# Patient Record
Sex: Male | Born: 1935 | Race: Black or African American | Hispanic: No | Smoking: Never smoker
Health system: Southern US, Community
[De-identification: ages and names within clinical notes are randomized; demographics above are authoritative.]

## PROBLEM LIST (undated history)

## (undated) DIAGNOSIS — E785 Hyperlipidemia, unspecified: Secondary | ICD-10-CM

## (undated) DIAGNOSIS — E119 Type 2 diabetes mellitus without complications: Secondary | ICD-10-CM

## (undated) DIAGNOSIS — I639 Cerebral infarction, unspecified: Secondary | ICD-10-CM

## (undated) DIAGNOSIS — H5440 Blindness, one eye, unspecified eye: Secondary | ICD-10-CM

---

## 1997-08-16 ENCOUNTER — Emergency Department (HOSPITAL_COMMUNITY): Admission: EM | Admit: 1997-08-16 | Discharge: 1997-08-16 | Payer: Self-pay | Admitting: Emergency Medicine

## 1997-08-24 ENCOUNTER — Emergency Department (HOSPITAL_COMMUNITY): Admission: EM | Admit: 1997-08-24 | Discharge: 1997-08-24 | Payer: Self-pay | Admitting: Emergency Medicine

## 2005-07-19 ENCOUNTER — Emergency Department (HOSPITAL_COMMUNITY): Admission: EM | Admit: 2005-07-19 | Discharge: 2005-07-19 | Payer: Self-pay | Admitting: Emergency Medicine

## 2009-02-07 ENCOUNTER — Ambulatory Visit: Payer: Self-pay | Admitting: Vascular Surgery

## 2009-02-07 ENCOUNTER — Inpatient Hospital Stay (HOSPITAL_COMMUNITY): Admission: EM | Admit: 2009-02-07 | Discharge: 2009-02-09 | Payer: Self-pay | Admitting: Emergency Medicine

## 2009-02-07 ENCOUNTER — Encounter (INDEPENDENT_AMBULATORY_CARE_PROVIDER_SITE_OTHER): Payer: Self-pay | Admitting: Internal Medicine

## 2010-04-01 LAB — URINALYSIS, ROUTINE W REFLEX MICROSCOPIC
Bilirubin Urine: NEGATIVE
Hgb urine dipstick: NEGATIVE
Nitrite: NEGATIVE
Protein, ur: NEGATIVE mg/dL
Specific Gravity, Urine: 1.016 (ref 1.005–1.030)
Urobilinogen, UA: 1 mg/dL (ref 0.0–1.0)

## 2010-04-01 LAB — CARDIAC PANEL(CRET KIN+CKTOT+MB+TROPI)
Relative Index: INVALID (ref 0.0–2.5)
Relative Index: INVALID (ref 0.0–2.5)
Troponin I: 0.01 ng/mL (ref 0.00–0.06)
Troponin I: 0.01 ng/mL (ref 0.00–0.06)

## 2010-04-01 LAB — POCT CARDIAC MARKERS: Myoglobin, poc: 98.9 ng/mL (ref 12–200)

## 2010-04-01 LAB — GLUCOSE, CAPILLARY
Glucose-Capillary: 114 mg/dL — ABNORMAL HIGH (ref 70–99)
Glucose-Capillary: 139 mg/dL — ABNORMAL HIGH (ref 70–99)
Glucose-Capillary: 143 mg/dL — ABNORMAL HIGH (ref 70–99)
Glucose-Capillary: 149 mg/dL — ABNORMAL HIGH (ref 70–99)
Glucose-Capillary: 162 mg/dL — ABNORMAL HIGH (ref 70–99)
Glucose-Capillary: 164 mg/dL — ABNORMAL HIGH (ref 70–99)

## 2010-04-01 LAB — LIPID PANEL
Cholesterol: 150 mg/dL (ref 0–200)
HDL: 32 mg/dL — ABNORMAL LOW (ref >39)
LDL Cholesterol: 99 mg/dL (ref 0–99)
Total CHOL/HDL Ratio: 4.5 RATIO
Total CHOL/HDL Ratio: 4.6 RATIO
Triglycerides: 79 mg/dL (ref <150)
VLDL: 16 mg/dL (ref 0–40)

## 2010-04-01 LAB — DIFFERENTIAL
Basophils Absolute: 0 10*3/uL (ref 0.0–0.1)
Basophils Relative: 0 % (ref 0–1)
Eosinophils Relative: 1 % (ref 0–5)
Monocytes Absolute: 0.4 10*3/uL (ref 0.1–1.0)
Monocytes Relative: 10 % (ref 3–12)
Neutro Abs: 3.3 10*3/uL (ref 1.7–7.7)

## 2010-04-01 LAB — CBC
HCT: 43.6 % (ref 39.0–52.0)
Hemoglobin: 14.6 g/dL (ref 13.0–17.0)
RBC: 5.16 MIL/uL (ref 4.22–5.81)
RDW: 14.2 % (ref 11.5–15.5)
WBC: 4.6 10*3/uL (ref 4.0–10.5)

## 2010-04-01 LAB — HEMOGLOBIN A1C: Mean Plasma Glucose: 163 mg/dL

## 2010-04-01 LAB — BASIC METABOLIC PANEL
Chloride: 101 mEq/L (ref 96–112)
GFR calc Af Amer: >60 mL/min (ref >60)
Potassium: 4.2 mEq/L (ref 3.5–5.1)

## 2010-04-01 LAB — TSH: TSH: 0.851 u[IU]/mL (ref 0.350–4.500)

## 2010-10-25 ENCOUNTER — Other Ambulatory Visit: Payer: Self-pay | Admitting: Emergency Medicine

## 2010-10-25 DIAGNOSIS — R609 Edema, unspecified: Secondary | ICD-10-CM

## 2010-10-26 ENCOUNTER — Ambulatory Visit
Admission: RE | Admit: 2010-10-26 | Discharge: 2010-10-26 | Disposition: A | Discharge status: Home / Self Care | Payer: Medicare Other | Source: Ambulatory Visit | Attending: Emergency Medicine | Admitting: Emergency Medicine

## 2010-10-26 DIAGNOSIS — R609 Edema, unspecified: Secondary | ICD-10-CM

## 2010-10-29 ENCOUNTER — Other Ambulatory Visit: Payer: Self-pay

## 2010-10-30 ENCOUNTER — Ambulatory Visit
Admission: RE | Admit: 2010-10-30 | Discharge: 2010-10-30 | Disposition: A | Discharge status: Home / Self Care | Payer: Medicare Other | Source: Ambulatory Visit | Attending: Emergency Medicine | Admitting: Emergency Medicine

## 2010-10-30 DIAGNOSIS — R609 Edema, unspecified: Secondary | ICD-10-CM

## 2011-03-26 ENCOUNTER — Ambulatory Visit (INDEPENDENT_AMBULATORY_CARE_PROVIDER_SITE_OTHER): Payer: Medicare Other | Admitting: Emergency Medicine

## 2011-03-26 ENCOUNTER — Encounter: Payer: Self-pay | Admitting: Emergency Medicine

## 2011-03-26 DIAGNOSIS — G629 Polyneuropathy, unspecified: Secondary | ICD-10-CM

## 2011-03-26 DIAGNOSIS — E785 Hyperlipidemia, unspecified: Secondary | ICD-10-CM

## 2011-03-26 DIAGNOSIS — E782 Mixed hyperlipidemia: Secondary | ICD-10-CM

## 2011-03-26 DIAGNOSIS — E119 Type 2 diabetes mellitus without complications: Secondary | ICD-10-CM

## 2011-03-26 DIAGNOSIS — I639 Cerebral infarction, unspecified: Secondary | ICD-10-CM | POA: Insufficient documentation

## 2011-03-26 DIAGNOSIS — R269 Unspecified abnormalities of gait and mobility: Secondary | ICD-10-CM

## 2011-03-26 DIAGNOSIS — I10 Essential (primary) hypertension: Secondary | ICD-10-CM

## 2011-03-26 LAB — GLUCOSE, POCT (MANUAL RESULT ENTRY): POC Glucose: 132

## 2011-03-26 NOTE — Progress Notes (Signed)
  Subjective:    Patient ID: Curtis Brock, male    DOB: 07/03/1925, 78 y.o.   MRN: [id number]  HPI patient to followup on his diabetes. He also has high blood pressure and high cholesterol. He has had a previous mini stroke in the past. He does have difficulty with gait and he walks with a cane.    Review of Systems  Constitutional: Negative.   Eyes:       He is under the care of ophthalmologist for his eyes. He is extremely poor vision.  Respiratory: Negative for cough, chest tightness and shortness of breath.   Cardiovascular: Positive for leg swelling. Negative for chest pain and palpitations.  Gastrointestinal: Negative.   Genitourinary: Negative.   Neurological:       He has a lot of difficulty with his gait. He is unsteady and uses a cane for support.       Objective:   Physical Exam  Eyes:       He has some swelling around the left eye and tries to keep his left eye closed.  Neck: No tracheal deviation present. No thyromegaly present.  Cardiovascular: Normal rate, normal heart sounds and intact distal pulses.  Exam reveals no gallop and no friction rub.   No murmur heard. Pulmonary/Chest: No respiratory distress. He has no wheezes. He has no rales.  Abdominal: He exhibits no distension. There is no tenderness.  Lymphadenopathy:    He has no cervical adenopathy.   eburnation of his feet reveals pulses to be 1+. He has decreased sensation of both feet consistent with neuropathy. There is no swelling noted.        Assessment & Plan:  Patient did relatively well regarding his diabetes. He is having significant problems but is followed by Dr. Zadie Rhine and a second specialist for his eye care. His hemoglobin A1c was 7 which is reasonable for him.

## 2011-04-03 ENCOUNTER — Telehealth: Payer: Self-pay

## 2011-04-03 NOTE — Telephone Encounter (Signed)
Merry Proud from [facility] would like for Dr Everlene Farrier to contact an Occupational therapist for a consult his family is requesting this ot consult please contact Merry Proud at [phone]

## 2011-04-03 NOTE — Telephone Encounter (Signed)
Pt just saw the [facility] therapist and [facility] [place]is called to state that she would like to talk with dr Everlene Farrier about referring patient to a neurologist and getting him an rx for a new pair of diabetic shoes

## 2011-04-04 NOTE — Telephone Encounter (Signed)
Dr. Everlene Farrier, would you like to order this?

## 2011-04-04 NOTE — Telephone Encounter (Signed)
.   please go ahead and make the referral.

## 2011-04-05 NOTE — Telephone Encounter (Signed)
Gave Curtis Brock/PT from [facility] verbal order for OT eval and treat. Dr Everlene Farrier, do you think pt could benefit from a neuro referral, and if so, can you please order referral (I can not do this in Epic). Also pt needs Rx for diabetic shoes.

## 2011-04-06 ENCOUNTER — Other Ambulatory Visit: Payer: Self-pay | Admitting: Emergency Medicine

## 2011-04-06 DIAGNOSIS — R269 Unspecified abnormalities of gait and mobility: Secondary | ICD-10-CM

## 2011-04-06 NOTE — Telephone Encounter (Signed)
I will put him and orders only for OT. And I will put in an order for a neuro evaluation. I do not know how to order his diabetic shoes.

## 2011-04-08 NOTE — Telephone Encounter (Signed)
Spoke with daughter, Cherene Julian, and told her we had done referrals for OT and neuro and asked her where pt was planning to get DM shoes. She stated that she didn't know that she was told we could write Rx and then tell her where we could get them. Called [facility] and asked how we can Rx shoes for pt. [facility] gave name of [facility] who said they just need a script faxed and they will call pt to set up appt. Faxed script to [facility] with pts phone numbers and [facility] for pt/daughter to [facility]

## 2011-04-09 NOTE — Telephone Encounter (Signed)
LMOM to CB on home and cell #s

## 2011-04-10 NOTE — Telephone Encounter (Signed)
Curtis Brock and I gave her info and phone number for [facility] so that she can call them.

## 2011-04-25 ENCOUNTER — Telehealth: Payer: Self-pay

## 2011-04-25 NOTE — Telephone Encounter (Unsigned)
Curtis Brock IS Joson'S DAUGHTER.  SHE IS WAITING ON A REFERRAL FOR A NEUROLOGIST.  SAYS SHE'S BEEN WAITING TWO WEEKS.  BEST NUMBER IS [id number].

## 2011-05-23 ENCOUNTER — Emergency Department (HOSPITAL_COMMUNITY)
Admission: EM | Admit: 2011-05-23 | Discharge: 2011-05-23 | Disposition: A | Discharge status: Home / Self Care | Payer: Medicare Other | Attending: Emergency Medicine | Admitting: Emergency Medicine

## 2011-05-23 DIAGNOSIS — R000 Tachycardia, unspecified: Secondary | ICD-10-CM | POA: Insufficient documentation

## 2011-05-23 DIAGNOSIS — R5381 Other malaise: Secondary | ICD-10-CM | POA: Insufficient documentation

## 2011-05-23 DIAGNOSIS — E860 Dehydration: Secondary | ICD-10-CM | POA: Insufficient documentation

## 2011-05-23 DIAGNOSIS — H5789 Other specified disorders of eye and adnexa: Secondary | ICD-10-CM | POA: Insufficient documentation

## 2011-05-23 DIAGNOSIS — R531 Weakness: Secondary | ICD-10-CM

## 2011-05-23 LAB — CBC
MCH: 27.7 pg (ref 26.0–34.0)
MCHC: 33.6 g/dL (ref 30.0–36.0)
Platelets: 190 10*3/uL (ref 150–400)

## 2011-05-23 LAB — BASIC METABOLIC PANEL
BUN: 10 mg/dL (ref 6–23)
Calcium: 9.4 mg/dL (ref 8.4–10.5)
GFR calc Af Amer: 69 mL/min — ABNORMAL LOW (ref >90)
GFR calc non Af Amer: 59 mL/min — ABNORMAL LOW (ref >90)
Potassium: 3.9 mEq/L (ref 3.5–5.1)
Sodium: 138 mEq/L (ref 135–145)

## 2011-05-23 LAB — DIFFERENTIAL
Basophils Relative: 0 % (ref 0–1)
Eosinophils Absolute: 0.1 10*3/uL (ref 0.0–0.7)
Monocytes Relative: 6 % (ref 3–12)
Neutrophils Relative %: 75 % (ref 43–77)

## 2011-05-23 LAB — URINALYSIS, ROUTINE W REFLEX MICROSCOPIC
Glucose, UA: NEGATIVE mg/dL
Hgb urine dipstick: NEGATIVE
Leukocytes, UA: NEGATIVE
pH: 5.5 (ref 5.0–8.0)

## 2011-05-23 LAB — POCT I-STAT TROPONIN I: Troponin i, poc: 0 ng/mL (ref 0.00–0.08)

## 2011-05-23 MED ORDER — SODIUM CHLORIDE 0.9 % IV BOLUS (SEPSIS)
1000.0000 mL | Freq: Once | INTRAVENOUS | Status: AC
  Administered 2011-05-23 (×2): 1000 mL via INTRAVENOUS

## 2011-05-23 NOTE — ED Notes (Signed)
Spoke with patient's daughter, [name] at [phone] at patient's request. Daughter advised her father is in the [facility] for near syncopal episode.

## 2011-05-23 NOTE — ED Notes (Signed)
Per EMS- patient was picked up [place]. Patient states no LOC and slumped over in car.and diaphortic Patient stated he was feeling weak and denied chest pain and denies SOB. Stroke scale negative. A& O x 4. BP 110/76 HR 108. 99% RA. CBG 177, 12 Lead showing sinus tach. No allergies.

## 2011-05-23 NOTE — ED Notes (Signed)
EDP advised I-Stat troponin results are 0.0.

## 2011-05-23 NOTE — ED Provider Notes (Signed)
History     CSN: [id number]  Arrival date & time 05/23/11  0904   First MD Initiated Contact with Patient 05/23/11 [phone]      Chief Complaint  Patient presents with  . Loss of Consciousness    (Consider location/radiation/quality/duration/timing/severity/associated sxs/prior treatment) HPI History provided by patient and EMS.  Pt reports that he took the bus downtown today and walked all the way down to the jail to bail out his son.  After leaving the jail (w/out his son), a security guard offered to drive him back to the bus station, but he suddenly became generally weak, and was unable to climb into the car.  Per EMS, the security guard helped him into the car and when they arrived, he was slumped over in the back seat.  Pt denies LOC.  He had no weakness prior to attempting to get into car, and feels that he has regained some of his strength since.  Denies CP, SOB, palpitations, dizziness, paresthesias.  No recent illness including fever, cough, N/V/D, urinary sx.  No new medication changes and has been compliant w/ meds.  Had not had anything to eat this morning.   No past medical history on file.  No past surgical history on file.  No family history on file.  History  Substance Use Topics  . Smoking status: Never Smoker   . Smokeless tobacco: Not on file  . Alcohol Use: Not on file      Review of Systems  All other systems reviewed and are negative.    Allergies  Review of patient's allergies indicates no known allergies.  Home Medications   Current Outpatient Rx  Name Route Sig Dispense Refill  . ASPIRIN EC 81 MG PO TBEC Oral Take 81 mg by mouth daily.    [name] BACITRACIN-POLYMYXIN B 500-10000 UNIT/GM OP OINT Left Eye Place 1 application into the left eye 3 (three) times daily. apply to eye every 12 hours while awake    . GABAPENTIN 100 MG PO CAPS Oral Take 200 mg by mouth 2 (two) times daily.     [name] LOSARTAN POTASSIUM 50 MG PO TABS Oral Take 50 mg by mouth daily.    [name]  METFORMIN HCL 500 MG PO TABS Oral Take 500 mg by mouth 2 (two) times daily with a meal.    . MOXIFLOXACIN HCL 0.5 % OP SOLN Both Eyes Place 1 drop into both eyes 4 (four) times daily.    [name] SIMVASTATIN 20 MG PO TABS Oral Take 20 mg by mouth every evening.    [name] SODIUM CHLORIDE (HYPERTONIC) 5 % OP SOLN Left Eye Place 1 drop into the left eye 4 (four) times daily.      BP 103/51  Pulse 104  Temp(Src) 97.5 F (36.4 C) (Oral)  Resp 16  SpO2 95%  Physical Exam  Nursing note and vitals reviewed. Constitutional: He is oriented to person, place, and time. He appears well-developed and well-nourished. No distress.  HENT:  Head: Normocephalic and atraumatic.  Mouth/Throat: Oropharynx is clear and moist.  Eyes:       Drainage from right eye  Neck: Normal range of motion.  Cardiovascular: Normal rate and regular rhythm.   Pulmonary/Chest: Effort normal and breath sounds normal. No respiratory distress.  Abdominal: Soft. Bowel sounds are normal. He exhibits no distension. There is no tenderness.  Genitourinary:       No CVA tenderness  Musculoskeletal: Normal range of motion.       No peripheral  edema or calf ttp.  Pt is able to pull himself up to sitting position and move all four extremities independently.  5/5 plantar flexion and grip strength.   Neurological: He is alert and oriented to person, place, and time.  Skin: Skin is warm and dry. No rash noted.       Sluggish cap refill  Psychiatric: He has a normal mood and affect. His behavior is normal.    ED Course  Procedures (including critical care time)   Date: 05/23/2011  Rate: 96   Rhythm: normal sinus rhythm  QRS Axis: normal  Intervals: normal  ST/T Wave abnormalities: normal  Conduction Disutrbances:none  Narrative Interpretation:   Old EKG Reviewed: no significant changes   Labs Reviewed  BASIC METABOLIC PANEL - Abnormal; Notable for the following:    Glucose, Bld 189 (*)    GFR calc non Af Amer 59 (*)    GFR calc Af  Amer 69 (*)    All other components within normal limits  URINALYSIS, ROUTINE W REFLEX MICROSCOPIC - Abnormal; Notable for the following:    Color, Urine AMBER (*) BIOCHEMICALS MAY BE AFFECTED BY COLOR   Bilirubin Urine SMALL (*)    Ketones, ur 15 (*)    All other components within normal limits  CBC  DIFFERENTIAL  POCT I-STAT TROPONIN I   No results found.   1. Dehydration   2. Weakness       MDM  78yo M presents w/ acute generalized weakness. Denies syncope and weakness is currently improved. On exam, A&O, afebrile, mildly tachycardic, orthostatic, no focal neuro deficits, full active ROM all extremities.  Pt receiving IV fluids.  He has eaten and continues to feel better.    Orthostatics rechecked after 2L and HR increases but BP does not drop.  Pt ambulated around ED w/ his cane.  Gait nml and pt denies weakness/lightheadedness.  D/c'd home.  Recommended oral hydration. Return precautions discussed.         Remer Macho, [place] 05/23/11 1553

## 2011-05-23 NOTE — Discharge Instructions (Signed)
Drink plenty of fluids and eat meals regularly when you return home.  You should return to the ER if you have a passing out spell or develop chest pain, shortness of breath or racing/skipping heart beat. Dehydration, Elderly Dehydration is when you lose more fluids from the body than you take in. Vital organs such as the kidneys, brain, and heart cannot function without a proper amount of fluids and salt. Any loss of fluids from the body can cause dehydration.  Older adults are at a higher risk of dehydration than younger adults. As we age, our bodies are less able to conserve water and do not respond to temperature changes as well. Also, older adults do not become thirsty as easily or quickly. Because of this, older adults often do not realize they need to increase fluids to avoid dehydration.  CAUSES   Vomiting.   Diarrhea.   Excessive sweating.   Excessive urine output.   Fever.  SYMPTOMS  Mild dehydration  Thirst.   Dry lips.   Slightly dry mouth.  Moderate dehydration  Very dry mouth.   Sunken eyes.   Skin does not bounce back quickly when lightly pinched and released.   Dark urine and decreased urine production.   Decreased tear production.   Headache.  Severe dehydration  Very dry mouth.   Extreme thirst.   Rapid, weak pulse (more than 100 beats per minute at rest).   Cold hands and feet.   Not able to sweat in spite of heat.   Rapid breathing.   Blue lips.   Confusion and lethargy.   Difficulty being awakened.   Minimal urine production.   No tears.  DIAGNOSIS  Your caregiver will diagnose dehydration based on your symptoms and your exam. Blood and urine tests will help confirm the diagnosis. The diagnostic evaluation should also identify the cause of dehydration. TREATMENT  Treatment of mild or moderate dehydration can often be done at home by increasing the amount of fluids that you drink. It is best to drink small amounts of fluid more often.  Drinking too much at one time can make vomiting worse.  Severe dehydration needs to be treated at the hospital where you will probably be given intravenous (IV) fluids that contain water and electrolytes. HOME CARE INSTRUCTIONS   Ask your caregiver about specific rehydration instructions.   Drink enough fluids to keep your urine clear or pale yellow.   Drink small amounts frequently if you have nausea and vomiting.   Eat as you normally do.   Avoid:   Foods or drinks high in sugar.   Carbonated drinks.   Juice.   Extremely hot or cold fluids.   Drinks with caffeine.   Fatty, greasy foods.   Alcohol.   Tobacco.   Overeating.   Gelatin desserts.   Wash your hands well to avoid spreading bacteria and viruses.   Only take over-the-counter or prescription medicines for pain, discomfort, or fever as directed by your caregiver.   Ask your caregiver if you should continue all prescribed and over-the-counter medicines.   Keep all follow-up appointments with your caregiver.  SEEK MEDICAL CARE IF:  You have abdominal pain and it increases or stays in one area (localizes).   You have a rash, stiff neck, or severe headache.   You are irritable, sleepy, or difficult to awaken.   You are weak, dizzy, or extremely thirsty.  SEEK IMMEDIATE MEDICAL CARE IF:   You are unable to keep fluids down, or you  get worse despite treatment.   You have frequent episodes of vomiting or diarrhea.   You have blood or green matter (bile) in your vomit.   You have blood in your stool or your stool looks black and tarry.   You have not urinated in 6 to 8 hours, or you have only urinated a small amount of very dark urine.   You have a fever.   You faint.  MAKE SURE YOU:   Understand these instructions.   Will watch your condition.   Will get help right away if you are not doing well or get worse.  Document Released: 03/23/2003 Document Revised: 12/20/2010 Document Reviewed:  08/20/2010 [facility] Patient Information 2012 [place].

## 2011-05-23 NOTE — ED Notes (Addendum)
[place] and started to feell weak and a security guard help him into a car and denies LOC, chest pain, SOB, N/V/F/D. Patient states he just feels weak. Patient does have white mucus in his right eye. Patient placed on monitor and sats of 97% RA. EKG captured on arrival to [facility]. Patient resting with NAD at this time.

## 2011-05-23 NOTE — ED Notes (Signed)
Discharge instructions reviewed with pt; verbalizes understanding.  No questions asked; no further c/o's voiced.  Pt to lobby via wheelchair.  Pt has family to assist him getting home.

## 2011-05-23 NOTE — ED Notes (Signed)
Patient is unable to urinate at this time 

## 2011-05-24 NOTE — ED Provider Notes (Signed)
Medical screening examination/treatment/procedure(s) were conducted as a shared visit with non-physician practitioner(s) and myself.  I personally evaluated the patient during the encounter  Generalized weakness without LOC.  No CP or SOB. No med changes, didn't eat today.  5/5 strength throughout, cranial nerves 3-12 intact.  Orthostatic by HR.  Ezequiel Essex, MD 05/24/11 [phone]

## 2011-07-23 ENCOUNTER — Encounter: Payer: Self-pay | Admitting: Emergency Medicine

## 2011-07-23 ENCOUNTER — Ambulatory Visit (INDEPENDENT_AMBULATORY_CARE_PROVIDER_SITE_OTHER): Payer: Medicare Other | Admitting: Emergency Medicine

## 2011-07-23 VITALS — BP 140/85 | HR 96 | Temp 97.4°F | Resp 16 | Ht 64.5 in | Wt 215.0 lb

## 2011-07-23 DIAGNOSIS — R972 Elevated prostate specific antigen [PSA]: Secondary | ICD-10-CM

## 2011-07-23 DIAGNOSIS — E119 Type 2 diabetes mellitus without complications: Secondary | ICD-10-CM

## 2011-07-23 DIAGNOSIS — R269 Unspecified abnormalities of gait and mobility: Secondary | ICD-10-CM

## 2011-07-23 DIAGNOSIS — I10 Essential (primary) hypertension: Secondary | ICD-10-CM

## 2011-07-23 DIAGNOSIS — E782 Mixed hyperlipidemia: Secondary | ICD-10-CM

## 2011-07-23 DIAGNOSIS — Z130 Encounter for screening for diseases of the blood and blood-forming organs and certain disorders involving the immune mechanism: Secondary | ICD-10-CM

## 2011-07-23 LAB — GLUCOSE, POCT (MANUAL RESULT ENTRY): POC Glucose: 139 mg/dl — AB (ref 70–99)

## 2011-07-23 LAB — POCT GLYCOSYLATED HEMOGLOBIN (HGB A1C): Hemoglobin A1C: 7

## 2011-07-23 NOTE — Progress Notes (Signed)
  Subjective:    Patient ID: Curtis Brock, male    DOB: Oct 23, 1925, 78 y.o.   MRN: [id number]  HPI patient in for followup of his diabetes. Apparently 2 weeks ago he decided to stop all of his medications. He also has been to the neurologist who evaluated and a CT scan has been ordered to rule out normal pressure hydrocephalus. 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    Review of Systems     Objective:   Physical Exam patient is essentially blind on eye exam. His neck is supple chest is clear. Cardiac exam is unremarkable. Examination of his feet reveal no lesions. He has an ataxic gait and walks wide-based with a walker   Results for orders placed in visit on 07/23/11  GLUCOSE, POCT (MANUAL RESULT ENTRY)      Component Value Range   POC Glucose 139 (*) 70 - 99 mg/dl  POCT GLYCOSYLATED HEMOGLOBIN (HGB A1C)      Component Value Range   Hemoglobin A1C 7.0         Assessment & Plan:  Patient has stopped all his medications and I encouraged him to restart his medications to protect his kidneys and keep his blood pressure under good control. We'll recheck again in 3 months. He is to have a CT scan done of the head to evaluate his gait disorder.

## 2011-07-24 ENCOUNTER — Other Ambulatory Visit: Payer: Self-pay | Admitting: Neurology

## 2011-07-24 DIAGNOSIS — R269 Unspecified abnormalities of gait and mobility: Secondary | ICD-10-CM

## 2011-07-24 DIAGNOSIS — R609 Edema, unspecified: Secondary | ICD-10-CM

## 2011-07-24 DIAGNOSIS — E1149 Type 2 diabetes mellitus with other diabetic neurological complication: Secondary | ICD-10-CM

## 2011-07-24 DIAGNOSIS — H548 Legal blindness, as defined in USA: Secondary | ICD-10-CM

## 2011-07-25 ENCOUNTER — Ambulatory Visit
Admission: RE | Admit: 2011-07-25 | Discharge: 2011-07-25 | Disposition: A | Discharge status: Home / Self Care | Payer: Medicare Other | Source: Ambulatory Visit | Attending: Neurology | Admitting: Neurology

## 2011-07-25 DIAGNOSIS — H548 Legal blindness, as defined in USA: Secondary | ICD-10-CM

## 2011-07-25 DIAGNOSIS — R609 Edema, unspecified: Secondary | ICD-10-CM

## 2011-07-25 DIAGNOSIS — E1149 Type 2 diabetes mellitus with other diabetic neurological complication: Secondary | ICD-10-CM

## 2011-07-25 DIAGNOSIS — R269 Unspecified abnormalities of gait and mobility: Secondary | ICD-10-CM

## 2011-07-29 ENCOUNTER — Telehealth: Payer: Self-pay

## 2011-07-29 NOTE — Telephone Encounter (Signed)
After checking w/[facility] and in Dr Perfecto [place] box, called and LMOM that we do not have a form from them for pt and asked for refax if needed.

## 2011-07-29 NOTE — Telephone Encounter (Signed)
[place] SHE HAD FAXED OVER ORDERS TO BE SIGNED AND RETURNED BY DR DAUB AND RETURN PLEASE CALL [phone] [phone] REALLY NEED TO HAVE BACK ASAP

## 2011-08-08 ENCOUNTER — Encounter: Payer: Self-pay | Admitting: Family Medicine

## 2011-11-10 ENCOUNTER — Encounter (HOSPITAL_COMMUNITY): Payer: Self-pay | Admitting: Physical Medicine and Rehabilitation

## 2011-11-10 ENCOUNTER — Emergency Department (HOSPITAL_COMMUNITY)
Admission: EM | Admit: 2011-11-10 | Discharge: 2011-11-10 | Disposition: A | Discharge status: Home / Self Care | Payer: Medicare Other | Attending: Emergency Medicine | Admitting: Emergency Medicine

## 2011-11-10 DIAGNOSIS — Z7982 Long term (current) use of aspirin: Secondary | ICD-10-CM | POA: Insufficient documentation

## 2011-11-10 DIAGNOSIS — R5381 Other malaise: Secondary | ICD-10-CM | POA: Insufficient documentation

## 2011-11-10 DIAGNOSIS — R531 Weakness: Secondary | ICD-10-CM

## 2011-11-10 DIAGNOSIS — Z79899 Other long term (current) drug therapy: Secondary | ICD-10-CM | POA: Insufficient documentation

## 2011-11-10 DIAGNOSIS — R5383 Other fatigue: Secondary | ICD-10-CM | POA: Insufficient documentation

## 2011-11-10 DIAGNOSIS — E119 Type 2 diabetes mellitus without complications: Secondary | ICD-10-CM | POA: Insufficient documentation

## 2011-11-10 HISTORY — DX: Type 2 diabetes mellitus without complications: E11.9

## 2011-11-10 LAB — URINALYSIS, ROUTINE W REFLEX MICROSCOPIC
Glucose, UA: 100 mg/dL — AB
Ketones, ur: NEGATIVE mg/dL
Leukocytes, UA: NEGATIVE
Nitrite: NEGATIVE
Specific Gravity, Urine: 1.026 (ref 1.005–1.030)
pH: 5 (ref 5.0–8.0)

## 2011-11-10 LAB — BASIC METABOLIC PANEL
CO2: 27 mEq/L (ref 19–32)
Chloride: 100 mEq/L (ref 96–112)
GFR calc Af Amer: 76 mL/min — ABNORMAL LOW (ref >90)
Potassium: 3.9 mEq/L (ref 3.5–5.1)
Sodium: 137 mEq/L (ref 135–145)

## 2011-11-10 LAB — CBC WITH DIFFERENTIAL/PLATELET
Basophils Absolute: 0 10*3/uL (ref 0.0–0.1)
Eosinophils Relative: 1 % (ref 0–5)
HCT: 39.5 % (ref 39.0–52.0)
Lymphocytes Relative: 15 % (ref 12–46)
Lymphs Abs: 0.9 10*3/uL (ref 0.7–4.0)
MCV: 82.8 fL (ref 78.0–100.0)
Monocytes Absolute: 0.4 10*3/uL (ref 0.1–1.0)
Neutro Abs: 4.9 10*3/uL (ref 1.7–7.7)
RBC: 4.77 MIL/uL (ref 4.22–5.81)
RDW: 13 % (ref 11.5–15.5)
WBC: 6.4 10*3/uL (ref 4.0–10.5)

## 2011-11-10 NOTE — ED Provider Notes (Signed)
History     CSN: [id number]  Arrival date & time 11/10/11  1034   First MD Initiated Contact with Patient 11/10/11 1040      Chief Complaint  Patient presents with  . Loss of Consciousness    (Consider location/radiation/quality/duration/timing/severity/associated sxs/prior treatment) The history is provided by the patient.   78 year old, male, presents emergency department complaining of an episode of weakness.  This morning.  He states that he lives with his son.  His son was helping him with his shower.  After, he got out of the shower, the patient became weak, and slumped over.  He did not fall to the ground.  He denies pain.  Prior to the weakness episode.  He denies palpitations of his heart.  He is asymptomatic now.  He denies any recent illnesses.  He, says he was normal.  Yesterday.  He has diabetes.  He has not changed his medications or missed any meals.  Past Medical History  Diagnosis Date  . Diabetes mellitus without complication     No past surgical history on file.  History reviewed. No pertinent family history.  History  Substance Use Topics  . Smoking status: Never Smoker   . Smokeless tobacco: Not on file  . Alcohol Use: No      Review of Systems  Constitutional: Negative for fever and chills.  HENT: Negative for neck pain.   Eyes: Negative for visual disturbance.  Respiratory: Negative for cough and shortness of breath.   Cardiovascular: Positive for syncope. Negative for chest pain and palpitations.  Gastrointestinal: Negative for nausea, vomiting, abdominal pain and diarrhea.  Genitourinary: Negative for dysuria.  Musculoskeletal: Negative for back pain.  Neurological: Positive for weakness. Negative for headaches.  Psychiatric/Behavioral: Negative for confusion.    Allergies  Review of patient's allergies indicates no known allergies.  Home Medications   Current Outpatient Rx  Name Route Sig Dispense Refill  . ASPIRIN EC 81 MG PO TBEC  Oral Take 81 mg by mouth daily.    [name] BACITRACIN-POLYMYXIN B 500-10000 UNIT/GM OP OINT Left Eye Place 1 application into the left eye 3 (three) times daily. apply to eye every 12 hours while awake    . GABAPENTIN 100 MG PO CAPS Oral Take 200 mg by mouth 2 (two) times daily.     [name] LOSARTAN POTASSIUM 50 MG PO TABS Oral Take 50 mg by mouth daily.    [name] METFORMIN HCL 500 MG PO TABS Oral Take 500 mg by mouth 2 (two) times daily with a meal.    . MOXIFLOXACIN HCL 0.5 % OP SOLN Both Eyes Place 1 drop into both eyes 4 (four) times daily.    [name] SIMVASTATIN 20 MG PO TABS Oral Take 20 mg by mouth every evening.    [name] SODIUM CHLORIDE (HYPERTONIC) 5 % OP SOLN Left Eye Place 1 drop into the left eye 4 (four) times daily.      BP 108/48  Pulse 84  Temp 98 F (36.7 C) (Oral)  Resp 16  SpO2 96%  Physical Exam  Nursing note and vitals reviewed. Constitutional: He is oriented to person, place, and time. He appears well-developed and well-nourished. No distress.  HENT:  Head: Normocephalic and atraumatic.  Eyes: Conjunctivae normal and EOM are normal. Pupils are equal, round, and reactive to light.  Neck: Normal range of motion. Neck supple. No JVD present.       No carotid bruits  Cardiovascular: Normal rate, regular rhythm and intact distal pulses.  Pulmonary/Chest: Effort normal and breath sounds normal. No respiratory distress.  Abdominal: Soft. Bowel sounds are normal. He exhibits no distension.  Musculoskeletal: Normal range of motion. He exhibits no edema.  Neurological: He is alert and oriented to person, place, and time. He has normal strength. No cranial nerve deficit or sensory deficit. He displays a negative Romberg sign. Coordination normal. GCS eye subscore is 4. GCS verbal subscore is 5. GCS motor subscore is 6.  Skin: Skin is warm and dry.  Psychiatric: He has a normal mood and affect. Thought content normal.    ED Course  Procedures (including critical care time)  Labs Reviewed  BASIC  METABOLIC PANEL - Abnormal; Notable for the following:    Glucose, Bld 213 (*)     GFR calc non Af Amer 65 (*)     GFR calc Af Amer 76 (*)     All other components within normal limits  CBC WITH DIFFERENTIAL  URINALYSIS, ROUTINE W REFLEX MICROSCOPIC   No results found.   No diagnosis found.  ECG. Normal sinus rhythm at 94 beats per minute. Normal axis. Normal intervals. Poor R wave progression. Nonspecific T-wave changes  MDM  Weakness, which has resolved, with no neurological deficits.  Patient has normal mental status, and no signs of systemic illness        Barbara Cower, MD 11/10/11 1316

## 2011-11-10 NOTE — ED Notes (Signed)
Pt discharged home with family member. Had no further questions.

## 2011-11-10 NOTE — ED Notes (Signed)
Pt undressed, in gown, on monitor, continuous pulse oximetry and blood pressure cuff 

## 2011-11-10 NOTE — ED Notes (Signed)
Pt presents to department for evaluation of syncope. Pt states he passed out while sitting in chair after shower. States he does not remember exact events. Able to move all extremities. Pt is conscious alert and oriented x4. Denies dizziness at the time. States chronic back pain.

## 2011-11-10 NOTE — ED Notes (Signed)
Pt presents to department via [facility] for evaluation of syncope. Per daughter patient passed out while sitting in chair after shower, episode lasted approximately 2 minutes. Pt is conscious alert and oriented x4 upon arrival. CBG 196. History of diabetes, but has not been taking medications x2 months. Denies pain at the time.

## 2011-11-12 ENCOUNTER — Encounter: Payer: Self-pay | Admitting: Emergency Medicine

## 2011-11-12 ENCOUNTER — Ambulatory Visit (INDEPENDENT_AMBULATORY_CARE_PROVIDER_SITE_OTHER): Payer: Medicare Other | Admitting: Emergency Medicine

## 2011-11-12 VITALS — BP 118/74 | HR 91 | Temp 97.2°F | Resp 16 | Ht 65.0 in | Wt 203.4 lb

## 2011-11-12 DIAGNOSIS — E119 Type 2 diabetes mellitus without complications: Secondary | ICD-10-CM

## 2011-11-12 DIAGNOSIS — R7989 Other specified abnormal findings of blood chemistry: Secondary | ICD-10-CM

## 2011-11-12 DIAGNOSIS — H547 Unspecified visual loss: Secondary | ICD-10-CM

## 2011-11-12 DIAGNOSIS — Z23 Encounter for immunization: Secondary | ICD-10-CM

## 2011-11-12 DIAGNOSIS — H543 Unqualified visual loss, both eyes: Secondary | ICD-10-CM

## 2011-11-12 LAB — POCT GLYCOSYLATED HEMOGLOBIN (HGB A1C): Hemoglobin A1C: 6.7

## 2011-11-12 MED ORDER — GABAPENTIN 100 MG PO CAPS: 200.0000 mg | ORAL_CAPSULE | Freq: Two times a day (BID) | ORAL | Status: DC

## 2011-11-12 MED ORDER — SIMVASTATIN 20 MG PO TABS: 20.0000 mg | ORAL_TABLET | Freq: Every evening | ORAL | Status: DC

## 2011-11-12 MED ORDER — METFORMIN HCL 500 MG PO TABS: 500.0000 mg | ORAL_TABLET | Freq: Two times a day (BID) | ORAL | Status: DC

## 2011-11-12 MED ORDER — LOSARTAN POTASSIUM 50 MG PO TABS: 50.0000 mg | ORAL_TABLET | Freq: Every day | ORAL | Status: DC

## 2011-11-12 NOTE — Progress Notes (Signed)
  Subjective:    Patient ID: Curtis Brock, male    DOB: 1925-05-02, 78 y.o.   MRN: [id number]  HPI patient in for followup of his diabetes and neuropathy related to his diabetes. He is blind in his left eye and has minimal vision in his right. It his son does not go by and check on him during the day he does not get out of bed. At times he does not take his medication. He was seen in the emergency room on Sunday labs done revealed mild elevation in his LFTs along with an elevated sugar. The son is requesting home health to come to the home to help with the general care and the patient's ADLs in that he is not able to bathe himself or take care of himself.    Review of Systems     Objective:   Physical Exam physical exam of this patient to be blind in his left eye with minimal vision on the right. Clear to auscultation and percussion. His cardiac exam regular rate and rhythm. There is a Candida-type rash involving his lower abdomen just beneath the panniculus. Examination of the lower extremities reveals no tissue breakdown on his legs.        Assessment & Plan:  Patient was given a flu shot. He was encouraged to take his medications regularly. We'll attempt to get home health to go out and help with ADLs and also physical therapy to help with his walking and improve his core muscle strength. The patient's hemoglobin A1c is 6.7. The son states he has not taken his medicine anyway so we'll go ahead and stop the Glucophage due to his age

## 2011-12-05 ENCOUNTER — Telehealth: Payer: Self-pay

## 2011-12-05 NOTE — Telephone Encounter (Signed)
Thanks, noted sent to Dr Lonia Mad

## 2011-12-05 NOTE — Telephone Encounter (Signed)
TAMMY A NURSE FROM [facility] WANTED [place] TO KNOW THAT PT FELL, HE DIDN'T HURT HIMSELF BUT THE PROTOCOL FOR THEM IS TO LET THE DR KNOW IT WAS ON Tuesday. PLEASE CALL [phone] IF NEEDED

## 2011-12-10 ENCOUNTER — Telehealth: Payer: Self-pay | Admitting: Radiology

## 2011-12-10 NOTE — Telephone Encounter (Signed)
I spoke to the patients physical therapist. Patients son is reporting frequent falls for patient.  Son wants him placed in a facility. He has been advised by home health agency this can only be done following a hospital admission. PT indicates he is doing well.  He is getting home PT, the therapist needs orders to continue PT. I have advised patient needs to be seen, esp since he has been falling.

## 2011-12-11 ENCOUNTER — Ambulatory Visit (INDEPENDENT_AMBULATORY_CARE_PROVIDER_SITE_OTHER): Payer: Medicare Other | Admitting: Emergency Medicine

## 2011-12-11 VITALS — BP 118/74 | HR 72 | Temp 98.0°F | Resp 18 | Ht 65.0 in | Wt 212.0 lb

## 2011-12-11 DIAGNOSIS — G629 Polyneuropathy, unspecified: Secondary | ICD-10-CM | POA: Insufficient documentation

## 2011-12-11 DIAGNOSIS — H5440 Blindness, one eye, unspecified eye: Secondary | ICD-10-CM | POA: Insufficient documentation

## 2011-12-11 DIAGNOSIS — E119 Type 2 diabetes mellitus without complications: Secondary | ICD-10-CM

## 2011-12-11 DIAGNOSIS — G589 Mononeuropathy, unspecified: Secondary | ICD-10-CM

## 2011-12-11 DIAGNOSIS — I6350 Cerebral infarction due to unspecified occlusion or stenosis of unspecified cerebral artery: Secondary | ICD-10-CM

## 2011-12-11 DIAGNOSIS — I639 Cerebral infarction, unspecified: Secondary | ICD-10-CM

## 2011-12-11 DIAGNOSIS — R269 Unspecified abnormalities of gait and mobility: Secondary | ICD-10-CM

## 2011-12-11 NOTE — Progress Notes (Signed)
  Subjective:    Patient ID: Curtis Brock, male    DOB: [birth year], 78 y.o.   MRN: [id number]  HPI patient hasn't been receiving physical therapy OT nursing care and home health care home. He is having significant difficulty with his walking he is blind in his left eye with severe visual problems in his right eye. His main problem has been significant difficulty with his gait. He is been previously evaluated by [facility].    Review of Systems     Objective:   Physical Exam patient is blind in his left eye. He is marked decreased visual acuity in the right. Cardiac exam has a regular rate and rhythm. Examination of lower extremities reveals no reflexes in the knees or ankles. He has significant decreased vibratory sense and position sense which seems to be worse on the left compared to the right. He walks with a shuffling gait needs the assistance of a cane        Assessment & Plan:  Patient has diabetes mellitus with significant peripheral neuropathy. He is status post CVA. He is blind in his left eye and nearly blind in his right eye. He is a high risk of falls. We will continue his physical therapy at home for the next 6-8 weeks. I did not stop his physical therapy and send him to rehabilitation because if that happened all of his home health assistance would be stopped. The family is going to look into care facilities and discuss this in the near future.

## 2011-12-16 ENCOUNTER — Telehealth: Payer: Self-pay

## 2011-12-16 NOTE — Telephone Encounter (Signed)
TIFFANY WOULD LIKE TO SPEAK WITH SOMEONE REGARDING Curtis Brock. [place] [phone]

## 2011-12-17 NOTE — Telephone Encounter (Signed)
Called Tiffany back and she was asking for verbal order to continue home health aide for 2 more weeks, and also 2 more nursing visits for add'l DM teaching. Per Dr Perfecto Kingdom OV note that he wanted home health services to cont for pt, I gave verbal order for both.

## 2012-01-27 ENCOUNTER — Telehealth: Payer: Self-pay

## 2012-01-27 NOTE — Telephone Encounter (Signed)
AKIKO FROM [facility] WOULD LIKE TO SPEAK WITH DR DAUB'S NURSE REGARDING PT [place] [phone]

## 2012-01-27 NOTE — Telephone Encounter (Signed)
It sounds like this is just a superficial injury, but if pt or caregivers have any concern, we would be happy to evaluate

## 2012-01-27 NOTE — Telephone Encounter (Signed)
Called Curtis Brock and she advised patient fell and has an abrasion of his left temple, some swelling of this area. Vitals okay patient does not exhibit changes from baseline, seems okay. No other symptoms. Nurse just wants to make sure nothing else needs to be done regarding this incident. Please advise.

## 2012-01-28 NOTE — Telephone Encounter (Signed)
Thanks, called Akiko to advise.

## 2012-02-11 ENCOUNTER — Ambulatory Visit (INDEPENDENT_AMBULATORY_CARE_PROVIDER_SITE_OTHER): Payer: Medicare Other | Admitting: Emergency Medicine

## 2012-02-11 VITALS — BP 110/63 | HR 87 | Temp 97.4°F | Resp 16 | Ht 65.0 in | Wt 217.0 lb

## 2012-02-11 DIAGNOSIS — E119 Type 2 diabetes mellitus without complications: Secondary | ICD-10-CM

## 2012-02-11 DIAGNOSIS — G5790 Unspecified mononeuropathy of unspecified lower limb: Secondary | ICD-10-CM

## 2012-02-11 DIAGNOSIS — R269 Unspecified abnormalities of gait and mobility: Secondary | ICD-10-CM

## 2012-02-11 DIAGNOSIS — I6789 Other cerebrovascular disease: Secondary | ICD-10-CM

## 2012-02-11 LAB — POCT GLYCOSYLATED HEMOGLOBIN (HGB A1C): Hemoglobin A1C: 6.6

## 2012-02-11 LAB — GLUCOSE, POCT (MANUAL RESULT ENTRY): POC Glucose: 151 mg/dl — AB (ref 70–99)

## 2012-02-11 NOTE — Progress Notes (Signed)
  Subjective:    Patient ID: Curtis Brock, male    DOB: [birth year], 79 y.o.   MRN: [id number]  HPI patient here to followup his diabetes. He is currently going to an eye specialist at [facility] and was to do surgery on his left cornea he was referred to Dr. Zadie Rhine in December 2012 because of the serious infection involving the left eye. 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 Apparently his time at physical therapy has ceased. He is not currently on medication for his diabetes he is controlling it with diet which he sometimes adheres to.   Review of Systems     Objective:   Physical Exam he has poor vision in his right eye. The pupil does not react well to light. Examination of left eye reveals significant corneal scarring. He cannot see out of that eye. His chest was clear. His cardiac exam is unremarkable. Neurologically he has decreased sensation in both feet he's pretty much wheelchair bound most of the time but he is able to walk with assistance. He is very wide based gait and needed assistance getting from the wheelchair into his car    Results for orders placed in visit on 02/11/12  GLUCOSE, POCT (MANUAL RESULT ENTRY)      Component Value Range   POC Glucose 151 (*) 70 - 99 mg/dl  POCT GLYCOSYLATED HEMOGLOBIN (HGB A1C)      Component Value Range   Hemoglobin A1C 6.6        Assessment & Plan:  His sugar seems okay off of medication. His hemoglobin A1c was 6.6 and I am okay with this. I'm going to be referring him back for physical therapy as I feel we could improve his gait disorder. I am somewhat hesitant about proceeding with corneal surgery because of lack of close followup here.

## 2012-02-13 ENCOUNTER — Other Ambulatory Visit: Payer: Self-pay | Admitting: Family Medicine

## 2012-02-13 ENCOUNTER — Telehealth: Payer: Self-pay | Admitting: Family Medicine

## 2012-02-13 DIAGNOSIS — Z742 Need for assistance at home and no other household member able to render care: Secondary | ICD-10-CM

## 2012-02-13 NOTE — Telephone Encounter (Signed)
Copies of recent records, problem list, and med list faxed to Dr. Vanetta Shawl @ [phone] per Dr. Perfecto Kingdom note. Referral request made to [facility] and routed to Marja Kays for follow up.

## 2012-02-13 NOTE — Telephone Encounter (Signed)
Butch Penny,  I made a referral to [facility], per Dr. Everlene Farrier, I think.[name] KitchenMarland KitchenI just wanted to send a note to you to be sure I did it right.  Thanks. Jacquenette Shone

## 2012-04-28 ENCOUNTER — Ambulatory Visit: Payer: Medicare Other | Admitting: Emergency Medicine

## 2012-05-09 ENCOUNTER — Emergency Department (HOSPITAL_COMMUNITY): Payer: Medicare Other

## 2012-05-09 ENCOUNTER — Telehealth: Payer: Self-pay

## 2012-05-09 ENCOUNTER — Observation Stay (HOSPITAL_COMMUNITY)
Admission: EM | Admit: 2012-05-09 | Discharge: 2012-05-12 | Disposition: A | Discharge status: Skilled Nursing Facility | Payer: Medicare Other | Attending: Family Medicine | Admitting: Family Medicine

## 2012-05-09 ENCOUNTER — Encounter (HOSPITAL_COMMUNITY): Payer: Self-pay | Admitting: *Deleted

## 2012-05-09 DIAGNOSIS — R531 Weakness: Secondary | ICD-10-CM

## 2012-05-09 DIAGNOSIS — M19029 Primary osteoarthritis, unspecified elbow: Secondary | ICD-10-CM | POA: Insufficient documentation

## 2012-05-09 DIAGNOSIS — H5440 Blindness, one eye, unspecified eye: Secondary | ICD-10-CM

## 2012-05-09 DIAGNOSIS — R5381 Other malaise: Principal | ICD-10-CM | POA: Insufficient documentation

## 2012-05-09 DIAGNOSIS — G319 Degenerative disease of nervous system, unspecified: Secondary | ICD-10-CM | POA: Insufficient documentation

## 2012-05-09 DIAGNOSIS — Z9181 History of falling: Secondary | ICD-10-CM | POA: Insufficient documentation

## 2012-05-09 DIAGNOSIS — R296 Repeated falls: Secondary | ICD-10-CM

## 2012-05-09 DIAGNOSIS — Z8673 Personal history of transient ischemic attack (TIA), and cerebral infarction without residual deficits: Secondary | ICD-10-CM | POA: Insufficient documentation

## 2012-05-09 DIAGNOSIS — M47814 Spondylosis without myelopathy or radiculopathy, thoracic region: Secondary | ICD-10-CM | POA: Insufficient documentation

## 2012-05-09 DIAGNOSIS — I6789 Other cerebrovascular disease: Secondary | ICD-10-CM | POA: Insufficient documentation

## 2012-05-09 DIAGNOSIS — E119 Type 2 diabetes mellitus without complications: Secondary | ICD-10-CM | POA: Insufficient documentation

## 2012-05-09 DIAGNOSIS — R5383 Other fatigue: Secondary | ICD-10-CM | POA: Insufficient documentation

## 2012-05-09 DIAGNOSIS — E785 Hyperlipidemia, unspecified: Secondary | ICD-10-CM

## 2012-05-09 DIAGNOSIS — R269 Unspecified abnormalities of gait and mobility: Secondary | ICD-10-CM | POA: Insufficient documentation

## 2012-05-09 DIAGNOSIS — G629 Polyneuropathy, unspecified: Secondary | ICD-10-CM | POA: Diagnosis present

## 2012-05-09 DIAGNOSIS — I639 Cerebral infarction, unspecified: Secondary | ICD-10-CM

## 2012-05-09 LAB — BASIC METABOLIC PANEL
BUN: 15 mg/dL (ref 6–23)
CO2: 27 mEq/L (ref 19–32)
Calcium: 9.2 mg/dL (ref 8.4–10.5)
Chloride: 101 mEq/L (ref 96–112)
Creatinine, Ser: 0.93 mg/dL (ref 0.50–1.35)
GFR calc Af Amer: 87 mL/min — ABNORMAL LOW (ref >90)
GFR calc non Af Amer: 75 mL/min — ABNORMAL LOW (ref >90)
Glucose, Bld: 158 mg/dL — ABNORMAL HIGH (ref 70–99)
Potassium: 4.1 mEq/L (ref 3.5–5.1)
Sodium: 136 mEq/L (ref 135–145)

## 2012-05-09 LAB — CK: Total CK: 223 U/L (ref 7–232)

## 2012-05-09 LAB — CBC WITH DIFFERENTIAL/PLATELET
Basophils Absolute: 0 10*3/uL (ref 0.0–0.1)
Basophils Relative: 0 % (ref 0–1)
Eosinophils Absolute: 0.1 10*3/uL (ref 0.0–0.7)
Eosinophils Relative: 1 % (ref 0–5)
HCT: 35.4 % — ABNORMAL LOW (ref 39.0–52.0)
Hemoglobin: 12.3 g/dL — ABNORMAL LOW (ref 13.0–17.0)
Lymphocytes Relative: 17 % (ref 12–46)
Lymphs Abs: 1.1 10*3/uL (ref 0.7–4.0)
MCH: 27.6 pg (ref 26.0–34.0)
MCHC: 34.7 g/dL (ref 30.0–36.0)
MCV: 79.4 fL (ref 78.0–100.0)
Monocytes Absolute: 0.7 10*3/uL (ref 0.1–1.0)
Monocytes Relative: 10 % (ref 3–12)
Neutro Abs: 4.6 10*3/uL (ref 1.7–7.7)
Neutrophils Relative %: 72 % (ref 43–77)
Platelets: 180 10*3/uL (ref 150–400)
RBC: 4.46 MIL/uL (ref 4.22–5.81)
RDW: 13.6 % (ref 11.5–15.5)
WBC: 6.5 10*3/uL (ref 4.0–10.5)

## 2012-05-09 LAB — TROPONIN I: Troponin I: <0.3 ng/mL (ref <0.30)

## 2012-05-09 LAB — URINE MICROSCOPIC-ADD ON

## 2012-05-09 LAB — URINALYSIS, ROUTINE W REFLEX MICROSCOPIC
Bilirubin Urine: NEGATIVE
Glucose, UA: NEGATIVE mg/dL
Hgb urine dipstick: NEGATIVE
Ketones, ur: NEGATIVE mg/dL
Nitrite: NEGATIVE
Protein, ur: NEGATIVE mg/dL
Specific Gravity, Urine: 1.015 (ref 1.005–1.030)
Urobilinogen, UA: 1 mg/dL (ref 0.0–1.0)
pH: 5 (ref 5.0–8.0)

## 2012-05-09 MED ORDER — ASPIRIN EC 81 MG PO TBEC
81.0000 mg | DELAYED_RELEASE_TABLET | Freq: Every day | ORAL | Status: DC
  Administered 2012-05-09 – 2012-05-12 (×4): 81 mg via ORAL
  Filled 2012-05-09 (×4): qty 1

## 2012-05-09 MED ORDER — SODIUM CHLORIDE (HYPERTONIC) 5 % OP SOLN
1.0000 [drp] | Freq: Four times a day (QID) | OPHTHALMIC | Status: DC
  Administered 2012-05-09 – 2012-05-10 (×4): 1 [drp] via OPHTHALMIC
  Filled 2012-05-09: qty 15

## 2012-05-09 MED ORDER — GATIFLOXACIN 0.5 % OP SOLN
1.0000 [drp] | Freq: Four times a day (QID) | OPHTHALMIC | Status: DC
  Administered 2012-05-09 – 2012-05-12 (×11): 1 [drp] via OPHTHALMIC
  Filled 2012-05-09: qty 2.5

## 2012-05-09 MED ORDER — HEPARIN SODIUM (PORCINE) 5000 UNIT/ML IJ SOLN
5000.0000 [IU] | Freq: Three times a day (TID) | INTRAMUSCULAR | Status: DC
  Administered 2012-05-09 – 2012-05-12 (×8): 5000 [IU] via SUBCUTANEOUS
  Filled 2012-05-09 (×12): qty 1

## 2012-05-09 MED ORDER — LOSARTAN POTASSIUM 50 MG PO TABS
50.0000 mg | ORAL_TABLET | Freq: Every day | ORAL | Status: DC
  Administered 2012-05-10 – 2012-05-12 (×3): 50 mg via ORAL
  Filled 2012-05-09 (×3): qty 1

## 2012-05-09 NOTE — ED Provider Notes (Signed)
History     CSN: [id number]  Arrival date & time 05/09/12  0716   First MD Initiated Contact with Patient 05/09/12 [phone]      Chief Complaint  Patient presents with  . Fall    (Consider location/radiation/quality/duration/timing/severity/associated sxs/prior treatment) HPI Lesion presents to the emergency department with a three-day history of increased, weakness, and falls.  Son states that over the last 3 days.  Patient's had difficulty with ambulation and falls whenever he tries to walk.  The son found, found the patient lying on the floor this morning.  The patient told the son that he had laid on the floor all night.  Patient denies to me chest pain, shortness of breath, headache, nausea, vomiting, or abdominal pain.  Son states that he may have struck his head.  Denies neck pain, as well. Past Medical History  Diagnosis Date  . Diabetes mellitus without complication     No past surgical history on file.  No family history on file.  History  Substance Use Topics  . Smoking status: Never Smoker   . Smokeless tobacco: Not on file  . Alcohol Use: No      Review of Systems Level 5 caveat applies due to dementia. Allergies  Review of patient's allergies indicates no known allergies.  Home Medications   Current Outpatient Rx  Name  Route  Sig  Dispense  Refill  . aspirin EC 81 MG tablet   Oral   Take 81 mg by mouth daily.         [name] gabapentin (NEURONTIN) 100 MG capsule   Oral   Take 2 capsules (200 mg total) by mouth 2 (two) times daily.   120 capsule   11   . losartan (COZAAR) 50 MG tablet   Oral   Take 1 tablet (50 mg total) by mouth daily.   30 tablet   11   . moxifloxacin (VIGAMOX) 0.5 % ophthalmic solution   Both Eyes   Place 1 drop into both eyes 4 (four) times daily.         . sodium chloride (MURO 128) 5 % ophthalmic solution   Left Eye   Place 1 drop into the left eye 4 (four) times daily.           BP 122/71  Pulse 83  Temp(Src) 98  F (36.7 C) (Rectal)  Resp 13  SpO2 100%  Physical Exam  Constitutional: He appears well-developed and well-nourished. No distress.  HENT:  Head: Normocephalic and atraumatic.  Mouth/Throat: Oropharynx is clear and moist.  Eyes: EOM are normal. Pupils are equal, round, and reactive to light.  Neck: Normal range of motion. Neck supple.  Abdominal: Soft. Bowel sounds are normal. He exhibits no distension. There is no tenderness.  Neurological: He is alert. No sensory deficit. He exhibits normal muscle tone. Coordination normal.  Skin: Skin is warm and dry. No rash noted. No erythema.    ED Course  Procedures (including critical care time)  Labs Reviewed  CBC WITH DIFFERENTIAL - Abnormal; Notable for the following:    Hemoglobin 12.3 (*)    HCT 35.4 (*)    All other components within normal limits  BASIC METABOLIC PANEL - Abnormal; Notable for the following:    Glucose, Bld 158 (*)    GFR calc non Af Amer 75 (*)    GFR calc Af Amer 87 (*)    All other components within normal limits  URINALYSIS, ROUTINE W REFLEX MICROSCOPIC - Abnormal;  Notable for the following:    Leukocytes, UA TRACE (*)    All other components within normal limits  TROPONIN I  CK  URINE MICROSCOPIC-ADD ON  CG4 I-STAT (LACTIC ACID)   Dg Chest 2 View  05/09/2012  *RADIOLOGY REPORT*  Clinical Data: Falls.  Diabetes.  CHEST - 2 VIEW  Comparison: 02/06/2009  Findings: Degenerative glenohumeral arthropathy observed bilaterally.  Minimal tortuosity of the thoracic aorta is observed. Heart size within normal limits.  Thoracic spondylosis is present.  There is linear subsegmental atelectasis along the right hemidiaphragm, along with mildly low lung volumes.  No pleural effusion observed.  IMPRESSION:  1.  Subsegmental atelectasis at the right lung base.  Mildly low lung volumes. 2.  Degenerative glenohumeral arthropathy bilaterally. 3.  Thoracic spondylosis. 4.  Minimal tortuosity of the thoracic aorta.   Original  Report Authenticated By: Van Clines, M.D.    Ct Head Wo Contrast  05/09/2012  *RADIOLOGY REPORT*  Clinical Data: Status post fall over multiple days  CT HEAD WITHOUT CONTRAST  Technique:  Contiguous axial images were obtained from the base of the skull through the vertex without contrast.  Comparison: 07/25/2011  Findings: There is diffuse patchy low density throughout the subcortical and periventricular white matter consistent with chronic small vessel ischemic change.  There is prominence of the sulci and ventricles consistent with brain atrophy.  There is no evidence for acute brain infarct, hemorrhage or mass.  The paranasal sinuses are clear.  The mastoid air cells are clear.  The skull appears intact.  IMPRESSION:  1.  No acute intracranial abnormalities. 2.  Small vessel ischemic change and brain atrophy.   Original Report Authenticated By: Kerby Moors, M.D.      1. Weakness     Patient will be admitted for weakness and falls.   MDM  MDM Reviewed: vitals, nursing note and previous chart Reviewed previous: labs Interpretation: labs, ECG, x-ray and CT scan Consults: admitting MD           Brent General, PA-C 05/10/12 1536

## 2012-05-09 NOTE — ED Notes (Signed)
Pt from home, c/o multiple fall over several days. Pt. Denies trauma, LOC, or pain. Pts son states he was found on the floor this morning.

## 2012-05-09 NOTE — Telephone Encounter (Signed)
PATIENT'S DAUGHTER (NOLA LEWIS) WOULD LIKE DR. DAUB TO CALL HER AS SOON AS POSSIBLE. HE HAS SEEN HER FATHER SEVERAL TIMES. HE KEEPS FALLING. HE FELL 3 TIMES THIS MORNING. HE IS AT  ER NOW AND HER DAUGHTER WOULD LIKE DR. DAUB TO CALL HER TO GIVE HER SOME HISTORY TO TELL THE ER DOCTORS. DR. Everlene Farrier TOLD HER HE WOULD PROBABLY BE FALLING A LOT DUE DUE HIS DIABETES. SHE WORKS [employer] AND HER FATHER TOLD HER HE HAD BEEN LAYING IN THE FLOOR ALL NIGHT. BEST PHONE [phone] (HOME)  OR [phone] (CELL)  (DAUGHTER'S NAME IS NOLA LEWIS)  MBC

## 2012-05-09 NOTE — Telephone Encounter (Signed)
Message left on daughter's answering machine that patient has had previous strokes and with his diabetes and poor vision he is prone to fall. We have tried to treat this by having physical therapy go out and work with the patient. He may need to be placed in a care facility when he leaves the hospital this visit.

## 2012-05-09 NOTE — H&P (Signed)
Family Medicine Teaching Service History and Physical: [phone]  CARTHEL JANNUSCH is an 79 y.o. male.   Chief Complaint: weakness and falls HPI:  79 yo male with h/o DM2, HLD, CVA, neuropathy and blindness in left eye who is brought by his son in law after he sustained a fall on the day of admission. Patient and son in law state that overnight, he got up and fell in his room, was unable to stand up and crawled to the stairway where he remained laying due to inability to stand back up. He remained there for a portion of the night and had a bowel movement and urination voluntarily because he could not get up to go to the bathroom. His son in law found him in the morning when he woke up. Patient denies loosing consciousness or hitting his head. He states that he just became weak in the legs and fell. His son in law states that this has been occuring every day, several times a day for the last week where he is unable to take a few steps before feeling weak in the legs and not being able to support himself. He normally walks with a cane and recently has not been able to ambulate even with the help of a walker.  Patient describes that he has been having some lower back pain in the last two weeks, intermittent, doesn't radiate, worst with walking, better with sitting. He denies any new numbness in the legs. He does have neuropathy at baseline for which he takes gabapentin, but this has overall not changed.  Overall, his son states that he has been a little off in the last 2 weeks, not remembering what the date is and being more forgetful. He denies any new change in medication, any fevers or chills. Denies any headache. Reports some occasional neck pain but not currently. No nausea, vomiting or abdominal pain. Has had normal appetite and eating normally.  No chest pain, reports mild shortness of breath with exertion.   Past Medical History  Diagnosis Date  . Diabetes mellitus without complication   h/o  CVA Neuropathy Blind left eye  No past surgical history on file.  No family history on file. Social History:  reports that he has never smoked. He does not have any smokeless tobacco history on file. He reports that he does not drink alcohol or use illicit drugs. He lives with his daughter and son in [place]   Allergies: No Known Allergies  Medications Prior to Admission  Medication Sig Dispense Refill  . aspirin EC 81 MG tablet Take 81 mg by mouth daily.      [name] gabapentin (NEURONTIN) 100 MG capsule Take 2 capsules (200 mg total) by mouth 2 (two) times daily.  120 capsule  11  . losartan (COZAAR) 50 MG tablet Take 1 tablet (50 mg total) by mouth daily.  30 tablet  11  . moxifloxacin (VIGAMOX) 0.5 % ophthalmic solution Place 1 drop into both eyes 4 (four) times daily.      . sodium chloride (MURO 128) 5 % ophthalmic solution Place 1 drop into the left eye 4 (four) times daily.        Results for orders placed during the hospital encounter of 05/09/12 (from the past 48 hour(s))  CBC WITH DIFFERENTIAL     Status: Abnormal   Collection Time    05/09/12  8:01 AM      Result Value Range   WBC 6.5  4.0 - 10.5  K/uL   RBC 4.46  4.22 - 5.81 MIL/uL   Hemoglobin 12.3 (*) 13.0 - 17.0 g/dL   HCT 35.4 (*) 39.0 - 52.0 %   MCV 79.4  78.0 - 100.0 fL   MCH 27.6  26.0 - 34.0 pg   MCHC 34.7  30.0 - 36.0 g/dL   RDW 13.6  11.5 - 15.5 %   Platelets 180  150 - 400 K/uL   Neutrophils Relative 72  43 - 77 %   Neutro Abs 4.6  1.7 - 7.7 K/uL   Lymphocytes Relative 17  12 - 46 %   Lymphs Abs 1.1  0.7 - 4.0 K/uL   Monocytes Relative 10  3 - 12 %   Monocytes Absolute 0.7  0.1 - 1.0 K/uL   Eosinophils Relative 1  0 - 5 %   Eosinophils Absolute 0.1  0.0 - 0.7 K/uL   Basophils Relative 0  0 - 1 %   Basophils Absolute 0.0  0.0 - 0.1 K/uL  BASIC METABOLIC PANEL     Status: Abnormal   Collection Time    05/09/12  8:01 AM      Result Value Range   Sodium 136  135 - 145 mEq/L   Potassium 4.1  3.5 - 5.1 mEq/L    Chloride 101  96 - 112 mEq/L   CO2 27  19 - 32 mEq/L   Glucose, Bld 158 (*) 70 - 99 mg/dL   BUN 15  6 - 23 mg/dL   Creatinine, Ser 0.93  0.50 - 1.35 mg/dL   Calcium 9.2  8.4 - 10.5 mg/dL   GFR calc non Af Amer 75 (*) >90 mL/min   GFR calc Af Amer 87 (*) >90 mL/min   Comment:            The eGFR has been calculated     using the CKD EPI equation.     This calculation has not been     validated in all clinical     situations.     eGFR's persistently     <90 mL/min signify     possible Chronic Kidney Disease.  TROPONIN I     Status: None   Collection Time    05/09/12  8:01 AM      Result Value Range   Troponin I <0.30  <0.30 ng/mL   Comment:            Due to the release kinetics of cTnI,     a negative result within the first hours     of the onset of symptoms does not rule out     myocardial infarction with certainty.     If myocardial infarction is still suspected,     repeat the test at appropriate intervals.  CK     Status: None   Collection Time    05/09/12  8:01 AM      Result Value Range   Total CK 223  7 - 232 U/L  CG4 I-STAT (LACTIC ACID)     Status: None   Collection Time    05/09/12  8:16 AM      Result Value Range   Lactic Acid, Venous 1.41  0.5 - 2.2 mmol/L  URINALYSIS, ROUTINE W REFLEX MICROSCOPIC     Status: Abnormal   Collection Time    05/09/12  9:11 AM      Result Value Range   Color, Urine YELLOW  YELLOW   APPearance CLEAR  CLEAR  Specific Gravity, Urine 1.015  1.005 - 1.030   pH 5.0  5.0 - 8.0   Glucose, UA NEGATIVE  NEGATIVE mg/dL   Hgb urine dipstick NEGATIVE  NEGATIVE   Bilirubin Urine NEGATIVE  NEGATIVE   Ketones, ur NEGATIVE  NEGATIVE mg/dL   Protein, ur NEGATIVE  NEGATIVE mg/dL   Urobilinogen, UA 1.0  0.0 - 1.0 mg/dL   Nitrite NEGATIVE  NEGATIVE   Leukocytes, UA TRACE (*) NEGATIVE  URINE MICROSCOPIC-ADD ON     Status: None   Collection Time    05/09/12  9:11 AM      Result Value Range   WBC, UA 0-2  <3 WBC/hpf   Bacteria, UA RARE   RARE   Urine-Other AMORPHOUS URATES/PHOSPHATES     Dg Chest 2 View  05/09/2012  *RADIOLOGY REPORT*  Clinical Data: Falls.  Diabetes.  CHEST - 2 VIEW  Comparison: 02/06/2009  Findings: Degenerative glenohumeral arthropathy observed bilaterally.  Minimal tortuosity of the thoracic aorta is observed. Heart size within normal limits.  Thoracic spondylosis is present.  There is linear subsegmental atelectasis along the right hemidiaphragm, along with mildly low lung volumes.  No pleural effusion observed.  IMPRESSION:  1.  Subsegmental atelectasis at the right lung base.  Mildly low lung volumes. 2.  Degenerative glenohumeral arthropathy bilaterally. 3.  Thoracic spondylosis. 4.  Minimal tortuosity of the thoracic aorta.   Original Report Authenticated By: Van Clines, M.D.    Ct Head Wo Contrast  05/09/2012  *RADIOLOGY REPORT*  Clinical Data: Status post fall over multiple days  CT HEAD WITHOUT CONTRAST  Technique:  Contiguous axial images were obtained from the base of the skull through the vertex without contrast.  Comparison: 07/25/2011  Findings: There is diffuse patchy low density throughout the subcortical and periventricular white matter consistent with chronic small vessel ischemic change.  There is prominence of the sulci and ventricles consistent with brain atrophy.  There is no evidence for acute brain infarct, hemorrhage or mass.  The paranasal sinuses are clear.  The mastoid air cells are clear.  The skull appears intact.  IMPRESSION:  1.  No acute intracranial abnormalities. 2.  Small vessel ischemic change and brain atrophy.   Original Report Authenticated By: Kerby Moors, M.D.     ROS Negative except per HPI Blood pressure 123/59, pulse 85, temperature 98 F (36.7 C), temperature source Rectal, resp. rate 13, SpO2 100.00%. Physical Exam  Physical Examination: General appearance - alert, in no acute distress, elderly gentleman sitting in bed Mental status - alert and oriented to  person and place but not time.  Eyes - cloudy, whitish left eye without reactive pupil, right eye with poorly reactive pupil, crusty discharge from left more than right  Nose - normal and patent, no erythema, discharge or polyps Mouth - mucous membranes moist, pharynx normal without lesions Neck - no tenderness to palpation along cervical spine, normal range of movement Chest - clear to auscultation, no wheezes, rales or rhonchi, symmetric air entry Heart - normal rate, regular rhythm, normal S1, S2, no murmurs, rubs, clicks or gallops Abdomen - soft, nontender, nondistended, no masses or organomegaly Back exam - no tenderness, palpable spasm or pain on motion. No reproducible tenderness with hip flexion  Neurological -  4/5 strength with knee flexion, extension and dorsiflexion and plantarflexion bilaterally, 4/5 grip strength bilaterally, 4+/5 biceps and triceps bilaterally No sensation to light touch in toes CN2-12 grossly intact except for vision and reactive pupils.  Finger to nose grossly  normal except for difficulty seeing target.  Patellar and achilles DTR difficult to illicit bilaterally.  Extremities - dorsalis pedis pulse difficult to palpate bilaterally, trace pre-tibial edema bilaterally  Assessment/Plan 79 yo male with h/o DM2, HLD, CVA, neuropathy and blindness in left eye who presents with 1 week history of unsteady gate and falls.   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 Recently however, there appears to be a component of weakness which seems new. The associated back pain makes spinal stenosis a possibility of the weakness. New ischemic stroke could also be a component.  Head CT showing small vessel ischemic changed and brain atrophy but no acute abnormality.  No evidence of UTI on UA.  Patient had low temperature on admission but repeat was normal. WBC and lactic acid are normal as well, making infectious cause of weakness less likely.  - check reversible organic causes like B12, TSH, RPR - consider lumbar MRI for evaluation of spinal stenosis - follow up brain MRI  - order PT and OT to evaluate patient.  - note from his PCP, Dr. Everlene Farrier from 05/09/12 mentions the possible need for a care facility.  - hold gabapentin in setting of weakness - if has another low or elevated BP, will obtain blood culture - CK was obtained in setting of long lie and risk for rhabdo and was normal   # DM: not on medication with A1C of 6.6 on 01/2012. Used to be on metformin which was stopped.  - repeat A1C - monitor glucose on BMP  # h/o CVA: on ASA and losartan. Unclear as to why not on a statin - continue ASA and losartan  # Blind left eye: referred to [facility] regarding surgery on left cornea.  - continue moxifloxacin ophthalmic solution.   Fen/Gi: diabetic diet PPx: heparin Dispo: admit to inpatient for further evaluation of weakness. Patient and son in law updated at bedside.   Liam Graham 05/09/2012, 3:52 PM PGY-2 Family Medicine Resident

## 2012-05-10 ENCOUNTER — Inpatient Hospital Stay (HOSPITAL_COMMUNITY): Payer: Medicare Other

## 2012-05-10 ENCOUNTER — Other Ambulatory Visit: Payer: Self-pay

## 2012-05-10 DIAGNOSIS — H5440 Blindness, one eye, unspecified eye: Secondary | ICD-10-CM

## 2012-05-10 DIAGNOSIS — G589 Mononeuropathy, unspecified: Secondary | ICD-10-CM

## 2012-05-10 LAB — COMPREHENSIVE METABOLIC PANEL
BUN: 11 mg/dL (ref 6–23)
CO2: 28 mEq/L (ref 19–32)
Calcium: 9 mg/dL (ref 8.4–10.5)
Creatinine, Ser: 0.8 mg/dL (ref 0.50–1.35)
GFR calc Af Amer: >90 mL/min (ref >90)
GFR calc non Af Amer: 80 mL/min — ABNORMAL LOW (ref >90)
Glucose, Bld: 107 mg/dL — ABNORMAL HIGH (ref 70–99)

## 2012-05-10 LAB — CBC
HCT: 35.5 % — ABNORMAL LOW (ref 39.0–52.0)
MCHC: 34.6 g/dL (ref 30.0–36.0)
RDW: 14 % (ref 11.5–15.5)

## 2012-05-10 LAB — HEMOGLOBIN A1C
Hgb A1c MFr Bld: 6.6 % — ABNORMAL HIGH (ref <5.7)
Mean Plasma Glucose: 143 mg/dL — ABNORMAL HIGH (ref <117)

## 2012-05-10 LAB — TSH: TSH: 2.489 u[IU]/mL (ref 0.350–4.500)

## 2012-05-10 LAB — RPR: RPR Ser Ql: NONREACTIVE

## 2012-05-10 LAB — GLUCOSE, CAPILLARY

## 2012-05-10 LAB — TROPONIN I: Troponin I: <0.3 ng/mL (ref <0.30)

## 2012-05-10 LAB — VITAMIN B12: Vitamin B-12: 168 pg/mL — ABNORMAL LOW (ref 211–911)

## 2012-05-10 MED ORDER — ACETAMINOPHEN 325 MG PO TABS
650.0000 mg | ORAL_TABLET | Freq: Four times a day (QID) | ORAL | Status: DC
  Administered 2012-05-10 – 2012-05-12 (×7): 650 mg via ORAL
  Filled 2012-05-10 (×7): qty 2

## 2012-05-10 MED ORDER — POLYVINYL ALCOHOL 1.4 % OP SOLN
1.0000 [drp] | Freq: Four times a day (QID) | OPHTHALMIC | Status: DC
  Administered 2012-05-11 – 2012-05-12 (×6): 1 [drp] via OPHTHALMIC
  Filled 2012-05-10 (×2): qty 15

## 2012-05-10 NOTE — Progress Notes (Signed)
I have seen and examined this patient. I have discussed with Dr Otis Dials.  I agree with their findings and plans as documented in their progress note. Will plan on 48 hours telemetry to look for malignant dysrhythmia.

## 2012-05-10 NOTE — ED Provider Notes (Signed)
Medical screening examination/treatment/procedure(s) were performed by non-physician practitioner and as supervising physician I was immediately available for consultation/collaboration.  Jasper Riling. Alvino Chapel, MD 05/10/12 1536

## 2012-05-10 NOTE — H&P (Signed)
I have seen and examined this patient. I have discussed with Dr Otis Dials.  I agree with their findings and plans as documented in their admission note. I spoke with patient and his son-in-law, who along with the patient's dgt, is a primary caretaker of patient.   Acute Issues  1. Falls, recurrent - patient found down - Patient dependent in ADLs of bathing, dressing, transfers - Patient unattended at home from 3 AM to [place] when both pt's dgt and son are working.  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  Possible Cognitive Impairment - Mini-Cog test: 0 out of 3 word recall at 3 minutes. Positive screen - Head CT on admission with No acute intracranial abnormalities.  Pt with Small vessel ischemic change and brain atrophy. - Plan - Check TSH, B12, RPR

## 2012-05-10 NOTE — Progress Notes (Signed)
FMTS Daily Intern Progress Note: pager: [phone]  Subjective:  Doing fine. Walked in hall with help. Requires two people to help him walk.  No low back pain today.   I have reviewed the patient's medications.  Objective Temp:  [97.9 F (36.6 C)-98.3 F (36.8 C)] 98.3 F (36.8 C) (04/27 0604) Pulse Rate:  [81-91] 91 (04/27 1000) Resp:  [18] 18 (04/27 0604) BP: (123-155)/(59-90) 138/74 mmHg (04/27 1000) SpO2:  [98 %-100 %] 98 % (04/27 0604)   Intake/Output Summary (Last 24 hours) at 05/10/12 1136 Last data filed at 05/10/12 0837  Gross per 24 hour  Intake    540 ml  Output    202 ml  Net    338 ml    CBG (last 3)   Recent Labs  05/09/12 2136 05/10/12 0743  GLUCAP 128* 107*   Physical Examination: General appearance - alert, in no acute distress, elderly gentleman sitting in chair Mental status - alert and oriented to person and place but not time.  Eyes - cloudy, whitish left eye without reactive pupil, right eye with poorly reactive pupil, crusty discharge from left more than right   Heart - normal rate, regular rhythm, normal S1, S2, no murmurs, rubs, clicks or gallops  Abdomen - soft, nontender, nondistended, no masses or organomegaly   Neurological - 4+/5 strength in lower extremities bilaterally Upon standing, patient needs full support.  Rectal: normal rectal tone Extremities -  trace pre-tibial edema bilaterally   Labs and Imaging  Recent Labs Lab 05/09/12 0801 05/10/12 0500  WBC 6.5 4.6  HGB 12.3* 12.3*  HCT 35.4* 35.5*  PLT 180 187     Recent Labs Lab 05/09/12 0801 05/10/12 0500  NA 136 138  K 4.1 3.8  CL 101 102  CO2 27 28  BUN 15 11  CREATININE 0.93 0.80  GLUCOSE 158* 107*  CALCIUM 9.2 9.0      Assessment and Plan 79 yo male with h/o DM2, HLD, CVA, neuropathy and blindness in left eye who presents with 1 week history of unsteady gate and falls.  # Unsteady gait and falls: upon review of his chart, patient does appear to have a gait  disorder secondary to previous cerebral infarct complicated by neuropathy likely related to diabetes, for which he was seen by physical therapy. Recently however, there appears to be a component of weakness which seems new. Likely multifactorial. - f/u B12, TSH, RPR  - consider lumbar MRI for evaluation of spinal stenosis  - follow up brain MRI  - PT recommending 24hr supervision/SNF - hold gabapentin in setting of weakness  - if has another low or elevated BP, will obtain blood culture  - CK was obtained in setting of long lie and risk for rhabdo and was normal   # DM: not on medication with A1C of 6.6 on 01/2012. Used to be on metformin which was stopped. Stable on BMP - f/u A1C  # h/o CVA: on ASA and losartan. Unclear as to why not on a statin  - continue ASA and losartan  # Blind left eye: referred to [facility] regarding surgery on left cornea.  - continue moxifloxacin ophthalmic solution.  Fen/Gi: diabetic diet  PPx: heparin  Dispo: PT recommending SNF/24hr supervision. Will consult social work for discussion of this with family. Follow up neuro workup.   Liam Graham [phone] 05/10/2012, 11:36 AM

## 2012-05-10 NOTE — Progress Notes (Signed)
Physical Therapy Evaluation Patient Details Name: Curtis Brock MRN: [id number] DOB: 1925/08/19 Today's Date: 05/10/2012 Time: [id number] PT Time Calculation (min): 26 min  PT Assessment / Plan / Recommendation Clinical Impression  Pt is 79 yo male with fall and weakness who presents with generalized weakness as well as apraxia and difficulty initiating mvmt and slow reaction times. Pt requiring +2 asisst and RW to safely ambulate and perform transfers. Recommend SNF at d/c for rehab. PT will follow acutely to work on strength and balance as well as motor planning.     PT Assessment  Patient needs continued PT services    Follow Up Recommendations  Supervision/Assistance - 24 hour;SNF    Does the patient have the potential to tolerate intense rehabilitation      Barriers to Discharge Decreased caregiver support not 24 hr supervision    Equipment Recommendations  Rolling walker with 5" wheels    Recommendations for Other Services OT consult   Frequency Min 2X/week    Precautions / Restrictions Precautions Precautions: Fall Restrictions Weight Bearing Restrictions: No Other Position/Activity Restrictions: blind left eye   Pertinent Vitals/Pain VSS      Mobility  Bed Mobility Bed Mobility: Supine to Sit;Sitting - Scoot to Edge of Bed Supine to Sit: 3: Mod assist Sitting - Scoot to Edge of Bed: 3: Mod assist Details for Bed Mobility Assistance: pt needing assist to initiate mvmt and min A to get legs off bed, mod A to push from flat bed to achieve upright, then with post LOB Transfers Transfers: Sit to Stand;Stand to Sit Sit to Stand: 1: +2 Total assist;From bed;With upper extremity assist Sit to Stand: Patient Percentage: 50% Stand to Sit: To chair/3-in-1;To bed;With upper extremity assist;4: Min assist Details for Transfer Assistance: pt's feet had to be blocked and assist given to wt-shift fwd. Pt with difficulty straightening knees to achieve upright posture and still  with posterior lean. Seems to be as much an apraxia issue as strength deficit. Ambulation/Gait Ambulation/Gait Assistance: 1: +2 Total assist Ambulation/Gait: Patient Percentage: 60% Ambulation Distance (Feet): 15 Feet Assistive device: Rolling walker Ambulation/Gait Assistance Details: assist needed to keep wt fwd, vc's given for advancing the RW as well as stepping feet. Heavy right lean with initial ambulation. Shuffling feet. When cued to take longer step, followed command but lost balance to right with mod A to correct  Gait Pattern: Trunk flexed;Narrow base of support;Shuffle Gait velocity: very slow Stairs: No Wheelchair Mobility Wheelchair Mobility: No    Exercises     PT Diagnosis: Difficulty walking;Abnormality of gait;Generalized weakness  PT Problem List: Decreased strength;Decreased activity tolerance;Decreased balance;Decreased mobility;Decreased coordination;Decreased cognition;Decreased knowledge of use of DME;Decreased safety awareness;Decreased knowledge of precautions;Decreased range of motion PT Treatment Interventions: DME instruction;Gait training;Functional mobility training;Therapeutic activities;Therapeutic exercise;Balance training;Neuromuscular re-education;Cognitive remediation;Patient/family education   PT Goals Acute Rehab PT Goals PT Goal Formulation: With patient Time For Goal Achievement: 05/24/12 Potential to Achieve Goals: Fair Pt will go Supine/Side to Sit: with supervision PT Goal: Supine/Side to Sit - Progress: Goal set today Pt will go Sit to Supine/Side: with supervision PT Goal: Sit to Supine/Side - Progress: Goal set today Pt will go Sit to Stand: with min assist PT Goal: Sit to Stand - Progress: Goal set today Pt will go Stand to Sit: with min assist PT Goal: Stand to Sit - Progress: Goal set today Pt will Ambulate: with min assist;51 - 150 feet;with rolling walker PT Goal: Ambulate - Progress: Goal set today  Visit Information  Last  PT  Received On: 05/10/12 Assistance Needed: +2    Subjective Data  Subjective: pt with minimal verbalization, lets son-in-law speak for him mostly, spoke more once he was up and walking Patient Stated Goal: return home, go back to [place] Lives With: Family Available Help at Discharge: Family;Available PRN/intermittently Type of Home: House Home Layout: Multi-level Alternate Level Stairs-Number of Steps: 8 Home Adaptive Equipment: None Additional Comments: pt lives with his son-in-law but he is at work late night through the morning. It has been working out [employer] while he was gone but recently, pt has been falling a lot and unable to get up Prior Function Level of Independence: Independent Able to Take Stairs?: Yes Driving: No Vocation: Retired Comments: pt recently received HHPT and did well and was walking well but when it stopped, he began to decline Communication Communication: No difficulties Dominant Hand: Right    Cognition  Cognition Arousal/Alertness: Awake/alert Behavior During Therapy: Flat affect Overall Cognitive Status: History of cognitive impairments - at baseline (mild)    Extremity/Trunk Assessment Right Upper Extremity Assessment RUE ROM/Strength/Tone: Deficits RUE ROM/Strength/Tone Deficits: pt right handed but chose to take pill cup from nurse with left hand. Grip strength stronger right vs left. Shoulder ROM limited bilaterally and shoulder flexion strength on the right grossly 3+/5. Weakness apparent with use of RW, pt having difficulty keeping triceps extended, unable to support wt through UE's RUE Sensation: History of peripheral neuropathy RUE Coordination: WFL - gross motor Left Upper Extremity Assessment LUE ROM/Strength/Tone: Deficits LUE ROM/Strength/Tone Deficits: shoulder flexion limited, strength grossly 3/5, see note for RUE LUE Sensation: History of peripheral neuropathy LUE Coordination: WFL - gross motor Right  Lower Extremity Assessment RLE ROM/Strength/Tone: Deficits RLE ROM/Strength/Tone Deficits: 3/5 at knee, 2+/5 hip RLE Sensation: History of peripheral neuropathy RLE Coordination: Deficits RLE Coordination Deficits: slow initiation of mvmt, difficulty with motor planning to perform sit to stand task Left Lower Extremity Assessment LLE ROM/Strength/Tone: Deficits LLE ROM/Strength/Tone Deficits: hip 2+/5, knee 3/5, see RLE note LLE Sensation: History of peripheral neuropathy LLE Coordination: Deficits LLE Coordination Deficits: same as RLE Trunk Assessment Trunk Assessment: Kyphotic   Balance Balance Balance Assessed: Yes Static Sitting Balance Static Sitting - Balance Support: Feet supported;Bilateral upper extremity supported Static Sitting - Level of Assistance: 4: Min assist;5: Stand by assistance Static Sitting - Comment/# of Minutes: min A at first to prevent post LOB, then stand by assist Static Standing Balance Static Standing - Balance Support: Bilateral upper extremity supported;During functional activity Static Standing - Level of Assistance: 3: Mod assist Static Standing - Comment/# of Minutes: post and right lean  End of Session PT - End of Session Equipment Utilized During Treatment: Gait belt Activity Tolerance: Patient limited by fatigue Patient left: in chair;with chair alarm set;with call bell/phone within reach;with family/visitor present Nurse Communication: Mobility status  GP    Leighton Roach, [place]  [phone]  Leighton Roach 05/10/2012, 11:36 AM

## 2012-05-11 DIAGNOSIS — R5381 Other malaise: Secondary | ICD-10-CM

## 2012-05-11 DIAGNOSIS — R531 Weakness: Secondary | ICD-10-CM | POA: Diagnosis present

## 2012-05-11 DIAGNOSIS — R269 Unspecified abnormalities of gait and mobility: Secondary | ICD-10-CM

## 2012-05-11 DIAGNOSIS — Z9181 History of falling: Secondary | ICD-10-CM

## 2012-05-11 DIAGNOSIS — I6350 Cerebral infarction due to unspecified occlusion or stenosis of unspecified cerebral artery: Secondary | ICD-10-CM

## 2012-05-11 LAB — GLUCOSE, CAPILLARY
Glucose-Capillary: 131 mg/dL — ABNORMAL HIGH (ref 70–99)
Glucose-Capillary: 105 mg/dL — ABNORMAL HIGH (ref 70–99)

## 2012-05-11 LAB — VITAMIN D 25 HYDROXY (VIT D DEFICIENCY, FRACTURES): Vit D, 25-Hydroxy: <10 ng/mL — ABNORMAL LOW (ref 30–89)

## 2012-05-11 MED ORDER — CYANOCOBALAMIN 1000 MCG/ML IJ SOLN
1000.0000 ug | INTRAMUSCULAR | Status: DC
  Administered 2012-05-11: 1000 ug via INTRAMUSCULAR
  Filled 2012-05-11: qty 1

## 2012-05-11 MED ORDER — SODIUM CHLORIDE (HYPERTONIC) 5 % OP SOLN: 1.0000 [drp] | Freq: Four times a day (QID) | OPHTHALMIC | Status: DC

## 2012-05-11 NOTE — Progress Notes (Signed)
Patient ID: Curtis Brock, male   DOB: 1925/12/04, 79 y.o.   MRN: [id number] FMTS Daily Intern Progress Note: pager: [phone]  Subjective:  Patient has no complaints this morning.  He is alert and eating his breakfast. He denies chest pain, shortness of breath or dizziness.  I have reviewed the patient's medications.  Objective Temp:  [98 F (36.7 C)-99.5 F (37.5 C)] 98 F (36.7 C) (04/28 0555) Pulse Rate:  [74-93] 74 (04/28 0555) Resp:  [18-20] 20 (04/28 0555) BP: (114-138)/(68-74) 114/70 mmHg (04/28 0555) SpO2:  [95 %-98 %] 95 % (04/28 0555)   Intake/Output Summary (Last 24 hours) at 05/11/12 0656 Last data filed at 05/11/12 0555  Gross per 24 hour  Intake    900 ml  Output    950 ml  Net    -50 ml    CBG (last 3)   Recent Labs  05/09/12 2136 05/10/12 0743  GLUCAP 128* 107*   Physical Examination:  General appearance - alert, in no acute distress, elderly gentleman sitting up in bed eating breakfast. Alert to person and place.  Eyes - cloudy, whitish left eye with out reactive pupil, right eye with poorly reactive pupil, crusty discharge from left more than right   Heart - normal rate, regular rhythm, normal S1, S2, no murmurs, rubs, clicks or gallops  Abdomen - soft, nontender, nondistended, no masses or organomegaly   Extremities -  +1/4 pre-tibial edema bilaterally. No erythema. Not ttp.  Neurological - 4+/5 strength in lower extremities bilaterally Upon standing, patient needs full support.   Labs and Imaging  Recent Labs Lab 05/09/12 0801 05/10/12 0500  WBC 6.5 4.6  HGB 12.3* 12.3*  HCT 35.4* 35.5*  PLT 180 187     Recent Labs Lab 05/09/12 0801 05/10/12 0500  NA 136 138  K 4.1 3.8  CL 101 102  CO2 27 28  BUN 15 11  CREATININE 0.93 0.80  GLUCOSE 158* 107*  CALCIUM 9.2 9.0   Assessment and Plan 79 yo male with h/o DM2, HLD, CVA, neuropathy and blindness in left eye who presents with 1 week history of unsteady gate and falls.   # Unsteady gait  and falls:  patient does appear to have a gait disorder secondary to previous cerebral infarct complicated by neuropathy likely related to diabetes, for which he was seen by physical therapy. Recently however, there appears to be a component of weakness which seems new. Likely multifactorial. - TSH, RPR are WNL; B12 slightly low 168 --> will replace today - follow up brain MRI  - PT recommending 24hr supervision/SNF - hold gabapentin in setting of weakness  - if has another low or elevated BP, will obtain blood culture  - CK was obtained in setting of long lie and risk for rhabdo and was normal   # DM: not on medication with A1C of 6.6 on 01/2012. Used to be on metformin which was stopped.  - A1C: 6.6   # h/o CVA: on ASA and losartan. Unclear as to why not on a statin  - continue ASA and losartan   # Blind left eye: referred to [facility] regarding surgery on left cornea.  - continue moxifloxacin ophthalmic solution.  Fen/Gi: diabetic diet  PPx: heparin  Dispo: PT recommending SNF/24hr supervision. Awaiting social work recommendations on placement.   Howard Pouch [phone] 05/11/2012, 6:56 AM

## 2012-05-11 NOTE — Discharge Summary (Signed)
Physician Discharge Summary  Patient ID: Curtis Brock MRN: [id number] DOB/AGE: 05-09-1925 79 y.o.  Admit date: 05/09/2012 Discharge date: 05/12/2012  Admission Diagnoses: Weakness and abnormal gait; fall  Discharge Diagnoses:  Principal Problem:   Gait disorder Active Problems:   Diabetes mellitus   Neuropathy   Weakness generalized   Discharged Condition: good  Hospital Course:  79 yo male with h/o DM2, HLD, CVA, neuropathy and blindness in left eye who presents with 1 week history of unsteady gate and falls.   # Unsteady gait and falls: patient does appear to have a gait disorder secondary to previous cerebral infarct complicated by neuropathy likely related to diabetes, for which he was seen by physical therapy. Recently however, there appears to be a component of weakness which seems new. Likely multifactorial. TSH, RPR are WNL; B12 slightly low 168 and was replaced. Brain MRI resulted atrophy and chronic ischemia, no acute processes. Held gabapentin in the setting of weakness.  CK was obtained in setting of long lie and risk for rhabdo and was normal. PT recommending 24hr supervision/SNF d/t unsteady gait and weakness, patient was placed at [facility].  # DM: not on medication with A1C of 6.6 on 01/2012. Used to be on metformin which was stopped. A1C: 6.6  # h/o CVA/HTN: on ASA and losartan. Unclear as to why not on a statin. Continued ASA and losartan.  # Blind left eye: referred to [facility] regarding surgery on left cornea. Continued moxifloxacin ophthalmic solution.    Consults: None  Significant Diagnostic Studies:  05/09/2012   CXR IMPRESSION:  1.  Subsegmental atelectasis at the right lung base.  Mildly low lung volumes. 2.  Degenerative glenohumeral arthropathy bilaterally. 3.  Thoracic spondylosis. 4.  Minimal tortuosity of the thoracic aorta.   Original Report Authenticated By: Van Clines, M.D.    4/26/2014CT Head IMPRESSION:  1.  No acute intracranial abnormalities.  2.  Small vessel ischemic change and brain atrophy.   Original Report Authenticated By: Kerby Moors, M.D.    05/10/2012  MR Brain IMPRESSION: Atrophy and chronic ischemia.  No acute abnormality.   Original Report Authenticated By: Carl Best, M.D.     Treatments: IV hydration, B12 injection  Discharge Exam: Blood pressure 132/84, pulse 78, temperature 98.1 F (36.7 C), temperature source Oral, resp. rate 18, SpO2 96.00%. General appearance - alert, in no acute distress, elderly gentleman sitting up in bed eating breakfast. Alert to person and place.  Eyes - cloudy, whitish left eye with out reactive pupil, right eye with poorly reactive pupil, crusty discharge from left more than right  Heart - normal rate, regular rhythm, normal S1, S2, no murmurs, rubs, clicks or gallops  Abdomen - soft, nontender, nondistended, no masses or organomegaly  Extremities - +1/4 pre-tibial edema bilaterally. No erythema. Not ttp.  Neurological - 4+/5 strength in lower extremities bilaterally  Upon standing, patient needs full support.    Disposition: 01-Home or Self Care      Discharge Orders   Future Appointments Provider Department Dept Phone   05/12/2012 3:45 PM Darlyne Russian, MD [place] [phone]   Future Orders Complete By Expires     Diet - low sodium heart healthy  As directed     Increase activity slowly  As directed         Medication List    TAKE these medications       aspirin EC 81 MG tablet  Take 81 mg by mouth daily.  cyanocobalamin 1000 MCG/ML injection  Commonly known as:  (VITAMIN B-12)  Inject 1 mL (1,000 mcg total) into the muscle every 30 (thirty) days.     gabapentin 100 MG capsule  Commonly known as:  NEURONTIN  Take 2 capsules (200 mg total) by mouth 2 (two) times daily.     losartan 50 MG tablet  Commonly known as:  COZAAR  Take 1 tablet (50 mg total) by mouth daily.     moxifloxacin 0.5 % ophthalmic solution  Commonly known as:   VIGAMOX  Place 1 drop into both eyes 4 (four) times daily.     sodium chloride 5 % ophthalmic solution  Commonly known as:  MURO 128  Place 1 drop into the left eye 4 (four) times daily.       Outpatient recommendations: - Consider starting a stain given patient history, if patient can tolerate it.  Signed: Kuneff, Renee 05/12/2012, 1:04 PM

## 2012-05-11 NOTE — Progress Notes (Signed)
FMTS Attending Note Patient seen and examined by me, discussed with Dr Raoul Pitch and I agree with her assessment and plan.  Patient's son-in-law present at the time of my visit.  Patient has had increasingly frequent falls despite having a walker in the home.  Family finds him on the floor and has difficulty getting him up from floor.   Patient is agreeable to SNF placement upon discharge.   Dalbert Mayotte, MD

## 2012-05-11 NOTE — Progress Notes (Signed)
UR Completed Add Dinapoli Graves-Bigelow, RN,BSN [phone]  

## 2012-05-11 NOTE — Progress Notes (Signed)
CSW received referral for SNF placement. CSW will meet with the pt and/or family to search for placement.  Danise Edge, [place] Social Worker [phone]

## 2012-05-11 NOTE — Evaluation (Signed)
Speech Language Pathology Evaluation Patient Details Name: Curtis Brock MRN: [id number] DOB: Mar 23, 1925 Today's Date: 05/11/2012 Time: [id number] SLP Time Calculation (min): 29 min  Problem List:  Patient Active Problem List   Diagnosis Date Noted  . Blind left eye 12/11/2011  . Neuropathy 12/11/2011  . Elevated LFTs 11/12/2011  . Diabetes mellitus 03/26/2011  . Hyperlipidemia 03/26/2011  . CVA (cerebral infarction) 03/26/2011  . Gait disorder 03/26/2011   Past Medical History:  Past Medical History  Diagnosis Date  . Diabetes mellitus without complication    Past Surgical History: History reviewed. No pertinent past surgical history. HPI:  79 yo male with h/o DM2, HLD, CVA, neuropathy and blindness in left eye who is brought by his son in law after he sustained a fall on the day of admission. MRI negative for acute infarct. Family states he has become more forgetful recently. They specify that he cant remember the daytes, but long term memory ahs been good. They state that prior to admit he was responsible for medication, finances.    Assessment / Plan / Recommendation Clinical Impression  Pt demonstrates moderate cognitive deficits in areas of memory, reasoning, problem solving and awareness that impact functional independence. Per pt and family, he was responsible for taking his own medications and managing finances prior to admit. Today he demonstrates good storage but poor retrieval of new information worsesning with increased time despite repetition. Combined with visual deficits and poor ability to for complex problem solving and reasoning, pt cannot even complete basic functional task without cueing/assist. Discussed finding with dtr, advising her to manage higher level tasks for pt, beyond physical needs. Pt will benefit from f/u SLP at next level of care - home health vs SNF.     SLP Assessment  Patient needs continued Speech Lanaguage Pathology Services    Follow Up  Recommendations  Home health SLP    Frequency and Duration min 2x/week  2 weeks   Pertinent Vitals/Pain NA   SLP Goals  SLP Goals SLP Goal #1: Pt will verbalize anticipatory awareness by verbalizing need for help with ADLs at d/c with min question cue.  SLP Goal #2: Pt will request help as needed during basic funcitonal/verbal task with min contextual cue in 80% of opportunities.   SLP Evaluation Prior Functioning  Cognitive/Linguistic Baseline: Baseline deficits Baseline deficit details: short term memory, vision Type of Home: House Lives With: Family Available Help at Discharge: Family;Available PRN/intermittently Vocation: Retired   Associate Professor  Overall Cognitive Status: History of cognitive impairments - at baseline Arousal/Alertness: Awake/alert Orientation Level: Oriented to person;Oriented to place;Oriented to time;Oriented to situation Attention: Sustained Sustained Attention: Appears intact Memory: Impaired Memory Impairment: Retrieval deficit;Decreased recall of new information;Decreased short term memory Decreased Short Term Memory: Functional basic;Verbal basic Awareness: Impaired Awareness Impairment: Intellectual impairment Problem Solving: Impaired Problem Solving Impairment: Verbal basic;Functional basic Executive Function: Reasoning Reasoning: Impaired Safety/Judgment: Impaired    Comprehension  Auditory Comprehension Overall Auditory Comprehension: Appears within functional limits for tasks assessed    Expression Verbal Expression Overall Verbal Expression: Appears within functional limits for tasks assessed   Oral / Motor Oral Motor/Sensory Function Overall Oral Motor/Sensory Function: Appears within functional limits for tasks assessed Motor Speech Overall Motor Speech: Impaired at baseline Respiration: Within functional limits Phonation: Normal Resonance: Within functional limits Articulation: Impaired Level of Impairment: Phrase  (disfluent) Intelligibility: Intelligible   GO    Curtis Baltimore, MA CCC-SLP [phone]  Lynann Beaver 05/11/2012, 12:15 PM

## 2012-05-11 NOTE — Evaluation (Signed)
Occupational Therapy Evaluation Patient Details Name: Curtis Brock MRN: [id number] DOB: [birth year] Today's Date: 05/11/2012 Time: [id number] OT Time Calculation (min): 25 min  OT Assessment / Plan / Recommendation Clinical Impression    Pt is 79 yo male with fall and weakness who presents with generalized weakness as well as apraxia and difficulty initiating mvmt and slow reaction times. Pt requiring +2 asisst and RW to safely ambulate and perform transfers. Pt at total A level for LB ADLs and requires Max A for UB ADLs.  Recommend SNF at d/c for rehab. OT will follow acutely to address below problem list.   OT Assessment  Patient needs continued OT Services    Follow Up Recommendations  SNF;Supervision/Assistance - 24 hour    Barriers to Discharge Decreased caregiver support    Equipment Recommendations  Other (comment) (defer to snf)    Recommendations for Other Services    Frequency  Min 2X/week    Precautions / Restrictions Precautions Precautions: Fall Restrictions Weight Bearing Restrictions: No Other Position/Activity Restrictions: blind left eye   Pertinent Vitals/Pain No pain reported.     ADL  Eating/Feeding: Independent Where Assessed - Eating/Feeding: Bed level Grooming: Wash/dry face Where Assessed - Grooming: Supine, head of bed up Upper Body Bathing: Maximal assistance Where Assessed - Upper Body Bathing: Supported sitting Lower Body Bathing: +1 Total assistance Where Assessed - Lower Body Bathing: Supine, head of bed flat;Supine, head of bed up Upper Body Dressing: Moderate assistance Where Assessed - Upper Body Dressing: Supported sitting Lower Body Dressing: +1 Total assistance Where Assessed - Lower Body Dressing: Supine, head of bed flat;Supine, head of bed up Toilet Transfer: +2 Total assistance Toilet Transfer: Patient Percentage: 20% Toilet Transfer Method: Sit to stand Toilet Transfer Equipment: Comfort height toilet;Grab bars Toileting - Clothing  Manipulation and Hygiene: +1 Total assistance Where Assessed - Best boy and Hygiene: Lean right and/or left Tub/Shower Transfer Method: Not assessed Equipment Used: Gait belt;Rolling walker ADL Comments: Pt able to perform supported sit to stand transfer but exhibited significant posterior lean. Pt at total A for LB ADLs. Gave pt a shirt to don and pt had difficulty problem solving to find correct hole to put arms. Assistance to pull overhead and to pull down over stomach- overall Max A level for task    OT Diagnosis: Generalized weakness;Cognitive deficits  OT Problem List: Decreased strength;Decreased activity tolerance;Decreased range of motion;Impaired balance (sitting and/or standing);Decreased cognition;Decreased safety awareness;Decreased knowledge of use of DME or AE;Decreased coordination;Decreased knowledge of precautions OT Treatment Interventions: Self-care/ADL training;DME and/or AE instruction;Therapeutic activities;Cognitive remediation/compensation;Patient/family education;Balance training   OT Goals Acute Rehab OT Goals OT Goal Formulation: With patient Time For Goal Achievement: 05/18/12 Potential to Achieve Goals: Good ADL Goals Pt Will Perform Upper Body Dressing: with min assist;Sitting, chair ADL Goal: Upper Body Dressing - Progress: Goal set today Pt Will Perform Lower Body Dressing: with mod assist;Sit to stand from chair;Sit to stand from bed ADL Goal: Lower Body Dressing - Progress: Goal set today Pt Will Transfer to Toilet: with min assist;Ambulation;with DME ADL Goal: Toilet Transfer - Progress: Goal set today Pt Will Perform Toileting - Clothing Manipulation: with min assist;Sitting on 3-in-1 or toilet ADL Goal: Toileting - Clothing Manipulation - Progress: Goal set today Pt Will Perform Toileting - Hygiene: with min assist;Sit to stand from 3-in-1/toilet;Leaning right and/or left on 3-in-1/toilet ADL Goal: Toileting - Hygiene - Progress:  Goal set today  Visit Information  Last OT Received On: 05/11/12 Assistance Needed: +2  Subjective Data      Prior Functioning     Home Living Lives With: Family Available Help at Discharge: Family;Available PRN/intermittently Type of Home: House Home Layout: Multi-level Alternate Level Stairs-Number of Steps: 8 Bathroom Shower/Tub: Tub/shower unit Home Adaptive Equipment: Shower chair with back Additional Comments: pt lives with his son-in-law but he is at work late night through the morning. It has been working out OK while he was gone but recently, pt has been falling a lot and unable to get up Prior Function Level of Independence: Needs assistance Needs Assistance: Bathing;Dressing Bath: Maximal Dressing: Moderate (able to don shirt, but Mod A for LB per pt report) Able to Take Stairs?: Yes Driving: No Vocation: Retired Corporate investment banker: No difficulties Dominant Hand: Right         Vision/Perception Vision - History Baseline Vision: Wears glasses all the time Vision - Assessment Vision Assessment: Vision tested Tracking/Visual Pursuits: Other (comment) (Rt eye had difficulty tracking in Left Lower quadrant ) Additional Comments: Pt does not report any visual changes   Cognition  Cognition Arousal/Alertness: Awake/alert Overall Cognitive Status: History of cognitive impairments - at baseline (Pt disoriented to time (stated it was 1913))    Extremity/Trunk Assessment Right Upper Extremity Assessment RUE ROM/Strength/Tone: Deficits RUE ROM/Strength/Tone Deficits: Pt with weakness in BUE grossly 3+/5 strength. Pt with limited ROM with shoulder flexion approx 100 degrees.  RUE Sensation: History of peripheral neuropathy Left Upper Extremity Assessment LUE ROM/Strength/Tone: Deficits LUE ROM/Strength/Tone Deficits: Pt with weakness in BUE grossly 3+/5 strength. Pt with limited ROM with shoulder flexion approx 100 degrees.  LUE Sensation: History of  peripheral neuropathy     Mobility Bed Mobility Bed Mobility: Supine to Sit;Sitting - Scoot to Edge of Bed Supine to Sit: 5: Supervision Sitting - Scoot to Edge of Bed: 3: Mod assist Details for Bed Mobility Assistance: Pt able to sit from supine with increased time at supervision level. Pt with diffulty intiating to scoot. Mod A for scooting EOB.  Transfers Transfers: Sit to Stand;Stand to Sit Sit to Stand: 1: +2 Total assist;From bed Sit to Stand: Patient Percentage: 20% Stand to Sit: 1: +2 Total assist;To chair/3-in-1 Stand to Sit: Patient Percentage: 10% Details for Transfer Assistance: Pt with significant posterior lean. Once chair was placed behind pt, he leaned back to sit without reaching for chair requiring signifcant assistance to control decent     Exercise     Balance     End of Session OT - End of Session Equipment Utilized During Treatment: Gait belt Activity Tolerance: Patient tolerated treatment well Patient left: in chair;with call bell/phone within reach;with chair alarm set Nurse Communication: Mobility status  GO     Benito Mccreedy OTR/L [id number] 05/11/2012, 2:36 PM

## 2012-05-12 ENCOUNTER — Ambulatory Visit: Payer: Medicare Other | Admitting: Emergency Medicine

## 2012-05-12 MED ORDER — CYANOCOBALAMIN 1000 MCG/ML IJ SOLN: 1000.0000 ug | INTRAMUSCULAR | Status: DC

## 2012-05-12 NOTE — Progress Notes (Signed)
DC orders received.  Patient stable with no S/S of distress.  Medication and discharge information reviewed with patient.  Attempted to call report to [facility] and left name/number for receiving nurse to call back for report.  Patient DC to SNF via ambulance. [place], Ardeth Sportsman

## 2012-05-12 NOTE — Progress Notes (Signed)
Clinical Social Work Department BRIEF PSYCHOSOCIAL ASSESSMENT 05/12/2012  Patient:  Curtis Brock, Curtis Brock     Account Number:  [id number]     Admit date:  05/09/2012  Clinical Social Worker:  Awilda Metro  Date/Time:  05/12/2012 01:19 PM  Referred by:  RN  Date Referred:  05/11/2012 Referred for  SNF Placement   Other Referral:   Interview type:   Other interview type:    PSYCHOSOCIAL DATA Living Status:  FAMILY Admitted from facility:   Level of care:   Primary support name:  [name] Primary support relationship to patient:  CHILD, ADULT Degree of support available:   Very good support from family. Pt lives with the dtr and son-in-law full time.    CURRENT CONCERNS Current Concerns  Post-Acute Placement   Other Concerns:    SOCIAL WORK ASSESSMENT / PLAN Met with pt to discuss SNF placement. Left a list of the facilities in the pt's room. CSW called dtr Nola and she sent her husband over to meet with me to discuss the SNF's.   Assessment/plan status:  No Further Intervention Required Other assessment/ plan:   Information/referral to community resources:   CSW gave son in law a list of the SNF's in the area.    PATIENT'S/FAMILY'S RESPONSE TO PLAN OF CARE: Pt and family are  in agreement for SNF placement. [facility] offered a bed and the family and pt accepted.   Danise Edge, [place] Social Worker [phone]

## 2012-05-12 NOTE — Progress Notes (Signed)
Patient ID: Curtis Brock, male   DOB: Jun 16, 1925, 79 y.o.   MRN: [id number] FMTS Daily Intern Progress Note: pager: [phone]  Subjective:  Patient feeding himself this morning and tolerating meal nicely. He denies any pain, shortness of breath or discomfort. He reports he feels like he is getting a little bit of strength back. He received a B12 shot yesterday for low B12.  Objective Temp:  [98 F (36.7 C)-98.1 F (36.7 C)] 98.1 F (36.7 C) (04/29 0503) Pulse Rate:  [74-81] 81 (04/29 0503) Resp:  [18-20] 18 (04/29 0503) BP: (118-169)/(49-85) 169/83 mmHg (04/29 0503) SpO2:  [96 %-100 %] 96 % (04/29 0503)   Intake/Output Summary (Last 24 hours) at 05/12/12 0720 Last data filed at 05/12/12 0500  Gross per 24 hour  Intake    300 ml  Output    900 ml  Net   -600 ml    CBG (last 3)   Recent Labs  05/11/12 1127 05/11/12 1619 05/11/12 2031  GLUCAP 131* 128* 105*   Physical Examination:  General appearance - alert, in no acute distress, elderly gentleman sitting up in bed eating breakfast. Alert to person and place.  Eyes - cloudy, whitish left eye with out reactive pupil, right eye with poorly reactive pupil, crusty discharge from left more than right   Heart - normal rate, regular rhythm, normal S1, S2, no murmurs, rubs, clicks or gallops  Abdomen - soft, nontender, nondistended, no masses or organomegaly   Extremities -  trace pre-tibial edema bilaterally. No erythema. Not ttp.  Neurological - 4+/5 strength in lower extremities bilaterally Upon standing, patient needs full support.   Labs and Imaging  Recent Labs Lab 05/09/12 0801 05/10/12 0500  WBC 6.5 4.6  HGB 12.3* 12.3*  HCT 35.4* 35.5*  PLT 180 187     Recent Labs Lab 05/09/12 0801 05/10/12 0500  NA 136 138  K 4.1 3.8  CL 101 102  CO2 27 28  BUN 15 11  CREATININE 0.93 0.80  GLUCOSE 158* 107*  CALCIUM 9.2 9.0   Dg CXR.  IMPRESSION:  1.  Subsegmental atelectasis at the right lung base.  Mildly low lung  volumes. 2.  Degenerative glenohumeral arthropathy bilaterally. 3.  Thoracic spondylosis. 4.  Minimal tortuosity of the thoracic aorta.   Original Report Authenticated By: Van Clines, M.D.    Ct Head Wo Contrast 05/09/2012  IMPRESSION:  1.  No acute intracranial abnormalities. 2.  Small vessel ischemic change and brain atrophy.   Original Report Authenticated By: Kerby Moors, M.D.    Mr Brain Wo Contrast   IMPRESSION: Atrophy and chronic ischemia.  No acute abnormality.   Original Report Authenticated By: Carl Best, M.D.    Assessment and Plan 79 yo male with h/o DM2, HLD, CVA, neuropathy and blindness in left eye who presents with 1 week history of unsteady gate and falls.   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 Recently however, there appears to be a component of weakness which seems new. Likely multifactorial. - TSH, RPR are WNL; B12 slightly low 168 - Replaced yesterday  - MRI: No acute process - PT recommending 24hr supervision/SNF - hold gabapentin in setting of weakness   - CK was obtained in setting of long lie and risk for rhabdo and was normal   # DM: not on  medication with A1C of 6.6 on 01/2012. Used to be on metformin which was stopped.  - A1C: 6.6   # h/o CVA: on ASA and losartan. Unclear as to why not on a statin  - continue ASA and losartan   # Blind left eye: referred to [facility] regarding surgery on left cornea.  - continue moxifloxacin ophthalmic solution.  Fen/Gi: diabetic diet  PPx: heparin  Dispo: PT recommending SNF/24hr supervision. Medically clear for discharge today. Awaiting social work recommendations on placement.   Howard Pouch [phone] 05/12/2012, 7:20 AM

## 2012-05-12 NOTE — Progress Notes (Signed)
OT EVALUATION addendum - late entry   05/11/12 1344  OT Visit Information  Last OT Received On 05/11/12  Assistance Needed +2  OT Time Calculation  OT Start Time 1314  OT Stop Time 1339  OT Time Calculation (min) 25 min  Precautions  Precautions Fall  Restrictions  Weight Bearing Restrictions No  Other Position/Activity Restrictions blind left eye  Home Living  Lives With Family  Available Help at Discharge Family;Available PRN/intermittently  Type of Home House  Home Layout Multi-level  Alternate Level Stairs-Number of Steps 8  Bathroom Shower/Tub Tub/shower unit  Home Administrator, Civil Service chair with back  Additional Comments pt lives with his son-in-law but he is at work late night through the morning. It has been working out OK while he was gone but recently, pt has been falling a lot and unable to get up  Prior Function  Level of Independence Needs assistance  Needs Assistance Bathing;Dressing  Bath Maximal  Dressing Moderate (able to don shirt, but Mod A for LB per pt report)  Able to Take Stairs? Yes  Driving No  Vocation Retired  Engineer, petroleum No difficulties  ADL  Eating/Feeding Independent  Where Assessed - Eating/Feeding Bed level  Grooming Wash/dry face  Where Assessed - Grooming Supine, head of bed up  Upper Body Bathing Maximal assistance  Where Assessed - Upper Body Bathing Supported sitting  Lower Body Bathing +1 Total assistance  Where Assessed - Lower Body Bathing Supine, head of bed flat;Supine, head of bed up  Upper Body Dressing Maximal assistance  Where Assessed - Upper Body Dressing Supported sitting  Lower Body Dressing +1 Total assistance  Where Assessed - Lower Body Dressing Supine, head of bed flat;Supine, head of bed up  Toilet Transfer +2 Total assistance  Toilet Transfer: Patient Percentage 20%  Toilet Transfer Method Sit to Surveyor, quantity Comfort height toilet;Grab bars  Toileting - Clothing  Manipulation and Hygiene +1 Total assistance  Where Assessed - [place] right and/or left  Tub/Shower Transfer Method Not assessed  Equipment Used Gait belt;Rolling walker  ADL Comments Pt able to perform supported sit to stand transfer but exhibited significant posterior lean. Pt at total A for LB ADLs. Gave pt a shirt to don and pt had difficulty problem solving to find correct hole to put arms. Assistance to pull overhead- overall Max A level for task  Vision - History  Baseline Vision Wears glasses all the time  Vision - Assessment  Vision Assessment Vision tested  Tracking/Visual Pursuits Other (comment) (Rt eye had difficulty tracking in Left Lower quadrant )  Additional Comments Pt does not report any visual changes  Cognition  Arousal/Alertness Awake/alert  Overall Cognitive Status History of cognitive impairments - at baseline (Pt disoriented to time (stated it was 1913))  Right Upper Extremity Assessment  RUE ROM/Strength/Tone Deficits  RUE ROM/Strength/Tone Deficits Pt with weakness in BUE grossly 3+/5 strength. Pt with limited ROM with shoulder flexion approx 100 degrees.   RUE Sensation History of peripheral neuropathy  Left Upper Extremity Assessment  LUE ROM/Strength/Tone Deficits  LUE ROM/Strength/Tone Deficits Pt with weakness in BUE grossly 3+/5 strength. Pt with limited ROM with shoulder flexion approx 100 degrees.   LUE Sensation History of peripheral neuropathy  Bed Mobility  Bed Mobility Supine to Sit;Sitting - Scoot to Edge of Bed  Supine to Sit 5: Supervision  Sitting - Scoot to Edge of Bed 3: Mod assist  Details for Bed Mobility  Assistance Pt able to sit from supine with increased time at supervision level. Pt with diffulty intiating to scoot. Mod A for scooting EOB.   Transfers  Transfers Sit to Stand;Stand to Sit  Sit to Stand 1: +2 Total assist;From bed  Sit to Stand: Patient Percentage 20%  Stand to Sit 1: +2 Total  assist;To chair/3-in-1  Stand to Sit: Patient Percentage 10%  Details for Transfer Assistance Pt with significant posterior lean. Once chair was placed behind pt, he leaned back to sit without reaching for chair requiring signifcant assistance to control decent  OT - End of Session  Equipment Utilized During Treatment Gait belt  Activity Tolerance Patient tolerated treatment well  Patient left in chair;with call bell/phone within reach;with chair alarm set  Nurse Communication Mobility status  OT Assessment  OT Recommendation/Assessment Patient needs continued OT Services  OT Problem List Decreased strength;Decreased activity tolerance;Decreased range of motion;Impaired balance (sitting and/or standing);Decreased cognition;Decreased safety awareness;Decreased knowledge of use of DME or AE;Decreased coordination;Decreased knowledge of precautions  Barriers to Discharge Decreased caregiver support  OT Therapy Diagnosis  Generalized weakness;Cognitive deficits  OT Plan  OT Frequency Min 2X/week  OT Treatment/Interventions Self-care/ADL training;DME and/or AE instruction;Therapeutic activities;Cognitive remediation/compensation;Patient/family education;Balance training  OT Recommendation  Follow Up Recommendations SNF;Supervision/Assistance - 24 hour  OT Equipment Other (comment) (defer to snf)  Individuals Consulted  Consulted and Agree with Results and Recommendations Patient  Acute Rehab OT Goals  OT Goal Formulation With patient  Time For Goal Achievement 05/18/12  Potential to Achieve Goals Good  ADL Goals  Pt Will Perform Lower Body Dressing with mod assist;Sit to stand from chair;Sit to stand from bed  Pt Will Perform Upper Body Dressing with min assist;Sitting, chair  Pt Will Transfer to Toilet with min assist;Ambulation;with DME  Pt Will Perform Toileting - Clothing Manipulation with min assist;Sitting on 3-in-1 or toilet  Pt Will Perform Toileting - Hygiene with min assist;Sit to  stand from 3-in-1/toilet;Leaning right and/or left on 3-in-1/toilet  ADL Goal: Upper Body Dressing - Progress Goal set today  ADL Goal: Lower Body Dressing - Progress Goal set today  ADL Goal: Toilet Transfer - Progress Goal set today  ADL Goal: Toileting - Clothing Manipulation - Progress Goal set today  ADL Goal: Toileting - Hygiene - Progress Goal set today  OT G-codes **NOT FOR INPATIENT CLASS**  Functional Assessment Tool Used clinical judgement  Functional Limitation Self care  Self Care Current Status [id number]) CM  Self Care Goal Status [id number]) CK  OT General Charges  $OT Visit 1 Procedure  OT Evaluation  $Initial OT Evaluation Tier I 1 Procedure  OT Treatments  $Therapeutic Activity 8-22 mins  Written Expression  Dominant Hand Right  Maurie Boettcher, OTR/L  [phone] 05/12/2012

## 2012-05-12 NOTE — Care Management Note (Signed)
    Page 1 of 1   05/12/2012     2:44:20 PM   CARE MANAGEMENT NOTE 05/12/2012  Patient:  Curtis Brock, Curtis Brock   Account Number:  [id number]  Date Initiated:  05/12/2012  Documentation initiated by:  GRAVES-BIGELOW,Toria Monte  Subjective/Objective Assessment:   Pt admitted with weakness. Plan for d/c to [facility] today.     Action/Plan:   CSW assisting with disposition needs. NO needs from CM at this time.   Anticipated DC Date:  05/12/2012   Anticipated DC Plan:  [place]  CM consult      Choice offered to / List presented to:             Status of service:  Completed, signed off Medicare Important Message given?   (If response is "NO", the following Medicare IM given date fields will be blank) Date Medicare IM given:   Date Additional Medicare IM given:    Discharge Disposition:  [place]  Per UR Regulation:  Reviewed for med. necessity/level of care/duration of stay  If discussed at [place] of Stay Meetings, dates discussed:    Comments:

## 2012-05-12 NOTE — Progress Notes (Signed)
Physical Therapy Treatment Patient Details Name: Curtis Brock MRN: [id number] DOB: November 07, 1925 Today's Date: 05/12/2012 Time: [id number] PT Time Calculation (min): 16 min  PT Assessment / Plan / Recommendation Comments on Treatment Session  Pt progressing slowly with mobility.  Very unsteady with ambulation & requires +2 (A) for OOB mobility still at this time  Requires increased time for all mobility, initiation of movement, following cues.  Cont to strongly recommend SNF at d/c to maximize functional recovery.      Follow Up Recommendations  Supervision/Assistance - 24 hour;SNF     Does the patient have the potential to tolerate intense rehabilitation     Barriers to Discharge        Equipment Recommendations  Rolling walker with 5" wheels    Recommendations for Other Services    Frequency Min 2X/week   Plan Discharge plan remains appropriate    Precautions / Restrictions Precautions Precautions: Fall Restrictions Weight Bearing Restrictions: No Other Position/Activity Restrictions: blind left eye       Mobility  Bed Mobility Bed Mobility: Supine to Sit;Sitting - Scoot to Edge of Bed Supine to Sit: 3: Mod assist;HOB flat;With rails Sitting - Scoot to Edge of Bed: 3: Mod assist Details for Bed Mobility Assistance: Pt very slow to respond to cues & to intiate movement.  (A) to initiate LE movement, lift shoulders/trunk to sitting upright, & use of draw pad to scoot closer to EOB.   Transfers Transfers: Sit to Stand;Stand to Sit Sit to Stand: 1: +2 Total assist;With upper extremity assist;From bed Sit to Stand: Patient Percentage: 40% Stand to Sit: 1: +2 Total assist;With upper extremity assist;With armrests;To chair/3-in-1 Stand to Sit: Patient Percentage: 30% Details for Transfer Assistance: Cues for hand placement & technique.  Conts with significant posterior lean & resists attempt to manually shift weight forwards Ambulation/Gait Ambulation/Gait Assistance: 1: +2 Total  assist Ambulation/Gait: Patient Percentage: 60% Ambulation Distance (Feet): 20 Feet Assistive device: Rolling walker Ambulation/Gait Assistance Details: Pt leans heavily to Rt & posteriorly.   Able to correct balance & BOS with static standing + mod (A) but unable to maintain Gait Pattern: Narrow base of support;Lateral trunk lean to right;Shuffle;Decreased stride length (posterior lean) Gait velocity: very slow Stairs: No Wheelchair Mobility Wheelchair Mobility: No       PT Goals Acute Rehab PT Goals Time For Goal Achievement: 05/24/12 Potential to Achieve Goals: Fair Pt will go Supine/Side to Sit: with supervision PT Goal: Supine/Side to Sit - Progress: Progressing toward goal Pt will go Sit to Supine/Side: with supervision Pt will go Sit to Stand: with min assist PT Goal: Sit to Stand - Progress: Progressing toward goal Pt will go Stand to Sit: with min assist PT Goal: Stand to Sit - Progress: Progressing toward goal Pt will Ambulate: with min assist;51 - 150 feet;with rolling walker PT Goal: Ambulate - Progress: Progressing toward goal  Visit Information  Last PT Received On: 05/12/12 Assistance Needed: +2    Subjective Data      Cognition  Cognition Arousal/Alertness: Awake/alert Behavior During Therapy: Flat affect Overall Cognitive Status: History of cognitive impairments - at baseline    Balance  Static Standing Balance Static Standing - Balance Support: Bilateral upper extremity supported Static Standing - Level of Assistance: 3: Mod assist Static Standing - Comment/# of Minutes: heavy posterior & Rt lean.    End of Session PT - End of Session Equipment Utilized During Treatment: Gait belt Activity Tolerance: Patient limited by fatigue Patient left: in chair;with call  bell/phone within reach;with chair alarm set Nurse Communication: Mobility status     Sarajane Marek, [place] 05/12/2012

## 2012-05-12 NOTE — Progress Notes (Signed)
Clinical Social Work Department CLINICAL SOCIAL WORK PLACEMENT NOTE 05/12/2012  Patient:  Curtis Brock, Curtis Brock  Account Number:  [id number] Admit date:  05/09/2012  Clinical Social Worker:  Danise Edge, Latanya Presser  Date/time:  05/12/2012 01:27 PM  Clinical Social Work is seeking post-discharge placement for this patient at the following level of care:   SKILLED NURSING   (*CSW will update this form in Epic as items are completed)   05/12/2012  Patient/family provided with [facility] Department of Clinical Social Work's list of facilities offering this level of care within the geographic area requested by the patient (or if unable, by the patient's family).  05/12/2012  Patient/family informed of their freedom to choose among providers that offer the needed level of care, that participate in Medicare, Medicaid or managed care program needed by the patient, have an available bed and are willing to accept the patient.  05/12/2012  Patient/family informed of [facility]' ownership interest in [facility], as well as of the fact that they are under no obligation to receive care at this facility.  PASARR submitted to EDS on 05/12/2012 PASARR number received from EDS on 05/12/2012  FL2 transmitted to all facilities in geographic area requested by pt/family on  05/11/2012 FL2 transmitted to all facilities within larger geographic area on 05/11/2012  Patient informed that his/her managed care company has contracts with or will negotiate with  certain facilities, including the following:     Patient/family informed of bed offers received:  05/12/2012 Patient chooses bed at [facility] Physician recommends and patient chooses bed at    Patient to be transferred to [facility] on  05/12/2012 Patient to be transferred to facility by [facility] ambulance service  The following physician request were entered in Epic:   Additional Comments: Danise Edge,  [place] Worker [phone]

## 2012-05-13 NOTE — Progress Notes (Signed)
05/11/12 1100  SLP G-Codes **NOT FOR INPATIENT CLASS**  Functional Assessment Tool Used (clinical judgement)  Functional Limitations Memory  Memory Current Status [id number]) CK  Memory Goal Status [id number]) CK  Memory Discharge Status (G9170) CK  SLP Evaluations  $ SLP Speech Visit 1 Procedure  SLP Evaluations  $Cognitive Skills Development 8-22  $ SLP EVAL LANGUAGE/SOUND PRODUCTION 1 Procedure

## 2012-05-15 ENCOUNTER — Non-Acute Institutional Stay (SKILLED_NURSING_FACILITY): Payer: Medicare Other | Admitting: Internal Medicine

## 2012-05-15 DIAGNOSIS — I10 Essential (primary) hypertension: Secondary | ICD-10-CM

## 2012-05-15 DIAGNOSIS — G589 Mononeuropathy, unspecified: Secondary | ICD-10-CM

## 2012-05-15 DIAGNOSIS — I6350 Cerebral infarction due to unspecified occlusion or stenosis of unspecified cerebral artery: Secondary | ICD-10-CM

## 2012-05-15 DIAGNOSIS — I639 Cerebral infarction, unspecified: Secondary | ICD-10-CM

## 2012-05-15 DIAGNOSIS — E1149 Type 2 diabetes mellitus with other diabetic neurological complication: Secondary | ICD-10-CM

## 2012-05-15 DIAGNOSIS — G629 Polyneuropathy, unspecified: Secondary | ICD-10-CM

## 2012-05-16 ENCOUNTER — Non-Acute Institutional Stay (SKILLED_NURSING_FACILITY): Payer: Medicare Other | Admitting: Internal Medicine

## 2012-05-16 DIAGNOSIS — R269 Unspecified abnormalities of gait and mobility: Secondary | ICD-10-CM

## 2012-05-19 ENCOUNTER — Non-Acute Institutional Stay (SKILLED_NURSING_FACILITY): Payer: Medicare Other | Admitting: Internal Medicine

## 2012-05-19 DIAGNOSIS — R404 Transient alteration of awareness: Secondary | ICD-10-CM

## 2012-05-19 DIAGNOSIS — E538 Deficiency of other specified B group vitamins: Secondary | ICD-10-CM

## 2012-05-19 NOTE — Progress Notes (Signed)
Patient ID: Curtis Brock, male   DOB: 25-Jul-1925, 79 y.o.   MRN: [id number]          PROGRESS NOTE  DATE:  05/16/2012   [facility]   LEVEL OF CARE:   SNF   Acute Visit   CHIEF COMPLAINT:  Gait review.    HISTORY OF PRESENT ILLNESS:  This is an 79 year-old man with diabetes and a past history of a CVA and blindness in the left eye.  He presented with a one-week history of unsteady gait and falls.  In the hospital, he was found to have a slightly low B12 level at 168 which was replaced, although I do not see the exact mechanism here.  His brain MRI showed atrophy and chronic ischemia without acute process.  CK was normal.    PHYSICAL EXAMINATION:   NEUROLOGICAL:   SENSATION/STRENGTH:  His strength is 4+/5 in the lower extremities.   DEEP TENDON REFLEXES:  He is diffusely hyporeflexic.  No ankle or knee jerks are obtained.  His plantar response is equivocal.   BALANCE/GAIT:  He can bring himself to a standing position from the wheelchair with minimal standby assistance.   He walks with a shuffling gait reminiscent of a Parkinson's like gait, although I see no other features of an extrapyramidal disorder.    ASSESSMENT/PLAN:  Gait ataxia.  Likely secondary to diabetic neuropathy and multifactorial.  This is not reminiscent of subacute combined degeneration of the cord (B12 deficiency).  However, if B12 had something to do with this, it may take several weeks after replenishing the B12 to see if this has any effect.     His gait currently looks functional to me.  Depending on the home situation here, discharging is quite possible if the social support is there.     CPT CODE: 91478

## 2012-05-20 ENCOUNTER — Non-Acute Institutional Stay (SKILLED_NURSING_FACILITY): Payer: Medicare Other | Admitting: Internal Medicine

## 2012-05-20 DIAGNOSIS — D638 Anemia in other chronic diseases classified elsewhere: Secondary | ICD-10-CM

## 2012-05-22 NOTE — Progress Notes (Signed)
Patient ID: Curtis Brock, male   DOB: [birth year], 79 y.o.   MRN: [id number]          PROGRESS NOTE  DATE:  05/19/2012   [facility]   LEVEL OF CARE:   SNF   Acute Visit   CHIEF COMPLAINT:  Lethargy.    HISTORY OF PRESENT ILLNESS:  This is an 79 year-old man who came to [place] after a hospitalization from 05/09/2012 through 05/12/2012.  This seemed to be because of unsteady gait and falls.  He was found to have a slightly low B12 level which was replaced.  A brain MRI showed atrophy and chronic ischemia without acute process.  His gabapentin was held but then resumed.  His total CK was normal.     Staff and family have noted increasing lethargy, falling asleep during conversations, etc.  He has stable vital signs.  He is not febrile.     REVIEW OF SYSTEMS:   CHEST/RESPIRATORY:  He is not complaining of shortness of breath.   CARDIAC:   No chest pain.  GI:  No nausea, vomiting or diarrhea. GU:  No dysuria.    PHYSICAL EXAMINATION:   GENERAL APPEARANCE:  The patient is awake and responds to questions.  However, he does appear to be listless, not overtly delirious.   CHEST/RESPIRATORY:  Clear air entry bilaterally.  CARDIOVASCULAR:  CARDIAC:   He appears to be euvolemic.  Heart sounds are normal.   GASTROINTESTINAL:  ABDOMEN:   Soft.  No masses.   LIVER/SPLEEN/KIDNEYS:  No liver, no spleen.   GENITOURINARY:  BLADDER:   No suprapubic or costovertebral angle tenderness.  NEUROLOGICAL:   Nothing is lateralizing.  He has no EPS type findings.   PSYCHIATRIC:   MENTAL STATUS:  Again, he is awake, responds, yet he seems a little lethargic, not as bright and quick as people are used to seeing him.    ASSESSMENT/PLAN:  Question early delirium.  This seems very similar to what was described in the hospital.  His Neurontin was put on hold at that point and I am going to put it on hold again.  Lab work was done this morning, including a CBC and basic metabolic panel.  I see no other  obvious findings.  His vital signs are stable.  He is not febrile.     Vitamin B12 deficiency: With thee assumption that this has something to do with the neurologic presentation of altered mental status and falls. This may take months to reverse if it does at all.     CPT CODE: 21308

## 2012-05-27 ENCOUNTER — Non-Acute Institutional Stay (SKILLED_NURSING_FACILITY): Payer: Medicare Other | Admitting: Internal Medicine

## 2012-05-27 DIAGNOSIS — E1059 Type 1 diabetes mellitus with other circulatory complications: Secondary | ICD-10-CM

## 2012-05-27 DIAGNOSIS — R269 Unspecified abnormalities of gait and mobility: Secondary | ICD-10-CM

## 2012-05-27 DIAGNOSIS — G609 Hereditary and idiopathic neuropathy, unspecified: Secondary | ICD-10-CM

## 2012-05-27 DIAGNOSIS — I6990 Unspecified sequelae of unspecified cerebrovascular disease: Secondary | ICD-10-CM

## 2012-05-28 DIAGNOSIS — R269 Unspecified abnormalities of gait and mobility: Secondary | ICD-10-CM | POA: Insufficient documentation

## 2012-05-28 DIAGNOSIS — I6990 Unspecified sequelae of unspecified cerebrovascular disease: Secondary | ICD-10-CM | POA: Insufficient documentation

## 2012-05-28 DIAGNOSIS — G609 Hereditary and idiopathic neuropathy, unspecified: Secondary | ICD-10-CM | POA: Insufficient documentation

## 2012-05-28 DIAGNOSIS — E1051 Type 1 diabetes mellitus with diabetic peripheral angiopathy without gangrene: Secondary | ICD-10-CM | POA: Insufficient documentation

## 2012-05-28 DIAGNOSIS — E1059 Type 1 diabetes mellitus with other circulatory complications: Secondary | ICD-10-CM | POA: Insufficient documentation

## 2012-05-28 NOTE — Progress Notes (Signed)
PROGRESS NOTE  DATE: 05/27/2012  FACILITY: [facility]  LEVEL OF CARE: SNF (31)  Discharge Visit  CHIEF COMPLAINT:  Manage gait disorder and diabetes mellitus  HISTORY OF PRESENT ILLNESS: I was requested by the social worker to perform face-to-face evaluation for discharge:  Patient was admitted to this facility for short-term rehabilitation after the patient's recent hospitalization.  Patient has completed SNF rehabilitation and therapy has cleared the patient for discharge on 06-01-12..  Reassessment of ongoing problem(s):  1. gait disorder-patient was hospitalized for unsteady gait and recurrent falls. He continues to have gait problems.  2. DM:pt's DM remains stable.  Pt denies polyuria, polydipsia, polyphagia, changes in vision or hypoglycemic episodes.  No complications noted from the medication presently being used.  Last hemoglobin A1c is: 6.6 in 5/14.  PAST MEDICAL HISTORY : Reviewed.  No changes.  CURRENT MEDICATIONS: Reviewed per [facility]  REVIEW OF SYSTEMS:  GENERAL: no change in appetite, no fatigue, no weight changes, no fever, chills or weakness RESPIRATORY: no cough, SOB, DOE, wheezing, hemoptysis CARDIAC: no chest pain, edema or palpitations GI: no abdominal pain, diarrhea, constipation, heart burn, nausea or vomiting  PHYSICAL EXAMINATION  VS:  T 97       P 82       RR 18      BP 136/84      POX %       WT (Lb) 205  GENERAL: no acute distress, obese body habitus EYES: conjunctivae normal, sclerae normal, normal eye lids NECK: supple, trachea midline, no neck masses, no thyroid tenderness, no thyromegaly LYMPHATICS: no LAN in the neck, no supraclavicular LAN RESPIRATORY: breathing is even & unlabored, BS CTAB CARDIAC: RRR, no murmur,no extra heart sounds, +2 bilateral lower extremity edema GI: abdomen soft, normal BS, no masses, no tenderness, no hepatomegaly, no splenomegaly PSYCHIATRIC: the patient is alert & oriented to person, affect &  behavior appropriate  LABS/RADIOLOGY:  5/14 hemoglobin 11 MCV 81 otherwise CBC normal, BMP normal  ASSESSMENT/PLAN:  1. gait disorder-continue therapy at home. 2. diabetes mellitus with vascular complications-well controlled. 3. peripheral neuropathy-Neurontin was discontinued due to [place]. Patient denies any symptoms. 4. CVA-no acute issues. 5. vitamin B12 deficiency-continue supplementation. 6. hypertension-well controlled.  I have filled out patient's discharge paperwork and written prescriptions.  Patient will receive home health PT, OT, ST and CNA. DME provided: rolling walker (781.2, 438.9)  Total discharge time: Greater than 30 minutes Discharge time involved coordination of the discharge process with social worker, nursing staff and therapy department. Medical justification for home health services/DME verified.  CPT CODE: 16109

## 2012-06-02 DIAGNOSIS — M6281 Muscle weakness (generalized): Secondary | ICD-10-CM

## 2012-06-02 DIAGNOSIS — R269 Unspecified abnormalities of gait and mobility: Secondary | ICD-10-CM

## 2012-06-02 DIAGNOSIS — I10 Essential (primary) hypertension: Secondary | ICD-10-CM

## 2012-06-02 DIAGNOSIS — E119 Type 2 diabetes mellitus without complications: Secondary | ICD-10-CM

## 2012-06-03 DIAGNOSIS — E1149 Type 2 diabetes mellitus with other diabetic neurological complication: Secondary | ICD-10-CM | POA: Insufficient documentation

## 2012-06-03 DIAGNOSIS — I10 Essential (primary) hypertension: Secondary | ICD-10-CM | POA: Insufficient documentation

## 2012-06-03 NOTE — Progress Notes (Signed)
Patient ID: Curtis Brock, male   DOB: 10/21/1925, 79 y.o.   MRN: [id number]        HISTORY & PHYSICAL  DATE:  05/15/2012   FACILITY: [facility]  LEVEL OF CARE:  SNF  ALLERGIES:  No Known Allergies  CHIEF COMPLAINT:  Manage diabetes mellitus, peripheral neuropathy, and hypertension.   HISTORY OF PRESENT ILLNESS:  79 year-old, African-American male was hospitalized secondary to unsteady gait and falls.  After hospitalization, he is admitted to this facility for 24-hour supervision and rehabilitation.  He has the following problems:   DM:pt's DM remains stable.  Pt denies polyuria, polydipsia, polyphagia, changes in vision or hypoglycemic episodes.  He is off medications.  Last hemoglobin A1c is: 6.6.  PERIPHERAL NEUROPATHY: The peripheral neuropathy is stable. The patient denies pain in the feet, tingling, and numbness. No complications noted from the medication presently being used.   HTN: Pt 's HTN remains stable.  Denies CP, sob, DOE, pedal edema, headaches, dizziness or visual disturbances.  No complications from the medications currently being used.  Last BP : 136/84.  PAST MEDICAL HISTORY :  Past Medical History  Diagnosis Date  . Diabetes mellitus without complication    PAST SURGICAL HISTORY: None (per record)  SOCIAL HISTORY: (per record)  reports that he has never smoked. He does not have any smokeless tobacco history on file. He reports that he does not drink alcohol or use illicit drugs.  FAMILY HISTORY: None (per record)  CURRENT MEDICATIONS: Reviewed per [facility]  REVIEW OF SYSTEMS:  Difficult to obtain.  Patient is a poor historian.   PHYSICAL EXAMINATION  VS:  T 97       P 82      RR 18      BP 136/84      POX%        WT (Lb) 205  GENERAL: no acute distress, moderately obese body habitus SKIN: warm & dry, no suspicious lesions or rashes, no excessive dryness EYES: conjunctivae normal, sclerae normal, normal eye lids MOUTH/THROAT: lips without  lesions,no lesions in the mouth,tongue is without lesions,uvula elevates in midline NECK: supple, trachea midline, no neck masses, no thyroid tenderness, no thyromegaly LYMPHATICS: no LAN in the neck, no supraclavicular LAN RESPIRATORY: breathing is even & unlabored, BS CTAB CARDIAC: RRR, no murmur,no extra heart sounds EDEMA/VARICOSITIES:  +2 bilateral lower extremity edema  ARTERIAL:  pedal pulses nonpalpable  GI:  ABDOMEN: abdomen soft, normal BS, no masses, no tenderness  LIVER/SPLEEN: no hepatomegaly, no splenomegaly MUSCULOSKELETAL: HEAD: normal to inspection & palpation BACK: no kyphosis, scoliosis or spinal processes tenderness EXTREMITIES: LEFT UPPER EXTREMITY: strength decreased, range of motion normal RIGHT UPPER EXTREMITY: strength decreased, range of motion normal LEFT LOWER EXTREMITY: strength decreased, range of motion normal RIGHT LOWER EXTREMITY: strength decreased, range of motion normal  PSYCHIATRIC: the patient is alert & oriented to person, affect & behavior appropriate  LABS/RADIOLOGY: TSH, RPR normal.   Vitamin B12 level 168.  CK normal.  Head CT:  No acute findings.  Chest x-ray:  No acute disease.   Hemoglobin 12.3, MCV 79.4, otherwise CBC normal.   Glucose 158, otherwise BMP normal.   Troponin-I less than 0.03.   Urinalysis negative.   ASSESSMENT/PLAN:  Diabetes mellitus with neuropathy.  Well controlled.   Peripheral neuropathy.  Continue Neurontin.    Hypertension.  Well controlled.   History of CVA.  Continue aspirin.    Gait disorder.  Thought to be secondary to previous  cerebral infarct and neuropathy.  Continue rehabilitation.   Vitamin B12 deficiency.  New problem.  Started on B12 injections.    Microcytic anemia.  Reassess hemoglobin level.  Check iron studies.    Check CBC and BMP.   I have reviewed patient's medical records received at admission/from hospitalization.  CPT CODE: 16109

## 2012-06-04 ENCOUNTER — Telehealth: Payer: Self-pay

## 2012-06-04 NOTE — Telephone Encounter (Signed)
I would think this test needs to be ordered by his neurologist who took care of him in the hospital

## 2012-06-04 NOTE — Telephone Encounter (Signed)
Request was called in to get verbal orders for therapy 2 times a week for 4 weeks and f/u MBS study.

## 2012-06-04 NOTE — Telephone Encounter (Signed)
It is a modified barium swallow, to check his swallowing after his CVA . I was going to order the physical therapy extension, but I do not have a phone number to call them back.

## 2012-06-04 NOTE — Telephone Encounter (Signed)
I don't know what an MBS study is. It is okay to give the verbal order to proceed with physical therapy. He has a gait disturbance and previous CVA

## 2012-06-10 DIAGNOSIS — D638 Anemia in other chronic diseases classified elsewhere: Secondary | ICD-10-CM | POA: Insufficient documentation

## 2012-06-10 NOTE — Progress Notes (Signed)
Patient ID: Curtis Brock, male   DOB: [birth year], 79 y.o.   MRN: [id number]        PROGRESS NOTE  DATE: 05/20/2012  FACILITY:  [facility]  LEVEL OF CARE: SNF (31)  Acute Visit  CHIEF COMPLAINT:  Manage anemia.   HISTORY OF PRESENT ILLNESS: I was requested by the staff to assess the patient regarding above problem(s):  ANEMIA: On 05/19/2012, hemoglobin was 11, MCV 81.  On 05/10/2012, hemoglobin was 12.3.  The patient denies fatigue, melena or hematochezia. Patient is currently not on iron.    PAST MEDICAL HISTORY : Reviewed.  No changes.  CURRENT MEDICATIONS: Reviewed per [facility]  PHYSICAL EXAMINATION  GENERAL: no acute distress, moderately obese body habitus RESPIRATORY: breathing is even & unlabored, BS CTAB CARDIAC: RRR, no murmur,no extra heart sounds EDEMA/VARICOSITIES:  +2 bilateral lower extremity edema  ARTERIAL:  pedal pulses nonpalpable   ASSESSMENT/PLAN:  Anemia of chronic disease.  Hemoglobin declined.  We will monitor.    CPT CODE: 73220

## 2012-06-12 ENCOUNTER — Other Ambulatory Visit (HOSPITAL_COMMUNITY): Payer: Self-pay | Admitting: Emergency Medicine

## 2012-06-12 DIAGNOSIS — R1314 Dysphagia, pharyngoesophageal phase: Secondary | ICD-10-CM

## 2012-06-15 ENCOUNTER — Inpatient Hospital Stay (HOSPITAL_COMMUNITY): Admission: RE | Admit: 2012-06-15 | Payer: Medicare Other | Source: Ambulatory Visit

## 2012-06-15 ENCOUNTER — Ambulatory Visit (HOSPITAL_COMMUNITY): Payer: Medicare Other

## 2012-06-17 ENCOUNTER — Ambulatory Visit (INDEPENDENT_AMBULATORY_CARE_PROVIDER_SITE_OTHER): Payer: Medicare Other | Admitting: Emergency Medicine

## 2012-06-17 ENCOUNTER — Ambulatory Visit (HOSPITAL_COMMUNITY)
Admission: RE | Admit: 2012-06-17 | Discharge: 2012-06-17 | Disposition: A | Discharge status: Home / Self Care | Payer: Medicare Other | Source: Ambulatory Visit | Attending: Emergency Medicine | Admitting: Emergency Medicine

## 2012-06-17 ENCOUNTER — Other Ambulatory Visit (HOSPITAL_COMMUNITY): Payer: Self-pay | Admitting: Emergency Medicine

## 2012-06-17 VITALS — BP 126/60 | HR 105 | Resp 18 | Ht 65.0 in | Wt 215.0 lb

## 2012-06-17 DIAGNOSIS — I639 Cerebral infarction, unspecified: Secondary | ICD-10-CM

## 2012-06-17 DIAGNOSIS — R269 Unspecified abnormalities of gait and mobility: Secondary | ICD-10-CM

## 2012-06-17 DIAGNOSIS — E538 Deficiency of other specified B group vitamins: Secondary | ICD-10-CM

## 2012-06-17 DIAGNOSIS — R1310 Dysphagia, unspecified: Secondary | ICD-10-CM | POA: Insufficient documentation

## 2012-06-17 DIAGNOSIS — I6350 Cerebral infarction due to unspecified occlusion or stenosis of unspecified cerebral artery: Secondary | ICD-10-CM

## 2012-06-17 MED ORDER — LOSARTAN POTASSIUM 50 MG PO TABS: 50.0000 mg | ORAL_TABLET | Freq: Every day | ORAL | Status: DC

## 2012-06-17 NOTE — Progress Notes (Signed)
  Subjective:    Patient ID: Curtis Brock, male    DOB: 05-13-1925, 79 y.o.   MRN: [id number]  HPI Pt presents to clinic today with son-in-law, Fritz Pickerel.  Monday 06/15/12 Fritz Pickerel took Pt to have a Barium Swallow apt at [facility]- there was a miscommunication between the [facility] and the Pt's family. The order was not approved and Fritz Pickerel showed up with the Pt at [facility] on Monday, taking a day off work. He is here today with Pt to get the order signed for the Barium Swallow. Pt's son-in-law states the Pt began to have mental changes due to the Neurontin. Pt currently has stopped this med.    Review of Systems     Objective:   Physical Exam patient is alert and cooperative. He definitely has a gait disorder he has difficulty from going from sitting to standing by his walker. He has a shuffling gait. He was unstable without the assistance of his walker examination of the throat reveals a midline uvula. There are no focal neurological signs        Assessment & Plan:  Diabetes under good control. His losartan will be refilled. He is referred over to the hospital for a barium swallow .

## 2012-06-17 NOTE — Procedures (Signed)
Objective Swallowing Evaluation: Modified Barium Swallowing Study  Patient Details  Name: Curtis Brock MRN: [id number] Date of Birth: Jan 26, 1925  Today's Date: 06/17/2012 Time: [id number] SLP Time Calculation (min): 33 min  Past Medical History:  Past Medical History  Diagnosis Date  . Diabetes mellitus without complication    Past Surgical History: No past surgical history on file. HPI:  79 yo male referred for MBS due to concern pt may be having aspiration of thin liquids= occasional cough with thins reported by pt's son in law, Fritz Pickerel.  Pt with PMH of syncope, ? CVA, DM2, blindness, edema, recurrent falls & gait abnormality.  Pt admitted to hospital 04/2012 after falling at home, CXR showed ATX right base 05/09/12, CT head atrophy, chronic ischemia - moderate to advanced atrophy, basal ganglia chronic ischemia.   Son in Sports coach and pt deny pt having weight loss or pulmonary infections.  Pt reports eating anything and everything at home, stating "food is food."       Assessment / Plan / Recommendation Clinical Impression  Dysphagia Diagnosis: Mild oral phase dysphagia;Mild pharyngeal phase dysphagia;Mild cervical esophageal phase dysphagia  Clinical impression: Mild oropharygeal dysphagia characterized by mild weakness resulting in mild to moderate stasis to which pt sensed 90% of the time.  Cued & reflexive dry swallows and following solids with liquids aided to clear stasis.  Pt also appeared with partial mechanical obstruction from suspected cervical osteophytes at C2-C3, C3-C4, C4-C5, C5-C6 mildly impairing barium flow into esophagus (C5-C6 mostly impacting swallow).  Suspect as pt's functional reserve is compromised he had decreased ability to compensate for chronic mechanical issues.    Please note, pt did NOT cough throughout entire MBS and did not aspirate or penetrate any consistency tested.  He admits if he takes small bites and sips, he will not cough.  Advised pt to continue diet  (soft/thin) with strict aspiration compensations/precautions.        Treatment Recommendation    home health   Diet Recommendation Dysphagia 3 (Mechanical Soft);Thin liquid (extra gravies/sauce)   Liquid Administration via: Straw;Cup Medication Administration: Whole meds with puree Supervision: Patient able to self feed Compensations: Slow rate;Small sips/bites;Follow solids with liquid;Multiple dry swallows after each bite/sip (intermittent dry swallow) Postural Changes and/or Swallow Maneuvers: Seated upright 90 degrees;Upright 30-60 min after meal    Other  Recommendations   n/a  Follow Up Recommendations  Home health SLP           SLP Swallow Goals  n/a   General Date of Onset: 06/17/12 HPI: 79 yo male referred for MBS due to concern pt may be having aspiration of thin liquids= occasional cough with thins reported by pt's son in law, Fritz Pickerel.  Pt with PMH of syncope, ? CVA, DM2, blindness, edema, recurrent falls & gait abnormality.  Pt admitted to hospital 04/2012 after falling at home, CXR showed ATX right base 05/09/12, CT head atrophy, chronic ischemia - moderate to advanced atrophy, basal ganglia chronic ischemia.   Son in Sports coach and pt deny pt having weight loss or pulmonary infections.  Pt reports eating anything and everything at home, stating "food is food."   Type of Study: Modified Barium Swallowing Study Previous Swallow Assessment: ? MBS in mobile xray unit, pt states food was chopped up after that test Diet Prior to this Study: Regular;Thin liquids Temperature Spikes Noted: No Respiratory Status: Room air History of Recent Intubation: No Behavior/Cognition: Alert;Cooperative;Pleasant mood Oral Cavity - Dentition: Edentulous (pt does not wear dentures, eats regular diet  though) Oral Motor / Sensory Function: Within functional limits Self-Feeding Abilities: Able to feed self Patient Positioning: Upright in chair Baseline Vocal Quality: Clear Volitional Cough:  Strong Volitional Swallow: Able to elicit Anatomy: Other (Comment) (appearance of cervical osteophytes C2-C6) Pharyngeal Secretions: Not observed secondary MBS    Reason for Referral   concern for aspiration due to pt coughing with thin drinks  Oral Phase Oral Preparation/Oral Phase Oral Phase: WFL Oral - Nectar Oral - Nectar Cup: Within functional limits Oral - Thin Oral - Thin Cup: Piecemeal swallowing;Weak lingual manipulation Oral - Thin Straw: Piecemeal swallowing;Weak lingual manipulation Oral - Solids Oral - Puree: Weak lingual manipulation Oral - Regular: Weak lingual manipulation;Delayed oral transit Oral - Pill: Delayed oral transit;Piecemeal swallowing (taken with liquids, pt needed extra time to coordinate) Oral Phase - Comment Oral Phase - Comment: piecemeal deglutition occasionally viewed, premature spillage of oral residuals of liquid spill into pharynx with swallow triggering at pyriform sinus   Pharyngeal Phase Pharyngeal Phase Pharyngeal Phase: Impaired Pharyngeal - Thin Pharyngeal - Thin Cup: Within functional limits;Pharyngeal residue - pyriform sinuses Pharyngeal - Thin Straw: Within functional limits;Pharyngeal residue - pyriform sinuses Pharyngeal - Solids Pharyngeal - Puree: Premature spillage to valleculae;Pharyngeal residue - valleculae Pharyngeal - Regular: Pharyngeal residue - valleculae;Premature spillage to valleculae Pharyngeal - Pill: Within functional limits Pharyngeal Phase - Comment Pharyngeal Comment: dry and liquid swallows aid clearance of pharynx  Cervical Esophageal Phase    GO    Cervical Esophageal Phase Cervical Esophageal Phase: Impaired Cervical Esophageal Phase - Nectar Nectar Cup: Reduced cricopharyngeal relaxation Cervical Esophageal Phase - Thin Thin Cup: Reduced cricopharyngeal relaxation Thin Straw: Reduced cricopharyngeal relaxation Cervical Esophageal Phase - Solids Puree: Reduced cricopharyngeal relaxation Regular:  Reduced cricopharyngeal relaxation Pill: Within functional limits Cervical Esophageal Phase - Comment Cervical Esophageal Comment: appearance of cervical osteophytes impair barium flow mildly- barium appears to flow around osteophyte, decreased UES opening noted intermittently, barium tablet readily transited into esophagus to stomoach without delay    Functional Assessment Tool Used: mbs, clinical judgement Functional Limitations: Swallowing Swallow Current Status [id number]): At least 1 percent but less than 20 percent impaired, limited or restricted Swallow Goal Status [phone]): At least 1 percent but less than 20 percent impaired, limited or restricted Swallow Discharge Status [phone]): At least 1 percent but less than 20 percent impaired, limited or restricted   Luanna Salk, [place] [facility] SLP [phone]

## 2012-06-18 ENCOUNTER — Telehealth: Payer: Self-pay | Admitting: Emergency Medicine

## 2012-06-18 NOTE — Telephone Encounter (Signed)
Fritz Pickerel advised.

## 2012-06-18 NOTE — Telephone Encounter (Signed)
Called to advise. Left message for him to call back.

## 2012-06-18 NOTE — Telephone Encounter (Signed)
Please call son in law this afternoon. Patient to be on a soft diet. No other restrictions.

## 2012-07-11 ENCOUNTER — Other Ambulatory Visit: Payer: Self-pay | Admitting: Emergency Medicine

## 2012-07-13 NOTE — Telephone Encounter (Signed)
Patients daughter is calling us back again about leos rx

## 2012-07-20 ENCOUNTER — Other Ambulatory Visit: Payer: Self-pay | Admitting: Radiology

## 2012-07-28 ENCOUNTER — Encounter: Payer: Self-pay | Admitting: Emergency Medicine

## 2012-07-28 ENCOUNTER — Ambulatory Visit (INDEPENDENT_AMBULATORY_CARE_PROVIDER_SITE_OTHER): Payer: Medicare Other | Admitting: Emergency Medicine

## 2012-07-28 VITALS — BP 118/61 | HR 85 | Temp 98.3°F | Resp 18 | Ht 65.0 in | Wt 220.4 lb

## 2012-07-28 DIAGNOSIS — R609 Edema, unspecified: Secondary | ICD-10-CM

## 2012-07-28 DIAGNOSIS — E119 Type 2 diabetes mellitus without complications: Secondary | ICD-10-CM

## 2012-07-28 MED ORDER — FUROSEMIDE 20 MG PO TABS: 20.0000 mg | ORAL_TABLET | Freq: Every day | ORAL | Status: DC

## 2012-07-28 NOTE — Progress Notes (Signed)
  Subjective:    Patient ID: Curtis Brock, male    DOB: 1925-05-23, 79 y.o.   MRN: [id number]  HPI patient is currently staying with a son. He continues to undergo physical therapy for his legs. 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 According to the son his diet is terrible. He eats as much sweets as he can. He is currently off of all oral diabetic medications.    Review of Systems     Objective:   Physical Exam patient is alert and cheerful today. He has a significant gait disorder and walks bent over which a shuffling gait. His chest was clear his heart was regular rate no murmurs examination of feet reveals that he has on diabetic shoes I remove the stockings he has some early callus formation but no skin breakdown he has a neuropathy involving both feet. Pulses are maintained  Results for orders placed in visit on 07/28/12  GLUCOSE, POCT (MANUAL RESULT ENTRY)      Result Value Range   POC Glucose 146 (*) 70 - 99 mg/dl        Assessment & Plan:   Patient not currently on diabetes meds.we'll check a hemoglobin A1c next visit addendum glucose today was 146. I told the son he needs to be aggressive about restricting his diet

## 2012-10-13 ENCOUNTER — Ambulatory Visit (INDEPENDENT_AMBULATORY_CARE_PROVIDER_SITE_OTHER): Payer: Medicare Other | Admitting: Emergency Medicine

## 2012-10-13 ENCOUNTER — Encounter: Payer: Self-pay | Admitting: Emergency Medicine

## 2012-10-13 VITALS — BP 124/76 | HR 91 | Temp 97.9°F | Resp 16 | Wt 220.0 lb

## 2012-10-13 DIAGNOSIS — E119 Type 2 diabetes mellitus without complications: Secondary | ICD-10-CM

## 2012-10-13 DIAGNOSIS — H53132 Sudden visual loss, left eye: Secondary | ICD-10-CM

## 2012-10-13 DIAGNOSIS — Z23 Encounter for immunization: Secondary | ICD-10-CM

## 2012-10-13 DIAGNOSIS — H53139 Sudden visual loss, unspecified eye: Secondary | ICD-10-CM

## 2012-10-13 DIAGNOSIS — I10 Essential (primary) hypertension: Secondary | ICD-10-CM

## 2012-10-13 LAB — BASIC METABOLIC PANEL
BUN: 18 mg/dL (ref 6–23)
CO2: 28 mEq/L (ref 19–32)
Chloride: 103 mEq/L (ref 96–112)
Creat: 0.9 mg/dL (ref 0.50–1.35)
Glucose, Bld: 100 mg/dL — ABNORMAL HIGH (ref 70–99)

## 2012-10-13 NOTE — Progress Notes (Signed)
  Subjective:    Patient ID: Curtis Brock, male    DOB: Sep 29, 1925, 79 y.o.   MRN: [id number]  HPI patient here to follow up on his diabetes. He overall is doing well. He is scheduled to be seen at [facility] in the future for further evaluation of his loss of vision. In the left eye. He denies any chest pain or shortness of breath. He has had one fall since he was last here. He went out on the front porch steps without using his walker.   Review of Systems     Objective:   Physical Exam patient has some vision in the right. He has almost no vision in the left eye. Neck is supple chest clear heart regular no murmurs abdomen obese without tenderness extremities show 1+ edema. He has a neuropathy in both feet and his diabetic shoes on        Results for orders placed in visit on 10/13/12  GLUCOSE, POCT (MANUAL RESULT ENTRY)      Result Value Range   POC Glucose 114 (*) 70 - 99 mg/dl  POCT GLYCOSYLATED HEMOGLOBIN (HGB A1C)      Result Value Range   Hemoglobin A1C 6.9     Assessment & Plan:  His weight is up 5 pounds. His sugar is under acceptable control. He may need a higher dose of diuretic if he continues to have swelling in his legs

## 2013-01-12 ENCOUNTER — Ambulatory Visit: Payer: Self-pay | Admitting: Emergency Medicine

## 2013-01-19 ENCOUNTER — Ambulatory Visit (INDEPENDENT_AMBULATORY_CARE_PROVIDER_SITE_OTHER): Payer: Medicare Other | Admitting: Emergency Medicine

## 2013-01-19 ENCOUNTER — Encounter: Payer: Self-pay | Admitting: Emergency Medicine

## 2013-01-19 VITALS — BP 104/66 | HR 87 | Temp 97.9°F | Resp 16 | Wt 209.8 lb

## 2013-01-19 DIAGNOSIS — E1165 Type 2 diabetes mellitus with hyperglycemia: Principal | ICD-10-CM

## 2013-01-19 DIAGNOSIS — IMO0001 Reserved for inherently not codable concepts without codable children: Secondary | ICD-10-CM

## 2013-01-19 DIAGNOSIS — M109 Gout, unspecified: Secondary | ICD-10-CM

## 2013-01-19 DIAGNOSIS — R269 Unspecified abnormalities of gait and mobility: Secondary | ICD-10-CM

## 2013-01-19 LAB — POCT GLYCOSYLATED HEMOGLOBIN (HGB A1C): HEMOGLOBIN A1C: 6.2

## 2013-01-19 LAB — GLUCOSE, POCT (MANUAL RESULT ENTRY): POC GLUCOSE: 112 mg/dL — AB (ref 70–99)

## 2013-01-19 NOTE — Progress Notes (Signed)
   Subjective:    Patient ID: Curtis Brock, male    DOB: 27-Oct-1925, 80 y.o.   MRN: [id number]  HPI patient here to followup on his diabetes. He has a gait disorder following is CVA. He has significant difficulty with his vision and has been to [facility] for this. They have decided not to do surgery. He states he feels well. He has no specific complaints. He has been living with his son who has been very cautious about his diabetes and restricting his diet. He has had significant worsening in his gait disorder.    Review of Systems     Objective:   Physical Exam patient has a wide-based shuffling gait with significant difficulty walking and walks with the aid of a walker. His neck is supple his chest is clear his heart is regular rate without murmurs. Extremity exam reveals cracking between the toes but no ulcerations on his feet    Results for orders placed in visit on 01/19/13  GLUCOSE, POCT (MANUAL RESULT ENTRY)      Result Value Range   POC Glucose 112 (*) 70 - 99 mg/dl  POCT GLYCOSYLATED HEMOGLOBIN (HGB A1C)      Result Value Range   Hemoglobin A1C 6.2        Assessment & Plan:  He will use Lotrimin AF antifungal powder spray for his feet. His diabetes is under great control with an A1c of 6.2 and glucose of 112. Referral made for him to resume physical therapy for

## 2013-01-22 ENCOUNTER — Other Ambulatory Visit: Payer: Self-pay | Admitting: Radiology

## 2013-01-22 ENCOUNTER — Telehealth: Payer: Self-pay | Admitting: Radiology

## 2013-01-22 DIAGNOSIS — R269 Unspecified abnormalities of gait and mobility: Secondary | ICD-10-CM

## 2013-01-22 NOTE — Addendum Note (Signed)
Addended byCandice Camp on: 01/22/2013 04:45 PM   Modules accepted: Orders

## 2013-01-22 NOTE — Telephone Encounter (Signed)
Patient needs to continue home health, order in/ please advise if anything further required

## 2013-03-09 ENCOUNTER — Other Ambulatory Visit: Payer: Self-pay | Admitting: Emergency Medicine

## 2013-03-22 ENCOUNTER — Telehealth: Payer: Self-pay

## 2013-03-22 NOTE — Telephone Encounter (Signed)
Patients son in law Syliva Overman came by 104 looking to speak to Dr. Everlene Farrier. He wanted to ask him to possibly review patiens service records (in [place] & scanned just in case) and to speak to him about having a person come by the house and do physical therapy and bath him from time to time. Please advise. Please call, patient aware of Dr. Caren Griffins hours.   Larry's number:  [phone]

## 2013-03-23 NOTE — Telephone Encounter (Signed)
Patient was receiving home health physical therapy, has this ran out? If he needs this still, he will need a visit here to extend, must be seen within past 2 months to continue these services

## 2013-03-23 NOTE — Telephone Encounter (Signed)
LM for Fritz Pickerel- need Salesi to RTC to see Dr. Everlene Farrier

## 2013-03-23 NOTE — Telephone Encounter (Signed)
Dr. Everlene Farrier,  Have you received these records? I have left a message on Larry's number to see if there was a specific question he may have.

## 2013-03-24 NOTE — Telephone Encounter (Signed)
Left message on machine for Curtis Brock to call back.

## 2013-03-25 NOTE — Telephone Encounter (Signed)
Unable to reach pt's son in law by cell or home phone.

## 2013-04-01 ENCOUNTER — Telehealth: Payer: Self-pay | Admitting: *Deleted

## 2013-04-01 NOTE — Telephone Encounter (Signed)
Received paper work from patient being in the service. Need to find out what he needs. Left message for patient to return call [facility]

## 2013-04-05 ENCOUNTER — Other Ambulatory Visit: Payer: Self-pay | Admitting: Emergency Medicine

## 2013-04-05 NOTE — Telephone Encounter (Signed)
Dr Everlene Farrier, you saw pt in Jan, but don't see any recent B-12 labs. Do you want to give RFs or just one w/note to RTC for recheck or lab only?

## 2013-04-10 ENCOUNTER — Other Ambulatory Visit: Payer: Self-pay | Admitting: Emergency Medicine

## 2013-04-12 NOTE — Telephone Encounter (Signed)
I believe this has been scanned into his chart

## 2013-04-12 NOTE — Telephone Encounter (Signed)
Spoke to pt- pt will RTC to see Dr. Everlene Farrier.  Dr. Everlene Farrier do you have a copy of this paperwork?

## 2013-04-12 NOTE — Telephone Encounter (Signed)
Copy of paperwork is in the [facility] file on my desk- pt will be in to see you this week sometime.

## 2013-04-17 ENCOUNTER — Emergency Department (HOSPITAL_COMMUNITY): Payer: Medicare Other

## 2013-04-17 ENCOUNTER — Observation Stay (HOSPITAL_COMMUNITY)
Admission: EM | Admit: 2013-04-17 | Discharge: 2013-04-20 | Disposition: A | Discharge status: Skilled Nursing Facility | Payer: Medicare Other | Attending: Internal Medicine | Admitting: Internal Medicine

## 2013-04-17 ENCOUNTER — Encounter (HOSPITAL_COMMUNITY): Payer: Self-pay | Admitting: Emergency Medicine

## 2013-04-17 DIAGNOSIS — E1059 Type 1 diabetes mellitus with other circulatory complications: Secondary | ICD-10-CM

## 2013-04-17 DIAGNOSIS — R5383 Other fatigue: Principal | ICD-10-CM

## 2013-04-17 DIAGNOSIS — F29 Unspecified psychosis not due to a substance or known physiological condition: Secondary | ICD-10-CM | POA: Insufficient documentation

## 2013-04-17 DIAGNOSIS — R1110 Vomiting, unspecified: Secondary | ICD-10-CM

## 2013-04-17 DIAGNOSIS — R5381 Other malaise: Principal | ICD-10-CM | POA: Insufficient documentation

## 2013-04-17 DIAGNOSIS — R531 Weakness: Secondary | ICD-10-CM

## 2013-04-17 DIAGNOSIS — Z7982 Long term (current) use of aspirin: Secondary | ICD-10-CM | POA: Insufficient documentation

## 2013-04-17 DIAGNOSIS — I6990 Unspecified sequelae of unspecified cerebrovascular disease: Secondary | ICD-10-CM

## 2013-04-17 DIAGNOSIS — R7989 Other specified abnormal findings of blood chemistry: Secondary | ICD-10-CM

## 2013-04-17 DIAGNOSIS — E1149 Type 2 diabetes mellitus with other diabetic neurological complication: Secondary | ICD-10-CM

## 2013-04-17 DIAGNOSIS — R269 Unspecified abnormalities of gait and mobility: Secondary | ICD-10-CM

## 2013-04-17 DIAGNOSIS — G629 Polyneuropathy, unspecified: Secondary | ICD-10-CM

## 2013-04-17 DIAGNOSIS — J189 Pneumonia, unspecified organism: Secondary | ICD-10-CM

## 2013-04-17 DIAGNOSIS — Z8673 Personal history of transient ischemic attack (TIA), and cerebral infarction without residual deficits: Secondary | ICD-10-CM | POA: Insufficient documentation

## 2013-04-17 DIAGNOSIS — I639 Cerebral infarction, unspecified: Secondary | ICD-10-CM | POA: Diagnosis present

## 2013-04-17 DIAGNOSIS — R197 Diarrhea, unspecified: Secondary | ICD-10-CM

## 2013-04-17 DIAGNOSIS — I10 Essential (primary) hypertension: Secondary | ICD-10-CM | POA: Insufficient documentation

## 2013-04-17 DIAGNOSIS — R945 Abnormal results of liver function studies: Secondary | ICD-10-CM

## 2013-04-17 DIAGNOSIS — M47814 Spondylosis without myelopathy or radiculopathy, thoracic region: Secondary | ICD-10-CM | POA: Insufficient documentation

## 2013-04-17 DIAGNOSIS — H5440 Blindness, one eye, unspecified eye: Secondary | ICD-10-CM | POA: Insufficient documentation

## 2013-04-17 DIAGNOSIS — E860 Dehydration: Secondary | ICD-10-CM | POA: Insufficient documentation

## 2013-04-17 DIAGNOSIS — E119 Type 2 diabetes mellitus without complications: Secondary | ICD-10-CM | POA: Insufficient documentation

## 2013-04-17 DIAGNOSIS — N179 Acute kidney failure, unspecified: Secondary | ICD-10-CM | POA: Insufficient documentation

## 2013-04-17 DIAGNOSIS — E785 Hyperlipidemia, unspecified: Secondary | ICD-10-CM | POA: Insufficient documentation

## 2013-04-17 DIAGNOSIS — R1310 Dysphagia, unspecified: Secondary | ICD-10-CM | POA: Insufficient documentation

## 2013-04-17 DIAGNOSIS — D638 Anemia in other chronic diseases classified elsewhere: Secondary | ICD-10-CM

## 2013-04-17 DIAGNOSIS — G609 Hereditary and idiopathic neuropathy, unspecified: Secondary | ICD-10-CM

## 2013-04-17 DIAGNOSIS — R112 Nausea with vomiting, unspecified: Secondary | ICD-10-CM | POA: Insufficient documentation

## 2013-04-17 LAB — CBC WITH DIFFERENTIAL/PLATELET
Basophils Absolute: 0 10*3/uL (ref 0.0–0.1)
Basophils Relative: 0 % (ref 0–1)
EOS ABS: 0 10*3/uL (ref 0.0–0.7)
Eosinophils Relative: 0 % (ref 0–5)
HCT: 36.9 % — ABNORMAL LOW (ref 39.0–52.0)
Hemoglobin: 12.7 g/dL — ABNORMAL LOW (ref 13.0–17.0)
LYMPHS ABS: 0.4 10*3/uL — AB (ref 0.7–4.0)
LYMPHS PCT: 7 % — AB (ref 12–46)
MCH: 28 pg (ref 26.0–34.0)
MCHC: 34.4 g/dL (ref 30.0–36.0)
MCV: 81.3 fL (ref 78.0–100.0)
Monocytes Absolute: 0.3 10*3/uL (ref 0.1–1.0)
Monocytes Relative: 5 % (ref 3–12)
NEUTROS ABS: 5.5 10*3/uL (ref 1.7–7.7)
NEUTROS PCT: 88 % — AB (ref 43–77)
PLATELETS: 177 10*3/uL (ref 150–400)
RBC: 4.54 MIL/uL (ref 4.22–5.81)
RDW: 13.9 % (ref 11.5–15.5)
WBC: 6.2 10*3/uL (ref 4.0–10.5)

## 2013-04-17 LAB — URINALYSIS, ROUTINE W REFLEX MICROSCOPIC
GLUCOSE, UA: 100 mg/dL — AB
Hgb urine dipstick: NEGATIVE
KETONES UR: 15 mg/dL — AB
Leukocytes, UA: NEGATIVE
Nitrite: NEGATIVE
PH: 5 (ref 5.0–8.0)
Protein, ur: NEGATIVE mg/dL
Specific Gravity, Urine: 1.025 (ref 1.005–1.030)
Urobilinogen, UA: 0.2 mg/dL (ref 0.0–1.0)

## 2013-04-17 LAB — COMPREHENSIVE METABOLIC PANEL
ALK PHOS: 73 U/L (ref 39–117)
ALT: 9 U/L (ref 0–53)
AST: 18 U/L (ref 0–37)
Albumin: 3.2 g/dL — ABNORMAL LOW (ref 3.5–5.2)
BUN: 23 mg/dL (ref 6–23)
CHLORIDE: 103 meq/L (ref 96–112)
CO2: 22 mEq/L (ref 19–32)
Calcium: 9 mg/dL (ref 8.4–10.5)
Creatinine, Ser: 1.4 mg/dL — ABNORMAL HIGH (ref 0.50–1.35)
GFR calc non Af Amer: 44 mL/min — ABNORMAL LOW (ref >90)
GFR, EST AFRICAN AMERICAN: 51 mL/min — AB (ref >90)
GLUCOSE: 195 mg/dL — AB (ref 70–99)
POTASSIUM: 3.9 meq/L (ref 3.7–5.3)
SODIUM: 142 meq/L (ref 137–147)
TOTAL PROTEIN: 7 g/dL (ref 6.0–8.3)
Total Bilirubin: 0.4 mg/dL (ref 0.3–1.2)

## 2013-04-17 LAB — KETONES, QUALITATIVE: ACETONE BLD: NEGATIVE

## 2013-04-17 LAB — LIPASE, BLOOD: Lipase: 22 U/L (ref 11–59)

## 2013-04-17 LAB — LACTIC ACID, PLASMA: LACTIC ACID, VENOUS: 3.6 mmol/L — AB (ref 0.5–2.2)

## 2013-04-17 LAB — TROPONIN I: Troponin I: <0.3 ng/mL (ref <0.30)

## 2013-04-17 MED ORDER — MORPHINE SULFATE 2 MG/ML IJ SOLN
2.0000 mg | INTRAMUSCULAR | Status: DC | PRN
  Administered 2013-04-17 (×2): 2 mg via INTRAVENOUS
  Filled 2013-04-17 (×2): qty 1

## 2013-04-17 MED ORDER — SODIUM CHLORIDE 0.9 % IV SOLN
INTRAVENOUS | Status: DC
  Administered 2013-04-17: 22:00:00 via INTRAVENOUS

## 2013-04-17 MED ORDER — IOHEXOL 300 MG/ML  SOLN
100.0000 mL | Freq: Once | INTRAMUSCULAR | Status: AC | PRN
  Administered 2013-04-17: 100 mL via INTRAVENOUS

## 2013-04-17 MED ORDER — ONDANSETRON HCL 4 MG/2ML IJ SOLN
4.0000 mg | Freq: Once | INTRAMUSCULAR | Status: AC
  Administered 2013-04-17: 4 mg via INTRAVENOUS
  Filled 2013-04-17: qty 2

## 2013-04-17 MED ORDER — IOHEXOL 300 MG/ML  SOLN
25.0000 mL | Freq: Once | INTRAMUSCULAR | Status: AC | PRN
  Administered 2013-04-17: 25 mL via ORAL

## 2013-04-17 MED ORDER — SODIUM CHLORIDE 0.9 % IV BOLUS (SEPSIS)
1000.0000 mL | Freq: Once | INTRAVENOUS | Status: AC
  Administered 2013-04-17: 1000 mL via INTRAVENOUS

## 2013-04-17 NOTE — ED Notes (Signed)
Phlebotomy back at the bedside.

## 2013-04-17 NOTE — ED Notes (Signed)
Admitting MD at bedside.

## 2013-04-17 NOTE — H&P (Addendum)
PCP:  Dr Josepha Pigg Code Status: Full Code  Chief Complaint:  Confusion, weakness  HPI: This is a 80 y/o male who this AM had some mild confusion, he did not remember who was playing [place] in the basketball game and at one point thought he was upstairs when he was downstairs. This apparently passed quickly. Later that day he had an episode of nausea and vomiting, no hematemesis.  He later became weak, a general weakness not localized. The patient uses a cane and walker equally at home,but he states he is usually able to get up from chair with no assistance, he climbs the stairs in the home, so this is unusual. He reports chronic back pain, which he says has been worse over the past week. He states he usually takes a little brown pill that takes the pain away but he has not been taking it recently and doesn't remember the name,neither does his daughter. He denies any incontinence urinary or bladder.   Here i the ER the patient appeared very dry he was given 2L NS, attempt was made to ambulate patient, he remains weak, the hosptalist has been asked to observe overnight  Review of Systems:  The patient denies anorexia, fever, weight loss,, vision loss, decreased hearing, hoarseness, chest pain, syncope, dyspnea on exertion, peripheral edema, balance deficits, hemoptysis, abdominal pain, melena, hematochezia, severe indigestion/heartburn, hematuria, incontinence, genital sores, muscle weakness, suspicious skin lesions, transient blindness, difficulty walking, depression, unusual weight change, abnormal bleeding, enlarged lymph nodes, angioedema, and breast masses.  Past Medical History: Past Medical History  Diagnosis Date  . Diabetes mellitus without complication   Hypertension   History reviewed. No pertinent past surgical history.  Medications: Prior to Admission medications   Medication Sig Start Date End Date Taking? Authorizing Provider  aspirin EC 81 MG tablet Take 81 mg by mouth daily.   Yes  Historical Provider, MD  furosemide (LASIX) 20 MG tablet Take 20 mg by mouth daily.   Yes Historical Provider, MD  losartan (COZAAR) 50 MG tablet Take 1 tablet (50 mg total) by mouth daily. 06/17/12  Yes Darlyne Russian, MD  cyanocobalamin (,VITAMIN B-12,) 1000 MCG/ML injection INJECT 1 ML AS DIRECTED EVERY MONTH 04/05/13   Darlyne Russian, MD    Allergies:  No Known Allergies  Social History:  reports that he has never smoked. He does not have any smokeless tobacco history on file. He reports that he does not drink alcohol or use illicit drugs. lives with daughter. Uses a cane and walker equally.   Family History: Patient denies  Physical Exam: Filed Vitals:   04/17/13 1800 04/17/13 1913 04/17/13 1915 04/17/13 2100  BP: 133/67 137/108 151/88 138/69  Pulse: 100  101 106  Temp:      TempSrc:      Resp: 27 18 24 19   SpO2: 95% 97% 96% 95%    General:  Alert and oriented times three, well developed and nourished, no acute distress Eyes: PERRLA, pink conjunctiva, no scleral icterus ENT: Moist oral mucosa, neck supple, no thyromegaly Lungs: clear to ascultation, no wheeze, no crackles, no use of accessory muscles Cardiovascular: regular rate and rhythm, no regurgitation, no gallops, no murmurs. No carotid bruits, no JVD Abdomen: soft, positive BS, non-tender, non-distended, no organomegaly, not an acute abdomen GU: not examined Neuro: CN II - XII grossly intact, sensation intact Musculoskeletal: generalized weakness of a 80 y/o but LLE 3/5 (reports back pain associated), RLE 4/5, no clubbing, cyanosis or edema Skin: no rash,  no subcutaneous crepitation, no decubitus Psych: appropriate patient   Labs on Admission:   Recent Labs  04/17/13 1500  NA 142  K 3.9  CL 103  CO2 22  GLUCOSE 195*  BUN 23  CREATININE 1.40*  CALCIUM 9.0    Recent Labs  04/17/13 1500  AST 18  ALT 9  ALKPHOS 73  BILITOT 0.4  PROT 7.0  ALBUMIN 3.2*    Recent Labs  04/17/13 1500  LIPASE 22     Recent Labs  04/17/13 1500  WBC 6.2  NEUTROABS 5.5  HGB 12.7*  HCT 36.9*  MCV 81.3  PLT 177    Recent Labs  04/17/13 1500  TROPONINI <0.30   No components found with this basename: POCBNP,  No results found for this basename: DDIMER,  in the last 72 hours No results found for this basename: HGBA1C,  in the last 72 hours No results found for this basename: CHOL, HDL, LDLCALC, TRIG, CHOLHDL, LDLDIRECT,  in the last 72 hours No results found for this basename: TSH, T4TOTAL, FREET3, T3FREE, THYROIDAB,  in the last 72 hours No results found for this basename: VITAMINB12, FOLATE, FERRITIN, TIBC, IRON, RETICCTPCT,  in the last 72 hours  Micro Results:  Results for NAITHEN, SCHUPPE (MRN [id number]) as of 04/17/2013 22:53  Ref. Range 04/17/2013 18:25  Color, Urine Latest Range: YELLOW  AMBER (A)  APPearance Latest Range: CLEAR  CLOUDY (A)  Specific Gravity, Urine Latest Range: 1.005-1.030  1.025  pH Latest Range: 5.0-8.0  5.0  Glucose Latest Range: NEGATIVE mg/dL 100 (A)  Bilirubin Urine Latest Range: NEGATIVE  SMALL (A)  Ketones, ur Latest Range: NEGATIVE mg/dL 15 (A)  Protein Latest Range: NEGATIVE mg/dL NEGATIVE  Urobilinogen, UA Latest Range: 0.0-1.0 mg/dL 0.2  Nitrite Latest Range: NEGATIVE  NEGATIVE  Leukocytes, UA Latest Range: NEGATIVE  NEGATIVE  Hgb urine dipstick Latest Range: NEGATIVE  NEGATIVE    Radiological Exams on Admission: Dg Chest 2 View  04/17/2013   CLINICAL DATA:  Malaise  EXAM: CHEST  2 VIEW  COMPARISON:  May 09, 2012  FINDINGS: There is stable eventration of the right hemidiaphragm. There is no edema or consolidation. Heart is upper normal in size with normal pulmonary vascularity. No adenopathy.  IMPRESSION: No edema or consolidation.   Electronically Signed   By: Lowella Grip M.D.   On: 04/17/2013 19:03   Ct Abdomen Pelvis W Contrast  04/17/2013   CLINICAL DATA:  FATIGUE EMESIS DIARRHEA  EXAM: CT ABDOMEN AND PELVIS WITH CONTRAST  TECHNIQUE:  Multidetector CT imaging of the abdomen and pelvis was performed using the standard protocol following bolus administration of intravenous contrast.  CONTRAST:  172mL OMNIPAQUE IOHEXOL 300 MG/ML  SOLN  COMPARISON:  None.  FINDINGS: Ill-defined pulmonary opacities scattered throughout the visualized lung bases.  The hepatic flexure is interposed between the anterior portion of the liver and the abdominal wall.  A small benign appearing 5 mm low attenuating focus centrally right lobe of the liver likely reflecting benign cysts. Liver is otherwise unremarkable. Calcified gallstone within the neck of the gallbladder.  The spleen, pancreas, kidneys are unremarkable. The adrenals demonstrate a bulbous configuration and maintained an adreniform shape.  Scattered diverticula are appreciated within the large bowel which is otherwise negative. The appendix is identified and is negative.  No abdominal or pelvic masses, free fluid, loculated fluid collections nor adenopathy appreciated.  The celiac, SMA, IMA, portal vein, SMV are opacified.  No evidence of abdominal aortic aneurysm. Mural calcifications  and noncalcified mural thrombus within the aorta.  A fat containing umbilical hernia is appreciated.  There are no aggressive appearing osseous lesions. Multilevel spondylosis within the thoracolumbar spine.  IMPRESSION: 1. Ill-defined pulmonary opacities within the lung bases differential considerations are pneumonitis. Further evaluation with dedicated PA and lateral chest radiograph recommended. 2. Chronic changes within the abdomen and pelvis. No evidence of focal or acute abnormalities appreciated. No CT evidence accounting for the patient's clinical presentation. 3. Fat containing umbilical hernia 4. Multilevel spondylosis within the thoracolumbar spine 5. Chilaiditic anatomy   Electronically Signed   By: Margaree Mackintosh M.D.   On: 04/17/2013 20:28    Assessment/Plan Present on Admission:  . Weakness/back pain -PT  consulted -PRN pain medication -MRI back lumbar spine if discomfort does not resolve  Transient confusion -CT head negative Dehydration Acute kidney injury -continue IVF hydration gently -d/c PO lasix Nausea and vomiting -resolved, single episode, monitor . Diabetes mellitus -MD discontinued diabetic medications -ADA diet and SSI ordered . Essential hypertension, benign -home medication resumed   Curtis Brock 04/17/2013, 10:45 PM

## 2013-04-17 NOTE — ED Provider Notes (Signed)
TIME SEEN: 3:13 PM  CHIEF COMPLAINT: One episode of vomiting, one episode of diarrhea, generalized weakness  HPI: Patient is an 80 year old male with a history of diabetes who presents emergency department with one episode of vomiting, one episode of diarrhea today. He is also complaining of generalized weakness and polydipsia. He is also complaining of chronic lower back pain. No new back pain. No new injury. Denies chest pain, shortness of breath, abdominal pain, bloody stool or melena, dysuria or hematuria, numbness or focal weakness, bowel or bladder incontinence.  ROS: See HPI Constitutional: no fever  Eyes: no drainage  ENT: no runny nose   Cardiovascular:  no chest pain  Resp: no SOB  GI: vomiting GU: no dysuria Integumentary: no rash  Allergy: no hives  Musculoskeletal: no leg swelling  Neurological: no slurred speech ROS otherwise negative  PAST MEDICAL HISTORY/PAST SURGICAL HISTORY:  Past Medical History  Diagnosis Date  . Diabetes mellitus without complication     MEDICATIONS:  Prior to Admission medications   Medication Sig Start Date End Date Taking? Authorizing Provider  aspirin EC 81 MG tablet Take 81 mg by mouth daily.    Historical Provider, MD  cyanocobalamin (,VITAMIN B-12,) 1000 MCG/ML injection INJECT 1 ML AS DIRECTED EVERY MONTH 04/05/13   Darlyne Russian, MD  furosemide (LASIX) 20 MG tablet TAKE 1 TABLET (20 MG TOTAL) BY MOUTH DAILY.    Ryan M Dunn, PA-C  losartan (COZAAR) 50 MG tablet Take 1 tablet (50 mg total) by mouth daily. 06/17/12   Darlyne Russian, MD  moxifloxacin (VIGAMOX) 0.5 % ophthalmic solution Place 1 drop into both eyes 4 (four) times daily.    Historical Provider, MD  sodium chloride (MURO 128) 5 % ophthalmic solution Place 1 drop into the left eye 4 (four) times daily. 05/11/12   Renee A Kuneff, DO    ALLERGIES:  No Known Allergies  SOCIAL HISTORY:  History  Substance Use Topics  . Smoking status: Never Smoker   . Smokeless tobacco: Not on  file  . Alcohol Use: No    FAMILY HISTORY: No family history on file.  EXAM: BP 101/57  Pulse 102  Temp(Src) 98.4 F (36.9 C) (Axillary)  Resp 24  SpO2 98% CONSTITUTIONAL: Alert and oriented and responds appropriately to questions. Well-appearing; well-nourished, elderly, in no apparent distress HEAD: Normocephalic EYES: Conjunctivae clear, PERRL ENT: normal nose; no rhinorrhea; dry mucous membranes; pharynx without lesions noted NECK: Supple, no meningismus, no LAD  CARD: Regular and tachycardic; S1 and S2 appreciated; no murmurs, no clicks, no rubs, no gallops RESP: Normal chest excursion without splinting or tachypnea; breath sounds clear and equal bilaterally; no wheezes, no rhonchi, no rales,  ABD/GI: Normal bowel sounds; non-distended; soft, non-tender, no rebound, no guarding BACK:  The back appears normal and is non-tender to palpation, there is no CVA tenderness EXT: Normal ROM in all joints; non-tender to palpation; no edema; normal capillary refill; no cyanosis    SKIN: Normal color for age and race; warm NEURO: Moves all extremities equally cranial nerves II through XII intact, sensation to light touch intact diffusely PSYCH: The patient's mood and manner are appropriate. Grooming and personal hygiene are appropriate.  MEDICAL DECISION MAKING: Patient here with vomiting, diarrhea. He is also denies weakness and polydipsia. His glucose was EMS was 298. We'll check basic labs, troponin, urine, lactate. We'll give IV fluids and Zofran. EKG shows no ischemic changes.  ED PROGRESS: Patient is orthostatic and has a slightly elevated creatinine. We'll  give second liter of IV fluids. His glucose is 195. He has an anion gap of 17.   4:48 PM  Pt has elevated lactate. We'll obtain a CT of his abdomen and pelvis as he has mild tenderness diffusely currently. He is still mildly tachycardic and receiving a second liter of IV fluids. His PCP is Dr. Everlene Farrier.  Updated patient and his  daughter at bedside.   10:03 PM  Patient is still not feeling well and states he feels that he cannot walk on his own without becoming symptomatic. CT scan shows no acute abnormality. Chest x-ray clear. Spoke to Dr. Claria Dice on the hospitalist, who will see in ED. Will admit for continued IV hydration. Daughter is [name] [phone], [phone].   Date: 04/17/2013 14:30  Rate: 101  Rhythm: Sinus tachycardia  QRS Axis: normal  Intervals: normal  ST/T Wave abnormalities: normal  Conduction Disutrbances: none  Narrative Interpretation: Sinus tachycardia, artifact, nonspecific ST changes      Kief, DO 04/17/13 2323

## 2013-04-17 NOTE — ED Notes (Signed)
Pt given contrast

## 2013-04-17 NOTE — ED Notes (Signed)
Per [facility], pt has been more weak today from N/V/D and lives with daughter and son-in-law in basement independently except for meals. Pt was seen at the bottom of the steps sitting at them. He has had increased thirst and just felt weak all over. Per EMS, diminished in LLL and RLL. Has a 20g to [facility]. C/o pain to his lower back. States vomited x 1 and loose stool x 1. Given 100 ml NS by EMS. Patient is A/O x 4

## 2013-04-17 NOTE — ED Notes (Signed)
Family at bedside. 

## 2013-04-18 ENCOUNTER — Observation Stay (HOSPITAL_COMMUNITY): Payer: Medicare Other

## 2013-04-18 DIAGNOSIS — D638 Anemia in other chronic diseases classified elsewhere: Secondary | ICD-10-CM

## 2013-04-18 DIAGNOSIS — E119 Type 2 diabetes mellitus without complications: Secondary | ICD-10-CM

## 2013-04-18 DIAGNOSIS — I10 Essential (primary) hypertension: Secondary | ICD-10-CM

## 2013-04-18 LAB — GLUCOSE, CAPILLARY
GLUCOSE-CAPILLARY: 136 mg/dL — AB (ref 70–99)
Glucose-Capillary: 132 mg/dL — ABNORMAL HIGH (ref 70–99)
Glucose-Capillary: 164 mg/dL — ABNORMAL HIGH (ref 70–99)
Glucose-Capillary: 124 mg/dL — ABNORMAL HIGH (ref 70–99)

## 2013-04-18 LAB — BASIC METABOLIC PANEL
BUN: 22 mg/dL (ref 6–23)
CHLORIDE: 104 meq/L (ref 96–112)
CO2: 24 mEq/L (ref 19–32)
Calcium: 8.7 mg/dL (ref 8.4–10.5)
Creatinine, Ser: 1.02 mg/dL (ref 0.50–1.35)
GFR calc non Af Amer: 65 mL/min — ABNORMAL LOW (ref >90)
GFR, EST AFRICAN AMERICAN: 75 mL/min — AB (ref >90)
Glucose, Bld: 129 mg/dL — ABNORMAL HIGH (ref 70–99)
Potassium: 3.9 mEq/L (ref 3.7–5.3)
SODIUM: 142 meq/L (ref 137–147)

## 2013-04-18 LAB — CBC
HCT: 32 % — ABNORMAL LOW (ref 39.0–52.0)
Hemoglobin: 11 g/dL — ABNORMAL LOW (ref 13.0–17.0)
MCH: 27.8 pg (ref 26.0–34.0)
MCHC: 34.4 g/dL (ref 30.0–36.0)
MCV: 80.8 fL (ref 78.0–100.0)
Platelets: 180 10*3/uL (ref 150–400)
RBC: 3.96 MIL/uL — ABNORMAL LOW (ref 4.22–5.81)
RDW: 14 % (ref 11.5–15.5)
WBC: 9.7 10*3/uL (ref 4.0–10.5)

## 2013-04-18 MED ORDER — MORPHINE SULFATE 2 MG/ML IJ SOLN: 2.0000 mg | INTRAMUSCULAR | Status: DC | PRN

## 2013-04-18 MED ORDER — ALUM & MAG HYDROXIDE-SIMETH 200-200-20 MG/5ML PO SUSP: 30.0000 mL | Freq: Four times a day (QID) | ORAL | Status: DC | PRN

## 2013-04-18 MED ORDER — ACETAMINOPHEN 325 MG PO TABS: 650.0000 mg | ORAL_TABLET | Freq: Four times a day (QID) | ORAL | Status: DC | PRN

## 2013-04-18 MED ORDER — ONDANSETRON HCL 4 MG/2ML IJ SOLN: 4.0000 mg | Freq: Four times a day (QID) | INTRAMUSCULAR | Status: DC | PRN

## 2013-04-18 MED ORDER — LOSARTAN POTASSIUM 50 MG PO TABS
50.0000 mg | ORAL_TABLET | Freq: Every day | ORAL | Status: DC
Start: 2013-04-18 — End: 2013-04-20
  Administered 2013-04-18 – 2013-04-20 (×3): 50 mg via ORAL
  Filled 2013-04-18 (×3): qty 1

## 2013-04-18 MED ORDER — ONDANSETRON HCL 4 MG PO TABS: 4.0000 mg | ORAL_TABLET | Freq: Four times a day (QID) | ORAL | Status: DC | PRN

## 2013-04-18 MED ORDER — OXYCODONE HCL 5 MG PO TABS: 5.0000 mg | ORAL_TABLET | ORAL | Status: DC | PRN

## 2013-04-18 MED ORDER — ASPIRIN EC 81 MG PO TBEC
81.0000 mg | DELAYED_RELEASE_TABLET | Freq: Every day | ORAL | Status: DC
  Administered 2013-04-18 – 2013-04-20 (×3): 81 mg via ORAL
  Filled 2013-04-18 (×3): qty 1

## 2013-04-18 MED ORDER — DOCUSATE SODIUM 100 MG PO CAPS
100.0000 mg | ORAL_CAPSULE | Freq: Two times a day (BID) | ORAL | Status: DC
  Administered 2013-04-18 – 2013-04-20 (×4): 100 mg via ORAL
  Filled 2013-04-18 (×5): qty 1

## 2013-04-18 MED ORDER — SODIUM CHLORIDE 0.9 % IV SOLN
INTRAVENOUS | Status: AC
  Administered 2013-04-18: 1000 mL via INTRAVENOUS

## 2013-04-18 MED ORDER — ACETAMINOPHEN 650 MG RE SUPP: 650.0000 mg | Freq: Four times a day (QID) | RECTAL | Status: DC | PRN

## 2013-04-18 MED ORDER — HEPARIN SODIUM (PORCINE) 5000 UNIT/ML IJ SOLN
5000.0000 [IU] | Freq: Three times a day (TID) | INTRAMUSCULAR | Status: DC
  Administered 2013-04-18 – 2013-04-20 (×6): 5000 [IU] via SUBCUTANEOUS
  Filled 2013-04-18 (×10): qty 1

## 2013-04-18 NOTE — Evaluation (Signed)
Physical Therapy Evaluation Patient Details Name: Curtis Brock MRN: [id number] DOB: Aug 03, 1925 Today's Date: 04/18/2013   History of Present Illness  This is a 80 y/o male who this AM had some mild confusion, he did not remember who was playing [employer] in the basketball game and at one point thought he was upstairs when he was downstairs. This apparently passed quickly. Later that day he had an episode of nausea and vomiting, no hematemesis.  He later became weak, a general weakness not localized. The patient uses a cane and walker equally at home,but he states he is usually able to get up from chair with no assistance, he climbs the stairs in the home, so this is unusual. He reports chronic back pain, which he says has been worse over the past week.  Patient admitted with weakness, dehydration, AKI, transient confusion.  Clinical Impression  Patient presents with problems listed below.  Will benefit from acute PT to maximize independence prior to discharge.  Patient is alone during day - son-in-law works.  Patient requires mod to max assist for all mobility/gait today.  Do not feel patient is safe to be alone at this time.  Recommend SNF at discharge for continued therapy prior to returning home.    Follow Up Recommendations SNF;Supervision/Assistance - 24 hour    Equipment Recommendations  None recommended by PT    Recommendations for Other Services       Precautions / Restrictions Precautions Precautions: Fall Precaution Comments: h/o falls at home Restrictions Weight Bearing Restrictions: No      Mobility  Bed Mobility Overal bed mobility: Needs Assistance Bed Mobility: Supine to Sit;Sit to Supine     Supine to sit: Mod assist Sit to supine: Max assist   General bed mobility comments: Verbal cues to roll and move to sitting.  Patient unable to follow these directions.  Reaches for PT's hand to pull up to sitting, requiring mod assist to move LE's off of bed and raise trunk to  sitting.  When returning to supine, instructed patient to move shoulders to pillow with PT assisting with LE's.  Patient unable to follow directions, and laid straight back on bed (laying across bed).  Max assist to move shoulders up to pillow and to bring LE's onto bed.  Transfers Overall transfer level: Needs assistance Equipment used: Rolling walker (2 wheeled) Transfers: Sit to/from Stand Sit to Stand: Max assist         General transfer comment: Verbal cues for hand placement.  Max assist to rise to standing position.  Once upright, min assist for balance.    Ambulation/Gait Ambulation/Gait assistance: Mod assist Ambulation Distance (Feet): 20 Feet Assistive device: Rolling walker (2 wheeled) Gait Pattern/deviations: Step-through pattern;Decreased step length - right;Decreased step length - left;Decreased stride length;Shuffle;Leaning posteriorly;Trunk flexed Gait velocity: Decreased Gait velocity interpretation: Below normal speed for age/gender General Gait Details: Verbal cues for safe use of RW.  Cues to stand upright.  Patient with flexed posture during gait, including hips/knees.  Patient loses balance posteriorly, requiring mod assist x2 to prevent fall during gait.    Stairs            Wheelchair Mobility    Modified Rankin (Stroke Patients Only)       Balance Overall balance assessment: Needs assistance         Standing balance support: Bilateral upper extremity supported Standing balance-Leahy Scale: Poor  Pertinent Vitals/Pain     Home Living Family/patient expects to be discharged to:: Skilled nursing facility Living Arrangements: Children               Additional Comments: Son-in-law works.  Patient is home alone during day.    Prior Function Level of Independence: Independent with assistive device(s);Needs assistance   Gait / Transfers Assistance Needed: Patient reports he gets around with his  cane and can go up/down stairs without assist  ADL's / Homemaking Assistance Needed: Reports he does his bathing and dressing on his own.  Son-in-law cooks meals.        Hand Dominance        Extremity/Trunk Assessment   Upper Extremity Assessment: Generalized weakness (Decreased shoulder ROM bil.)           Lower Extremity Assessment: Generalized weakness         Communication   Communication: No difficulties  Cognition Arousal/Alertness: Awake/alert Behavior During Therapy: WFL for tasks assessed/performed Overall Cognitive Status: Impaired/Different from baseline Area of Impairment: Orientation;Memory;Safety/judgement;Awareness Orientation Level: Disoriented to;Place;Time   Memory: Decreased short-term memory   Safety/Judgement: Decreased awareness of deficits;Decreased awareness of safety     General Comments: Patient repeating himself during conversation and when asked specific questions.    General Comments      Exercises        Assessment/Plan    PT Assessment Patient needs continued PT services  PT Diagnosis Difficulty walking;Abnormality of gait;Generalized weakness;Acute pain;Altered mental status   PT Problem List Decreased strength;Decreased range of motion;Decreased activity tolerance;Decreased balance;Decreased mobility;Decreased cognition;Decreased knowledge of use of DME;Decreased safety awareness;Pain  PT Treatment Interventions DME instruction;Gait training;Functional mobility training;Therapeutic exercise;Balance training;Cognitive remediation;Patient/family education   PT Goals (Current goals can be found in the Care Plan section) Acute Rehab PT Goals Patient Stated Goal: None stated PT Goal Formulation: With patient Time For Goal Achievement: 05/02/13 Potential to Achieve Goals: Good    Frequency Min 3X/week   Barriers to discharge Decreased caregiver support Patient is home alone during day    Co-evaluation                End of Session Equipment Utilized During Treatment: Gait belt Activity Tolerance: Patient limited by fatigue Patient left: in bed;with call bell/phone within reach;with bed alarm set Nurse Communication: Mobility status    Functional Assessment Tool Used: Clinical judgement Functional Limitation: Mobility: Walking and moving around Mobility: Walking and Moving Around Current Status [id number]): At least 40 percent but less than 60 percent impaired, limited or restricted Mobility: Walking and Moving Around Goal Status [phone]): At least 1 percent but less than 20 percent impaired, limited or restricted    Time: 1041-1108 PT Time Calculation (min): 27 min   Charges:   PT Evaluation $Initial PT Evaluation Tier I: 1 Procedure PT Treatments $Gait Training: 8-22 mins $Therapeutic Activity: 8-22 mins   PT G Codes:   Functional Assessment Tool Used: Clinical judgement Functional Limitation: Mobility: Walking and moving around    Despina Pole 04/18/2013, 3:08 PM  Carita Pian. Sanjuana Kava, [place] Pager [phone]

## 2013-04-18 NOTE — Progress Notes (Signed)
[facility] PROGRESS NOTE  TERRACE PIESKE [name] DOB: [birth year] DOA: 04/17/2013 PCP: No primary provider on file.  Assessment/Plan: . Weakness/back pain  -CT scan (of abdomen and pelvis) with multilevel spondylosis within the thoracolumbar spine>> likely etiology of his back pain -PT consulted >> recommending SNF -Continue pain management, he states the pain is controlled at this time. Transient confusion  -CT head negative  Dehydration/Acute kidney injury  Resolved with hydration, will Hep-Lock IV fluids Nausea and vomiting  -resolved, single episode, monitor  . Diabetes mellitus  -Outpatient MD discontinued diabetic medications  -Continue ADA diet and SSI  . Essential hypertension, benign -home medication resumed  Code Status: Full Family Communication: None at bedside Disposition Plan: Pending clinical course   Consultants:  None  Procedures:  None  Antibiotics:  None  HPI/Subjective: Patient admits to nonproductive cough, denies shortness of breath. Denies back pain at this time.  Objective: Filed Vitals:   04/18/13 1705  BP: 131/73  Pulse: 91  Temp: 99.1 F (37.3 C)  Resp: 18    Intake/Output Summary (Last 24 hours) at 04/18/13 1751 Last data filed at 04/18/13 1300  Gross per 24 hour  Intake   1360 ml  Output    125 ml  Net   1235 ml   Filed Weights   04/18/13 0004  Weight: 101.3 kg (223 lb 5.2 oz)    Exam:  General: alert & oriented x 2 In NAD Cardiovascular: RRR, nl S1 s2 Respiratory:coarse breath sounds, no crackles or wheezes Abdomen: soft +BS NT/ND, no masses palpable Extremities: No cyanosis and no edema     Data Reviewed: Basic Metabolic Panel:  Recent Labs Lab 04/17/13 1500 04/18/13 1030  NA 142 142  K 3.9 3.9  CL 103 104  CO2 22 24  GLUCOSE 195* 129*  BUN 23 22  CREATININE 1.40* 1.02  CALCIUM 9.0 8.7   Liver Function Tests:  Recent Labs Lab 04/17/13 1500  AST 18  ALT 9  ALKPHOS 73  BILITOT 0.4   PROT 7.0  ALBUMIN 3.2*    Recent Labs Lab 04/17/13 1500  LIPASE 22   No results found for this basename: AMMONIA,  in the last 168 hours CBC:  Recent Labs Lab 04/17/13 1500 04/18/13 1030  WBC 6.2 9.7  NEUTROABS 5.5  --   HGB 12.7* 11.0*  HCT 36.9* 32.0*  MCV 81.3 80.8  PLT 177 180   Cardiac Enzymes:  Recent Labs Lab 04/17/13 1500  TROPONINI <0.30   BNP (last 3 results) No results found for this basename: PROBNP,  in the last 8760 hours CBG:  Recent Labs Lab 04/18/13 0922 04/18/13 1135 04/18/13 1653  GLUCAP 136* 132* 164*    No results found for this or any previous visit (from the past 240 hour(s)).   Studies: Dg Chest 2 View  04/17/2013   CLINICAL DATA:  Malaise  EXAM: CHEST  2 VIEW  COMPARISON:  May 09, 2012  FINDINGS: There is stable eventration of the right hemidiaphragm. There is no edema or consolidation. Heart is upper normal in size with normal pulmonary vascularity. No adenopathy.  IMPRESSION: No edema or consolidation.   Electronically Signed   By: Lowella Grip M.D.   On: 04/17/2013 19:03   Ct Head Wo Contrast  04/18/2013   CLINICAL DATA:  Altered mental status.  EXAM: CT HEAD WITHOUT CONTRAST  TECHNIQUE: Contiguous axial images were obtained from the base of the skull through the vertex without intravenous contrast.  COMPARISON:  CT  of the head performed 05/09/2012, and MRI of the brain performed 05/10/2012  FINDINGS: There is no evidence of acute infarction, mass lesion, or intra- or extra-axial hemorrhage on CT.  Prominence of the ventricles and sulci reflects mild cortical volume loss. Diffuse periventricular and subcortical white matter change likely reflects small vessel ischemic microangiopathy. Mild cerebellar atrophy is noted. A small chronic lacunar infarct is noted at the right globus pallidus.  The brainstem and fourth ventricle are within normal limits. The cerebral hemispheres demonstrate grossly normal gray-white differentiation. No  mass effect or midline shift is seen.  There is no evidence of fracture; visualized osseous structures are unremarkable in appearance. The visualized portions of the orbits are within normal limits. The paranasal sinuses and mastoid air cells are well-aerated. No significant soft tissue abnormalities are seen.  IMPRESSION: 1. No acute intracranial pathology seen on CT. 2. Mild cortical volume loss and diffuse small vessel ischemic microangiopathy. Small chronic lacunar infarct at the right globus pallidus.   Electronically Signed   By: Garald Balding M.D.   On: 04/18/2013 01:53   Ct Abdomen Pelvis W Contrast  04/17/2013   CLINICAL DATA:  FATIGUE EMESIS DIARRHEA  EXAM: CT ABDOMEN AND PELVIS WITH CONTRAST  TECHNIQUE: Multidetector CT imaging of the abdomen and pelvis was performed using the standard protocol following bolus administration of intravenous contrast.  CONTRAST:  113mL OMNIPAQUE IOHEXOL 300 MG/ML  SOLN  COMPARISON:  None.  FINDINGS: Ill-defined pulmonary opacities scattered throughout the visualized lung bases.  The hepatic flexure is interposed between the anterior portion of the liver and the abdominal wall.  A small benign appearing 5 mm low attenuating focus centrally right lobe of the liver likely reflecting benign cysts. Liver is otherwise unremarkable. Calcified gallstone within the neck of the gallbladder.  The spleen, pancreas, kidneys are unremarkable. The adrenals demonstrate a bulbous configuration and maintained an adreniform shape.  Scattered diverticula are appreciated within the large bowel which is otherwise negative. The appendix is identified and is negative.  No abdominal or pelvic masses, free fluid, loculated fluid collections nor adenopathy appreciated.  The celiac, SMA, IMA, portal vein, SMV are opacified.  No evidence of abdominal aortic aneurysm. Mural calcifications and noncalcified mural thrombus within the aorta.  A fat containing umbilical hernia is appreciated.  There are  no aggressive appearing osseous lesions. Multilevel spondylosis within the thoracolumbar spine.  IMPRESSION: 1. Ill-defined pulmonary opacities within the lung bases differential considerations are pneumonitis. Further evaluation with dedicated PA and lateral chest radiograph recommended. 2. Chronic changes within the abdomen and pelvis. No evidence of focal or acute abnormalities appreciated. No CT evidence accounting for the patient's clinical presentation. 3. Fat containing umbilical hernia 4. Multilevel spondylosis within the thoracolumbar spine 5. Chilaiditic anatomy   Electronically Signed   By: Margaree Mackintosh M.D.   On: 04/17/2013 20:28    Scheduled Meds: . aspirin EC  81 mg Oral Daily  . docusate sodium  100 mg Oral BID  . heparin  5,000 Units Subcutaneous 3 times per day  . losartan  50 mg Oral Daily   Continuous Infusions:   Principal Problem:   Weakness Active Problems:   Diabetes mellitus   Hyperlipidemia   CVA (cerebral infarction)   Blind left eye   Essential hypertension, benign   Type II or unspecified type diabetes mellitus with neurological manifestations, not stated as uncontrolled    Time spent: [facility] Pager [phone]. If 7PM-7AM, please contact night-coverage  at [email or web address], password [facility] 04/18/2013, 5:51 PM  LOS: 1 day

## 2013-04-19 DIAGNOSIS — J189 Pneumonia, unspecified organism: Secondary | ICD-10-CM

## 2013-04-19 LAB — BASIC METABOLIC PANEL
BUN: 17 mg/dL (ref 6–23)
CO2: 24 mEq/L (ref 19–32)
CREATININE: 0.89 mg/dL (ref 0.50–1.35)
Calcium: 8.4 mg/dL (ref 8.4–10.5)
Chloride: 104 mEq/L (ref 96–112)
GFR calc Af Amer: 88 mL/min — ABNORMAL LOW (ref >90)
GFR calc non Af Amer: 76 mL/min — ABNORMAL LOW (ref >90)
Glucose, Bld: 109 mg/dL — ABNORMAL HIGH (ref 70–99)
Potassium: 3.7 mEq/L (ref 3.7–5.3)
Sodium: 140 mEq/L (ref 137–147)

## 2013-04-19 LAB — GLUCOSE, CAPILLARY
GLUCOSE-CAPILLARY: 176 mg/dL — AB (ref 70–99)
Glucose-Capillary: 113 mg/dL — ABNORMAL HIGH (ref 70–99)
Glucose-Capillary: 142 mg/dL — ABNORMAL HIGH (ref 70–99)

## 2013-04-19 MED ORDER — AZITHROMYCIN 500 MG PO TABS
500.0000 mg | ORAL_TABLET | Freq: Every day | ORAL | Status: DC
  Administered 2013-04-19 – 2013-04-20 (×2): 500 mg via ORAL
  Filled 2013-04-19 (×2): qty 1

## 2013-04-19 MED ORDER — DM-GUAIFENESIN ER 30-600 MG PO TB12
1.0000 | ORAL_TABLET | Freq: Two times a day (BID) | ORAL | Status: DC
  Administered 2013-04-19 – 2013-04-20 (×3): 1 via ORAL
  Filled 2013-04-19 (×4): qty 1

## 2013-04-19 NOTE — Progress Notes (Signed)
[facility] PROGRESS NOTE  Curtis Brock [name] DOB: [birth year] DOA: 04/17/2013 PCP: No primary provider on file.  Assessment/Plan: . Weakness/back pain  -CT scan (of abdomen and pelvis) with multilevel spondylosis within the thoracolumbar spine>> likely etiology of his back pain -PT consulted >> recommending SNF -Continue pain management, he states the pain is controlled at this time. Transient confusion  -CT head negative  Dehydration/Acute kidney injury  Resolved with hydration, will Hep-Lock IV fluids -Holding of Lasix Nausea and vomiting  -resolved, single episode, monitor  . Diabetes mellitus  -Outpatient MD discontinued diabetic medications  -Continue ADA diet and SSI  . Essential hypertension, benign -home medication resumed .? Dysphagia -Speech therapy consulted for swallow eval and dysphagia 3 with thin liquids recommended -Will follow for diet tolerance . Probable bronchitis/? Pneumonitis -Place on Mucinex DM, Zithromax and follow Code Status: Full Family Communication: Daughter and son-in-law at bedside Disposition Plan: To SNF when medically ready   Consultants:  None  Procedures:  None  Antibiotics:  None  HPI/Subjective: Patient admits to nonproductive cough, denies shortness of breath. Son-in-law at bedside and states he's been hoarse and per daughter at home appear to be short of breath sometimes  Objective: Filed Vitals:   04/19/13 0735  BP: 164/79  Pulse: 98  Temp: 98.4 F (36.9 C)  Resp: 18   No intake or output data in the 24 hours ending 04/19/13 1411 Filed Weights   04/18/13 0004  Weight: 101.3 kg (223 lb 5.2 oz)    Exam:  General: alert & oriented x 2 In NAD Cardiovascular: RRR, nl S1 s2 Respiratory:coarse breath sounds, no crackles or wheezes Abdomen: soft +BS NT/ND, no masses palpable Extremities: No cyanosis and no edema     Data Reviewed: Basic Metabolic Panel:  Recent Labs Lab 04/17/13 1500  04/18/13 1030 04/19/13 0830  NA 142 142 140  K 3.9 3.9 3.7  CL 103 104 104  CO2 22 24 24   GLUCOSE 195* 129* 109*  BUN 23 22 17   CREATININE 1.40* 1.02 0.89  CALCIUM 9.0 8.7 8.4   Liver Function Tests:  Recent Labs Lab 04/17/13 1500  AST 18  ALT 9  ALKPHOS 73  BILITOT 0.4  PROT 7.0  ALBUMIN 3.2*    Recent Labs Lab 04/17/13 1500  LIPASE 22   No results found for this basename: AMMONIA,  in the last 168 hours CBC:  Recent Labs Lab 04/17/13 1500 04/18/13 1030  WBC 6.2 9.7  NEUTROABS 5.5  --   HGB 12.7* 11.0*  HCT 36.9* 32.0*  MCV 81.3 80.8  PLT 177 180   Cardiac Enzymes:  Recent Labs Lab 04/17/13 1500  TROPONINI <0.30   BNP (last 3 results) No results found for this basename: PROBNP,  in the last 8760 hours CBG:  Recent Labs Lab 04/18/13 1135 04/18/13 1653 04/18/13 2237 04/19/13 0730 04/19/13 1234  GLUCAP 132* 164* 124* 113* 176*    No results found for this or any previous visit (from the past 240 hour(s)).   Studies: Dg Chest 2 View  04/17/2013   CLINICAL DATA:  Malaise  EXAM: CHEST  2 VIEW  COMPARISON:  May 09, 2012  FINDINGS: There is stable eventration of the right hemidiaphragm. There is no edema or consolidation. Heart is upper normal in size with normal pulmonary vascularity. No adenopathy.  IMPRESSION: No edema or consolidation.   Electronically Signed   By: Lowella Grip M.D.   On: 04/17/2013 19:03   Ct Head Wo Contrast  04/18/2013   CLINICAL DATA:  Altered mental status.  EXAM: CT HEAD WITHOUT CONTRAST  TECHNIQUE: Contiguous axial images were obtained from the base of the skull through the vertex without intravenous contrast.  COMPARISON:  CT of the head performed 05/09/2012, and MRI of the brain performed 05/10/2012  FINDINGS: There is no evidence of acute infarction, mass lesion, or intra- or extra-axial hemorrhage on CT.  Prominence of the ventricles and sulci reflects mild cortical volume loss. Diffuse periventricular and  subcortical white matter change likely reflects small vessel ischemic microangiopathy. Mild cerebellar atrophy is noted. A small chronic lacunar infarct is noted at the right globus pallidus.  The brainstem and fourth ventricle are within normal limits. The cerebral hemispheres demonstrate grossly normal gray-white differentiation. No mass effect or midline shift is seen.  There is no evidence of fracture; visualized osseous structures are unremarkable in appearance. The visualized portions of the orbits are within normal limits. The paranasal sinuses and mastoid air cells are well-aerated. No significant soft tissue abnormalities are seen.  IMPRESSION: 1. No acute intracranial pathology seen on CT. 2. Mild cortical volume loss and diffuse small vessel ischemic microangiopathy. Small chronic lacunar infarct at the right globus pallidus.   Electronically Signed   By: Garald Balding M.D.   On: 04/18/2013 01:53   Ct Abdomen Pelvis W Contrast  04/17/2013   CLINICAL DATA:  FATIGUE EMESIS DIARRHEA  EXAM: CT ABDOMEN AND PELVIS WITH CONTRAST  TECHNIQUE: Multidetector CT imaging of the abdomen and pelvis was performed using the standard protocol following bolus administration of intravenous contrast.  CONTRAST:  156mL OMNIPAQUE IOHEXOL 300 MG/ML  SOLN  COMPARISON:  None.  FINDINGS: Ill-defined pulmonary opacities scattered throughout the visualized lung bases.  The hepatic flexure is interposed between the anterior portion of the liver and the abdominal wall.  A small benign appearing 5 mm low attenuating focus centrally right lobe of the liver likely reflecting benign cysts. Liver is otherwise unremarkable. Calcified gallstone within the neck of the gallbladder.  The spleen, pancreas, kidneys are unremarkable. The adrenals demonstrate a bulbous configuration and maintained an adreniform shape.  Scattered diverticula are appreciated within the large bowel which is otherwise negative. The appendix is identified and is  negative.  No abdominal or pelvic masses, free fluid, loculated fluid collections nor adenopathy appreciated.  The celiac, SMA, IMA, portal vein, SMV are opacified.  No evidence of abdominal aortic aneurysm. Mural calcifications and noncalcified mural thrombus within the aorta.  A fat containing umbilical hernia is appreciated.  There are no aggressive appearing osseous lesions. Multilevel spondylosis within the thoracolumbar spine.  IMPRESSION: 1. Ill-defined pulmonary opacities within the lung bases differential considerations are pneumonitis. Further evaluation with dedicated PA and lateral chest radiograph recommended. 2. Chronic changes within the abdomen and pelvis. No evidence of focal or acute abnormalities appreciated. No CT evidence accounting for the patient's clinical presentation. 3. Fat containing umbilical hernia 4. Multilevel spondylosis within the thoracolumbar spine 5. Chilaiditic anatomy   Electronically Signed   By: Margaree Mackintosh M.D.   On: 04/17/2013 20:28    Scheduled Meds: . aspirin EC  81 mg Oral Daily  . azithromycin  500 mg Oral Daily  . dextromethorphan-guaiFENesin  1 tablet Oral BID  . docusate sodium  100 mg Oral BID  . heparin  5,000 Units Subcutaneous 3 times per day  . losartan  50 mg Oral Daily   Continuous Infusions:   Principal Problem:   Weakness Active Problems:   Diabetes mellitus  Hyperlipidemia   CVA (cerebral infarction)   Blind left eye   Essential hypertension, benign   Type II or unspecified type diabetes mellitus with neurological manifestations, not stated as uncontrolled    Time spent: 3    [place] [facility] Pager [phone]. If 7PM-7AM, please contact night-coverage at [email or web address], password [facility] 04/19/2013, 2:11 PM  LOS: 2 days

## 2013-04-19 NOTE — Plan of Care (Signed)
Problem: Phase I Progression Outcomes Goal: OOB as tolerated unless otherwise ordered Outcome: Progressing PT consult ordered. Goal: Initial discharge plan identified Outcome: Progressing May need to be discharged to SNF.

## 2013-04-19 NOTE — Clinical Social Work Placement (Addendum)
Clinical Social Work Department CLINICAL SOCIAL WORK PLACEMENT NOTE 04/19/2013  Patient:  Curtis Brock, Curtis Brock  Account Number:  [id number] Admit date:  04/17/2013  Clinical Social Worker:  Crawford Givens, LCSW  Date/time:  04/19/2013 01:35 AM  Clinical Social Work is seeking post-discharge placement for this patient at the following level of care:   SKILLED NURSING   (*CSW will update this form in Epic as items are completed)   04/19/2013  Patient/family provided with [place] Department of Clinical Social Work's list of facilities offering this level of care within the geographic area requested by the patient (or if unable, by the patient's family).  04/19/2013  Patient/family informed of their freedom to choose among providers that offer the needed level of care, that participate in Medicare, Medicaid or managed care program needed by the patient, have an available bed and are willing to accept the patient.    Patient/family informed of [facility]' ownership interest in [facility], as well as of the fact that they are under no obligation to receive care at this facility.  PASARR submitted to EDS on ID:[id number] PASARR number received from E[employer] on ID:[id number]  FL2 transmitted to all facilities in geographic area requested by pt/family on  04/19/2013 FL2 transmitted to all facilities within larger geographic area on   Patient informed that his/her managed care company has contracts with or will negotiate with  certain facilities, including the following:     Patient/family informed of bed offers received:   Patient chooses bed at [facility] Physician recommends and patient chooses bed at    Patient to be transferred to [facility] on 04/20/13 Patient to be transferred to facility by ambulance  The following physician request were entered in Epic:   Additional Comments:

## 2013-04-19 NOTE — Progress Notes (Signed)
Interdry applied to abdominal folds. Prophylactic sacral dressing protocol initiated. Sacral area currently without skin breakdown.  Curtis Brock, [name]

## 2013-04-19 NOTE — Clinical Social Work Psychosocial (Signed)
Clinical Social Work Department BRIEF PSYCHOSOCIAL ASSESSMENT 04/19/2013  Patient:  Curtis Brock, Curtis Brock     Account Number:  [id number]     Admit date:  04/17/2013  Clinical Social Worker:  Frederico Hamman  Date/Time:  04/19/2013 01:12 AM  Referred by:  Physician  Date Referred:  04/19/2013 Referred for  SNF Placement   Other Referral:   Interview type:  Patient Other interview type:   CSW also talked with patient's daughter, [name] who was at the bedside.    PSYCHOSOCIAL DATA Living Status:  FAMILY Admitted from facility:   Level of care:   Primary support name:  [name] Primary support relationship to patient:  CHILD, ADULT Degree of support available:   Patient lives with daughter and son-in-law Syliva Overman    CURRENT CONCERNS Current Concerns  Post-Acute Placement   Other Concerns:    SOCIAL WORK ASSESSMENT / PLAN CSW introduced self to patient and daughter and stated purpose of visit. Patient is alert, oriented and able to participate in conversation. Patient remembers that he went to rehab last year and indicated that he wants to return to that facility [facility]). Daughter more reluctant about [facility] and informed CSW that  she, patient and husband (on his way to hospital) will talk and we will get back with CSW. During conversation daughter mentioned that her husband works at [employer] as a Training and development officer, but when asked, not sure if she wants patient to go there.    Patient and daughter advised that patient ready for d/c today, and during CSW's visit MD came in and also advised that patient medically stable today.   Assessment/plan status:  Psychosocial Support/Ongoing Assessment of Needs Other assessment/ plan:   Information/referral to community resources:   Daughter given SNF list for [facility]    PATIENT'S/FAMILY'S RESPONSE TO PLAN OF CARE: Patient alert and amenable to talking with CSW. Mr. Bubier agreeable to [facility] and would like [facility] as he enjoys  the activities there, especially [place]ingo.  Daughter initially was silent and not participating in conversation, but as conversation ensued and MD came in daughter began to actively participate in talking with MD and CSW.

## 2013-04-19 NOTE — Consult Note (Signed)
WOC wound consult note Reason for Consult: Consult requested for abd skin folds.  Discussed with bedside nurse via telephone.  Skin is described to be moist with mod amt yellow drainage, odor, and partial thickness breakdown; appearance consistent  with Intertrigo.  Order placed for Interdry silver-impregnated fabric to wick drainage away from skin and provide antimicrobial benefits. This should remain in place for 5 days to provide optimal benefits.  Instructions provided to bedside nurses. Please re-consult if further assistance is needed.  Thank-you,  Julien Girt MSN, [place], [place], [place], [place]

## 2013-04-19 NOTE — Evaluation (Signed)
Occupational Therapy Evaluation Patient Details Name: Curtis Brock MRN: [id number] DOB: Dec 09, 1925 Today's Date: 04/19/2013    History of Present Illness This is a 80 y/o male who this AM had some mild confusion, he did not remember who was playing [employer] in the basketball game and at one point thought he was upstairs when he was downstairs. This apparently passed quickly. Later that day he had an episode of nausea and vomiting, no hematemesis.  He later became weak, a general weakness not localized. The patient uses a cane and walker equally at home,but he states he is usually able to get up from chair with no assistance, he climbs the stairs in the home, so this is unusual. He reports chronic back pain, which he says has been worse over the past week.  Patient admitted with weakness, dehydration, AKI, transient confusion.   Clinical Impression   Pt. Required Mod A with supine to sit EOB to perform ADLs. Pt. Has decreased abilities with LE dressing and would benefit from training with use of AE to increase I. Pt. Has decreased SHLD ROM which limits performance with upper body ADLs. Pt. Is motivated to participate with OT to maximize performance with ADLs and mobility.     Follow Up Recommendations  SNF    Equipment Recommendations  None recommended by OT    Recommendations for Other Services       Precautions / Restrictions Precautions Precautions: Fall Precaution Comments: h/o falls at home Restrictions Weight Bearing Restrictions: No      Mobility Bed Mobility               General bed mobility comments: Pt. Mod A with supine to sit and sit to supine EOB.   Transfers                      Balance                                            ADL Overall ADL's : Needs assistance/impaired Eating/Feeding: Independent   Grooming: Wash/dry hands;Wash/dry face;Set up;Minimal assistance;Brushing hair Grooming Details (indicate cue type and reason):   (Pt. Min A to comb hair secondary to decreased SHLD ROM) Upper Body Bathing: Minimal assitance;Cueing for compensatory techniques   Lower Body Bathing: Maximal assistance;Cueing for compensatory techniques   Upper Body Dressing : Moderate assistance;Cueing for compensatory techniques   Lower Body Dressing: Total assistance;Cueing for compensatory techniques               Functional mobility during ADLs:  (Pt. was Mod A with supine to sit EOB.)       Vision                     Perception     Praxis      Pertinent Vitals/Pain No c/o pain     Hand Dominance Right   Extremity/Trunk Assessment Upper Extremity Assessment Upper Extremity Assessment: RUE deficits/detail;LUE deficits/detail RUE Deficits / Details: R SHLD flex 30 and ABD 45. distal ROM is WFL. LUE Deficits / Details: L SHLD flex 30 and ABD 45 degrees. Pt. distal ROm is WFL.           Communication Communication Communication: No difficulties   Cognition Arousal/Alertness: Awake/alert           Memory: Decreased short-term memory   Safety/Judgement:  Decreased awareness of deficits;Decreased awareness of safety         General Comments       Exercises       Shoulder Instructions      Home Living Family/patient expects to be discharged to:: Skilled nursing facility Living Arrangements: Children                               Additional Comments: Son-in-law works.  Patient is home alone during day.      Prior Functioning/Environment Level of Independence: Independent with assistive device(s);Needs assistance    ADL's / Homemaking Assistance Needed: Reports he does his bathing and dressing on his own.  Son-in-law cooks meals.        OT Diagnosis: Generalized weakness   OT Problem List: Decreased strength;Decreased range of motion;Decreased activity tolerance;Decreased knowledge of use of DME or AE   OT Treatment/Interventions: Self-care/ADL  training;Therapeutic exercise;DME and/or AE instruction;Therapeutic activities    OT Goals(Current goals can be found in the care plan section) Acute Rehab OT Goals Patient Stated Goal: None stated OT Goal Formulation: With patient Time For Goal Achievement: 05/03/13 Potential to Achieve Goals: Good ADL Goals Pt Will Perform Grooming: with set-up;sitting Pt Will Perform Upper Body Bathing: with set-up;sitting Pt Will Perform Lower Body Bathing: with min assist;with adaptive equipment;sit to/from stand Pt Will Perform Upper Body Dressing: with set-up;sitting Pt Will Perform Lower Body Dressing: with min assist;sit to/from stand Pt Will Transfer to Toilet: with min assist;bedside commode  OT Frequency: Min 2X/week   Barriers to D/C: Decreased caregiver support          Co-evaluation              End of Session    Activity Tolerance: Patient tolerated treatment well Patient left: in bed;with call bell/phone within reach   Time: [id number] OT Time Calculation (min): 46 min Charges:  OT General Charges $OT Visit: 1 Procedure OT Evaluation $Initial OT Evaluation Tier I: 1 Procedure OT Treatments $Self Care/Home Management : 23-37 mins G-Codes: OT G-codes **NOT FOR INPATIENT CLASS** Functional Limitation: Self care Self Care Current Status [id number]): At least 80 percent but less than 100 percent impaired, limited or restricted Self Care Goal Status [id number]): At least 20 percent but less than 40 percent impaired, limited or restricted  Curtis Brock 04/19/2013, 12:36 PM

## 2013-04-19 NOTE — Evaluation (Signed)
Clinical/Bedside Swallow Evaluation Patient Details  Name: Curtis Brock MRN: [id number] Date of Birth: 1925-04-24  Today's Date: 04/19/2013 Time: [id number] SLP Time Calculation (min): 28 min  Past Medical History:  Past Medical History  Diagnosis Date  . Diabetes mellitus without complication   . Hypertension    Past Surgical History: History reviewed. No pertinent past surgical history. HPI:  Pt is an 80 y/o male admitted with weakness, dehydration, AKI, transient confusion. He had a previous MBS in June 2014 revealing a mild dysphagia due primarily to a structural component, with recommendations for Dys 3/thin liquids.   Assessment / Plan / Recommendation Clinical Impression  Pt presents with piecemeal swallows with large sips of thin liquids as well as multiple swallows, which appear consistent with previous MBS June 2014, which revealed a mild dysphagia due in large part to structural component. Pt with immediate cough x1 following mixed consistency bolus containing whole pill and large straw sip. With cues for small sips and slow rate, pt did not exhibit any further overt s/s of aspiration with additional PO and pill intake. Recommend Dys 3 and thin liquids. SLP to follow briefly for tolerance.    Aspiration Risk  Mild    Diet Recommendation Dysphagia 3 (Mechanical Soft);Thin liquid   Liquid Administration via: Cup;Straw Medication Administration: Whole meds with liquid Supervision: Patient able to self feed;Intermittent supervision to cue for compensatory strategies Compensations: Slow rate;Small sips/bites;Multiple dry swallows after each bite/sip Postural Changes and/or Swallow Maneuvers: Seated upright 90 degrees;Upright 30-60 min after meal    Other  Recommendations Oral Care Recommendations: Oral care BID   Follow Up Recommendations  None    Frequency and Duration min 2x/week  1 week   Pertinent Vitals/Pain N/A    SLP Swallow Goals     Swallow Study Prior  Functional Status       General Date of Onset:  (chronic) HPI: Pt is an 80 y/o male admitted with weakness, dehydration, AKI, transient confusion. He had a previous MBS in June 2014 revealing a mild dysphagia due primarily to a structural component, with recommendations for Dys 3/thin liquids. Type of Study: Bedside swallow evaluation Previous Swallow Assessment: MBS 06/17/12 recommending Dys 3/thin liquids Diet Prior to this Study: Regular;Thin liquids Temperature Spikes Noted: Yes (low grade) Respiratory Status: Room air History of Recent Intubation: No Behavior/Cognition: Alert;Cooperative;Pleasant mood;Requires cueing Oral Cavity - Dentition: Edentulous Self-Feeding Abilities: Able to feed self Patient Positioning: Upright in bed Baseline Vocal Quality: Clear Volitional Cough: Strong Volitional Swallow: Able to elicit    Oral/Motor/Sensory Function Overall Oral Motor/Sensory Function: Appears within functional limits for tasks assessed   Ice Chips Ice chips: Not tested   Thin Liquid Thin Liquid: Impaired Presentation: Self Fed;Straw Oral Phase Functional Implications: Oral holding;Other (comment) (piecemeal swallowing with larger sips) Pharyngeal  Phase Impairments: Multiple swallows;Cough - Immediate Other Comments: immediate cough x1 following mixed consistency bolus (pill consumed with thin liquid via straw)    Nectar Thick Nectar Thick Liquid: Not tested   Honey Thick Honey Thick Liquid: Not tested   Puree Puree: Impaired Presentation: Self Fed;Spoon Pharyngeal Phase Impairments: Multiple swallows   Solid   GO Functional Assessment Tool Used: skilled clinical judgment Functional Limitations: Swallowing Swallow Current Status [id number]): At least 1 percent but less than 20 percent impaired, limited or restricted Swallow Goal Status [phone]): At least 1 percent but less than 20 percent impaired, limited or restricted  Solid: Impaired Presentation: Self Fed Pharyngeal Phase  Impairments: Multiple swallows  Germain Osgood, M.A. CCC-SLP [phone]  Germain Osgood 04/19/2013,4:13 PM

## 2013-04-20 ENCOUNTER — Ambulatory Visit: Payer: Medicare Other | Admitting: Emergency Medicine

## 2013-04-20 DIAGNOSIS — N179 Acute kidney failure, unspecified: Secondary | ICD-10-CM

## 2013-04-20 DIAGNOSIS — R5383 Other fatigue: Principal | ICD-10-CM

## 2013-04-20 DIAGNOSIS — R5381 Other malaise: Secondary | ICD-10-CM

## 2013-04-20 LAB — GLUCOSE, CAPILLARY
GLUCOSE-CAPILLARY: 115 mg/dL — AB (ref 70–99)
Glucose-Capillary: 153 mg/dL — ABNORMAL HIGH (ref 70–99)

## 2013-04-20 MED ORDER — DM-GUAIFENESIN ER 30-600 MG PO TB12: 1.0000 | ORAL_TABLET | Freq: Two times a day (BID) | ORAL | Status: DC

## 2013-04-20 MED ORDER — DSS 100 MG PO CAPS: 100.0000 mg | ORAL_CAPSULE | Freq: Two times a day (BID) | ORAL | Status: DC

## 2013-04-20 MED ORDER — AZITHROMYCIN 500 MG PO TABS: 500.0000 mg | ORAL_TABLET | Freq: Every day | ORAL | Status: DC

## 2013-04-20 MED ORDER — OXYCODONE HCL 5 MG PO TABS: 5.0000 mg | ORAL_TABLET | Freq: Four times a day (QID) | ORAL | Status: DC | PRN

## 2013-04-20 MED ORDER — ACETAMINOPHEN 325 MG PO TABS: 650.0000 mg | ORAL_TABLET | Freq: Four times a day (QID) | ORAL | Status: DC | PRN

## 2013-04-20 NOTE — Discharge Summary (Signed)
Physician Discharge Summary  TRINI WILEMAN [name] DOB: 11-25-1925 DOA: 04/17/2013  PCP: No primary provider on file.  Admit date: 04/17/2013 Discharge date: 04/20/2013  Time spent: >30 minutes  Recommendations for Outpatient Follow-up:  Follow-up Information   Please follow up. (SNF MD in 1-2days)      Followup labs -Bmet recommended in 3 days-followup renal function.  Discharge Diagnoses:  Principal Problem:   Weakness Active Problems:   Diabetes mellitus   Hyperlipidemia   CVA (cerebral infarction)   Blind left eye   Essential hypertension, benign   Type II or unspecified type diabetes mellitus with neurological manifestations, not stated as uncontrolled  bronchitis/? Pneumonitis  Discharge Condition:   Diet recommendation: Dysphagia 3 diet, modified carbohydrate.  Filed Weights   04/18/13 0004 04/19/13 2101  Weight: 101.3 kg (223 lb 5.2 oz) 98.204 kg (216 lb 8 oz)    History of present illness:  This is a 80 y/o male who this AM had some mild confusion, he did not remember who was playing [employer] in the basketball game and at one point thought he was upstairs when he was downstairs. This apparently passed quickly. Later that day he had an episode of nausea and vomiting, no hematemesis. He later became weak, a general weakness not localized. The patient uses a cane and walker equally at home,but he states he is usually able to get up from chair with no assistance, he climbs the stairs in the home, so this is unusual. He reports chronic back pain, which he says has been worse over the past week. He states he usually takes a little brown pill that takes the pain away but he has not been taking it recently and doesn't remember the name,neither does his daughter. He denies any incontinence urinary or bladder.  Here i the ER the patient appeared very dry he was given 2L NS, attempt was made to ambulate patient, he remains weak. He was admitted for further evaluation and  management. Hospital Course:   . Weakness/back pain  -Upon admission the patient had CT scan (of abdomen and pelvis) with multilevel spondylosis within the thoracolumbar spine>> likely etiology of his back pain  -He was placed on Tylenol on oral narcotic when necessary and his pain was well controlled. -PT consulted >> recommending SNF for rehabilitation Transient confusion  -Improved back to his baseline mentation, CT head negative  Dehydration/Acute kidney injury  Resolved with hydration, and IV fluids at discontinued -Lasix was held in the hospital. He is on low dose of Lasix>> discussed with family and they state his by mouth intake is always good at home. Patient to resume Lasix on discharge and followup with nursing home M.D. for close monitoring of renal function and adjustment of meds/Lasix as clinically appropriate Nausea and vomiting  -resolved, single episode, monitor  . Diabetes mellitus  -Outpatient MD discontinued diabetic medications  -His Accu-Cheks were monitored and he was cut with sliding scale insulin in the hospital. He is to follow up with nursing home M.D. for further monitoring of his sugars and management as appropriate.  . Essential hypertension, benign -home medication resumed  .mild  Dysphagia  -Speech therapy consulted for swallow eval and dysphagia 3 with thin liquids recommended  -He is tolerating his dysphagia 3 diet and is to continue this on discharge. . Probable bronchitis/? Pneumonitis  -Place on Mucinex DM, Zithromax and follow   Procedures:  none  Consultations:  none  Discharge Exam: Filed Vitals:   04/20/13 0449  BP:  142/76  Pulse: 92  Temp: 98.8 F (37.1 C)  Resp: 18   Exam:  General: alert & oriented x 2 In NAD  Cardiovascular: RRR, nl S1 s2  Respiratory:coarse breath sounds, no crackles or wheezes  Abdomen: soft +BS NT/ND, no masses palpable  Extremities: No cyanosis and no edema     Discharge Instructions You were cared  for by a hospitalist during your hospital stay. If you have any questions about your discharge medications or the care you received while you were in the hospital after you are discharged, you can call the unit and asked to speak with the hospitalist on call if the hospitalist that took care of you is not available. Once you are discharged, your primary care physician will handle any further medical issues. Please note that NO REFILLS for any discharge medications will be authorized once you are discharged, as it is imperative that you return to your primary care physician (or establish a relationship with a primary care physician if you do not have one) for your aftercare needs so that they can reassess your need for medications and monitor your lab values.  Discharge Orders   Future Orders Complete By Expires   Diet - low sodium heart healthy  As directed    Increase activity slowly  As directed        Medication List         aspirin EC 81 MG tablet  Take 81 mg by mouth daily.     azithromycin 500 MG tablet  Commonly known as:  ZITHROMAX  Take 1 tablet (500 mg total) by mouth daily.     cyanocobalamin 1000 MCG/ML injection  Commonly known as:  (VITAMIN B-12)  INJECT 1 ML AS DIRECTED EVERY MONTH     dextromethorphan-guaiFENesin 30-600 MG per 12 hr tablet  Commonly known as:  MUCINEX DM  Take 1 tablet by mouth 2 (two) times daily.     DSS 100 MG Caps  Take 100 mg by mouth 2 (two) times daily.     furosemide 20 MG tablet  Commonly known as:  LASIX  Take 20 mg by mouth daily.     losartan 50 MG tablet  Commonly known as:  COZAAR  Take 1 tablet (50 mg total) by mouth daily.     oxyCODONE 5 MG immediate release tablet  Commonly known as:  Oxy IR/ROXICODONE  Take 1 tablet (5 mg total) by mouth every 6 (six) hours as needed for moderate pain.       Tylenol 650 mg q. 6 hours when necessary pain No Known Allergies     Follow-up Information   Please follow up. (SNF MD in  1-2days)        The results of significant diagnostics from this hospitalization (including imaging, microbiology, ancillary and laboratory) are listed below for reference.    Significant Diagnostic Studies: Dg Chest 2 View  04/17/2013   CLINICAL DATA:  Malaise  EXAM: CHEST  2 VIEW  COMPARISON:  May 09, 2012  FINDINGS: There is stable eventration of the right hemidiaphragm. There is no edema or consolidation. Heart is upper normal in size with normal pulmonary vascularity. No adenopathy.  IMPRESSION: No edema or consolidation.   Electronically Signed   By: Lowella Grip M.D.   On: 04/17/2013 19:03   Ct Head Wo Contrast  04/18/2013   CLINICAL DATA:  Altered mental status.  EXAM: CT HEAD WITHOUT CONTRAST  TECHNIQUE: Contiguous axial images were obtained from the base  of the skull through the vertex without intravenous contrast.  COMPARISON:  CT of the head performed 05/09/2012, and MRI of the brain performed 05/10/2012  FINDINGS: There is no evidence of acute infarction, mass lesion, or intra- or extra-axial hemorrhage on CT.  Prominence of the ventricles and sulci reflects mild cortical volume loss. Diffuse periventricular and subcortical white matter change likely reflects small vessel ischemic microangiopathy. Mild cerebellar atrophy is noted. A small chronic lacunar infarct is noted at the right globus pallidus.  The brainstem and fourth ventricle are within normal limits. The cerebral hemispheres demonstrate grossly normal gray-white differentiation. No mass effect or midline shift is seen.  There is no evidence of fracture; visualized osseous structures are unremarkable in appearance. The visualized portions of the orbits are within normal limits. The paranasal sinuses and mastoid air cells are well-aerated. No significant soft tissue abnormalities are seen.  IMPRESSION: 1. No acute intracranial pathology seen on CT. 2. Mild cortical volume loss and diffuse small vessel ischemic microangiopathy.  Small chronic lacunar infarct at the right globus pallidus.   Electronically Signed   By: Garald Balding M.D.   On: 04/18/2013 01:53   Ct Abdomen Pelvis W Contrast  04/17/2013   CLINICAL DATA:  FATIGUE EMESIS DIARRHEA  EXAM: CT ABDOMEN AND PELVIS WITH CONTRAST  TECHNIQUE: Multidetector CT imaging of the abdomen and pelvis was performed using the standard protocol following bolus administration of intravenous contrast.  CONTRAST:  148mL OMNIPAQUE IOHEXOL 300 MG/ML  SOLN  COMPARISON:  None.  FINDINGS: Ill-defined pulmonary opacities scattered throughout the visualized lung bases.  The hepatic flexure is interposed between the anterior portion of the liver and the abdominal wall.  A small benign appearing 5 mm low attenuating focus centrally right lobe of the liver likely reflecting benign cysts. Liver is otherwise unremarkable. Calcified gallstone within the neck of the gallbladder.  The spleen, pancreas, kidneys are unremarkable. The adrenals demonstrate a bulbous configuration and maintained an adreniform shape.  Scattered diverticula are appreciated within the large bowel which is otherwise negative. The appendix is identified and is negative.  No abdominal or pelvic masses, free fluid, loculated fluid collections nor adenopathy appreciated.  The celiac, SMA, IMA, portal vein, SMV are opacified.  No evidence of abdominal aortic aneurysm. Mural calcifications and noncalcified mural thrombus within the aorta.  A fat containing umbilical hernia is appreciated.  There are no aggressive appearing osseous lesions. Multilevel spondylosis within the thoracolumbar spine.  IMPRESSION: 1. Ill-defined pulmonary opacities within the lung bases differential considerations are pneumonitis. Further evaluation with dedicated PA and lateral chest radiograph recommended. 2. Chronic changes within the abdomen and pelvis. No evidence of focal or acute abnormalities appreciated. No CT evidence accounting for the patient's clinical  presentation. 3. Fat containing umbilical hernia 4. Multilevel spondylosis within the thoracolumbar spine 5. Chilaiditic anatomy   Electronically Signed   By: Margaree Mackintosh M.D.   On: 04/17/2013 20:28    Microbiology: No results found for this or any previous visit (from the past 240 hour(s)).   Labs: Basic Metabolic Panel:  Recent Labs Lab 04/17/13 1500 04/18/13 1030 04/19/13 0830  NA 142 142 140  K 3.9 3.9 3.7  CL 103 104 104  CO2 22 24 24   GLUCOSE 195* 129* 109*  BUN 23 22 17   CREATININE 1.40* 1.02 0.89  CALCIUM 9.0 8.7 8.4   Liver Function Tests:  Recent Labs Lab 04/17/13 1500  AST 18  ALT 9  ALKPHOS 73  BILITOT 0.4  PROT 7.0  ALBUMIN 3.2*    Recent Labs Lab 04/17/13 1500  LIPASE 22   No results found for this basename: AMMONIA,  in the last 168 hours CBC:  Recent Labs Lab 04/17/13 1500 04/18/13 1030  WBC 6.2 9.7  NEUTROABS 5.5  --   HGB 12.7* 11.0*  HCT 36.9* 32.0*  MCV 81.3 80.8  PLT 177 180   Cardiac Enzymes:  Recent Labs Lab 04/17/13 1500  TROPONINI <0.30   BNP: BNP (last 3 results) No results found for this basename: PROBNP,  in the last 8760 hours CBG:  Recent Labs Lab 04/18/13 2237 04/19/13 0730 04/19/13 1234 04/19/13 2058 04/20/13 0726  GLUCAP 124* 113* 176* 142* 115*       Signed:  Markeshia Giebel C  [facility] 04/20/2013, 10:35 AM

## 2013-04-20 NOTE — Progress Notes (Signed)
Called and gave report to staff of [facility] CN). IV removed without issue. Condom catheter removed. Pt will transfer to facility via ambulance service.

## 2013-04-20 NOTE — Progress Notes (Signed)
Physical Therapy Treatment Patient Details Name: RAEKWAN KAUER MRN: [id number] DOB: 12/25/1925 Today's Date: 04/20/2013    History of Present Illness Patient is an 80 y/o male admitted with confusion, AKI, dehydration with N&V and weakness.    PT Comments    Patient much improved with ambulation tolerance and balance over last session.  Still need good amount of assist to rise and extra time and assist for safety with ambulation.  Agree with SNF level rehab prior to d/c home.  Reported some leg soreness with ambulation, but states back pain resolved.  Follow Up Recommendations  SNF;Supervision/Assistance - 24 hour     Equipment Recommendations  None recommended by PT    Recommendations for Other Services       Precautions / Restrictions Precautions Precautions: Fall Precaution Comments: h/o falls at home    Mobility  Bed Mobility Overal bed mobility: Needs Assistance Bed Mobility: Rolling;Sidelying to Sit Rolling: Min guard Sidelying to sit: Mod assist       General bed mobility comments: cues to scoot to make room to roll and sit up through sidelying with assist to lift trunk and scoot to edge of bed  Transfers   Equipment used: Rolling walker (2 wheeled) Transfers: Sit to/from Stand Sit to Stand: Mod assist         General transfer comment: cues for hand placement, lifting assist to stand  Ambulation/Gait Ambulation/Gait assistance: Min assist Ambulation Distance (Feet): 70 Feet (x 2 with seated rest) Assistive device: Rolling walker (2 wheeled) Gait Pattern/deviations: Step-through pattern;Shuffle;Trunk flexed;Decreased stride length     General Gait Details: able to demonstrate anterior weight shift, cues for proper use of walker and posture and step length   Stairs            Wheelchair Mobility    Modified Rankin (Stroke Patients Only)       Balance Overall balance assessment: Needs assistance         Standing balance support:  Bilateral upper extremity supported Standing balance-Leahy Scale: Poor Standing balance comment: assist for balance with UE support                    Cognition Arousal/Alertness: Awake/alert Behavior During Therapy: WFL for tasks assessed/performed Overall Cognitive Status: Impaired/Different from baseline                 General Comments: transient confusion; limited safety awareness despite cues    Exercises General Exercises - Lower Extremity Ankle Circles/Pumps: AROM;10 reps;Supine Heel Slides: AAROM;Both;10 reps;Supine    General Comments        Pertinent Vitals/Pain Min c/o soreness in LE's    Home Living                      Prior Function            PT Goals (current goals can now be found in the care plan section) Progress towards PT goals: Progressing toward goals    Frequency  Min 3X/week    PT Plan Current plan remains appropriate    Co-evaluation             End of Session Equipment Utilized During Treatment: Gait belt Activity Tolerance: Patient tolerated treatment well Patient left: in chair;with call bell/phone within reach     Time: [id number] PT Time Calculation (min): 34 min  Charges:  $Gait Training: 23-37 mins  G Codes:      WYNN,CYNDI 04/20/2013, 12:39 PM Magda Kiel, [place] 04/20/2013

## 2013-04-21 ENCOUNTER — Other Ambulatory Visit: Payer: Self-pay | Admitting: *Deleted

## 2013-04-21 MED ORDER — OXYCODONE HCL 5 MG PO TABS: 5.0000 mg | ORAL_TABLET | Freq: Four times a day (QID) | ORAL | Status: DC | PRN

## 2013-04-21 NOTE — Telephone Encounter (Signed)
[facility] 

## 2013-05-04 ENCOUNTER — Encounter: Payer: Self-pay | Admitting: Adult Health

## 2013-05-04 ENCOUNTER — Non-Acute Institutional Stay (SKILLED_NURSING_FACILITY): Payer: Medicare Other | Admitting: Adult Health

## 2013-05-04 DIAGNOSIS — M7730 Calcaneal spur, unspecified foot: Secondary | ICD-10-CM | POA: Insufficient documentation

## 2013-05-04 NOTE — Progress Notes (Signed)
Patient ID: Curtis Brock, male   DOB: 01-Apr-1925, 80 y.o.   MRN: [id number]     [place]  No Known Allergies   Chief Complaint  Patient presents with  . Acute Visit    heel pain    HPI:  He is complaining of bilateral heel pain which he has had for the past two weeks. He states the pain is severe and he cannot tolerate anyone touching his heels. His current pain medication is not effective in treating his pain.    Past Medical History  Diagnosis Date  . Diabetes mellitus without complication   . Hypertension     No past surgical history on file.  VITAL SIGNS BP 125/70  Pulse 76  Ht 5\' 6"  (1.676 m)  Wt 223 lb (101.152 kg)  BMI 36.01 kg/m2   Patient's Medications  New Prescriptions   No medications on file  Previous Medications   ACETAMINOPHEN (TYLENOL) 325 MG TABLET    Take 2 tablets (650 mg total) by mouth every 6 (six) hours as needed for mild pain or moderate pain (or Fever >/= 101).   ASPIRIN EC 81 MG TABLET    Take 81 mg by mouth daily.   CYANOCOBALAMIN (,VITAMIN B-12,) 1000 MCG/ML INJECTION    INJECT 1 ML AS DIRECTED EVERY MONTH   DOCUSATE SODIUM 100 MG CAPS    Take 100 mg by mouth 2 (two) times daily.   FUROSEMIDE (LASIX) 20 MG TABLET    Take 20 mg by mouth daily.   LOSARTAN (COZAAR) 50 MG TABLET    Take 1 tablet (50 mg total) by mouth daily.   OXYCODONE (OXY IR/ROXICODONE) 5 MG IMMEDIATE RELEASE TABLET    Take 1 tablet (5 mg total) by mouth every 6 (six) hours as needed for moderate pain.  Modified Medications   No medications on file  Discontinued Medications   AZITHROMYCIN (ZITHROMAX) 500 MG TABLET    Take 1 tablet (500 mg total) by mouth daily.   DEXTROMETHORPHAN-GUAIFENESIN (MUCINEX DM) 30-600 MG PER 12 HR TABLET    Take 1 tablet by mouth 2 (two) times daily.    SIGNIFICANT DIAGNOSTIC EXAMS   LABS REVIEWED:   04-17-13; glucose 195; bun 23; creat 1.4; k+3.9; na++142    Review of Systems  Constitutional: Negative for malaise/fatigue.    Respiratory: Negative for cough and shortness of breath.   Cardiovascular: Negative for chest pain and palpitations.  Gastrointestinal: Negative for abdominal pain.  Musculoskeletal: Positive for joint pain. Negative for myalgias.       Has bilateral heel pain  Skin: Negative.   Psychiatric/Behavioral: Negative for depression. The patient is not nervous/anxious.      Physical Exam  Constitutional: No distress.  Obese   Neck: Neck supple. No JVD present.  Cardiovascular: Normal rate, regular rhythm and intact distal pulses.   Respiratory: Effort normal and breath sounds normal. No respiratory distress.  GI: Soft. Bowel sounds are normal. He exhibits no distension. There is no tenderness.  Musculoskeletal: Normal range of motion. He exhibits no edema.  Has significant tenderness to bilateral heels to palpation.   Neurological: He is alert.  Skin: Skin is warm and dry. He is not diaphoretic.       ASSESSMENT/ PLAN:  1. Heel spurs; will being aleve twice daily for 2 weeks; will have therapy assess and treat as indicated for pain relief and will monitor

## 2013-05-07 ENCOUNTER — Non-Acute Institutional Stay (SKILLED_NURSING_FACILITY): Payer: Medicare Other | Admitting: Internal Medicine

## 2013-05-07 DIAGNOSIS — N179 Acute kidney failure, unspecified: Secondary | ICD-10-CM

## 2013-05-07 DIAGNOSIS — E1149 Type 2 diabetes mellitus with other diabetic neurological complication: Secondary | ICD-10-CM

## 2013-05-11 NOTE — Progress Notes (Signed)
Patient ID: Curtis Brock, male   DOB: Jun 12, 1925, 80 y.o.   MRN: [id number]                  PROGRESS NOTE  DATE:  05/07/2013    FACILITY: [facility]    LEVEL OF CARE:   SNF   Acute Visit/Discharge Visit     HISTORY OF PRESENT ILLNESS:  This is an 80 year-old man who was brought in with some altered mental status.  Subsequently, he developed nausea and vomiting, became weak and deconditioned.  He normally uses a cane and a walker at home, but he was not able to get up without assistance.  In the ER, he appeared dehydrated and he was given 2 L of normal saline.     A CT scan of the abdomen and pelvis in the hospital showed multi-level spinal stenosis within the thoracolumbar spine.  He was recommended for physical therapy here.    His transient confusion resolved.  He received IV fluid and then was put back on Lasix.    He is a listed diabetic although I do not see that he had any CBGs done, nor does he appear to be on any medications for diabetes.   Apparently, his diabetes medications were discontinued before his arrival in the hospital.    LABORATORY DATA:  Lab work from the hospitalization is reviewed.    He arrived with a BUN of 23, a creatinine of 1.4.  He was discharged with a BUN of 17 and a creatinine of 0.89.  His albumin was low at 3.2.    Troponin-I was less than 0.30.    His CBC looked normal, although his admission hemoglobin was 12.7 and discharge of 11.  This may have been partially hydrational.   He did have an elevated left shift with 88% neutrophils.  I do not see any relevant cultures.    REVIEW OF SYSTEMS:   CHEST/RESPIRATORY:  He is not coughing or short of breath.   CARDIAC:   He denies chest pain.     PHYSICAL EXAMINATION:   CHEST/RESPIRATORY:  Reasonably clear air entry bilaterally.   CARDIOVASCULAR:  CARDIAC:   Heart sounds are normal.  JVP is not elevated.  There are no overt signs of heart failure.   EDEMA/VARICOSITIES:  There is minimal edema.       ASSESSMENT/PLAN:  Acute renal failure on presentation to hospital.  He is on Lasix 20 mg and Losartan 50 mg.  His BMP will need to be followed.    He was given azithromycin here for five days, although I think this was a hospital order and I have no idea what they were treating.    Type 2 diabetes.  We were supposed to be checking his CBGs although, since we have not seen him, these have not been ordered.  Looking at hospital CBGs shows one as high as 195.    The patient appears to be stable for discharge.  He was supposed to only be here for 20 days.  He will need lab work prior to discharge, a basic metabolic panel, and I will screen his CBGs.    CPT CODE: 91478

## 2013-06-02 ENCOUNTER — Other Ambulatory Visit: Payer: Self-pay | Admitting: Physician Assistant

## 2013-06-21 ENCOUNTER — Telehealth: Payer: Self-pay

## 2013-06-21 ENCOUNTER — Telehealth: Payer: Self-pay | Admitting: Radiology

## 2013-06-21 DIAGNOSIS — R269 Unspecified abnormalities of gait and mobility: Secondary | ICD-10-CM

## 2013-06-21 NOTE — Telephone Encounter (Signed)
Patient requesting Rx for Rolling walker with seat, pended order please sign, when you sign in the computer, the order will print, I can let son know he can pick up the order, please let me know when the order is ready, so I can call son (in law) Syliva Overman [phone]

## 2013-06-21 NOTE — Telephone Encounter (Signed)
pts son in law brought a picture of a roller seat (walker) to 104. Mr Curtis Brock states his father in law needs an rx for insurance to cover this through [facility].   Pt sees Dr Everlene Farrier.  Due to Dr Caren Griffins appointment schedule, i placed this information at Amy L's desk at 104.   Please call Mr Curtis Brock when ready. Picture of exact roller seat is on the paper at Amy's computer.   Bf

## 2013-07-01 ENCOUNTER — Ambulatory Visit (INDEPENDENT_AMBULATORY_CARE_PROVIDER_SITE_OTHER): Payer: Medicare Other | Admitting: Emergency Medicine

## 2013-07-01 ENCOUNTER — Telehealth: Payer: Self-pay | Admitting: Radiology

## 2013-07-01 VITALS — BP 99/60 | HR 107 | Temp 98.4°F | Resp 16 | Wt 213.6 lb

## 2013-07-01 DIAGNOSIS — H547 Unspecified visual loss: Secondary | ICD-10-CM

## 2013-07-01 DIAGNOSIS — IMO0001 Reserved for inherently not codable concepts without codable children: Secondary | ICD-10-CM

## 2013-07-01 DIAGNOSIS — E1165 Type 2 diabetes mellitus with hyperglycemia: Principal | ICD-10-CM

## 2013-07-01 DIAGNOSIS — H543 Unqualified visual loss, both eyes: Secondary | ICD-10-CM

## 2013-07-01 DIAGNOSIS — R269 Unspecified abnormalities of gait and mobility: Secondary | ICD-10-CM

## 2013-07-01 DIAGNOSIS — I10 Essential (primary) hypertension: Secondary | ICD-10-CM

## 2013-07-01 DIAGNOSIS — Z23 Encounter for immunization: Secondary | ICD-10-CM

## 2013-07-01 LAB — COMPLETE METABOLIC PANEL WITH GFR
ALT: 9 U/L (ref 0–53)
AST: 12 U/L (ref 0–37)
Albumin: 3.7 g/dL (ref 3.5–5.2)
Alkaline Phosphatase: 59 U/L (ref 39–117)
BUN: 17 mg/dL (ref 6–23)
CALCIUM: 8.9 mg/dL (ref 8.4–10.5)
CO2: 25 meq/L (ref 19–32)
CREATININE: 1.16 mg/dL (ref 0.50–1.35)
Chloride: 103 mEq/L (ref 96–112)
GFR, EST AFRICAN AMERICAN: 66 mL/min
GFR, EST NON AFRICAN AMERICAN: 57 mL/min — AB
GLUCOSE: 136 mg/dL — AB (ref 70–99)
Potassium: 4.3 mEq/L (ref 3.5–5.3)
Sodium: 137 mEq/L (ref 135–145)
Total Bilirubin: 0.7 mg/dL (ref 0.2–1.2)
Total Protein: 6.5 g/dL (ref 6.0–8.3)

## 2013-07-01 LAB — POCT GLYCOSYLATED HEMOGLOBIN (HGB A1C): Hemoglobin A1C: 6.3

## 2013-07-01 LAB — VITAMIN B12: VITAMIN B 12: 706 pg/mL (ref 211–911)

## 2013-07-01 LAB — GLUCOSE, POCT (MANUAL RESULT ENTRY): POC Glucose: 143 mg/dL — AB (ref 70–99)

## 2013-07-01 MED ORDER — ZOSTER VACCINE LIVE 19400 UNT/0.65ML ~~LOC~~ SOLR: 0.6500 mL | Freq: Once | SUBCUTANEOUS | Status: DC

## 2013-07-01 NOTE — Progress Notes (Signed)
Subjective:   This chart was scribed for Darlyne Russian, MD by Forrestine Him, [facility] Scribe. This patient was seen in room 28 and the patient's care was started 10:10 AM.    Patient ID: Curtis Brock, male    DOB: August 24, 1925, 80 y.o.   MRN: [id number]  Chief Complaint  Patient presents with  . Follow-up    6 month check up    HPI  HPI Comments: Curtis Brock is a 80 y.o. male with a PMHx of DM, blindness to L eye, neuropathy, CVA, and hyperlipidemia who presents to [facility] for 6 month follow up today. Daughter in law states he has been sleeping often daily and reports recent decrease in ambulation. States sugars are checked regularly. However, daughter in law is unaware of recent sugars as her husband typically keeps record of readings. Pt is no longer followed by physical therapy as Medicare will no longer cover costs. Daughter states when he was seen frequently by PT, he was much more active and moving. At this time, no fever, chill, rash, or cough.   Pt is requesting 90 day supply of Lasix and Losartan.   Patient Active Problem List   Diagnosis Date Noted  . Heel spur 05/04/2013  . Weakness 04/17/2013  . Anemia of other chronic disease 06/10/2012  . Essential hypertension, benign 06/03/2012  . Type II or unspecified type diabetes mellitus with neurological manifestations, not stated as uncontrolled 06/03/2012  . Abnormality of gait 05/28/2012  . Type I (juvenile type) diabetes mellitus with peripheral circulatory disorders, not stated as uncontrolled(250.71) 05/28/2012  . Unspecified hereditary and idiopathic peripheral neuropathy 05/28/2012  . Unspecified late effects of cerebrovascular disease 05/28/2012  . Weakness generalized 05/11/2012  . Blind left eye 12/11/2011  . Neuropathy 12/11/2011  . Elevated LFTs 11/12/2011  . Diabetes mellitus 03/26/2011  . Hyperlipidemia 03/26/2011  . CVA (cerebral infarction) 03/26/2011  . Gait  disorder 03/26/2011   Past Medical History  Diagnosis Date  . Diabetes mellitus without complication   . Hypertension    No past surgical history on file. No Known Allergies Prior to Admission medications   Medication Sig Start Date End Date Taking? Authorizing Provider  aspirin EC 81 MG tablet Take 81 mg by mouth daily.   Yes Historical Provider, MD  cyanocobalamin (,VITAMIN B-12,) 1000 MCG/ML injection INJECT 1 ML AS DIRECTED EVERY MONTH 04/05/13  Yes Darlyne Russian, MD  furosemide (LASIX) 20 MG tablet Take 20 mg by mouth daily.   Yes Historical Provider, MD  furosemide (LASIX) 20 MG tablet TAKE 1 TABLET (20 MG TOTAL) BY MOUTH DAILY. 06/02/13  Yes Darlyne Russian, MD  losartan (COZAAR) 50 MG tablet Take 1 tablet (50 mg total) by mouth daily. 06/17/12  Yes Darlyne Russian, MD  acetaminophen (TYLENOL) 325 MG tablet Take 2 tablets (650 mg total) by mouth every 6 (six) hours as needed for mild pain or moderate pain (or Fever >/= 101). 04/20/13   Sheila Oats, MD  docusate sodium 100 MG CAPS Take 100 mg by mouth 2 (two) times daily. 04/20/13   Sheila Oats, MD  oxyCODONE (OXY IR/ROXICODONE) 5 MG immediate release tablet Take 1 tablet (5 mg total) by mouth every 6 (six) hours as needed for moderate pain. 04/21/13   Pricilla Larsson, NP   History   Social History  . Marital Status: Widowed    Spouse Name: N/A    Number of  Children: N/A  . Years of Education: N/A   Occupational History  . Not on file.   Social History Main Topics  . Smoking status: Never Smoker   . Smokeless tobacco: Not on file  . Alcohol Use: No  . Drug Use: No  . Sexual Activity: No   Other Topics Concern  . Not on file   Social History Narrative  . No narrative on file     Review of Systems  Constitutional: Positive for activity change and fatigue. Negative for fever and chills.  HENT: Negative for congestion.   Eyes: Negative for redness.  Respiratory: Negative for cough.   Skin: Negative for rash.    Psychiatric/Behavioral: Negative for confusion.     Objective:  Physical Exam  CONSTITUTIONAL: Well developed/well nourished HEAD: Normocephalic/atraumatic EYES: Patient is blind in his left eye appeared ENMT: Mucous membranes moist NECK: supple no meningeal signs SPINE:entire spine nontender CV: S1/S2 noted, no murmurs/rubs/gallops noted LUNGS: Lungs are clear to auscultation bilaterally, no apparent distress ABDOMEN: soft, nontender, no rebound or guarding GU:no cva tenderness NEURO: Pt is awake/alert, moves all extremitiesx4 EXTREMITIES: pulses normal, full ROM he has 1+ edema with significant peripheral neuropathy SKIN: warm, color normal PSYCH: no abnormalities of mood noted  Results for orders placed in visit on 07/01/13  GLUCOSE, POCT (MANUAL RESULT ENTRY)      Result Value Ref Range   POC Glucose 143 (*) 70 - 99 mg/dl  POCT GLYCOSYLATED HEMOGLOBIN (HGB A1C)      Result Value Ref Range   Hemoglobin A1C 6.3      Triage Vitals: BP 99/60  Pulse 107  Temp(Src) 98.4 F (36.9 C) (Oral)  Resp 16  Wt 213 lb 9.6 oz (96.888 kg)  SpO2 97%   Assessment & Plan:  Diabetes under good control with A1c of 6.3. No change in medication. We'll try and get him a walker with a seat so he can get out with more exercise. Apparently he is sleeping all day the I personally performed the services described in this documentation, which was scribed in my presence. The recorded information has been reviewed and is accurate.

## 2013-07-01 NOTE — Telephone Encounter (Signed)
Patient needs rolling walker with seat/ order in epic, patient also has original signed order, he has gait disturbance.

## 2013-07-03 ENCOUNTER — Other Ambulatory Visit: Payer: Self-pay | Admitting: Emergency Medicine

## 2013-07-06 ENCOUNTER — Ambulatory Visit: Payer: Medicare Other | Admitting: Emergency Medicine

## 2013-07-13 ENCOUNTER — Ambulatory Visit: Payer: Medicare Other | Admitting: Emergency Medicine

## 2013-07-26 ENCOUNTER — Telehealth: Payer: Self-pay | Admitting: *Deleted

## 2013-07-26 DIAGNOSIS — R269 Unspecified abnormalities of gait and mobility: Secondary | ICD-10-CM

## 2013-07-26 DIAGNOSIS — E1159 Type 2 diabetes mellitus with other circulatory complications: Secondary | ICD-10-CM

## 2013-07-26 DIAGNOSIS — G589 Mononeuropathy, unspecified: Secondary | ICD-10-CM

## 2013-07-26 DIAGNOSIS — R5383 Other fatigue: Principal | ICD-10-CM

## 2013-07-26 DIAGNOSIS — R5381 Other malaise: Secondary | ICD-10-CM

## 2013-07-26 NOTE — Telephone Encounter (Signed)
Pt is at the foot care center now to get his diabetic shoes. Pt daughter states she brought in paperwork that needed to be completed so he would be able to pick these up over a month ago. Checked Dr Everlene Farrier box and did not find. Pt needs order faxed to [phone] with the diagnosis code.   Printed DME prescription. Georgiann Mccoy to sign for Dr. Everlene Farrier.

## 2013-07-31 ENCOUNTER — Other Ambulatory Visit: Payer: Self-pay | Admitting: Emergency Medicine

## 2013-08-05 ENCOUNTER — Other Ambulatory Visit: Payer: Self-pay | Admitting: Physician Assistant

## 2013-08-09 NOTE — Telephone Encounter (Signed)
Please add to the note. Patient does have callus over both feet with decreased sensation to both feet. He is at extreme risk to develop neuropathic ulcers. He needs to be in diabetic shoes

## 2013-08-09 NOTE — Telephone Encounter (Signed)
Please see my documentation.

## 2013-08-09 NOTE — Telephone Encounter (Signed)
Awaiting signature.

## 2013-08-09 NOTE — Telephone Encounter (Signed)
Forms signed. Form faxed to number provided.

## 2013-08-09 NOTE — Telephone Encounter (Signed)
Patients daughter has brought in another copy of the paperwork, but I do not see a foot exam, do you recall if he has any problems with his feet, or if you checked, will need addendum to the last office visit if you did. Please advise, forms placed in your box.

## 2013-08-09 NOTE — Telephone Encounter (Signed)
Spoke to [place] pts daughter, she is aware and will come by tomorrow to p/u forms.  She is aware they will be waitin up front for her.

## 2013-08-17 ENCOUNTER — Telehealth: Payer: Self-pay | Admitting: Radiology

## 2013-08-17 NOTE — Telephone Encounter (Signed)
error 

## 2013-08-30 ENCOUNTER — Telehealth: Payer: Self-pay

## 2013-08-30 ENCOUNTER — Other Ambulatory Visit: Payer: Self-pay | Admitting: Radiology

## 2013-08-30 DIAGNOSIS — R269 Unspecified abnormalities of gait and mobility: Secondary | ICD-10-CM

## 2013-08-30 NOTE — Telephone Encounter (Signed)
Daughter phoned stating the [facility] needs the medical records for patient.  An order was signed for patient to receive diabetic shoes.  Date of Service is July 01, 2013.   Fax  [phone] Phone  [phone]  Daughter  [phone]

## 2013-08-31 NOTE — Telephone Encounter (Signed)
Records faxed thru Epic.

## 2013-09-01 NOTE — Telephone Encounter (Signed)
Printed order- faxed to [place] and pt daughter advised.

## 2013-09-01 NOTE — Telephone Encounter (Signed)
A male called (did not state name) asking for a call back. Did not leave any details. Cb# [phone]

## 2013-09-01 NOTE — Telephone Encounter (Signed)
Daughter is requesting an order for diabetic shoes for her dad at [facility].  Does he need an appointment with Dr. Everlene Farrier?  Or can Dr. Everlene Farrier write an order to [facility] for diabetic shoes?   [place] isn't sure why they want all this information, since he already had diabetic shoes in the past.      Fax [phone]  Phone [phone]  Daughter [phone]

## 2013-09-23 ENCOUNTER — Ambulatory Visit: Payer: Medicare Other | Admitting: Emergency Medicine

## 2013-10-05 ENCOUNTER — Ambulatory Visit (INDEPENDENT_AMBULATORY_CARE_PROVIDER_SITE_OTHER): Payer: Medicare Other | Admitting: Emergency Medicine

## 2013-10-05 ENCOUNTER — Encounter: Payer: Self-pay | Admitting: Emergency Medicine

## 2013-10-05 VITALS — BP 100/60 | HR 101 | Temp 97.6°F | Resp 16 | Ht 63.0 in | Wt 220.0 lb

## 2013-10-05 DIAGNOSIS — I639 Cerebral infarction, unspecified: Secondary | ICD-10-CM

## 2013-10-05 DIAGNOSIS — I739 Peripheral vascular disease, unspecified: Secondary | ICD-10-CM

## 2013-10-05 DIAGNOSIS — E119 Type 2 diabetes mellitus without complications: Secondary | ICD-10-CM

## 2013-10-05 DIAGNOSIS — I6350 Cerebral infarction due to unspecified occlusion or stenosis of unspecified cerebral artery: Secondary | ICD-10-CM

## 2013-10-05 DIAGNOSIS — Z23 Encounter for immunization: Secondary | ICD-10-CM

## 2013-10-05 LAB — GLUCOSE, POCT (MANUAL RESULT ENTRY): POC Glucose: 140 mg/dl — AB (ref 70–99)

## 2013-10-05 LAB — POCT GLYCOSYLATED HEMOGLOBIN (HGB A1C): Hemoglobin A1C: 6.6

## 2013-10-05 NOTE — Progress Notes (Addendum)
   Subjective:    Patient ID: NORI KUNSELMAN, male    DOB: December 14, 1925, 80 y.o.   MRN: [id number] This chart was scribed for Darlyne Russian, MD by Zola Button, ED Scribe. This patient was seen in room Room/bed 21 and the patient's care was started at 1:22 PM.   HPI HPI Comments: SERGIO BEAVER is a 80 y.o. male with a hx of DM who presents to the [facility] for a follow up. Patient has fallen twice and normally uses a walker at home. He was last in physical therapy 4 months ago and has finished physical therapy. Patient has had some weakness, but is otherwise healthy. Per relative, he sees his optometrist once a year. Patient does want to be resuscitated. He denies CP, abdominal pain and any other pain. He denies exercising. Patient's tetanus is UTD, but he does require a flu shot. Per relative, patient has gained some weight. Patient denies having ever smoked.  Review of Systems  Constitutional: Positive for unexpected weight change (weight gain).  Cardiovascular: Negative for chest pain.  Gastrointestinal: Negative for abdominal pain.  Neurological: Positive for weakness.       Objective:   Physical Exam  CONSTITUTIONAL: Well developed/well nourished. HEAD: Normocephalic/atraumatic EYES: EOM/PERRL, blind in left eye ENMT: Mucous membranes moist NECK: supple no meningeal signs SPINE: entire spine nontender CV: S1/S2 noted, no murmurs/rubs/gallops noted.  LUNGS: Lungs are clear to auscultation bilaterally, no apparent distress ABDOMEN: soft, nontender, no rebound or guarding GU: no cva tenderness NEURO: Pt is awake/alert, moves all extremitiesx4, decreased sensation, walks with a slow  wide-based gait, he shuffles his feet, he seems to have increased rigidity of the upper and lower extremities EXTREMITIES: full ROM, decreased pulses in lower extremities with 1+ edema. SKIN: warm, color normal PSYCH: no abnormalities of mood noted  HB AIC 6.8     Assessment & Plan:    We'll schedule arterial studies of both legs referral made for physical therapy to work on gait and strengthening core muscles. Recheck 4 months. He was given a prescription for shingles vaccine to get in about 2 weeks flu shot given today he is a hardy been given Prevnar this summer.

## 2013-10-07 ENCOUNTER — Ambulatory Visit (HOSPITAL_COMMUNITY)
Admission: RE | Admit: 2013-10-07 | Discharge: 2013-10-07 | Disposition: A | Discharge status: Home / Self Care | Payer: Medicare Other | Source: Ambulatory Visit | Attending: Emergency Medicine | Admitting: Emergency Medicine

## 2013-10-07 DIAGNOSIS — I739 Peripheral vascular disease, unspecified: Secondary | ICD-10-CM | POA: Diagnosis not present

## 2013-10-07 NOTE — Progress Notes (Signed)
VASCULAR LAB PRELIMINARY  ARTERIAL  ABI completed:    RIGHT    LEFT    PRESSURE WAVEFORM  PRESSURE WAVEFORM  BRACHIAL 124 Triphasic BRACHIAL 121 Triphasic  DP 113 Monophasic DP 65 Monophasic  PT 150 Triphasic PT 96 Triphasic    RIGHT LEFT  ABI 1.21 0.77   ABIs indicate normal arterial flow on the right and a mild to moderate reduction in  arterial flow on the left.  Alexander Aument, [place], RVS 10/07/2013, 6:34 PM

## 2013-11-02 ENCOUNTER — Ambulatory Visit: Payer: Medicare Other | Attending: Emergency Medicine

## 2013-11-02 DIAGNOSIS — R262 Difficulty in walking, not elsewhere classified: Secondary | ICD-10-CM | POA: Insufficient documentation

## 2013-11-02 DIAGNOSIS — R5381 Other malaise: Secondary | ICD-10-CM | POA: Diagnosis not present

## 2013-11-02 DIAGNOSIS — M6281 Muscle weakness (generalized): Secondary | ICD-10-CM | POA: Diagnosis not present

## 2013-11-02 DIAGNOSIS — I639 Cerebral infarction, unspecified: Secondary | ICD-10-CM | POA: Insufficient documentation

## 2013-11-08 ENCOUNTER — Ambulatory Visit: Payer: Medicare Other

## 2013-11-08 DIAGNOSIS — I639 Cerebral infarction, unspecified: Secondary | ICD-10-CM | POA: Diagnosis not present

## 2013-11-12 ENCOUNTER — Ambulatory Visit: Payer: Medicare Other

## 2013-11-12 DIAGNOSIS — I639 Cerebral infarction, unspecified: Secondary | ICD-10-CM | POA: Diagnosis not present

## 2013-11-14 ENCOUNTER — Other Ambulatory Visit: Payer: Self-pay | Admitting: Emergency Medicine

## 2013-11-15 ENCOUNTER — Ambulatory Visit: Payer: Medicare Other | Attending: Emergency Medicine

## 2013-11-15 DIAGNOSIS — R262 Difficulty in walking, not elsewhere classified: Secondary | ICD-10-CM | POA: Diagnosis not present

## 2013-11-15 DIAGNOSIS — R531 Weakness: Secondary | ICD-10-CM

## 2013-11-15 DIAGNOSIS — R5381 Other malaise: Secondary | ICD-10-CM | POA: Diagnosis not present

## 2013-11-15 DIAGNOSIS — I639 Cerebral infarction, unspecified: Secondary | ICD-10-CM | POA: Insufficient documentation

## 2013-11-15 DIAGNOSIS — M6281 Muscle weakness (generalized): Secondary | ICD-10-CM | POA: Diagnosis not present

## 2013-11-15 NOTE — Therapy (Signed)
Physical Therapy Treatment  Patient Details  Name: Curtis Brock MRN: [id number] Date of Birth: 1925/02/04  Encounter Date: 11/15/2013      PT End of Session - 11/15/13 1155    Visit Number 4   Number of Visits 16   Authorization Type [employer] Medicare   Authorization Time Period 11/02/13-12/31/13   PT Start Time 0813   PT Stop Time 0900   PT Time Calculation (min) 47 min   Equipment Utilized During Treatment Gait belt   Activity Tolerance Patient limited by fatigue      Past Medical History  Diagnosis Date  . Diabetes mellitus without complication   . Hypertension     History reviewed. No pertinent past surgical history.  There were no vitals taken for this visit.  Visit Diagnosis:  Difficult walk-unspec  Weakness generalized  Debility          Adult PT Treatment/Exercise - 11/15/13 0700    Transfers Sit to Stand;Stand to Sit  2x5, with cues to improve eccentric control   Sit to Stand 5: Supervision;Without upper extremity assist   Sit to Stand Details (indicate cue type and reason) verbal cues to get nose over toes for anterior weight shift    Stand to Sit 5: Supervision;Without upper extremity assist   Stand to Sit Details verbal cues for reaching back to find surface for safety   Ambulation/Gait Yes   Ambulation/Gait Assistance 4: Min guard   Ambulation Distance (Feet) 96 Feet  2x50'   Assistive device 4-wheeled walker   Gait Pattern Shuffle;Right foot flat;Left foot flat;Decreased stride length  verbal cues to increase step length, heel strike at [place] --   Number of Stairs --   Height of Stairs --   Balance Assessed Yes   Dynamic Standing - Balance Support No upper extremity supported   Dynamic Standing - Level of Assistance 5: Stand by assistance;4: Min assist   Dynamic Standing - Balance Activities Eyes open;Lateral lean/weight shifting  2" and 4" step taps with toes and dot taps with B heels.2x10   Dynamic Standing -  Comments Balance counter activites with 1 UE and CGA-min A during 1 LOB episode: forward marches, backward amb., lateral sidestepping 6x7'. CGA-min A during all toe/heel steps: 4 LOB episodes. VCs and tactile cues to improve lateral weight shifting. Pt required seated rest breaks after each set due to fatigue.            PT Short Term Goals - 11/15/13 0844    Title Independent in performing HEP to improve safety and balance at home  11/30/13   Time 2   Period Weeks   Status On-going   Title Increase BERG balance score to decrease falls risk.   Time 2   Period Weeks   Status On-going   Title perform TUG in < 60 seconds, with LRAD, to decrease falls risk.   Time 2   Period Weeks   Status On-going   Title increase gait speed to >/=2.62ft/sec. to decrease falls risk.   Time 2   Period Weeks   Status On-going   Title ambulate 150' over even/uneven terrain, with LRAD and supervision, to decrease falls risk.   Time 2   Period Weeks   Status On-going          PT Long Term Goals - 11/15/13 1221    Title verbalize understanding of: fall prevention strategies within home environment.  12/28/13   Time 6  Period Weeks   Status On-going   Title improve BERG score to 38/56 to decrease risk of falls.   Time 6   Period Weeks   Status On-going   Title perform TUG in < 50 sec., with LRAD, to decrease falls risk.   Time 6   Period Weeks   Status On-going   Title ambulate 300' over even/uneven terrain, with LRAD and supervision, to decrease falls risk.   Time 6   Period Weeks   Status On-going   Additional Long Term Goals Yes   Title will increase FOTO functional status by 10 points.   Time 6   Period Weeks   Status On-going          Plan - 11/15/13 1157    Clinical Impression Statement Pt demonstrated progress towards goals, as he was able to perform dynamic gait and balance activities. Pt continues to be limited by decreased L LE weakness and endurance, as he continues to  experience decreased L heel strike and requires seated rest breaks during session. Pt would continue to benefit from skilled therapy to improve safety during fucntional mobility.   Pt will benefit from skilled therapeutic intervention in order to improve on the following deficits Decreased activity tolerance;Difficulty walking;Decreased strength   Rehab Potential Good   PT Frequency Min 2X/week   PT Duration 8 weeks   PT Treatment/Interventions DME instruction;Gait training;Stair training;Functional mobility training;Therapeutic activities;Therapeutic exercise;Manual techniques;Patient/family education;Neuromuscular re-education;Balance training;Modalities   PT Plan Progress dynamic gait with and without rollator, dynamic balance activiites.        Problem List Patient Active Problem List   Diagnosis Date Noted  . Heel spur 05/04/2013  . Weakness 04/17/2013  . Anemia of other chronic disease 06/10/2012  . Essential hypertension, benign 06/03/2012  . Type II or unspecified type diabetes mellitus with neurological manifestations, not stated as uncontrolled 06/03/2012  . Abnormality of gait 05/28/2012  . Type I (juvenile type) diabetes mellitus with peripheral circulatory disorders, not stated as uncontrolled(250.71) 05/28/2012  . Unspecified hereditary and idiopathic peripheral neuropathy 05/28/2012  . Unspecified late effects of cerebrovascular disease 05/28/2012  . Weakness generalized 05/11/2012  . Blind left eye 12/11/2011  . Neuropathy 12/11/2011  . Elevated LFTs 11/12/2011  . Diabetes mellitus 03/26/2011  . Hyperlipidemia 03/26/2011  . CVA (cerebral infarction) 03/26/2011  . Gait disorder 03/26/2011                                            Lynsay Fesperman L 11/15/2013, 12:28 PM

## 2013-11-15 NOTE — Therapy (Signed)
Physical Therapy Evaluation  Patient Details  Name: Curtis Brock MRN: [id number] Date of Birth: 03-21-1925  Encounter Date: 11/15/2013      PT End of Session - 11/15/13 1155    Visit Number 4   Number of Visits 16   Authorization Type [employer] Medicare   Authorization Time Period 11/02/13-12/31/13   PT Start Time 0813   PT Stop Time 0900   PT Time Calculation (min) 47 min   Equipment Utilized During Treatment Gait belt   Activity Tolerance Patient limited by fatigue      Past Medical History  Diagnosis Date  . Diabetes mellitus without complication   . Hypertension     History reviewed. No pertinent past surgical history.  There were no vitals taken for this visit.  Visit Diagnosis:  Difficult walk-unspec  Weakness generalized  Debility          Adult PT Treatment/Exercise - 11/15/13 0700    Transfers Sit to Stand;Stand to Sit  2x5, with cues to improve eccentric control   Sit to Stand 5: Supervision;Without upper extremity assist   Sit to Stand Details (indicate cue type and reason) verbal cues to get nose over toes for anterior weight shift    Stand to Sit 5: Supervision;Without upper extremity assist   Stand to Sit Details verbal cues for reaching back to find surface for safety   Ambulation/Gait Yes   Ambulation/Gait Assistance 4: Min guard   Ambulation Distance (Feet) 96 Feet  2x50'   Assistive device 4-wheeled walker   Gait Pattern Shuffle;Right foot flat;Left foot flat;Decreased stride length  verbal cues to increase step length, heel strike at [place] --   Number of Stairs --   Height of Stairs --   Balance Assessed Yes   Dynamic Standing - Balance Support No upper extremity supported   Dynamic Standing - Level of Assistance 5: Stand by assistance;4: Min assist   Dynamic Standing - Balance Activities Eyes open;Lateral lean/weight shifting  2" and 4" step taps with toes and dot taps with B heels.2x10   Dynamic Standing -  Comments Balance counter activites with 1 UE and CGA-min A during 1 LOB episode: forward marches, backward amb., lateral sidestepping 6x7'. CGA-min A during all toe/heel steps: 4 LOB episodes. VCs and tactile cues to improve lateral weight shifting. Pt required seated rest breaks after each set due to fatigue.            PT Short Term Goals - 11/15/13 0844    Title Independent in performing HEP to improve safety and balance at home  11/30/13   Time 2   Period Weeks   Status On-going   Title Increase BERG balance score to decrease falls risk.   Time 2   Period Weeks   Status On-going   Title perform TUG in < 60 seconds, with LRAD, to decrease falls risk.   Time 2   Period Weeks   Status On-going   Title increase gait speed to >/=2.83ft/sec. to decrease falls risk.   Time 2   Period Weeks   Status On-going   Title ambulate 150' over even/uneven terrain, with LRAD and supervision, to decrease falls risk.   Time 2   Period Weeks   Status On-going          PT Long Term Goals - 11/15/13 1221    Title verbalize understanding of: fall prevention strategies within home environment.  12/28/13   Time 6  Period Weeks   Status On-going   Title improve BERG score to 38/56 to decrease risk of falls.   Time 6   Period Weeks   Status On-going   Title perform TUG in < 50 sec., with LRAD, to decrease falls risk.   Time 6   Period Weeks   Status On-going   Title ambulate 300' over even/uneven terrain, with LRAD and supervision, to decrease falls risk.   Time 6   Period Weeks   Status On-going   Additional Long Term Goals Yes   Title will increase FOTO functional status by 10 points.   Time 6   Period Weeks   Status On-going          Plan - 11/15/13 1157    Clinical Impression Statement Pt demonstrated progress towards goals, as he was able to perform dynamic gait and balance activities. Pt continues to be limited by L LE weakness and endurance, as he continues to  experience decreased L heel strike and requires seated rest breaks during session. Pt would continue to benefit from skilled therapy to improve safety during fucntional mobility.   Pt will benefit from skilled therapeutic intervention in order to improve on the following deficits Decreased activity tolerance;Difficulty walking;Decreased strength   Rehab Potential Good   PT Frequency Min 2X/week   PT Duration 8 weeks   PT Treatment/Interventions DME instruction;Gait training;Stair training;Functional mobility training;Therapeutic activities;Therapeutic exercise;Manual techniques;Patient/family education;Neuromuscular re-education;Balance training;Modalities   PT Plan Progress dynamic gait with and without rollator, dynamic balance activiites.        Problem List Patient Active Problem List   Diagnosis Date Noted  . Heel spur 05/04/2013  . Weakness 04/17/2013  . Anemia of other chronic disease 06/10/2012  . Essential hypertension, benign 06/03/2012  . Type II or unspecified type diabetes mellitus with neurological manifestations, not stated as uncontrolled 06/03/2012  . Abnormality of gait 05/28/2012  . Type I (juvenile type) diabetes mellitus with peripheral circulatory disorders, not stated as uncontrolled(250.71) 05/28/2012  . Unspecified hereditary and idiopathic peripheral neuropathy 05/28/2012  . Unspecified late effects of cerebrovascular disease 05/28/2012  . Weakness generalized 05/11/2012  . Blind left eye 12/11/2011  . Neuropathy 12/11/2011  . Elevated LFTs 11/12/2011  . Diabetes mellitus 03/26/2011  . Hyperlipidemia 03/26/2011  . CVA (cerebral infarction) 03/26/2011  . Gait disorder 03/26/2011                                            Miller,Jennifer L 11/15/2013, 12:33 PM

## 2013-11-22 ENCOUNTER — Ambulatory Visit: Payer: Medicare Other

## 2013-11-22 DIAGNOSIS — R5381 Other malaise: Secondary | ICD-10-CM

## 2013-11-22 DIAGNOSIS — R262 Difficulty in walking, not elsewhere classified: Secondary | ICD-10-CM

## 2013-11-22 DIAGNOSIS — I639 Cerebral infarction, unspecified: Secondary | ICD-10-CM | POA: Diagnosis not present

## 2013-11-22 DIAGNOSIS — R531 Weakness: Secondary | ICD-10-CM

## 2013-11-22 NOTE — Therapy (Signed)
Physical Therapy Treatment  Patient Details  Name: Curtis Brock MRN: [id number] Date of Birth: 1925-12-01  Encounter Date: 11/22/2013      PT End of Session - 11/22/13 0855    Visit Number 5   Number of Visits 16   PT Start Time 0800   PT Stop Time [id number]   PT Time Calculation (min) 44 min   Equipment Utilized During Treatment Gait belt   Activity Tolerance Patient tolerated treatment well      Past Medical History  Diagnosis Date  . Diabetes mellitus without complication   . Hypertension     History reviewed. No pertinent past surgical history.  There were no vitals taken for this visit.  Visit Diagnosis:  Difficult walk-unspec  Weakness generalized  Debility          [facility] Adult PT Treatment/Exercise - 11/22/13 0807    Transfers   Transfers Sit to Stand;Stand to Sit  x5, VC's for sequencing during first 2 reps.   Sit to Stand 5: Supervision;With upper extremity assist;From chair/3-in-1   Sit to Stand Details (indicate cue type and reason) VCs for sequencing (hand placement and locking/unlocking brakes).   Stand to Sit 5: Supervision;Without upper extremity assist   Stand to Sit Details VC's for sequencing.   Ambulation/Gait   Ambulation/Gait Yes  frequent seated rest breaks 2/2 fatigue.   Ambulation/Gait Assistance 4: Min guard  with and without head turns.   Ambulation Distance (Feet) --  66' with RW; 230',20x2, 125' with rollator    Assistive device 4-wheeled walker;Rolling walker   Gait Pattern Shuffle;Right foot flat;Left foot flat;Decreased stride length;Step-through pattern;Trunk flexed  verbal cues to increase step length, heel strike at IC   Stairs No   Balance   Balance Assessed Yes   Dynamic Standing Balance   Dynamic Standing - Balance Support Bilateral upper extremity supported   Dynamic Standing - Level of Assistance 4: Min assist  CGA-min A   Dynamic Standing - Balance Activities Eyes open;Forward lean/weight shifting  Forward steps with  and without 0.5" foam beam 2x20 B LE.   Dynamic Standing - Comments Forward weight shifting with and without beam, min A tactile cues to improve weight shift and B knee ext.  one seated rest break 2/2 fatigue.                Plan - 11/22/13 0855    Clinical Impression Statement Pt demonstrated progress, as he was able to tolerate ambulating longer distances without rest breaks. Pt continues to experience impaired balance and decr. gait speed during dynamic gait activites and required frequent VC's to improve heel strike (L>R) and upright posture. Pt would continue to benefit from skilled therapy to improve safety during functional mobility.   Pt will benefit from skilled therapeutic intervention in order to improve on the following deficits Abnormal gait;Decreased balance;Decreased knowledge of use of DME;Decreased mobility;Decreased strength;Decreased endurance;Impaired flexibility   Rehab Potential Good   PT Frequency 2x / week   PT Duration 8 weeks   PT Treatment/Interventions ADLs/Self Care Home Management;Gait training;Neuromuscular re-education;Stair training;Functional mobility training;Patient/family education;Therapeutic activities;Therapeutic exercise;DME Instruction;Balance training   Consulted and Agree with Plan of Care Patient;Family member/caregiver  discussed treatment with pt's daughter at end of session.   PT Plan Dynamic gait training and balance activities (weight shifting and improving B knee extension).        Problem List Patient Active Problem List   Diagnosis Date Noted  . Heel spur 05/04/2013  . Weakness 04/17/2013  .  Anemia of other chronic disease 06/10/2012  . Essential hypertension, benign 06/03/2012  . Type II or unspecified type diabetes mellitus with neurological manifestations, not stated as uncontrolled 06/03/2012  . Abnormality of gait 05/28/2012  . Type I (juvenile type) diabetes mellitus with peripheral circulatory disorders, not stated as  uncontrolled(250.71) 05/28/2012  . Unspecified hereditary and idiopathic peripheral neuropathy 05/28/2012  . Unspecified late effects of cerebrovascular disease 05/28/2012  . Weakness generalized 05/11/2012  . Blind left eye 12/11/2011  . Neuropathy 12/11/2011  . Elevated LFTs 11/12/2011  . Diabetes mellitus 03/26/2011  . Hyperlipidemia 03/26/2011  . CVA (cerebral infarction) 03/26/2011  . Gait disorder 03/26/2011                                            Miller,Jennifer L 11/22/2013, 9:05 AM

## 2013-11-25 ENCOUNTER — Ambulatory Visit: Payer: Medicare Other | Admitting: Physical Therapy

## 2013-11-25 ENCOUNTER — Encounter: Payer: Self-pay | Admitting: Physical Therapy

## 2013-11-25 DIAGNOSIS — R262 Difficulty in walking, not elsewhere classified: Secondary | ICD-10-CM

## 2013-11-25 DIAGNOSIS — R5381 Other malaise: Secondary | ICD-10-CM

## 2013-11-25 DIAGNOSIS — I639 Cerebral infarction, unspecified: Secondary | ICD-10-CM | POA: Diagnosis not present

## 2013-11-25 DIAGNOSIS — R531 Weakness: Secondary | ICD-10-CM

## 2013-11-25 NOTE — Therapy (Signed)
Physical Therapy Treatment  Patient Details  Name: Curtis Brock MRN: [id number] Date of Birth: December 30, 1925  Encounter Date: 11/25/2013      PT End of Session - 11/25/13 1154    Visit Number 6   Number of Visits 16   Date for PT Re-Evaluation --  12/28/13   Authorization Type [employer] Medicare   Authorization Time Period 11/02/13-12/31/13   Authorization - Visit Number --  No visit limit   PT Start Time 0934   PT Stop Time 1016   PT Time Calculation (min) 42 min   Activity Tolerance Patient tolerated treatment well      Past Medical History  Diagnosis Date  . Diabetes mellitus without complication   . Hypertension     History reviewed. No pertinent past surgical history.  There were no vitals taken for this visit.  Visit Diagnosis:  Difficult walk-unspec  Weakness generalized  Debility      Subjective Assessment - 11/25/13 0937    Symptoms Denies fall or pain.   Currently in Pain? No/denies           [facility] Adult PT Treatment/Exercise - 11/25/13 0957    Transfers   Transfers Sit to Stand;Stand to Sit   Sit to Stand 5: Supervision;With upper extremity assist   Sit to Stand Details (indicate cue type and reason) min cues for forward weight shift   Stand to Sit 5: Supervision;With upper extremity assist   Ambulation/Gait   Ambulation/Gait Yes   Ambulation/Gait Assistance 4: Min guard   Ambulation Distance (Feet) 100 Feet  times 2   Assistive device Rollator   Gait Pattern Step-to pattern;Decreased step length - right;Decreased step length - left;Decreased dorsiflexion - right;Decreased dorsiflexion - left;Shuffle   Gait velocity decreased   Dynamic Standing Balance   Dynamic Standing - Balance Support Bilateral upper extremity supported;Right upper extremity supported;No upper extremity supported  started with 2 then 1 then none   Dynamic Standing - Level of Assistance 4: Min assist   Dynamic Standing - Balance Activities Other (comment)  forward/backward  rock weight shift, step over/back ruler   Dynamic Standing - Comments in parallel bars-needs seated rest breaks at times   High Level Balance   High Level Balance Activities Other (comment)  taps to 4" step   High Level Balance Comments initially 2 UE then 1 UE then no UE assist   Knee/Hip Exercises: Aerobic   Stationary Bike x 5 minutes all 4 extremities level 1.0 on [employer]   Knee/Hip Exercises: Standing   Other Standing Knee Exercises Standing in parallel bars for bil hip SLR, hip extension, hip abduction, heel raises, toes raises, marching-all x 10   Transfers   Sit to Stand Details Other (comment)  x 10 reps from mat           PT Short Term Goals - 11/24/13 1330    PT SHORT TERM GOAL #1   Title Independent in performing HEP to improve safety and balance at home   Baseline --  11/30/13   Status On-going   PT SHORT TERM GOAL #2   Title Increase BERG balance score to decrease falls risk.   Time --  11/30/13   Status On-going   PT SHORT TERM GOAL #3   Title perform TUG in < 60 seconds, with LRAD, to decrease falls risk.   Time --  11/30/13   Status On-going   PT SHORT TERM GOAL #4   Title increase gait speed to >/=2.68ft/sec. to decrease  falls risk.   Time --  11/30/13   Status On-going   PT SHORT TERM GOAL #5   Title ambulate 150' over even/uneven terrain, with LRAD and supervision, to decrease falls risk.   Time --  11/30/13   Status On-going          PT Long Term Goals - 11/24/13 1330    PT LONG TERM GOAL #1   Title verbalize understanding of: fall prevention strategies within home environment.   Time --  12/28/13   Status On-going   PT LONG TERM GOAL #2   Title improve BERG score to 38/56 to decrease risk of falls.   Time --  12/28/13   Status On-going   PT LONG TERM GOAL #3   Title perform TUG in < 50 sec., with LRAD, to decrease falls risk.   Time --  12/28/13   Status On-going   PT LONG TERM GOAL #4   Title Pt will increase gait speed to  >/=2.58ft/sec. to be a safe Hydrograph[employer]or.   Time --  12/28/13   Status On-going   PT LONG TERM GOAL #5   Title ambulate 300' over even/uneven terrain, with LRAD and supervision, to decrease falls risk.   Time --  12/28/13   Status On-going   PT LONG TERM GOAL #6   Title will increase FOTO functional status by 10 points.   Time --  12/28/13   Status On-going         Plan - 11/25/13 1156    Clinical Impression Statement Pt continues to need cues for heel strike and foot clearance.   Pt will benefit from skilled therapeutic intervention in order to improve on the following deficits Abnormal gait;Decreased balance;Decreased knowledge of use of DME;Decreased mobility;Decreased strength;Decreased endurance;Impaired flexibility   Rehab Potential Good   PT Frequency 2x / week   PT Duration 8 weeks   PT Treatment/Interventions ADLs/Self Care Home Management;Gait training;Neuromuscular re-education;Stair training;Functional mobility training;Patient/family education;Therapeutic activities;Therapeutic exercise;DME Instruction;Balance training   PT Next Visit Plan Continue balance activities and strengthening   Consulted and Agree with Plan of Care Patient     Problem List Patient Active Problem List   Diagnosis Date Noted  . Heel spur 05/04/2013  . Weakness 04/17/2013  . Anemia of other chronic disease 06/10/2012  . Essential hypertension, benign 06/03/2012  . Type II or unspecified type diabetes mellitus with neurological manifestations, not stated as uncontrolled 06/03/2012  . Abnormality of gait 05/28/2012  . Type I (juvenile type) diabetes mellitus with peripheral circulatory disorders, not stated as uncontrolled(250.71) 05/28/2012  . Unspecified hereditary and idiopathic peripheral neuropathy 05/28/2012  . Unspecified late effects of cerebrovascular disease 05/28/2012  . Weakness generalized 05/11/2012  . Blind left eye 12/11/2011  . Neuropathy 12/11/2011  . Elevated LFTs  11/12/2011  . Diabetes mellitus 03/26/2011  . Hyperlipidemia 03/26/2011  . CVA (cerebral infarction) 03/26/2011  . Gait disorder 03/26/2011     [name] 11/25/2013, 12:04 PM

## 2013-11-30 ENCOUNTER — Ambulatory Visit: Payer: Medicare Other | Admitting: Physical Therapy

## 2013-11-30 ENCOUNTER — Encounter: Payer: Self-pay | Admitting: Physical Therapy

## 2013-11-30 DIAGNOSIS — R531 Weakness: Secondary | ICD-10-CM

## 2013-11-30 DIAGNOSIS — R262 Difficulty in walking, not elsewhere classified: Secondary | ICD-10-CM

## 2013-11-30 DIAGNOSIS — I639 Cerebral infarction, unspecified: Secondary | ICD-10-CM | POA: Diagnosis not present

## 2013-11-30 NOTE — Therapy (Signed)
Physical Therapy Treatment  Patient Details  Name: Curtis Brock MRN: [id number] Date of Birth: April 30, 1925  Encounter Date: 11/30/2013      PT End of Session - 11/30/13 1219    Visit Number 7   Number of Visits 16   Date for PT Re-Evaluation --  12/28/13   Authorization Type UHC Medicare   Authorization Time Period 11/02/13-12/31/13   PT Start Time 0927   PT Stop Time 1017   PT Time Calculation (min) 50 min   Activity Tolerance Patient tolerated treatment well      Past Medical History  Diagnosis Date  . Diabetes mellitus without complication   . Hypertension     History reviewed. No pertinent past surgical history.  There were no vitals taken for this visit.  Visit Diagnosis:  Difficult walk-unspec  Weakness generalized      Subjective Assessment - 11/30/13 0935    Symptoms Pt reports fall out of bed while reaching to put socks on.  Had to have help getting up from floor.   Currently in Pain? No/denies            [facility] Adult PT Treatment/Exercise - 11/30/13 0954    Transfers   Transfers Sit to Stand;Stand to Sit   Sit to Stand 5: Supervision;With upper extremity assist   Sit to Stand Details (indicate cue type and reason) verbal cues to use both hands   Stand to Sit 5: Supervision;With upper extremity assist   Ambulation/Gait   Ambulation/Gait Yes   Ambulation/Gait Assistance 4: Min guard   Ambulation Distance (Feet) 100 Feet  times 2   Assistive device Rollator   Gait Pattern Step-to pattern;Decreased step length - right;Decreased step length - left;Decreased dorsiflexion - right;Decreased dorsiflexion - left;Shuffle   Gait velocity decreased   Dynamic Standing Balance   Dynamic Standing - Balance Support Bilateral upper extremity supported;Right upper extremity supported;No upper extremity supported   Dynamic Standing - Level of Assistance 4: Min assist;5: Stand by assistance   Dynamic Standing - Balance Activities Foam balance beam;Alternating  foot  traps;Eyes open;Other (comment)  Initially used bil UE,progressed to 1 UE then none.   Dynamic Standing - Comments performed in parallel bars.  Taps were to 4" step.   Knee/Hip Exercises: Aerobic   Stationary Bike x 6 minutes all 4 extremities level 1.0 on Scifit   Knee/Hip Exercises: Standing   Other Standing Knee Exercises Standing in parallel bars for bil hip SLR, hip extension, hip abduction, heel raises, toes raises, marching, hamstring curl-all x 15 with 2# weight   Transfers   Sit to Stand Details Other (comment)  x 6 reps from mat             Plan - 11/30/13 1220    Clinical Impression Statement Patients daughter states he was fatigued after last session but felt he got a good treatment.  Pt needs cues for safety and gait with step lenght and foot clearance.   Pt will benefit from skilled therapeutic intervention in order to improve on the following deficits Abnormal gait;Decreased balance;Decreased knowledge of use of DME;Decreased mobility;Decreased strength;Decreased endurance;Impaired flexibility   Rehab Potential Good   PT Frequency 2x / week   PT Duration 8 weeks   PT Treatment/Interventions ADLs/Self Care Home Management;Gait training;Neuromuscular re-education;Stair training;Functional mobility training;Patient/family education;Therapeutic activities;Therapeutic exercise;DME Instruction;Balance training   PT Next Visit Plan Continue balance activities and strengthening   Consulted and Agree with Plan of Care Patient  Problem List Patient Active Problem List   Diagnosis Date Noted  . Heel spur 05/04/2013  . Weakness 04/17/2013  . Anemia of other chronic disease 06/10/2012  . Essential hypertension, benign 06/03/2012  . Type II or unspecified type diabetes mellitus with neurological manifestations, not stated as uncontrolled 06/03/2012  . Abnormality of gait 05/28/2012  . Type I (juvenile type) diabetes mellitus with peripheral circulatory disorders, not  stated as uncontrolled(250.71) 05/28/2012  . Unspecified hereditary and idiopathic peripheral neuropathy 05/28/2012  . Unspecified late effects of cerebrovascular disease 05/28/2012  . Weakness generalized 05/11/2012  . Blind left eye 12/11/2011  . Neuropathy 12/11/2011  . Elevated LFTs 11/12/2011  . Diabetes mellitus 03/26/2011  . Hyperlipidemia 03/26/2011  . CVA (cerebral infarction) 03/26/2011  . Gait disorder 03/26/2011     [name] 11/30/2013, 12:23 PM  [name], PTA

## 2013-12-02 ENCOUNTER — Encounter: Payer: Self-pay | Admitting: Physical Therapy

## 2013-12-02 ENCOUNTER — Ambulatory Visit: Payer: Medicare Other | Admitting: Physical Therapy

## 2013-12-02 DIAGNOSIS — I639 Cerebral infarction, unspecified: Secondary | ICD-10-CM | POA: Diagnosis not present

## 2013-12-02 DIAGNOSIS — R531 Weakness: Secondary | ICD-10-CM

## 2013-12-02 DIAGNOSIS — R5381 Other malaise: Secondary | ICD-10-CM

## 2013-12-02 DIAGNOSIS — R262 Difficulty in walking, not elsewhere classified: Secondary | ICD-10-CM

## 2013-12-02 NOTE — Therapy (Signed)
Physical Therapy Treatment  Patient Details  Name: Curtis Brock MRN: [id number] Date of Birth: 1925-04-14  Encounter Date: 12/02/2013      PT End of Session - 12/02/13 1030    Visit Number 8   Number of Visits 16   Date for PT Re-Evaluation 12/28/13   Authorization Type UHC Medicare   Authorization Time Period 11/02/13-12/31/13   PT Start Time 0926   PT Stop Time 1018   PT Time Calculation (min) 52 min   Activity Tolerance Patient tolerated treatment well      Past Medical History  Diagnosis Date  . Diabetes mellitus without complication   . Hypertension     History reviewed. No pertinent past surgical history.  There were no vitals taken for this visit.  Visit Diagnosis:  Difficult walk-unspec  Weakness generalized  Debility      Subjective Assessment - 12/02/13 0932    Symptoms Denies falls or pain.   Currently in Pain? No/denies          [facility] Adult PT Treatment/Exercise - 12/02/13 1009    Transfers   Transfers Sit to Stand;Stand to Sit   Sit to Stand 5: Supervision;With upper extremity assist   Sit to Stand Details (indicate cue type and reason) verbal cues for anterior weight shift-x 10 reps   Stand to Sit 5: Supervision;With upper extremity assist   Ambulation/Gait   Ambulation/Gait Yes   Ambulation/Gait Assistance 4: Min guard   Ambulation Distance (Feet) 100 Feet  times 2   Assistive device Rollator   Gait Pattern Step-to pattern;Decreased step length - right;Decreased step length - left;Decreased dorsiflexion - right;Decreased dorsiflexion - left;Shuffle   Gait velocity decreased   Dynamic Standing Balance   Dynamic Standing - Balance Support Bilateral upper extremity supported  progressed to 1 UE then min UE support   Dynamic Standing - Level of Assistance 4: Min assist   Dynamic Standing - Balance Activities Foam balance beam   Dynamic Standing - Comments in parallel bars static and head turns   Knee/Hip Exercises: Aerobic   Stationary  Bike x 8 minutes all 4 extremities level 1.0 on Scifit   Knee/Hip Exercises: Standing   Heel Raises 20 reps   Other Standing Knee Exercises Exercises performed with 2# weight-hip flexion, abd, SLR,hamstring curl          PT Education - 12/02/13 1029    Education provided Yes   Education Details Discussed importance of activity and being out of bed more   Person(s) Educated Patient;Child(ren)   Methods Explanation   Comprehension Verbalized understanding;Need further instruction            Plan - 12/02/13 1031    Clinical Impression Statement Patients daughter reports patient spends most of his day in bed and only gets up for meals.  Discussion with patient and daughter the need for increased time out of bed.  Discussed starting with staying up from breakfast till lunch then getting back in bed for nap if needed but back up for dinner.   Pt will benefit from skilled therapeutic intervention in order to improve on the following deficits Abnormal gait;Decreased balance;Decreased knowledge of use of DME;Decreased mobility;Decreased strength;Decreased endurance;Impaired flexibility   Rehab Potential Good   PT Frequency 2x / week   PT Duration 8 weeks   PT Treatment/Interventions ADLs/Self Care Home Management;Gait training;Neuromuscular re-education;Stair training;Functional mobility training;Patient/family education;Therapeutic activities;Therapeutic exercise;DME Instruction;Balance training   PT Next Visit Plan Check STG's.  Continue balance activities and strengthening.  Discuss OOB at home and walking program.   Consulted and Agree with Plan of Care Patient;Family member/caregiver      Problem List Patient Active Problem List   Diagnosis Date Noted  . Heel spur 05/04/2013  . Weakness 04/17/2013  . Anemia of other chronic disease 06/10/2012  . Essential hypertension, benign 06/03/2012  . Type II or unspecified type diabetes mellitus with neurological manifestations, not stated  as uncontrolled 06/03/2012  . Abnormality of gait 05/28/2012  . Type I (juvenile type) diabetes mellitus with peripheral circulatory disorders, not stated as uncontrolled(250.71) 05/28/2012  . Unspecified hereditary and idiopathic peripheral neuropathy 05/28/2012  . Unspecified late effects of cerebrovascular disease 05/28/2012  . Weakness generalized 05/11/2012  . Blind left eye 12/11/2011  . Neuropathy 12/11/2011  . Elevated LFTs 11/12/2011  . Diabetes mellitus 03/26/2011  . Hyperlipidemia 03/26/2011  . CVA (cerebral infarction) 03/26/2011  . Gait disorder 03/26/2011     Curtis Brock 12/02/2013, 11:22 AM    Curtis Brock, PTA 11:22 AM

## 2013-12-06 ENCOUNTER — Encounter: Payer: Self-pay | Admitting: Physical Therapy

## 2013-12-06 ENCOUNTER — Ambulatory Visit: Payer: Medicare Other | Admitting: Physical Therapy

## 2013-12-06 DIAGNOSIS — R531 Weakness: Secondary | ICD-10-CM

## 2013-12-06 DIAGNOSIS — I639 Cerebral infarction, unspecified: Secondary | ICD-10-CM | POA: Diagnosis not present

## 2013-12-06 DIAGNOSIS — R262 Difficulty in walking, not elsewhere classified: Secondary | ICD-10-CM

## 2013-12-06 DIAGNOSIS — R5381 Other malaise: Secondary | ICD-10-CM

## 2013-12-06 NOTE — Therapy (Signed)
Physical Therapy Treatment  Patient Details  Name: Curtis Brock MRN: [id number] Date of Birth: [birth year]  Encounter Date: 12/06/2013      PT End of Session - 12/06/13 0855    Visit Number 9   Number of Visits 16   Date for PT Re-Evaluation 12/28/13   Authorization Type UHC Medicare   Authorization Time Period 11/02/13-12/31/13   PT Start Time 0802   PT Stop Time 0840   PT Time Calculation (min) 38 min   Activity Tolerance Patient tolerated treatment well      Past Medical History  Diagnosis Date  . Diabetes mellitus without complication   . Hypertension     History reviewed. No pertinent past surgical history.  There were no vitals taken for this visit.  Visit Diagnosis:  Difficult walk-unspec  Weakness generalized  Debility      Subjective Assessment - 12/06/13 0809    Symptoms Son in law reports pt has been sitting up more.  "I've been staying up and sitting in the chair."   Currently in Pain? Yes   Pain Score 8    Pain Location Knee   Pain Orientation Right   Pain Descriptors / Indicators Aching   Pain Type Chronic pain           [facility] Adult PT Treatment/Exercise - 12/06/13 0813    Transfers   Transfers Sit to Stand;Stand to Sit   Sit to Stand With upper extremity assist;Without upper extremity assist;6: Modified independent (Device/Increase time)   Sit to Stand Details (indicate cue type and reason) improved today from previous visits   Stand to Sit 6: Modified independent (Device/Increase time)   Ambulation/Gait   Ambulation/Gait Yes   Ambulation/Gait Assistance 5: Supervision   Ambulation/Gait Assistance Details cues for step length and upright posture   Ambulation Distance (Feet) 150 Feet  then 100'x2   Assistive device Rollator   Gait Pattern Step-to pattern;Decreased step length - right;Decreased step length - left;Decreased dorsiflexion - right;Decreased dorsiflexion - left;Shuffle   Gait velocity 18.93 seconds for 62m1.73 ft/second   Berg Balance Test   Sit to Stand Able to stand  independently using hands   Standing Unsupported Able to stand safely 2 minutes   Sitting with Back Unsupported but Feet Supported on Floor or Stool Able to sit safely and securely 2 minutes   Stand to Sit Sits safely with minimal use of hands   Transfers Able to transfer safely, minor use of hands   Standing Unsupported with Eyes Closed Able to stand 10 seconds safely   Standing Ubsupported with Feet Together Able to place feet together independently and stand 1 minute safely   From Standing, Reach Forward with Outstretched Arm Can reach forward >5 cm safely (2")   From Standing Position, Pick up Object from Floor Unable to pick up shoe, but reaches 2-5 cm (1-2") from shoe and balances independently   From Standing Position, Turn to Look Behind Over each Shoulder Looks behind one side only/other side shows less weight shift   Turn 360 Degrees Needs close supervision or verbal cueing   Standing Unsupported, Alternately Place Feet on Step/Stool Able to complete >2 steps/needs minimal assist   Standing Unsupported, One Foot in Front Able to take small step independently and hold 30 seconds   Standing on One Leg Tries to lift leg/unable to hold 3 seconds but remains standing independently   Total Score 39   Timed Up and Go Test   TUG Normal TUG  Normal TUG (seconds) 41.05          PT Education - 12/06/13 0854    Education provided Yes   Education Details Importance of continuing time out of bed   Person(s) Educated Patient;Caregiver(s)   Methods Explanation   Comprehension Verbalized understanding          PT Short Term Goals - 12/06/13 0856    PT SHORT TERM GOAL #2   Title Increase BERG balance score to decrease falls risk.   Status Achieved   PT SHORT TERM GOAL #3   Title perform TUG in < 60 seconds, with LRAD, to decrease falls risk.   Status Achieved   PT SHORT TERM GOAL #4   Title increase gait speed to >/=2.39f/sec. to  decrease falls risk.   Status Not Met   PT SHORT TERM GOAL #5   Title ambulate 150' over even/uneven terrain, with LRAD and supervision, to decrease falls risk.   Status Achieved          Plan - 12/06/13 0856    Clinical Impression Statement Pt's mobility improving. Pt met STG 2,3,5.  #4 unmet.  Family reports improved activity at home.    Pt will benefit from skilled therapeutic intervention in order to improve on the following deficits Abnormal gait;Decreased balance;Decreased knowledge of use of DME;Decreased mobility;Decreased strength;Decreased endurance;Impaired flexibility   PT Frequency 2x / week   PT Duration 8 weeks   PT Treatment/Interventions ADLs/Self Care Home Management;Gait training;Neuromuscular re-education;Stair training;Functional mobility training;Patient/family education;Therapeutic activities;Therapeutic exercise;DME Instruction;Balance training   PT Next Visit Plan Check STG #1 for independence with HEP.  Continue balance and strengthening.   Consulted and Agree with Plan of Care Patient;Family member/caregiver      Problem List Patient Active Problem List   Diagnosis Date Noted  . Heel spur 05/04/2013  . Weakness 04/17/2013  . Anemia of other chronic disease 06/10/2012  . Essential hypertension, benign 06/03/2012  . Type II or unspecified type diabetes mellitus with neurological manifestations, not stated as uncontrolled 06/03/2012  . Abnormality of gait 05/28/2012  . Type I (juvenile type) diabetes mellitus with peripheral circulatory disorders, not stated as uncontrolled(250.71) 05/28/2012  . Unspecified hereditary and idiopathic peripheral neuropathy 05/28/2012  . Unspecified late effects of cerebrovascular disease 05/28/2012  . Weakness generalized 05/11/2012  . Blind left eye 12/11/2011  . Neuropathy 12/11/2011  . Elevated LFTs 11/12/2011  . Diabetes mellitus 03/26/2011  . Hyperlipidemia 03/26/2011  . CVA (cerebral infarction) 03/26/2011  . Gait  disorder 03/26/2011     DNita Sells PTA 12/06/2013 9:02 AM Phone: [phone]: [phone]

## 2013-12-08 ENCOUNTER — Ambulatory Visit: Payer: Medicare Other

## 2013-12-13 ENCOUNTER — Telehealth: Payer: Self-pay

## 2013-12-13 ENCOUNTER — Ambulatory Visit: Payer: Medicare Other

## 2013-12-13 DIAGNOSIS — I639 Cerebral infarction, unspecified: Secondary | ICD-10-CM | POA: Diagnosis not present

## 2013-12-13 DIAGNOSIS — R262 Difficulty in walking, not elsewhere classified: Secondary | ICD-10-CM

## 2013-12-13 DIAGNOSIS — R5381 Other malaise: Secondary | ICD-10-CM

## 2013-12-13 DIAGNOSIS — R531 Weakness: Secondary | ICD-10-CM

## 2013-12-13 NOTE — Therapy (Signed)
Physical Therapy Treatment  Patient Details  Name: Curtis Brock MRN: [id number] Date of Birth: Dec 07, 1925  Encounter Date: 12/13/2013      PT End of Session - 12/13/13 0948    Visit Number 10   Number of Visits 16   Authorization Type [employer]HC Medicare   Authorization Time Period 11/02/13-12/31/13   PT Start Time 0848   PT Stop Time 0929   PT Time Calculation (min) 41 min   Equipment Utilized During Treatment Gait belt   Activity Tolerance Patient tolerated treatment well   Behavior During Therapy [facility] for tasks assessed/performed      Past Medical History  Diagnosis Date  . Diabetes mellitus without complication   . Hypertension     History reviewed. No pertinent past surgical history.  There were no vitals taken for this visit.  Visit Diagnosis:  Difficult walk-unspec  Weakness generalized  Debility      Subjective Assessment - 12/13/13 0853    Symptoms "I feel alright". Pt denied changes or falls since last visit.    Currently in Pain? Yes   Pain Score 8    Pain Location Knee   Pain Orientation Right   Pain Descriptors / Indicators Aching   Pain Type Chronic pain   Pain Onset 1 to 4 weeks ago   Aggravating Factors  nothing   Pain Relieving Factors Medication: Gold-bond cream            [facility] Adult PT Treatment/Exercise - 12/13/13 0848    Transfers   Transfers Sit to Stand;Stand to Sit   Sit to Stand With upper extremity assist;5: Supervision;From chair/3-in-1   Sit to Stand Details (indicate cue type and reason) x10 reps. VC's to improve upright posture.   Stand to Sit 5: Supervision;With upper extremity assist;To chair/3-in-1   Stand to Sit Details x10 reps. VC's to improve eccentric control.   Ambulation/Gait   Ambulation/Gait Yes   Ambulation/Gait Assistance 5: Supervision   Ambulation/Gait Assistance Details VC's to improve stride length and heel strike.   Ambulation Distance (Feet) 150 Feet   Assistive device Rollator   Gait Pattern Step-to  pattern;Decreased step length - right;Decreased step length - left;Decreased dorsiflexion - right;Decreased dorsiflexion - left;Shuffle   Gait velocity decreased.   Balance   Balance Assessed Yes   Static Standing Balance   Static Standing - Balance Support No upper extremity supported   Static Standing - Level of Assistance 5: Stand by assistance   Static Standing - Comment/# of Minutes Standing in corner with rollator locked in front of pt for safety. Feet apart, feet together with eyes open/closed and with/without head turns, with/without arm movement, partial tandem. All with 30 second holds. Pt demonstrated progress, so PT progressed HEP as reported in pt instructions.    Exercises   Exercises Knee/Hip;Ankle   Knee/Hip Exercises: Seated   Long Arc Quad Strengthening;10 reps;2 sets  Red theraband   Long Arc Quad Limitations VC's to perform in full ROM and to improve eccentric control.   Other Seated Knee Exercises Seated 2x10 reps with red theraband: hip abd, hip marches. VC's to improve eccentric control and to use red theraband during hip marches.   Ankle Exercises: Seated   Other Seated Ankle Exercises Seated 2x10 with red theraband L LE: dorsiflexion. No cues required.          PT Education - 12/13/13 0940    Education provided Yes   Education Details Progressed balance HEP and gave pt another copy of LE  strengthening HEP as he was not using red theraband for all exercises.   Person(s) Educated Patient;Caregiver(s)   Methods Explanation;Demonstration;Verbal cues   Comprehension Verbalized understanding;Returned demonstration          PT Short Term Goals - 2013-12-19 0952    PT SHORT TERM GOAL #1   Title Independent in performing HEP to improve safety and balance at home. Target date: 12/28/13.   Status On-going  partially met 12-19-2013.          PT Long Term Goals - 12/19/2013 7494    PT LONG TERM GOAL #1   Title verbalize understanding of: fall prevention strategies  within home environment. Target date: 12/28/13.   Time --  12/28/13   Status On-going   PT LONG TERM GOAL #2   Title improve BERG score to 38/56 to decrease risk of falls. Target date: 12/28/13.   Time --  12/28/13   Status Achieved   PT LONG TERM GOAL #3   Title perform TUG in < 50 sec., with LRAD, to decrease falls risk. Target date: 12/28/13.   Time --  12/28/13   Status Achieved   PT LONG TERM GOAL #4   Title Pt will increase gait speed to >/=2.88f/sec. 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   Time --  12/28/13   Status On-going   PT LONG TERM GOAL #5   Title ambulate 300' over even/uneven terrain, with LRAD and supervision, to decrease falls risk. Target date: 12/28/13.   Time --  12/28/13   Status On-going   PT LONG TERM GOAL #6   Title will increase FOTO functional status by 10 points.   Time --  12/28/13   Status On-going   PT LONG TERM GOAL #7   Title Pt will perform TUG in < 37 sec., with LRAD, to decrease falls risk. Target date: 12/28/13.   Status New   PT LONG TERM GOAL #8   Title Pt will improve BERG score to 41/56 to decrease risk of falls. Target date: 12/28/13.   Status New          Plan - [id number]    Clinical Impression Statement Pt demonstrating progress, as he was able to tolerate higher level static standing balance activities and he met LTGs #2 and #3 last visit. Pt did not meet HEP STG#1, as he required cues to improve eccentric control and was not using red theraband during all LE strengthening therex. Pt would  continue to benefit from PT to improve safety during functional mobility. PT has faxed OT eval request for the 3rd time and is awaiting for MD to sign order.   Pt will benefit from skilled therapeutic intervention in order to improve on the following deficits Abnormal gait;Decreased balance;Decreased knowledge of use of DME;Decreased mobility;Decreased strength;Decreased endurance;Impaired flexibility   Rehab Potential  Good   PT Frequency 2x / week   PT Duration 8 weeks   PT Treatment/Interventions ADLs/Self Care Home Management;Gait training;Neuromuscular re-education;Stair training;Functional mobility training;Patient/family education;Therapeutic activities;Therapeutic exercise;DME Instruction;Balance training   PT Next Visit Plan Dynamic gait training, strengthening and balance training.   Consulted and Agree with Plan of Care Patient;Family member/caregiver   Family Member Consulted son-in-law and daughter.   PT Plan Dynamic gait training and balance activities (weight shifting and improving B knee extension).          G-Codes - [id number]/06/[id number]    Functional Assessment Tool Used BERG: 39/56, Gait velocity: 1.721fsec., TUG with rollator: 41.05 sec.  Functional Limitation Mobility: Walking and moving around   Mobility: Walking and Moving Around Current Status [phone]) At least 40 percent but less than 60 percent impaired, limited or restricted   Mobility: Walking and Moving Around Goal Status [phone]) At least 20 percent but less than 40 percent impaired, limited or restricted      Problem List Patient Active Problem List   Diagnosis Date Noted  . Heel spur 05/04/2013  . Weakness 04/17/2013  . Anemia of other chronic disease 06/10/2012  . Essential hypertension, benign 06/03/2012  . Type II or unspecified type diabetes mellitus with neurological manifestations, not stated as uncontrolled 06/03/2012  . Abnormality of gait 05/28/2012  . Type I (juvenile type) diabetes mellitus with peripheral circulatory disorders, not stated as uncontrolled(250.71) 05/28/2012  . Unspecified hereditary and idiopathic peripheral neuropathy 05/28/2012  . Unspecified late effects of cerebrovascular disease 05/28/2012  . Weakness generalized 05/11/2012  . Blind left eye 12/11/2011  . Neuropathy 12/11/2011  . Elevated LFTs 11/12/2011  . Diabetes mellitus 03/26/2011  . Hyperlipidemia 03/26/2011  . CVA (cerebral  infarction) 03/26/2011  . Gait disorder 03/26/2011                                              Eriona Kinchen L 12/13/2013, 10:03 AM     Geoffry Paradise, PT,DPT 12/13/2013 10:03 AM Phone: [phone] Fax: [phone]

## 2013-12-13 NOTE — Telephone Encounter (Signed)
Geoffry Paradise, PT at [facility] outpt neurorehab called to check status of orders they had sent for OT. Dr Everlene Farrier reported that he has not seen them so I had orders faxed again. Put them in Dr Perfecto Kingdom box for signature.

## 2013-12-13 NOTE — Patient Instructions (Signed)
Perform all balance exercises in corner with chair in front of you for safety.  Feet Partial Heel-toe - Eyes Open   With eyes open, right foot partially in front of the other, keep arms at your side and hold for 30 seconds. Repeat __2__ times per session. Do _1-2___ sessions per day.  Copyright  VHI. All rights reserved.  Feet Together, Arm Motion - Eyes Open   With eyes open, feet together, move arms up and down: to front for 30 seconds. Repeat _2___ times per session. Do __1-2__ sessions per day.  Copyright  VHI. All rights reserved.  Feet Together- Eyes Closed   With eyes closed and feet together, arms at your side for 30 seconds. Repeat __2__ times per session. Do __1-2__ sessions per day.  Copyright  VHI. All rights reserved.  Feet Together, Head Motion - Eyes Open   With eyes open, feet together, move head slowly: up and down and side to side for 30 seconds. Repeat __2__ times per session. Do __1-2__ sessions per day.  Copyright  VHI. All rights reserved.

## 2013-12-17 ENCOUNTER — Ambulatory Visit: Payer: Medicare Other | Attending: Emergency Medicine

## 2013-12-17 DIAGNOSIS — R5381 Other malaise: Secondary | ICD-10-CM | POA: Insufficient documentation

## 2013-12-17 DIAGNOSIS — I639 Cerebral infarction, unspecified: Secondary | ICD-10-CM | POA: Insufficient documentation

## 2013-12-17 DIAGNOSIS — M6281 Muscle weakness (generalized): Secondary | ICD-10-CM | POA: Diagnosis not present

## 2013-12-17 DIAGNOSIS — R262 Difficulty in walking, not elsewhere classified: Secondary | ICD-10-CM | POA: Diagnosis not present

## 2013-12-17 DIAGNOSIS — R531 Weakness: Secondary | ICD-10-CM

## 2013-12-17 NOTE — Therapy (Signed)
[facility] [address] [place], [place], [address] Phone: [phone]   Fax:  [phone]  Physical Therapy Treatment  Patient Details  Name: Curtis Brock MRN: [id number] Date of Birth: December 19, 1925  Encounter Date: 12/17/2013      PT End of Session - 12/17/13 1117    Visit Number 11   Number of Visits 16   Date for PT Re-Evaluation 12/28/13   Authorization Type [employer] Medicare   Authorization Time Period 11/02/13-12/31/13   PT Start Time 1015   PT Stop Time 1057   PT Time Calculation (min) 42 min   Equipment Utilized During Treatment Gait belt   Activity Tolerance Patient tolerated treatment well   Behavior During Therapy [facility] for tasks assessed/performed      Past Medical History  Diagnosis Date  . Diabetes mellitus without complication   . Hypertension     History reviewed. No pertinent past surgical history.  There were no vitals taken for this visit.  Visit Diagnosis:  Difficult walk-unspec  Weakness generalized  Debility      Subjective Assessment - 12/17/13 1032    Symptoms "My R knee hurts today." Pt denied changes or falls since last visit.   Currently in Pain? Yes   Pain Score --  3/10 during ambulation and 8/10 at rest.   Pain Location Knee   Pain Orientation Right   Pain Descriptors / Indicators Aching   Pain Type Chronic pain   Pain Onset More than a month ago   Pain Relieving Factors Gold bond cream, walking            [facility] Adult PT Treatment/Exercise - 12/17/13 1044    Ambulation/Gait   Ambulation/Gait Yes   Ambulation/Gait Assistance 5: Supervision   Ambulation/Gait Assistance Details Pt ambulated over even terrain with and without head turns. pt noted to experience decr. gait speed while performing head turns. Pt ambulated in parallel bars with B UE support and min guard while ambulating over ladder to improve stride length and step height. Pt noted to demonstrate improve stride  length/step height while ambulating over even terrain with rollator, after practicing stepping over ladder. VC's to improve B heel strike, stride length, and upright posture.  Pt reported R knee pain decr. to 3/10 during amb.   Ambulation Distance (Feet) --  75', 115', 50', 100'   Assistive device Rollator   Gait Pattern Step-to pattern;Decreased step length - right;Decreased step length - left;Decreased dorsiflexion - right;Decreased dorsiflexion - left;Shuffle   Gait velocity decreased.   Balance   Balance Assessed Yes   Static Standing Balance   Static Standing - Balance Support No upper extremity supported   Static Standing - Level of Assistance 5: Stand by assistance   Static Standing - Comment/# of Minutes see PT note for detail.   Dynamic Standing Balance   Dynamic Standing - Balance Support No upper extremity supported   Dynamic Standing - Level of Assistance 4: Min assist;Other (comment)  min guard   Dynamic Standing - Balance Activities Alternating  foot traps  2"   Dynamic Standing - Comments Alternating foot taps on 2" step, 2x10, with min guard to min A. VC's to improve upright posture and weight shifting.     Neuro re-ed: Standing in corner with rollator locked in front of pt for safety. Feet apart, feet together with eyes open/closed and with/without head turns, with/without arm movement, partial tandem. All with 30 second holds x1-2 reps. (reviewed new HEP as pt wanted  to ensure he was completing HEP correctly. Pt demonstrated proper technique, performed with supervision.       PT Short Term Goals - 12/17/13 1126    PT SHORT TERM GOAL #1   Title Independent in performing HEP to improve safety and balance at home. Target date: 12/28/13.   Status On-going   PT SHORT TERM GOAL #2   Title Increase BERG balance score to decrease falls risk.   Status Achieved   PT SHORT TERM GOAL #3   Title perform TUG in < 60 seconds, with LRAD, to decrease falls risk.   Status Achieved    PT SHORT TERM GOAL #4   Title increase gait speed to >/=2.49f/sec. to decrease falls risk.   Status Not Met   PT SHORT TERM GOAL #5   Title ambulate 150' over even/uneven terrain, with LRAD and supervision, to decrease falls risk.   Status Achieved          PT Long Term Goals - 12/17/13 1126    PT LONG TERM GOAL #1   Title verbalize understanding of: fall prevention strategies within home environment. Target date: 12/28/13.   Time --  12/28/13   Status On-going   PT LONG TERM GOAL #2   Title improve BERG score to 38/56 to decrease risk of falls. Target date: 12/28/13.   Time --  12/28/13   Status Achieved   PT LONG TERM GOAL #3   Title perform TUG in < 50 sec., with LRAD, to decrease falls risk. Target date: 12/28/13.   Time --  12/28/13   Status Achieved   PT LONG TERM GOAL #4   Title Pt will increase gait speed to >/=2.510fsec. to be a safe coHydrographic surveyorTarget date: 12/28/13.   Time --  12/28/13   Status On-going   PT LONG TERM GOAL #5   Title ambulate 300' over even/uneven terrain, with LRAD and supervision, to decrease falls risk. Target date: 12/28/13.   Time --  12/28/13   Status On-going   PT LONG TERM GOAL #6   Title will increase FOTO functional status by 10 points.   Time --  12/28/13   Status On-going   PT LONG TERM GOAL #7   Title Pt will perform TUG in < 37 sec., with LRAD, to decrease falls risk. Target date: 12/28/13.   Status On-going   PT LONG TERM GOAL #8   Title Pt will improve BERG score to 41/56 to decrease risk of falls. Target date: 12/28/13.   Status On-going          Plan - 12/17/13 1117    Clinical Impression Statement Pt demonstrated progress, as he experienced improved stride length and step height after ambulating in parallel bars with ladder on floor. Continue with POC to improve safety during functional mobility. Pt scheduled OT eval at checkout today.   Pt will benefit from skilled therapeutic intervention in order to  improve on the following deficits Abnormal gait;Decreased balance;Decreased knowledge of use of DME;Decreased mobility;Decreased strength;Decreased endurance;Impaired flexibility   Rehab Potential Good   PT Frequency 2x / week   PT Duration 8 weeks   PT Treatment/Interventions ADLs/Self Care Home Management;Gait training;Neuromuscular re-education;Stair training;Functional mobility training;Patient/family education;Therapeutic activities;Therapeutic exercise;DME Instruction;Balance training   PT Next Visit Plan Continue to use ladder to improve stride length (in parallel bars), improve weight shifting and dynamic gait training.   Consulted and Agree with Plan of Care Patient  Problem List Patient Active Problem List   Diagnosis Date Noted  . Heel spur 05/04/2013  . Weakness 04/17/2013  . Anemia of other chronic disease 06/10/2012  . Essential hypertension, benign 06/03/2012  . Type II or unspecified type diabetes mellitus with neurological manifestations, not stated as uncontrolled 06/03/2012  . Abnormality of gait 05/28/2012  . Type I (juvenile type) diabetes mellitus with peripheral circulatory disorders, not stated as uncontrolled(250.71) 05/28/2012  . Unspecified hereditary and idiopathic peripheral neuropathy 05/28/2012  . Unspecified late effects of cerebrovascular disease 05/28/2012  . Weakness generalized 05/11/2012  . Blind left eye 12/11/2011  . Neuropathy 12/11/2011  . Elevated LFTs 11/12/2011  . Diabetes mellitus 03/26/2011  . Hyperlipidemia 03/26/2011  . CVA (cerebral infarction) 03/26/2011  . Gait disorder 03/26/2011    Dinorah Masullo L 12/17/2013, 11:29 AM     Geoffry Paradise, PT,DPT 12/17/2013 11:29 AM Phone: [phone] Fax: [phone]

## 2013-12-20 ENCOUNTER — Ambulatory Visit: Payer: Medicare Other

## 2013-12-20 DIAGNOSIS — I639 Cerebral infarction, unspecified: Secondary | ICD-10-CM | POA: Diagnosis not present

## 2013-12-20 DIAGNOSIS — R531 Weakness: Secondary | ICD-10-CM

## 2013-12-20 DIAGNOSIS — R262 Difficulty in walking, not elsewhere classified: Secondary | ICD-10-CM

## 2013-12-20 DIAGNOSIS — R5381 Other malaise: Secondary | ICD-10-CM

## 2013-12-20 NOTE — Therapy (Signed)
[facility] [address] [place], [place], [address] Phone: [phone]   Fax:  [phone]  Physical Therapy Treatment  Patient Details  Name: Curtis Brock MRN: [id number] Date of Birth: 12-06-1925  Encounter Date: 12/20/2013      PT End of Session - 12/20/13 0903    Visit Number 12   Number of Visits 16   Date for PT Re-Evaluation 12/28/13   Authorization Type [employer] Medicare   Authorization Time Period 11/02/13-12/31/13   PT Start Time 0801   PT Stop Time 0843   PT Time Calculation (min) 42 min   Equipment Utilized During Treatment Gait belt   Activity Tolerance Patient tolerated treatment well   Behavior During Therapy [facility] for tasks assessed/performed      Past Medical History  Diagnosis Date  . Diabetes mellitus without complication   . Hypertension     History reviewed. No pertinent past surgical history.  There were no vitals taken for this visit.  Visit Diagnosis:  Difficult walk-unspec  Weakness generalized  Debility      Subjective Assessment - 12/20/13 0805    Symptoms "My knee feels better today." Pt denied falls since last visit. Pt reported he is taking an over-the-counter medication to decr. R knee pain. Pt reported HEP is still challenging and has not become too easy.   Currently in Pain? No/denies            [facility] Adult PT Treatment/Exercise - 12/20/13 0806    Transfers   Transfers Sit to Stand;Stand to Sit   Sit to Stand With upper extremity assist;5: Supervision;From chair/3-in-1   Sit to Stand Details (indicate cue type and reason) x10. VC's for hand placement and weight shifting forward.   Stand to Sit 5: Supervision;With upper extremity assist;To chair/3-in-1   Stand to Sit Details x10 reps. VC's for hand placement   Ambulation/Gait   Ambulation/Gait Yes   Ambulation/Gait Assistance 5: Supervision   Ambulation/Gait Assistance Details Pt ambulated over even terrain, with and without head  turns, with supervision. Pt also ambulated in parallel bars while stepping over ladder (4x7') to improve stride length and step height. VC's to improve upright posture and stride length. Pt required seated rest breaks due to fatigue.   Ambulation Distance (Feet) --  2x230', 75', 110'   Assistive device Rollator   Gait Pattern Step-to pattern;Decreased step length - right;Decreased step length - left;Decreased dorsiflexion - right;Decreased dorsiflexion - left;Shuffle   Gait velocity decreased.   Balance   Balance Assessed Yes   Dynamic Standing Balance   Dynamic Standing - Balance Support No upper extremity supported   Dynamic Standing - Level of Assistance Other (comment)  min guard   Dynamic Standing - Balance Activities Step ups;Other (comment)  step over 1/2' and 1/4' foam beam   Dynamic Standing - Comments B LE x10/activity: In parallel bars with UE support for step over and 2" step ups with 1 UE on locked rollator for support and min guard to ensure safety. VC's to improve weight shifting in order to clear stepping LE.          PT Education - 12/20/13 0902    Education provided Yes   Education Details Educated pt on the importance of hand placement during sit<>stand, as pt placed UEs on rollator to perform sit to stand in lobby area.   Person(s) Educated Patient   Methods Explanation;Demonstration;Verbal cues   Comprehension Returned demonstration;Verbalized understanding  PT Short Term Goals - 12/20/13 0906    PT SHORT TERM GOAL #1   Title Independent in performing HEP to improve safety and balance at home. Target date: 12/28/13.   Status On-going   PT SHORT TERM GOAL #2   Title Increase BERG balance score to decrease falls risk.   Status Achieved   PT SHORT TERM GOAL #3   Title perform TUG in < 60 seconds, with LRAD, to decrease falls risk.   Status Achieved   PT SHORT TERM GOAL #4   Title increase gait speed to >/=2.35f/sec. to decrease falls risk.   Status  Not Met   PT SHORT TERM GOAL #5   Title ambulate 150' over even/uneven terrain, with LRAD and supervision, to decrease falls risk.   Status Achieved          PT Long Term Goals - 12/20/13 0906    PT LONG TERM GOAL #1   Title verbalize understanding of: fall prevention strategies within home environment. Target date: 12/28/13.   Time --  12/28/13   Status On-going   PT LONG TERM GOAL #2   Title improve BERG score to 38/56 to decrease risk of falls. Target date: 12/28/13.   Time --  12/28/13   Status Achieved   PT LONG TERM GOAL #3   Title perform TUG in < 50 sec., with LRAD, to decrease falls risk. Target date: 12/28/13.   Time --  12/28/13   Status Achieved   PT LONG TERM GOAL #4   Title Pt will increase gait speed to >/=2.548fsec. to be a safe coHydrographic surveyorTarget date: 12/28/13.   Time --  12/28/13   Status On-going   PT LONG TERM GOAL #5   Title ambulate 300' over even/uneven terrain, with LRAD and supervision, to decrease falls risk. Target date: 12/28/13.   Time --  12/28/13   Status On-going   PT LONG TERM GOAL #6   Title will increase FOTO functional status by 10 points.   Time --  12/28/13   Status On-going   PT LONG TERM GOAL #7   Title Pt will perform TUG in < 37 sec., with LRAD, to decrease falls risk. Target date: 12/28/13.   Status On-going   PT LONG TERM GOAL #8   Title Pt will improve BERG score to 41/56 to decrease risk of falls. Target date: 12/28/13.   Status On-going          Plan - 12/20/13 0903    Clinical Impression Statement Pt demonstrated progress as he was able to ambulate longer distances before requiring rest breaks, and was able to perform 2" step ups vs. toe taps only. Pt noted to decr. ambulation speed in order to perform head turns. Pt would continue to benefit from skilled therapy to improve safety during functiona mobilty.   Pt will benefit from skilled therapeutic intervention in order to improve on the following deficits  Abnormal gait;Decreased balance;Decreased knowledge of use of DME;Decreased mobility;Decreased strength;Decreased endurance;Impaired flexibility   PT Frequency 2x / week   PT Duration 8 weeks   PT Treatment/Interventions ADLs/Self Care Home Management;Gait training;Neuromuscular re-education;Stair training;Functional mobility training;Patient/family education;Therapeutic activities;Therapeutic exercise;DME Instruction;Balance training   PT Next Visit Plan Balance activities (weight shifting), dynamic gait activities (turns).   Consulted and Agree with Plan of Care Patient  Problem List Patient Active Problem List   Diagnosis Date Noted  . Heel spur 05/04/2013  . Weakness 04/17/2013  . Anemia of other chronic disease 06/10/2012  . Essential hypertension, benign 06/03/2012  . Type II or unspecified type diabetes mellitus with neurological manifestations, not stated as uncontrolled 06/03/2012  . Abnormality of gait 05/28/2012  . Type I (juvenile type) diabetes mellitus with peripheral circulatory disorders, not stated as uncontrolled(250.71) 05/28/2012  . Unspecified hereditary and idiopathic peripheral neuropathy 05/28/2012  . Unspecified late effects of cerebrovascular disease 05/28/2012  . Weakness generalized 05/11/2012  . Blind left eye 12/11/2011  . Neuropathy 12/11/2011  . Elevated LFTs 11/12/2011  . Diabetes mellitus 03/26/2011  . Hyperlipidemia 03/26/2011  . CVA (cerebral infarction) 03/26/2011  . Gait disorder 03/26/2011    Hamda Klutts L 12/20/2013, 9:08 AM     Geoffry Paradise, PT,DPT 12/20/2013 9:08 AM Phone: [phone] Fax: [phone]

## 2013-12-24 ENCOUNTER — Ambulatory Visit: Payer: Medicare Other | Admitting: Occupational Therapy

## 2013-12-24 ENCOUNTER — Encounter: Payer: Self-pay | Admitting: Occupational Therapy

## 2013-12-24 ENCOUNTER — Ambulatory Visit: Payer: Medicare Other

## 2013-12-24 DIAGNOSIS — Z7409 Other reduced mobility: Secondary | ICD-10-CM

## 2013-12-24 DIAGNOSIS — R5381 Other malaise: Secondary | ICD-10-CM

## 2013-12-24 DIAGNOSIS — R279 Unspecified lack of coordination: Secondary | ICD-10-CM

## 2013-12-24 DIAGNOSIS — R278 Other lack of coordination: Secondary | ICD-10-CM

## 2013-12-24 DIAGNOSIS — R262 Difficulty in walking, not elsewhere classified: Secondary | ICD-10-CM

## 2013-12-24 DIAGNOSIS — R531 Weakness: Secondary | ICD-10-CM

## 2013-12-24 DIAGNOSIS — M6281 Muscle weakness (generalized): Secondary | ICD-10-CM

## 2013-12-24 DIAGNOSIS — I639 Cerebral infarction, unspecified: Secondary | ICD-10-CM | POA: Diagnosis not present

## 2013-12-24 NOTE — Therapy (Signed)
[facility] [address] [place], [place], [address] Phone: [phone]   Fax:  [phone]  Physical Therapy Treatment  Patient Details  Name: Curtis Brock MRN: [id number] Date of Birth: Dec 19, 1925  Encounter Date: 12/24/2013      PT End of Session - 12/24/13 1404    Visit Number 13   Number of Visits 16   Date for PT Re-Evaluation 12/28/13   Authorization Type [employer] Medicare   Authorization Time Period 11/02/13-12/31/13   PT Start Time 1016   PT Stop Time 1100   PT Time Calculation (min) 44 min   Equipment Utilized During Treatment Gait belt   Activity Tolerance Patient tolerated treatment well   Behavior During Therapy [facility] for tasks assessed/performed      Past Medical History  Diagnosis Date  . Diabetes mellitus without complication   . Hypertension     History reviewed. No pertinent past surgical history.  There were no vitals taken for this visit.  Visit Diagnosis:  Difficult walk-unspec  Weakness generalized  Debility      Subjective Assessment - 12/24/13 1020    Symptoms Pt denied falls or changes since last visit. Pt reported he's performing HEP as prescribed.   Currently in Pain? Yes   Pain Score 8    Pain Location Knee   Pain Orientation Right   Pain Descriptors / Indicators Aching   Pain Type Chronic pain   Pain Onset More than a month ago   Pain Frequency Constant   Aggravating Factors  Nothing   Pain Relieving Factors Tylenol and gold bond cream            [facility] Adult PT Treatment/Exercise - 12/24/13 1101    Ambulation/Gait   Ambulation/Gait Yes   Ambulation/Gait Assistance 5: Supervision   Ambulation/Gait Assistance Details Pt ambulated over step ladder in parallel bars with B UE support, to improve stride length (4x7') and ambulated 275' in 5 minutes and 30 sec. Pt required seated rest breaks after ambulation due to fatigue and R knee pain.  Max VC's to improve stride length/step height,  which improved after ambulating over ladder.    Ambulation Distance (Feet) --  61' in 5 minutes and 30 sec., 4x7', 75'x2.   Assistive device Rollator   Gait Pattern Step-to pattern;Decreased step length - right;Decreased step length - left;Decreased dorsiflexion - right;Decreased dorsiflexion - left;Shuffle   Gait velocity decreased.          PT Education - 12/24/13 1403    Education provided Yes   Education Details Discussed the importance of performing HEP as prescribed, as pt's daughter pt does not always perform HEP. PT educated pt on the importance of ambulation at home to improve strength and endurance. Also, discussed goals and PT progress with pt's daughter.   Person(s) Educated Patient;Child(ren)   Methods Explanation;Verbal cues   Comprehension Verbalized understanding          PT Short Term Goals - 12/24/13 1408    PT SHORT TERM GOAL #1   Title Independent in performing HEP to improve safety and balance at home. Target date: 12/28/13.   Status On-going   PT SHORT TERM GOAL #2   Title Increase BERG balance score to decrease falls risk.   Status Achieved   PT SHORT TERM GOAL #3   Title perform TUG in < 60 seconds, with LRAD, to decrease falls risk.   Status Achieved   PT SHORT TERM GOAL #4   Title increase gait speed  to >/=2.72f/sec. to decrease falls risk.   Status Not Met   PT SHORT TERM GOAL #5   Title ambulate 150' over even/uneven terrain, with LRAD and supervision, to decrease falls risk.   Status Achieved          PT Long Term Goals - 12/24/13 1408    PT LONG TERM GOAL #1   Title verbalize understanding of: fall prevention strategies within home environment. Target date: 12/28/13.   Time --  12/28/13   Status On-going   PT LONG TERM GOAL #2   Title improve BERG score to 38/56 to decrease risk of falls. Target date: 12/28/13.   Time --  12/28/13   Status Achieved   PT LONG TERM GOAL #3   Title perform TUG in < 50 sec., with LRAD, to decrease  falls risk. Target date: 12/28/13.   Time --  12/28/13   Status Achieved   PT LONG TERM GOAL #4   Title Pt will increase gait speed to >/=2.573fsec. to be a safe coHydrographic surveyorTarget date: 12/28/13.   Time --  12/28/13   Status On-going   PT LONG TERM GOAL #5   Title ambulate 300' over even/uneven terrain, with LRAD and supervision, to decrease falls risk. Target date: 12/28/13.   Time --  12/28/13   Status On-going   PT LONG TERM GOAL #6   Title will increase FOTO functional status by 10 points.   Time --  12/28/13   Status On-going   PT LONG TERM GOAL #7   Title Pt will perform TUG in < 37 sec., with LRAD, to decrease falls risk. Target date: 12/28/13.   Status On-going   PT LONG TERM GOAL #8   Title Pt will improve BERG score to 41/56 to decrease risk of falls. Target date: 12/28/13.   Status On-going          Plan - 12/24/13 1405    Clinical Impression Statement Pt demonstrated progress as he was able to ambulate longer distances today. Pt continues to require max VC's to improve stride length especially when rounding turns on blue track, which improved after ambulating over ladder. Pt would continue to benefit from PT to improve safety during functional mobility.   Pt will benefit from skilled therapeutic intervention in order to improve on the following deficits Abnormal gait;Decreased balance;Decreased knowledge of use of DME;Decreased mobility;Decreased strength;Decreased endurance;Impaired flexibility   Rehab Potential Good   PT Frequency 2x / week   PT Duration 8 weeks   PT Treatment/Interventions ADLs/Self Care Home Management;Gait training;Neuromuscular re-education;Stair training;Functional mobility training;Patient/family education;Therapeutic activities;Therapeutic exercise;DME Instruction;Balance training   PT Next Visit Plan Balance activities (weight shifting), dynamic gait activities (turns).   Consulted and Agree with Plan of Care Patient;Family  member/caregiver   Family Member Consulted pt's daughter                               Problem List Patient Active Problem List   Diagnosis Date Noted  . Heel spur 05/04/2013  . Weakness 04/17/2013  . Anemia of other chronic disease 06/10/2012  . Essential hypertension, benign 06/03/2012  . Type II or unspecified type diabetes mellitus with neurological manifestations, not stated as uncontrolled 06/03/2012  . Abnormality of gait 05/28/2012  . Type I (juvenile type) diabetes mellitus with peripheral circulatory disorders, not stated as uncontrolled(250.71) 05/28/2012  . Unspecified hereditary and idiopathic peripheral neuropathy 05/28/2012  . Unspecified late effects of  cerebrovascular disease 05/28/2012  . Weakness generalized 05/11/2012  . Blind left eye 12/11/2011  . Neuropathy 12/11/2011  . Elevated LFTs 11/12/2011  . Diabetes mellitus 03/26/2011  . Hyperlipidemia 03/26/2011  . CVA (cerebral infarction) 03/26/2011  . Gait disorder 03/26/2011    Arshia Rondon L 12/24/2013, 2:10 PM     Geoffry Paradise, PT,DPT 12/24/2013 2:10 PM Phone: [phone] Fax: [phone]

## 2013-12-24 NOTE — Patient Instructions (Signed)
Walk at home with your walker, for 5 minutes and 30 seconds every day. Increase your walking time by 30 seconds when 5 minutes and 30 seconds becomes easy.  You can also try to walk twice a day. The goal is to walk 20-30 minutes total each day.

## 2013-12-24 NOTE — Therapy (Signed)
[facility] [address] [place], [place], [address] Phone: [phone]   Fax:  [phone]  Occupational Therapy Evaluation  Patient Details  Name: Curtis Brock MRN: [id number] Date of Birth: 03-Sep-1925  Encounter Date: 12/24/2013      OT End of Session - 12/24/13 1217    Visit Number 1   Number of Visits 16  1/10 G   Date for OT Re-Evaluation 01/21/14   Authorization Type 1/10 G   OT Start Time [phone]   OT Stop Time 1016   OT Time Calculation (min) 38 min   Activity Tolerance Patient tolerated treatment well   Behavior During Therapy [facility] for tasks assessed/performed      Past Medical History  Diagnosis Date  . Diabetes mellitus without complication   . Hypertension     History reviewed. No pertinent past surgical history.  There were no vitals taken for this visit.  Visit Diagnosis:  Generalized muscle weakness - Plan: Ot plan of care cert/re-cert  Decreased coordination - Plan: Ot plan of care cert/re-cert  Impaired functional mobility, balance, and endurance - Plan: Ot plan of care cert/re-cert      Subjective Assessment - 12/24/13 0940    Symptoms Pt is an 80 y/o male dx h/o of CVA W/ difficulty w/ gait, balance and generalized weakness. His family expresses that he is having increased difficulty with ADL's, expecially bathing, dressing and toileting due to deficits.    Currently in Pain? Yes   Pain Score 3    Pain Location Knee   Pain Orientation Right   Pain Descriptors / Indicators Aching   Pain Type Chronic pain   Pain Onset More than a month ago   Aggravating Factors  Naothing   Pain Relieving Factors ?Gold bond cream and walking   Multiple Pain Sites No          [facility] OT Assessment - 12/24/13 0001    Assessment   Diagnosis Decreased balance, impaired gait, CVA affecting ability to perform ADL's (bahting, dressing and toileting)   Prior Therapy PT x4 weeks out-pt Neuro Rehab   Precautions    Precautions Fall   Required Braces or Orthoses --  Ambulates w/ rollator   Balance Screen   Has the patient fallen in the past 6 months No   Has the patient had a decrease in activity level because of a fear of falling?  No   Is the patient reluctant to leave their home because of a fear of falling?  No   Home  Environment   Family/patient expects to be discharged to: Private residence   Living Arrangements Other relatives  Lives w/ daughter and son in law   Available Help at Discharge Available 24 hours/day   Type of Home Other (Comment)  Town home   Home Access Level entry  Stays on bottom level bed/bath, full flight to [place] Shower/Tub Tub/Shower unit  Tub on 3rd level of home, son-in-law bathes 2-3 x/week   Shower/tub characteristics Curtain  Tub bench, grab bar   Bathroom Toilet Handicapped height  Has 3:1 but doesnt appear to use   Bathroom Accessibility Yes   How accessible Accessible via walker   Lives With --  Son in law and daughter   ADL   Upper Body Bathing Minimal assistance   Lower Body Bathing Moderate assistance   Upper Body Dressing Minimal assistance  Can don t-shirt but not jacket   Lower  Body Dressing Minimal assistance;Moderate assistance  Can don underwear and sometimes socks, not pants.   Toilet Tranfer Supervision/safety   Toileting - Clothing Manipulation Minimal assistance  Can doff clothing but not pull back up after.   Toileting -  Hygiene Minimal assistance  Difficulty reaching for hygeine, needs assistance   Tub/Shower Transfer Minimal assistance  using tub bench on thrid floor of townhome 2x/week   ADL comments --  Family reports difficulty bathing, dressing and toileting   IADL   Prior Level of Function Meal Prep Family prepares meals   Pt can get snacks, family cuts meat & prepares etc.   Mobility   Mobility Status --  Ambulates w/ rollator, slow, decreased gait   Mobility Status Comments --   Ambulates taking short small steps w/ rollator, slow.    Written Expression   Dominant Hand Right   Vision - History   Baseline Vision Legally blind  L eye blindness, R 20/20, wears glasses   Cognition   Overall Cognitive Status Within Functional Limits for tasks assessed   Memory Appears intact   Sensation   Light Touch Appears Intact   Coordination   Gross Motor Movements are Fluid and Coordinated No  Pt very limited in shoulder ROM; flex, IR/ER   Fine Motor Movements are Fluid and Coordinated No   9 Hole Peg Test R = 85.25 sec L=116.34 sec   Box and Blocks R= 21; L = 24   Coordination Impaired R > L    AROM   Overall AROM  Deficits  Decreased A/PROM bilat shoulders   Strength   Overall Strength Deficits  Generalized weakness noted bilat UE's     Reviewed POC/goals of out-pt OT related to decreased coordination, generalized muscle weakness affecting pt's level of independence with ADL's/IADL's. Both pt and daughter wish to focus therapy on improving pt's ability to bathinging/dress and toileting.        OT Education - 12/24/13 1216    Education provided Yes   Education Details Role of OT, plan to focus on ADL's (bathing, dressing and toileting) & maximizing independence in these areas secondary to decreased coordination & AROM UE's.   Person(s) Educated Patient;Child(ren)   Methods Explanation;Verbal cues   Comprehension Verbalized understanding          OT Short Term Goals - 12/24/13 1238    OT SHORT TERM GOAL #1   Title Pt will be Mod I home program for AROM UE's   Time 4   Period Weeks   Status New   OT SHORT TERM GOAL #2   Title Pt will be Mod I HEP for coordination   Time 4   Period Weeks   Status New   OT SHORT TERM GOAL #3   Title Pt will be Supervision level LB dressing techniques using A/E PRN seated and sit to standing.   Time 4   Period Weeks   Status New          OT Long Term Goals - 12/24/13 1238    OT LONG TERM GOAL #1   Title Pt  will be Mod I updated HEP   Time 8   Period Weeks   Status New   OT LONG TERM GOAL #2   Title Pt will demonstrate increased bilateral UE coordination as seen by improved box and blocks scores by 5 or more blocks each hand.   Time 8   Period Weeks   Status New   OT LONG TERM GOAL #  3   Title Pt will be Min A donning jacket/shirt seated using hemi-dressing type techniques.   Time 8   Period Weeks   Status New   OT LONG TERM GOAL #4   Title Pt will be Mod I toileting and clothing management using a/e PRN   Time 8   Period Weeks   Status New   OT LONG TERM GOAL #5   Title Pt will be supervision level tub  transfer & LB ADL's using A/E PRN    Time 8   Period Weeks   Status New          Plan - 2014/01/11 1219    Clinical Impression Statement Pt is an 80 y/o male w/ h/o CVA w/ noted increased difficulty w/ gait and generalized weakness affecting walking and his ability to perform ADL's. Pt and family would like for pt to increase independence with bathing, dressing and toileting. He should benefit from Out pt OT and skilled therapy to assist with pt/family education,home program, possible a/e to assist in maximizing independence w/ ADL/IADLs.   Rehab Potential Fair   Clinical Impairments Affecting Rehab Potential Age, follow through with home program   OT Frequency 2x / week   OT Duration 8 weeks   OT Treatment/Interventions Self-care/ADL training;DME and/or AE instruction;Therapist, nutritional;Therapeutic exercises;Therapeutic activities;Patient/family education;Balance training   Plan Home program for general AROM bilateral UE's/coordination.   Consulted and Agree with Plan of Care Patient;Family member/caregiver   Family Member Consulted Daughter          G-Codes - 01-11-14 1231    Functional Assessment Tool Used Clinical judgement and box and blocks   Functional Limitation Carrying, moving and handling objects   Carrying, Moving and Handling Objects Current Status  [phone]) At least 60 percent but less than 80 percent impaired, limited or restricted   Carrying, Moving and Handling Objects Goal Status [id number]) At least 20 percent but less than 40 percent impaired, limited or restricted                             Problem List Patient Active Problem List   Diagnosis Date Noted  . Heel spur 05/04/2013  . Weakness 04/17/2013  . Anemia of other chronic disease 06/10/2012  . Essential hypertension, benign 06/03/2012  . Type II or unspecified type diabetes mellitus with neurological manifestations, not stated as uncontrolled 06/03/2012  . Abnormality of gait 05/28/2012  . Type I (juvenile type) diabetes mellitus with peripheral circulatory disorders, not stated as uncontrolled(250.71) 05/28/2012  . Unspecified hereditary and idiopathic peripheral neuropathy 05/28/2012  . Unspecified late effects of cerebrovascular disease 05/28/2012  . Weakness generalized 05/11/2012  . Blind left eye 12/11/2011  . Neuropathy 12/11/2011  . Elevated LFTs 11/12/2011  . Diabetes mellitus 03/26/2011  . Hyperlipidemia 03/26/2011  . CVA (cerebral infarction) 03/26/2011  . Gait disorder 03/26/2011    Almyra Deforest, OT Jan 11, 2014, 12:39 PM

## 2013-12-24 NOTE — Patient Instructions (Signed)
Reviewed POC/goals of out-pt OT related to decreased coordination, generalized muscle weakness affecting pt's level of independence with ADL's/IADL's. Both pt and daughter wish to focus therapy on improving pt's ability to bathinging/dress and toileting.

## 2013-12-27 ENCOUNTER — Ambulatory Visit: Payer: Medicare Other

## 2013-12-27 DIAGNOSIS — R5381 Other malaise: Secondary | ICD-10-CM

## 2013-12-27 DIAGNOSIS — R531 Weakness: Secondary | ICD-10-CM

## 2013-12-27 DIAGNOSIS — I639 Cerebral infarction, unspecified: Secondary | ICD-10-CM | POA: Diagnosis not present

## 2013-12-27 DIAGNOSIS — R262 Difficulty in walking, not elsewhere classified: Secondary | ICD-10-CM

## 2013-12-27 NOTE — Therapy (Signed)
[facility] [address] [place], [place], [address] Phone: [phone]   Fax:  [phone]  Physical Therapy Treatment  Patient Details  Name: Curtis Brock MRN: [id number] Date of Birth: 1925-05-26  Encounter Date: 12/27/2013      PT End of Session - 12/27/13 0854    Visit Number 14   Number of Visits 16   Authorization Type [employer]   Authorization Time Period 11/02/13-12/31/13   PT Start Time 0802   PT Stop Time 0843   PT Time Calculation (min) 41 min   Equipment Utilized During Treatment Gait belt   Activity Tolerance Patient tolerated treatment well   Behavior During Therapy [facility] for tasks assessed/performed      Past Medical History  Diagnosis Date  . Diabetes mellitus without complication   . Hypertension     History reviewed. No pertinent past surgical history.  There were no vitals taken for this visit.  Visit Diagnosis:  Difficult walk-unspec  Weakness generalized  Debility      Subjective Assessment - 12/27/13 0806    Symptoms Pt reported he walked this weekend, but he's not sure how long he walked "I just walked around the house". Pt denied falls since last visit.   Currently in Pain? No/denies            [facility] Adult PT Treatment/Exercise - 12/27/13 0807    Transfers   Transfers Sit to Stand;Stand to Sit   Sit to Stand With upper extremity assist;From chair/3-in-1;6: Modified independent (Device/Increase time)   Sit to Stand Details (indicate cue type and reason) x10. Pt demonstrated correct technique.   Stand to Sit 6: Modified independent (Device/Increase time);With upper extremity assist;To chair/3-in-1   Stand to Sit Details x10. Pt demonstrated safe technique.   Ambulation/Gait   Ambulation/Gait Yes   Ambulation/Gait Assistance 5: Supervision   Ambulation/Gait Assistance Details Pt ambulated over even terrain, while performing head turns. VC's to improve stride length, especially when  turning around corners and while performing vertical head turns. PT incr. rollator height to improve upright posture.   Ambulation Distance (Feet) --  245', 125', 75x2   Assistive device Rollator   Gait Pattern Step-to pattern;Decreased step length - right;Decreased step length - left;Decreased dorsiflexion - right;Decreased dorsiflexion - left;Shuffle   Gait velocity decreased.   Balance   Balance Assessed Yes   Dynamic Standing Balance   Dynamic Standing - Balance Support No upper extremity supported   Dynamic Standing - Level of Assistance 4: Min assist;Other (comment)  min guard   Dynamic Standing - Balance Activities Step ups;Other (comment);Alternating  foot traps  foot taps on 4" step, and step-ups on 2" step   Dynamic Standing - Comments B LE 2x10/taps, 2x5/LE step-ups with min A required during 4 LOB episodes. Pt required short seated rest breaks after each set, due to [place]. VC's to improve weight shifting (ant/lat. Directions).            PT Short Term Goals - 12/27/13 0857    PT SHORT TERM GOAL #1   Title Independent in performing HEP to improve safety and balance at home. Target date: 12/28/13.   Status On-going   PT SHORT TERM GOAL #2   Title Increase BERG balance score to decrease falls risk.   Status Achieved   PT SHORT TERM GOAL #3   Title perform TUG in < 60 seconds, with LRAD, to decrease falls risk.   Status Achieved   PT SHORT TERM GOAL #4  Title increase gait speed to >/=2.48f/sec. to decrease falls risk.   Status Not Met   PT SHORT TERM GOAL #5   Title ambulate 150' over even/uneven terrain, with LRAD and supervision, to decrease falls risk.   Status Achieved          PT Long Term Goals - 12/27/13 0857    PT LONG TERM GOAL #1   Title verbalize understanding of: fall prevention strategies within home environment. Target date: 12/28/13.   Time --  12/28/13   Status On-going   PT LONG TERM GOAL #2   Title improve BERG score to 38/56 to decrease  risk of falls. Target date: 12/28/13.   Time --  12/28/13   Status Achieved   PT LONG TERM GOAL #3   Title perform TUG in < 50 sec., with LRAD, to decrease falls risk. Target date: 12/28/13.   Time --  12/28/13   Status Achieved   PT LONG TERM GOAL #4   Title Pt will increase gait speed to >/=2.525fsec. to be a safe coHydrographic surveyorTarget date: 12/28/13.   Time --  12/28/13   Status On-going   PT LONG TERM GOAL #5   Title ambulate 300' over even/uneven terrain, with LRAD and supervision, to decrease falls risk. Target date: 12/28/13.   Time --  12/28/13   Status On-going   PT LONG TERM GOAL #6   Title will increase FOTO functional status by 10 points.   Time --  12/28/13   Status On-going   PT LONG TERM GOAL #7   Title Pt will perform TUG in < 37 sec., with LRAD, to decrease falls risk. Target date: 12/28/13.   Status On-going   PT LONG TERM GOAL #8   Title Pt will improve BERG score to 41/56 to decrease risk of falls. Target date: 12/28/13.   Status On-going          Plan - 12/27/13 0854    Clinical Impression Statement Pt demonstrated progress as he performed sit<>stands at MOD I level and required less VC's to improve stride length during first 100' of ambulation. Pt continues to decr. stride length after ambulating longer distances due to fatigue. Pt would continue to benefit from skilled PT to improve safety during functional mobility.   Pt will benefit from skilled therapeutic intervention in order to improve on the following deficits Abnormal gait;Decreased balance;Decreased knowledge of use of DME;Decreased mobility;Decreased strength;Decreased endurance;Impaired flexibility   Rehab Potential Good   PT Frequency 2x / week   PT Duration 8 weeks   PT Treatment/Interventions ADLs/Self Care Home Management;Gait training;Neuromuscular re-education;Stair training;Functional mobility training;Patient/family education;Therapeutic activities;Therapeutic exercise;DME  Instruction;Balance training   PT Next Visit Plan Check LTGs, and g-code for last visit.   Consulted and Agree with Plan of Care Patient                               Problem List Patient Active Problem List   Diagnosis Date Noted  . Heel spur 05/04/2013  . Weakness 04/17/2013  . Anemia of other chronic disease 06/10/2012  . Essential hypertension, benign 06/03/2012  . Type II or unspecified type diabetes mellitus with neurological manifestations, not stated as uncontrolled 06/03/2012  . Abnormality of gait 05/28/2012  . Type I (juvenile type) diabetes mellitus with peripheral circulatory disorders, not stated as uncontrolled(250.71) 05/28/2012  . Unspecified hereditary and idiopathic peripheral neuropathy 05/28/2012  . Unspecified late effects of cerebrovascular disease  05/28/2012  . Weakness generalized 05/11/2012  . Blind left eye 12/11/2011  . Neuropathy 12/11/2011  . Elevated LFTs 11/12/2011  . Diabetes mellitus 03/26/2011  . Hyperlipidemia 03/26/2011  . CVA (cerebral infarction) 03/26/2011  . Gait disorder 03/26/2011    Miller,Jennifer L 12/27/2013, 8:58 AM     Geoffry Paradise, PT,DPT 12/27/2013 8:58 AM Phone: [phone] Fax: [phone]

## 2013-12-28 ENCOUNTER — Ambulatory Visit (INDEPENDENT_AMBULATORY_CARE_PROVIDER_SITE_OTHER): Payer: Medicare Other

## 2013-12-28 ENCOUNTER — Ambulatory Visit (INDEPENDENT_AMBULATORY_CARE_PROVIDER_SITE_OTHER): Payer: Medicare Other | Admitting: Emergency Medicine

## 2013-12-28 VITALS — BP 143/73 | HR 103 | Temp 97.8°F | Resp 16 | Ht 63.0 in | Wt 221.0 lb

## 2013-12-28 DIAGNOSIS — I10 Essential (primary) hypertension: Secondary | ICD-10-CM

## 2013-12-28 DIAGNOSIS — M25561 Pain in right knee: Secondary | ICD-10-CM

## 2013-12-28 DIAGNOSIS — R739 Hyperglycemia, unspecified: Secondary | ICD-10-CM

## 2013-12-28 LAB — COMPLETE METABOLIC PANEL WITH GFR
ALBUMIN: 3.6 g/dL (ref 3.5–5.2)
ALT: 9 U/L (ref 0–53)
AST: 15 U/L (ref 0–37)
Alkaline Phosphatase: 58 U/L (ref 39–117)
BUN: 20 mg/dL (ref 6–23)
CO2: 29 mEq/L (ref 19–32)
Calcium: 8.9 mg/dL (ref 8.4–10.5)
Chloride: 105 mEq/L (ref 96–112)
Creat: 1.14 mg/dL (ref 0.50–1.35)
GFR, EST AFRICAN AMERICAN: 67 mL/min
GFR, EST NON AFRICAN AMERICAN: 58 mL/min — AB
GLUCOSE: 110 mg/dL — AB (ref 70–99)
POTASSIUM: 4.7 meq/L (ref 3.5–5.3)
Sodium: 141 mEq/L (ref 135–145)
Total Bilirubin: 0.4 mg/dL (ref 0.2–1.2)
Total Protein: 6.2 g/dL (ref 6.0–8.3)

## 2013-12-28 LAB — CBC WITH DIFFERENTIAL/PLATELET
Basophils Absolute: 0 10*3/uL (ref 0.0–0.1)
Basophils Relative: 0 % (ref 0–1)
EOS ABS: 0.2 10*3/uL (ref 0.0–0.7)
Eosinophils Relative: 4 % (ref 0–5)
HCT: 34.1 % — ABNORMAL LOW (ref 39.0–52.0)
Hemoglobin: 11.6 g/dL — ABNORMAL LOW (ref 13.0–17.0)
Lymphocytes Relative: 44 % (ref 12–46)
Lymphs Abs: 2.1 10*3/uL (ref 0.7–4.0)
MCH: 26.8 pg (ref 26.0–34.0)
MCHC: 34 g/dL (ref 30.0–36.0)
MCV: 78.8 fL (ref 78.0–100.0)
MONO ABS: 0.3 10*3/uL (ref 0.1–1.0)
MPV: 10.6 fL (ref 9.4–12.4)
Monocytes Relative: 7 % (ref 3–12)
NEUTROS ABS: 2.1 10*3/uL (ref 1.7–7.7)
Neutrophils Relative %: 45 % (ref 43–77)
Platelets: 232 10*3/uL (ref 150–400)
RBC: 4.33 MIL/uL (ref 4.22–5.81)
RDW: 14 % (ref 11.5–15.5)
WBC: 4.7 10*3/uL (ref 4.0–10.5)

## 2013-12-28 LAB — URIC ACID: Uric Acid, Serum: 7.2 mg/dL (ref 4.0–7.8)

## 2013-12-28 LAB — GLUCOSE, POCT (MANUAL RESULT ENTRY): POC Glucose: 121 mg/dl — AB (ref 70–99)

## 2013-12-28 NOTE — Progress Notes (Addendum)
Subjective:  This chart was scribed for Nena Jordan, MD by Dellis Filbert, ED Scribe at [facility].The patient was seen in exam room 21 and the patient's care was started at 10:50 AM.   Patient ID: Curtis Brock, male    DOB: 12/31/1925, 80 y.o.   MRN: [id number] Chief Complaint  Patient presents with  . Follow-up  . Diabetes   HPI HPI Comments: Curtis Brock is a 80 y.o. male who presents to [facility] for a follow-up. Pt recently began physical therapy, his daughter states he is walking slower than before his physical therapy. He is starting occupational therapy on Monday. Pt complains about right knee pain, he has not fallen and states it started hurting when he began physical therapy. He denies CP, SOB. He does not have an orthopedist. He sees his ophthalmologist  once a year, and there is no improvement in his sight. He is utd on his immunizations  Patient Active Problem List   Diagnosis Date Noted  . Heel spur 05/04/2013  . Weakness 04/17/2013  . Anemia of other chronic disease 06/10/2012  . Essential hypertension, benign 06/03/2012  . Type II or unspecified type diabetes mellitus with neurological manifestations, not stated as uncontrolled 06/03/2012  . Abnormality of gait 05/28/2012  . Type I (juvenile type) diabetes mellitus with peripheral circulatory disorders, not stated as uncontrolled(250.71) 05/28/2012  . Unspecified hereditary and idiopathic peripheral neuropathy 05/28/2012  . Unspecified late effects of cerebrovascular disease 05/28/2012  . Weakness generalized 05/11/2012  . Blind left eye 12/11/2011  . Neuropathy 12/11/2011  . Elevated LFTs 11/12/2011  . Diabetes mellitus 03/26/2011  . Hyperlipidemia 03/26/2011  . CVA (cerebral infarction) 03/26/2011  . Gait disorder 03/26/2011   Past Medical History  Diagnosis Date  . Diabetes mellitus without complication   . Hypertension    No past surgical history on file. No Known Allergies Prior to Admission  medications   Medication Sig Start Date End Date Taking? Authorizing Provider  acetaminophen (TYLENOL) 325 MG tablet Take 2 tablets (650 mg total) by mouth every 6 (six) hours as needed for mild pain or moderate pain (or Fever >/= 101). 04/20/13  Yes Sheila Oats, MD  aspirin EC 81 MG tablet Take 81 mg by mouth daily.   Yes Historical Provider, MD  cyanocobalamin (,VITAMIN B-12,) 1000 MCG/ML injection INJECT 1 ML AS DIRECTED EVERY MONTH 04/05/13  Yes Darlyne Russian, MD  furosemide (LASIX) 20 MG tablet TAKE 1 TABLET (20 MG TOTAL) BY MOUTH DAILY. 11/15/13  Yes Darlyne Russian, MD  losartan (COZAAR) 50 MG tablet TAKE ONE TABLET BY MOUTH ONCE DAILY 08/05/13  Yes Darlyne Russian, MD  zoster vaccine live, PF, (ZOSTAVAX) 57846 UNT/0.65ML injection Inject 19,400 Units into the skin once. 07/01/13  Yes Darlyne Russian, MD  docusate sodium 100 MG CAPS Take 100 mg by mouth 2 (two) times daily. Patient not taking: Reported on 12/28/2013 04/20/13   Sheila Oats, MD   History   Social History  . Marital Status: Widowed    Spouse Name: N/A    Number of Children: N/A  . Years of Education: N/A   Occupational History  . Not on file.   Social History Main Topics  . Smoking status: Never Smoker   . Smokeless tobacco: Not on file  . Alcohol Use: No  . Drug Use: No  . Sexual Activity: No   Other Topics Concern  . Not on file   Social  History Narrative   Review of Systems  Respiratory: Negative for shortness of breath.   Cardiovascular: Negative for chest pain.  Musculoskeletal: Positive for arthralgias and gait problem.      Objective:  BP 143/73 mmHg  Pulse 103  Temp(Src) 97.8 F (36.6 C)  Resp 16  Ht 5\' 3"  (1.6 m)  Wt 221 lb (100.245 kg)  BMI 39.16 kg/m2  SpO2 99%  Physical Exam  Constitutional: He is oriented to person, place, and time. He appears well-developed and well-nourished.  HENT:  Head: Normocephalic and atraumatic.  Blind in left eye  Eyes: EOM are normal.  Neck: Normal range  of motion.  Cardiovascular: Normal rate, regular rhythm and normal heart sounds.   Pulmonary/Chest: Effort normal and breath sounds normal.  Musculoskeletal: Normal range of motion.  Significant degenerative changes to the right knee with limited flexion and extension.   Neurological: He is alert and oriented to person, place, and time.  Skin: Skin is warm and dry.  Psychiatric: He has a normal mood and affect. His behavior is normal.  Nursing note and vitals reviewed. [facility] reading (PRIMARY) by  Dr.Daub there are severe degenerative changes involving the right knee. There is calcification about the patella and calcification in the quad muscle. Results for orders placed or performed in visit on 10/05/13  POCT glucose (manual entry)  Result Value Ref Range   POC Glucose 140 (A) 70 - 99 mg/dl  POCT glycosylated hemoglobin (Hb A1C)  Result Value Ref Range   Hemoglobin A1C 6.6   Glucose 120    Assessment & Plan:  A1c was not done today. His other blood work was done today. 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 He will take Tylenol for the pain. We'll recheck in 3 months.I personally performed the services described in this documentation, which was scribed in my presence. The recorded information has been reviewed and is accurate.

## 2013-12-31 ENCOUNTER — Ambulatory Visit: Payer: Medicare Other

## 2013-12-31 DIAGNOSIS — R5381 Other malaise: Secondary | ICD-10-CM

## 2013-12-31 DIAGNOSIS — R531 Weakness: Secondary | ICD-10-CM

## 2013-12-31 DIAGNOSIS — R262 Difficulty in walking, not elsewhere classified: Secondary | ICD-10-CM

## 2013-12-31 DIAGNOSIS — I639 Cerebral infarction, unspecified: Secondary | ICD-10-CM | POA: Diagnosis not present

## 2013-12-31 NOTE — Patient Instructions (Addendum)

## 2013-12-31 NOTE — Therapy (Signed)
[facility] [address] [place], [place], [address] Phone: [phone]   Fax:  [phone]  Physical Therapy Treatment  Patient Details  Name: Curtis Brock MRN: [id number] Date of Birth: Feb 12, 1925  Encounter Date: 12/31/2013      PT End of Session - 12/31/13 1510    Visit Number 15   Number of Visits 16   Date for PT Re-Evaluation 12/28/13   Authorization Type U[employer] Medicare   Authorization Time Period 11/02/13-12/31/13   PT Start Time 1006   PT Stop Time 1050   PT Time Calculation (min) 44 min   Equipment Utilized During Treatment Gait belt   Activity Tolerance Patient tolerated treatment well   Behavior During Therapy [facility] for tasks assessed/performed      Past Medical History  Diagnosis Date  . Diabetes mellitus without complication   . Hypertension     History reviewed. No pertinent past surgical history.  There were no vitals taken for this visit.  Visit Diagnosis:  Difficult walk-unspec  Weakness generalized  Debility      Subjective Assessment - 12/31/13 1009    Symptoms Pt denied any changes or falls since last visit. "I feel alright".    Currently in Pain? No/denies                    [facility] Adult PT Treatment/Exercise - 12/31/13 1010    Ambulation/Gait   Ambulation/Gait Yes   Ambulation/Gait Assistance 5: Supervision   Ambulation/Gait Assistance Details Pt ambulated over even/uneven terrain without any LOB. Supervision for safety and cues for upright posture and to improve stride length.   Ambulation Distance (Feet) --  75'x2, 400', 50'   Assistive device Rollator   Gait Pattern Step-to pattern;Decreased step length - right;Decreased step length - left;Decreased dorsiflexion - right;Decreased dorsiflexion - left;Shuffle   Gait velocity 2.7f/sec   Standardized Balance Assessment   Standardized Balance Assessment Berg Balance Test;Timed Up and Go Test   Berg Balance Test   Sit to Stand Able to stand without using hands and stabilize independently   Standing Unsupported Able to stand safely 2 minutes   Sitting with Back Unsupported but Feet Supported on Floor or Stool Able to sit safely and securely 2 minutes   Stand to Sit Sits safely with minimal use of hands   Transfers Able to transfer safely, minor use of hands   Standing Unsupported with Eyes Closed Able to stand 10 seconds safely   Standing Ubsupported with Feet Together Able to place feet together independently and stand 1 minute safely   From Standing, Reach Forward with Outstretched Arm Can reach forward >5 cm safely (2")  7"   From Standing Position, Pick up Object from Floor Able to pick up shoe, needs supervision   From Standing Position, Turn to Look Behind Over each Shoulder Looks behind one side only/other side shows less weight shift   Turn 360 Degrees Able to turn 360 degrees safely but slowly   Standing Unsupported, Alternately Place Feet on Step/Stool Able to complete >2 steps/needs minimal assist   Standing Unsupported, One Foot in Front Able to plae foot ahead of the other independently and hold 30 seconds   Standing on One Leg Tries to lift leg/unable to hold 3 seconds but remains standing independently   Total Score 43   Timed Up and Go Test   TUG Normal TUG   Normal TUG (seconds) 35.49  with rollator  PT Education - 05-Jan-2014 1058    Education provided Yes   Education Details Falls prevention handout. PT also assisted pt in filling out FOTO survey at end of session as pt had difficulty reading questions. Pt reported he has been performing HEP and walking as prescribed and PT reiterated the importance of continuing HEP after PT d/c.   Person(s) Educated Patient   Methods Explanation   Comprehension Verbalized understanding          PT Short Term Goals - 01/05/14 1511    PT SHORT TERM GOAL #1   Title Independent in performing HEP to improve safety and  balance at home. Target date: 12/28/13.   Baseline achieved January 05, 2014.   Status Achieved   PT SHORT TERM GOAL #2   Title Increase BERG balance score to decrease falls risk.   Status Achieved   PT SHORT TERM GOAL #3   Title perform TUG in < 60 seconds, with LRAD, to decrease falls risk.   Status Achieved   PT SHORT TERM GOAL #4   Title increase gait speed to >/=2.36f/sec. to decrease falls risk.   Baseline achieved 112/23/2015  Status Achieved   PT SHORT TERM GOAL #5   Title ambulate 150' over even/uneven terrain, with LRAD and supervision, to decrease falls risk.   Status Achieved           PT Long Term Goals - 12015-12-231512    PT LONG TERM GOAL #1   Title verbalize understanding of: fall prevention strategies within home environment. Target date: 12/28/13.   Time --  12/28/13   Status Achieved   PT LONG TERM GOAL #2   Title improve BERG score to 38/56 to decrease risk of falls. Target date: 12/28/13.   Time --  12/28/13   Status Achieved   PT LONG TERM GOAL #3   Title perform TUG in < 50 sec., with LRAD, to decrease falls risk. Target date: 12/28/13.   Time --  12/28/13   Status Achieved   PT LONG TERM GOAL #4   Title Pt will increase gait speed to >/=2.564fsec. to be a safe coHydrographic surveyorTarget date: 12/28/13.   Time --  12/28/13   Status Achieved   PT LONG TERM GOAL #5   Title ambulate 300' over even/uneven terrain, with LRAD and supervision, to decrease falls risk. Target date: 12/28/13.   Time --  12/28/13   Status Achieved   PT LONG TERM GOAL #6   Title will increase FOTO functional status by 10 points.   Time --  12/28/13   Status Achieved   PT LONG TERM GOAL #7   Title Pt will perform TUG in < 37 sec., with LRAD, to decrease falls risk. Target date: 12/28/13.   Status Achieved   PT LONG TERM GOAL #8   Title Pt will improve BERG score to 41/56 to decrease risk of falls. Target date: 12/28/13.   Status Achieved               Plan -  [id number]    Clinical Impression Statement Pt met 8/8 LTGs, and HEP STG. See discharge summary.          G-Codes - 122015-12-23512    Functional Assessment Tool Used BERG: 43/56; Gait velocity: 2.88f64fec. with rollator; TUG with rollator: 35.49sec.   Functional Limitation Mobility: Walking and moving around   Mobility: Walking and Moving Around Goal Status ([id number] least 20 percent but less than 40 percent impaired,  limited or restricted   Mobility: Walking and Moving Around Discharge Status [id number]) At least 20 percent but less than 40 percent impaired, limited or restricted      Problem List Patient Active Problem List   Diagnosis Date Noted  . Heel spur 05/04/2013  . Weakness 04/17/2013  . Anemia of other chronic disease 06/10/2012  . Essential hypertension, benign 06/03/2012  . Type II or unspecified type diabetes mellitus with neurological manifestations, not stated as uncontrolled 06/03/2012  . Abnormality of gait 05/28/2012  . Type I (juvenile type) diabetes mellitus with peripheral circulatory disorders, not stated as uncontrolled(250.71) 05/28/2012  . Unspecified hereditary and idiopathic peripheral neuropathy 05/28/2012  . Unspecified late effects of cerebrovascular disease 05/28/2012  . Weakness generalized 05/11/2012  . Blind left eye 12/11/2011  . Neuropathy 12/11/2011  . Elevated LFTs 11/12/2011  . Diabetes mellitus 03/26/2011  . Hyperlipidemia 03/26/2011  . CVA (cerebral infarction) 03/26/2011  . Gait disorder 03/26/2011    Donzel Romack L 12/31/2013, 3:14 PM  [facility] [address] [place], [place], [address] Phone: [phone]   Fax:  [phone]  PHYSICAL THERAPY DISCHARGE SUMMARY  Visits from Start of Care: 15  Current functional level related to goals / functional outcomes:     PT Long Term Goals - 12/31/13 1512    PT LONG TERM GOAL #1   Title verbalize understanding of: fall  prevention strategies within home environment. Target date: 12/28/13.   Time --  12/28/13   Status Achieved   PT LONG TERM GOAL #2   Title improve BERG score to 38/56 to decrease risk of falls. Target date: 12/28/13.   Time --  12/28/13   Status Achieved   PT LONG TERM GOAL #3   Title perform TUG in < 50 sec., with LRAD, to decrease falls risk. Target date: 12/28/13.   Time --  12/28/13   Status Achieved   PT LONG TERM GOAL #4   Title Pt will increase gait speed to >/=2.40f/sec. 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   Time --  12/28/13   Status Achieved   PT LONG TERM GOAL #5   Title ambulate 300' over even/uneven terrain, with LRAD and supervision, to decrease falls risk. Target date: 12/28/13.   Time --  12/28/13   Status Achieved   PT LONG TERM GOAL #6   Title will increase FOTO functional status by 10 points.   Time --  12/28/13   Status Achieved   PT LONG TERM GOAL #7   Title Pt will perform TUG in < 37 sec., with LRAD, to decrease falls risk. Target date: 12/28/13.   Status Achieved   PT LONG TERM GOAL #8   Title Pt will improve BERG score to 41/56 to decrease risk of falls. Target date: 12/28/13.   Status Achieved       Remaining deficits: Pt continues to ambulate at a slower pace,which has improved since initial eval date. Pt demonstrated improvements in stride length, balance, and endurance.   Education / Equipment: HEP and falls prevention strategies handout  Plan: Patient agrees to discharge.  Patient goals were met. Patient is being discharged due to meeting the stated rehab goals.  ?????        JGeoffry Paradise PT,DPT 12/31/2013 3:14 PM Phone: [phone]: [phone]

## 2014-01-03 ENCOUNTER — Ambulatory Visit: Payer: Medicare Other | Admitting: Occupational Therapy

## 2014-01-03 ENCOUNTER — Ambulatory Visit: Payer: Medicare Other

## 2014-01-03 ENCOUNTER — Encounter: Payer: Self-pay | Admitting: Occupational Therapy

## 2014-01-03 DIAGNOSIS — R279 Unspecified lack of coordination: Secondary | ICD-10-CM

## 2014-01-03 DIAGNOSIS — I639 Cerebral infarction, unspecified: Secondary | ICD-10-CM | POA: Diagnosis not present

## 2014-01-03 DIAGNOSIS — R278 Other lack of coordination: Secondary | ICD-10-CM

## 2014-01-03 DIAGNOSIS — Z7409 Other reduced mobility: Secondary | ICD-10-CM

## 2014-01-03 DIAGNOSIS — M6281 Muscle weakness (generalized): Secondary | ICD-10-CM

## 2014-01-03 NOTE — Therapy (Signed)
[facility] [address] [place], [place], [address] Phone: [phone]   Fax:  [phone]  Occupational Therapy Treatment  Patient Details  Name: Curtis Brock MRN: [id number] Date of Birth: October 04, 1925  Encounter Date: 01/03/2014      OT End of Session - 01/03/14 0855    Visit Number 2   Number of Visits 16  2/10 G   Date for OT Re-Evaluation 02/21/14   Authorization Type UHC MCR - G Code needed!   OT Start Time 0800   OT Stop Time 0844   OT Time Calculation (min) 44 min   Activity Tolerance Patient tolerated treatment well;Patient limited by fatigue      Past Medical History  Diagnosis Date  . Diabetes mellitus without complication   . Hypertension     History reviewed. No pertinent past surgical history.  There were no vitals taken for this visit.  Visit Diagnosis:  Generalized muscle weakness  Decreased coordination  Impaired functional mobility, balance, and endurance      Subjective Assessment - 01/03/14 0813    Currently in Pain? Yes   Pain Location Knee   Pain Orientation Right   Pain Descriptors / Indicators Aching   Pain Type Chronic pain   Pain Onset More than a month ago   Pain Frequency Constant   Aggravating Factors  nothing   Pain Relieving Factors tylenol and gold bond cream                 OT Treatments/Exercises (OP) - 01/03/14 0815    Exercises   Exercises Shoulder   Shoulder Exercises: ROM/Strengthening   UBE (Upper Arm Bike) x 8 min. level 1 for strength/endurance   Other ROM/Strengthening Exercises Cane ex's in bilateral shoulder flexion, abduction, extension and ER  x 10 reps each   Other ROM/Strengthening Exercises Sit to stand x 5 reps each                OT Education - 01/03/14 0837    Education provided Yes   Education Details Cane HEP for bilateral UE's, sit to stand ex   Person(s) Educated Patient;Child(ren)   Methods  Explanation;Demonstration;Handout   Comprehension Verbalized understanding;Returned demonstration          OT Short Term Goals - 01/03/14 0857    OT SHORT TERM GOAL #1   Title Pt will be Mod I home program for AROM UE's (due 01/21/14/)   Time 4   Period Weeks   Status On-going   OT SHORT TERM GOAL #2   Title Pt will be Mod I HEP for coordination   Time 4   Period Weeks   Status On-going   OT SHORT TERM GOAL #3   Title Pt will be Supervision level LB dressing techniques using A/E PRN seated and sit to standing.   Time 4   Period Weeks   Status On-going           OT Long Term Goals - 12/24/13 1238    OT LONG TERM GOAL #1   Title Pt will be Mod I updated HEP   Time 8   Period Weeks   Status New   OT LONG TERM GOAL #2   Title Pt will demonstrate increased bilateral UE coordination as seen by improved box and blocks scores by 5 or more blocks each hand.   Time 8   Period Weeks   Status New   OT LONG TERM GOAL #3  Title Pt will be Min A donning jacket/shirt seated using hemi-dressing type techniques.   Time 8   Period Weeks   Status New   OT LONG TERM GOAL #4   Title Pt will be Mod I toileting and clothing management using a/e PRN   Time 8   Period Weeks   Status New   OT LONG TERM GOAL #5   Title Pt will be supervision level tub  transfer & LB ADL's using A/E PRN    Time 8   Period Weeks   Status New               Plan - 01/03/14 [id number]    Clinical Impression Statement Pt w/ generalized deconditioning and overall decreased ROM, strength and endurance. Pt tolerating cane ex's at mid range (due to decreased sh. ROM - ? secondary to OA)   Plan Coordination HEP, Review cane HEP (STG's due 01/21/14)        Problem List Patient Active Problem List   Diagnosis Date Noted  . Heel spur 05/04/2013  . Weakness 04/17/2013  . Anemia of other chronic disease 06/10/2012  . Essential hypertension, benign 06/03/2012  . Type II or unspecified type diabetes  mellitus with neurological manifestations, not stated as uncontrolled 06/03/2012  . Abnormality of gait 05/28/2012  . Type I (juvenile type) diabetes mellitus with peripheral circulatory disorders, not stated as uncontrolled(250.71) 05/28/2012  . Unspecified hereditary and idiopathic peripheral neuropathy 05/28/2012  . Unspecified late effects of cerebrovascular disease 05/28/2012  . Weakness generalized 05/11/2012  . Blind left eye 12/11/2011  . Neuropathy 12/11/2011  . Elevated LFTs 11/12/2011  . Diabetes mellitus 03/26/2011  . Hyperlipidemia 03/26/2011  . CVA (cerebral infarction) 03/26/2011  . Gait disorder 03/26/2011    Carey Bullocks 01/03/2014, 9:00 AM  [facility] [address] [place], [place], [address] Phone: [phone]   Fax:  [place], OTR/L 01/03/2014 9:01 AM Phone [phone] FAX [phone]

## 2014-01-03 NOTE — Patient Instructions (Signed)
   Lie on back holding wand. Raise arms over head. Hold 5sec. Repeat 10 times per set.  Do 2-3 sessions per day.   ROM: Abduction - Wand   Holding wand with left hand palm up, push wand directly out to side, leading with other hand palm down, until stretch is felt. Hold 5 seconds. Repeat 10 times per set. Do 2-3 sessions per day. (Lying down)   ROM: Extension - Wand (Standing)   Stand holding wand behind back. Raise arms as far as possible. Repeat 10 times per set.  Do 2-3 sessions per day.   Press-Up With Wand   Press wand up until elbows are straight, then reach wand over head to a pain free range. Hold 5 seconds. Repeat 10 times. Do 2-3 sessions per day.    - Standing: Place cane behind back (palms facing back). Lift cane up back by bending elbows x 10 reps, 2-3 sessions per day.    - Sit to stand 5-10 reps, 2-3 times per day, from chair with armrests.

## 2014-01-06 ENCOUNTER — Encounter: Payer: Medicare Other | Admitting: Occupational Therapy

## 2014-01-10 ENCOUNTER — Encounter: Payer: Self-pay | Admitting: Occupational Therapy

## 2014-01-10 ENCOUNTER — Ambulatory Visit: Payer: Medicare Other | Admitting: Occupational Therapy

## 2014-01-10 DIAGNOSIS — R279 Unspecified lack of coordination: Secondary | ICD-10-CM

## 2014-01-10 DIAGNOSIS — M6281 Muscle weakness (generalized): Secondary | ICD-10-CM

## 2014-01-10 DIAGNOSIS — I639 Cerebral infarction, unspecified: Secondary | ICD-10-CM | POA: Diagnosis not present

## 2014-01-10 DIAGNOSIS — R278 Other lack of coordination: Secondary | ICD-10-CM

## 2014-01-10 DIAGNOSIS — Z7409 Other reduced mobility: Secondary | ICD-10-CM

## 2014-01-10 NOTE — Patient Instructions (Signed)
  Coordination Activities  Perform the following activities for 15-20 minutes 1-2 times per day with both hand(s).   Rotate ball in fingertips (clockwise and counter-clockwise).  Flip cards 1 at a time as fast as you can.  Deal cards with your thumb (Hold deck in hand and push card off top with thumb).  Pick up coins, buttons, marbles, dried beans/pasta of different sizes and place in container.  Pick up coins and place in container or coin bank.  Pick up coins and stack.  Screw together nuts and bolts, then unfasten.   Can also practice picking up paper clips, small nuts/bolts

## 2014-01-10 NOTE — Therapy (Signed)
[facility] [address] [place], [place], [address] Phone: [phone]   Fax:  [phone]  Occupational Therapy Treatment  Patient Details  Name: Curtis Brock MRN: [id number] Date of Birth: 29-Aug-1925  Encounter Date: 01/10/2014      OT End of Session - 01/10/14 1009    Visit Number 3  3/10 G   Number of Visits 16   Date for OT Re-Evaluation 02/21/14   Authorization Type UHC MCR - G Code needed!   OT Start Time [phone]   OT Stop Time 1010   OT Time Calculation (min) 42 min   Activity Tolerance Patient tolerated treatment well      Past Medical History  Diagnosis Date  . Diabetes mellitus without complication   . Hypertension     History reviewed. No pertinent past surgical history.  There were no vitals taken for this visit.  Visit Diagnosis:  Generalized muscle weakness  Decreased coordination  Impaired functional mobility, balance, and endurance      Subjective Assessment - 01/10/14 0933    Symptoms "I got a shot in my Rt knee and I don't have any pain now"   Currently in Pain? No/denies                 OT Treatments/Exercises (OP) - 01/10/14 0935    Shoulder Exercises: ROM/Strengthening   Other ROM/Strengthening Exercises Reviewed cane ex's: shoulder flexion and abduction (seated) however instructed pt he can also do shoulder flexion supine. Shoulder extension (palms forward), and shoulder IR (palms back) in standing w/ supervision x 10 reps bilaterally   Fine Motor Coordination   Fine Motor Coordination In hand manipuation training   In Hand Manipulation Training Pt performing coordination activities bilateral hands: issued HEP (see pt instructions). Pt return demo bilaterally w/ max difficulty                OT Education - 01/10/14 1005    Education provided Yes   Education Details coordination activities HEP  (also reviewed cane ex HEP)   Person(s) Educated Patient   Methods Explanation;Demonstration;Handout   Comprehension Verbalized understanding;Returned demonstration          OT Short Term Goals - 01/03/14 0857    OT SHORT TERM GOAL #1   Title Pt will be Mod I home program for AROM UE's (due 01/21/14/)   Time 4   Period Weeks   Status On-going   OT SHORT TERM GOAL #2   Title Pt will be Mod I HEP for coordination   Time 4   Period Weeks   Status On-going   OT SHORT TERM GOAL #3   Title Pt will be Supervision level LB dressing techniques using A/E PRN seated and sit to standing.   Time 4   Period Weeks   Status On-going           OT Long Term Goals - 12/24/13 1238    OT LONG TERM GOAL #1   Title Pt will be Mod I updated HEP   Time 8   Period Weeks   Status New   OT LONG TERM GOAL #2   Title Pt will demonstrate increased bilateral UE coordination as seen by improved box and blocks scores by 5 or more blocks each hand.   Time 8   Period Weeks   Status New   OT LONG TERM GOAL #3   Title Pt will be Min A donning jacket/shirt seated using hemi-dressing type techniques.  Time 8   Period Weeks   Status New   OT LONG TERM GOAL #4   Title Pt will be Mod I toileting and clothing management using a/e PRN   Time 8   Period Weeks   Status New   OT LONG TERM GOAL #5   Title Pt will be supervision level tub  transfer & LB ADL's using A/E PRN    Time 8   Period Weeks   Status New               Plan - 01/10/14 1010    Clinical Impression Statement Pt w/ generalized deconditioning and overall decreased ROM, strength and endurance.    Plan ADL's (dressing) and simulated toileting, review coordination HEP prn        Problem List Patient Active Problem List   Diagnosis Date Noted  . Heel spur 05/04/2013  . Weakness 04/17/2013  . Anemia of other chronic disease 06/10/2012  . Essential hypertension, benign 06/03/2012  . Type II or unspecified type diabetes mellitus with neurological manifestations, not stated as  uncontrolled 06/03/2012  . Abnormality of gait 05/28/2012  . Type I (juvenile type) diabetes mellitus with peripheral circulatory disorders, not stated as uncontrolled(250.71) 05/28/2012  . Unspecified hereditary and idiopathic peripheral neuropathy 05/28/2012  . Unspecified late effects of cerebrovascular disease 05/28/2012  . Weakness generalized 05/11/2012  . Blind left eye 12/11/2011  . Neuropathy 12/11/2011  . Elevated LFTs 11/12/2011  . Diabetes mellitus 03/26/2011  . Hyperlipidemia 03/26/2011  . CVA (cerebral infarction) 03/26/2011  . Gait disorder 03/26/2011    Carey Bullocks, OTR/L 01/10/2014, 10:16 AM  [facility] [address] [place], [place], [address] Phone: [phone]   Fax:  [phone]

## 2014-01-13 ENCOUNTER — Encounter: Payer: Self-pay | Admitting: Occupational Therapy

## 2014-01-13 ENCOUNTER — Ambulatory Visit: Payer: Medicare Other | Admitting: Occupational Therapy

## 2014-01-13 DIAGNOSIS — I639 Cerebral infarction, unspecified: Secondary | ICD-10-CM | POA: Diagnosis not present

## 2014-01-13 DIAGNOSIS — Z7409 Other reduced mobility: Secondary | ICD-10-CM

## 2014-01-13 DIAGNOSIS — M6281 Muscle weakness (generalized): Secondary | ICD-10-CM

## 2014-01-13 DIAGNOSIS — R278 Other lack of coordination: Secondary | ICD-10-CM

## 2014-01-13 DIAGNOSIS — R279 Unspecified lack of coordination: Secondary | ICD-10-CM

## 2014-01-13 DIAGNOSIS — R5381 Other malaise: Secondary | ICD-10-CM

## 2014-01-13 NOTE — Therapy (Signed)
[facility] [address] [place], [place], [address] Phone: [phone]   Fax:  [phone]  Occupational Therapy Treatment  Patient Details  Name: Curtis Brock MRN: [id number] Date of Birth: [birth year]  Encounter Date: 01/13/2014      OT End of Session - 01/13/14 1210    Visit Number 4  4/10 G   Number of Visits 16   Date for OT Re-Evaluation 02/21/14   Authorization Type U[employer] MCR - G Code needed!   Authorization Time Period week 2/8   OT Start Time 1015   OT Stop Time 1105   OT Time Calculation (min) 50 min   Activity Tolerance Patient tolerated treatment well      Past Medical History  Diagnosis Date  . Diabetes mellitus without complication   . Hypertension     History reviewed. No pertinent past surgical history.  There were no vitals taken for this visit.  Visit Diagnosis:  Generalized muscle weakness  Decreased coordination  Impaired functional mobility, balance, and endurance  Debility      Subjective Assessment - 01/13/14 1021    Symptoms I'm ok   Currently in Pain? No/denies                 OT Treatments/Exercises (OP) - 01/13/14 1100    ADLs   UB Dressing Pt doffing jacket w/ adaptations independently. Pt initially required min assist to don jacket but implemented strategy to don through Lt weaker arm, then overhead, then Rt arm at mod I level. Pt dependent for initiating zipper.    LB Dressing Pt doffed shoes I'ly. Pt doffed socks using reacher and min cues. Pt stood to doff pants but needed assist to unhook belt and button of pants, then pt could doff pants w/ supervison. Pt required mod cueing to don pants over feet using reacher, then close sup to stand and don over hips. Dependent for buttoning pants, but could zip and initiate belt. Pt required max assist to don socks (attempted sock aid, but unable to get slide foot in sock aid due to edema), but pt reports he can don socks  at home using a fork??? Pt able to don Lt shoe, but therapist had to don Rt shoe due to time constraints                  OT Short Term Goals - 01/13/14 1211    OT SHORT TERM GOAL #1   Title Pt will be Mod I home program for AROM UE's (due 01/21/14/)   Status Achieved   OT SHORT TERM GOAL #2   Title Pt will be Mod I HEP for coordination   Status Achieved   OT SHORT TERM GOAL #3   Title Pt will be Supervision level LB dressing techniques using A/E PRN seated and sit to standing.   Status On-going  Pt required min assist overall as of 01/13/14           OT Long Term Goals - 01/13/14 1212    OT LONG TERM GOAL #1   Title Pt will be Mod I updated HEP   Time 8   Period Weeks   Status New   OT LONG TERM GOAL #2   Title Pt will demonstrate increased bilateral UE coordination as seen by improved box and blocks scores by 5 or more blocks each hand.   Time 8   Period Weeks   Status New   OT LONG TERM  GOAL #3   Title Pt will be Min A donning jacket/shirt seated using hemi-dressing type techniques.   Time 8   Period Weeks   Status Achieved  met 01/13/14 w/ compensations, except starting zipper   OT LONG TERM GOAL #4   Title Pt will be Mod I toileting and clothing management using a/e PRN   Time 8   Period Weeks   Status New   OT LONG TERM GOAL #5   Title Pt will be supervision level tub  transfer & LB ADL's using A/E PRN    Time 8   Period Weeks   Status New               Plan - 01/13/14 1213    Clinical Impression Statement Pt met STG's #1, 2 and LTG #3. Pt progressing w/ ADLS   Plan Continue ADLS, coordination and grip strength bilateral hands.         Problem List Patient Active Problem List   Diagnosis Date Noted  . Heel spur 05/04/2013  . Weakness 04/17/2013  . Anemia of other chronic disease 06/10/2012  . Essential hypertension, benign 06/03/2012  . Type II or unspecified type diabetes mellitus with neurological manifestations, not stated as  uncontrolled 06/03/2012  . Abnormality of gait 05/28/2012  . Type I (juvenile type) diabetes mellitus with peripheral circulatory disorders, not stated as uncontrolled(250.71) 05/28/2012  . Unspecified hereditary and idiopathic peripheral neuropathy 05/28/2012  . Unspecified late effects of cerebrovascular disease 05/28/2012  . Weakness generalized 05/11/2012  . Blind left eye 12/11/2011  . Neuropathy 12/11/2011  . Elevated LFTs 11/12/2011  . Diabetes mellitus 03/26/2011  . Hyperlipidemia 03/26/2011  . CVA (cerebral infarction) 03/26/2011  . Gait disorder 03/26/2011    Carey Bullocks, OTR/L 01/13/2014, 12:16 PM  [facility] [address] [place], [place], [address] Phone: [phone]   Fax:  [phone]

## 2014-01-17 ENCOUNTER — Ambulatory Visit: Payer: Medicare Other | Attending: Emergency Medicine | Admitting: Occupational Therapy

## 2014-01-17 ENCOUNTER — Encounter: Payer: Self-pay | Admitting: Occupational Therapy

## 2014-01-17 DIAGNOSIS — I639 Cerebral infarction, unspecified: Secondary | ICD-10-CM | POA: Diagnosis not present

## 2014-01-17 DIAGNOSIS — R5381 Other malaise: Secondary | ICD-10-CM | POA: Insufficient documentation

## 2014-01-17 DIAGNOSIS — R262 Difficulty in walking, not elsewhere classified: Secondary | ICD-10-CM | POA: Diagnosis not present

## 2014-01-17 DIAGNOSIS — Z7409 Other reduced mobility: Secondary | ICD-10-CM

## 2014-01-17 DIAGNOSIS — M6281 Muscle weakness (generalized): Secondary | ICD-10-CM | POA: Diagnosis not present

## 2014-01-17 DIAGNOSIS — R278 Other lack of coordination: Secondary | ICD-10-CM

## 2014-01-17 DIAGNOSIS — R279 Unspecified lack of coordination: Secondary | ICD-10-CM

## 2014-01-17 NOTE — Therapy (Signed)
[facility] [address] [place], [place], [address] Phone: [phone]   Fax:  [phone]  Occupational Therapy Treatment  Patient Details  Name: Curtis Brock MRN: [id number] Date of Birth: 1925/02/01  Encounter Date: 01/17/2014      OT End of Session - 01/17/14 0841    Visit Number 5  5/10 G   Number of Visits 16   Date for OT Re-Evaluation 02/21/14   Authorization Type U[employer] MCR - G Code needed!   Authorization Time Period wk 3/8   OT Start Time 0805   OT Stop Time 0845   OT Time Calculation (min) 40 min   Activity Tolerance Patient limited by fatigue  and decreased mobility      Past Medical History  Diagnosis Date  . Diabetes mellitus without complication   . Hypertension     History reviewed. No pertinent past surgical history.  There were no vitals taken for this visit.  Visit Diagnosis:  Generalized muscle weakness  Decreased coordination  Impaired functional mobility, balance, and endurance      Subjective Assessment - 01/17/14 0808    Symptoms "My knee is bothering me again. That shot didn't last long"   Pain Score 8    Pain Location Knee   Pain Orientation Right   Pain Descriptors / Indicators --  O.T. not addressing - outside scope of practice                 OT Treatments/Exercises (OP) - 01/17/14 0809    Shoulder Exercises: ROM/Strengthening   UBE (Upper Arm Bike) UBE x 5 min. for strength/endurance. (therapist would have done 8-10 min. but only 5 min. d/t time restraints.    Hand Exercises   Large Pegboard Pt placing large pegs in pegboard Rt and Lt hand w/ min to no difficulty Rt hand, and min difficulty, drops and extra time w/ the Lt hand.   Other Hand Exercises Gripper set @ 20 lbs resistance to pick up blocks Rt and Lt hand. Pt w/ min to mod difficulty and extra time.                   OT Short Term Goals - 01/13/14 1211    OT SHORT TERM GOAL #1   Title  Pt will be Mod I home program for AROM UE's (due 01/21/14/)   Status Achieved   OT SHORT TERM GOAL #2   Title Pt will be Mod I HEP for coordination   Status Achieved   OT SHORT TERM GOAL #3   Title Pt will be Supervision level LB dressing techniques using A/E PRN seated and sit to standing.   Status On-going  Pt required min assist overall as of 01/13/14           OT Long Term Goals - 01/13/14 1212    OT LONG TERM GOAL #1   Title Pt will be Mod I updated HEP   Time 8   Period Weeks   Status New   OT LONG TERM GOAL #2   Title Pt will demonstrate increased bilateral UE coordination as seen by improved box and blocks scores by 5 or more blocks each hand.   Time 8   Period Weeks   Status New   OT LONG TERM GOAL #3   Title Pt will be Min A donning jacket/shirt seated using hemi-dressing type techniques.   Time 8   Period Weeks   Status Achieved  met  01/13/14 w/ compensations, except starting zipper   OT LONG TERM GOAL #4   Title Pt will be Mod I toileting and clothing management using a/e PRN   Time 8   Period Weeks   Status New   OT LONG TERM GOAL #5   Title Pt will be supervision level tub  transfer & LB ADL's using A/E PRN    Time 8   Period Weeks   Status New               Plan - 01/17/14 0300    Clinical Impression Statement Pt w/ decreased mobility today and extra time to ambulate. Question carryover of recommendations from P.T. at home. Pt required extra time to complete all tasks today.   Plan continue LB dressing and clothes mngmt for toileting, coordination w/ medium sized pegs bilateral hands.         Problem List Patient Active Problem List   Diagnosis Date Noted  . Heel spur 05/04/2013  . Weakness 04/17/2013  . Anemia of other chronic disease 06/10/2012  . Essential hypertension, benign 06/03/2012  . Type II or unspecified type diabetes mellitus with neurological manifestations, not stated as uncontrolled 06/03/2012  . Abnormality of gait  05/28/2012  . Type I (juvenile type) diabetes mellitus with peripheral circulatory disorders, not stated as uncontrolled(250.71) 05/28/2012  . Unspecified hereditary and idiopathic peripheral neuropathy 05/28/2012  . Unspecified late effects of cerebrovascular disease 05/28/2012  . Weakness generalized 05/11/2012  . Blind left eye 12/11/2011  . Neuropathy 12/11/2011  . Elevated LFTs 11/12/2011  . Diabetes mellitus 03/26/2011  . Hyperlipidemia 03/26/2011  . CVA (cerebral infarction) 03/26/2011  . Gait disorder 03/26/2011    Carey Bullocks, OTR/L 01/17/2014, 8:44 AM  [facility] [address] [place], [place], [address] Phone: [phone]   Fax:  [phone]

## 2014-01-20 ENCOUNTER — Ambulatory Visit: Payer: Medicare Other | Admitting: Occupational Therapy

## 2014-01-20 ENCOUNTER — Encounter: Payer: Self-pay | Admitting: Occupational Therapy

## 2014-01-20 DIAGNOSIS — R279 Unspecified lack of coordination: Secondary | ICD-10-CM

## 2014-01-20 DIAGNOSIS — R262 Difficulty in walking, not elsewhere classified: Secondary | ICD-10-CM | POA: Diagnosis not present

## 2014-01-20 DIAGNOSIS — M6281 Muscle weakness (generalized): Secondary | ICD-10-CM | POA: Diagnosis not present

## 2014-01-20 DIAGNOSIS — R5381 Other malaise: Secondary | ICD-10-CM | POA: Diagnosis not present

## 2014-01-20 DIAGNOSIS — Z7409 Other reduced mobility: Secondary | ICD-10-CM

## 2014-01-20 DIAGNOSIS — I639 Cerebral infarction, unspecified: Secondary | ICD-10-CM | POA: Diagnosis not present

## 2014-01-20 DIAGNOSIS — R278 Other lack of coordination: Secondary | ICD-10-CM

## 2014-01-20 NOTE — Therapy (Signed)
[facility] [address] [place], [place], [address] Phone: [phone]   Fax:  [phone]  Occupational Therapy Treatment  Patient Details  Name: Curtis Brock MRN: [id number] Date of Birth: 1925-07-09 Referring Provider:  Darlyne Russian, MD  Encounter Date: 01/20/2014      OT End of Session - 01/20/14 1049    Visit Number 6  6/10 G   Number of Visits 16   Date for OT Re-Evaluation 02/21/14   Authorization Type UHC MCR - G Code needed!   Authorization Time Period wk 3/8   OT Start Time 1020   OT Stop Time 1100   OT Time Calculation (min) 40 min   Activity Tolerance Patient limited by fatigue      Past Medical History  Diagnosis Date  . Diabetes mellitus without complication   . Hypertension     History reviewed. No pertinent past surgical history.  There were no vitals taken for this visit.  Visit Diagnosis:  Generalized muscle weakness  Decreased coordination  Impaired functional mobility, balance, and endurance      Subjective Assessment - 01/20/14 1027    Symptoms I've been walking in the house more"   Currently in Pain? Yes   Pain Score 8    Pain Location Knee   Pain Orientation Right                 OT Treatments/Exercises (OP) - 01/20/14 1029    ADLs   LB Dressing simulated donning pants over feet using theraband w/ min assist. Second trial, pt performed mod I level using reacher. Simulated doffing w/ extra time and min cues, but mod I level.    Toileting Simulated clothes mngmt (donning/doffing over hips) using theraband w/ min cues and extra time at mod I level. Pt performed 4-5 times w/ difficulty getting pulled up in back   Fine Motor Coordination   Other Fine Motor Exercises Pt placing medium sized pegs in pegboard w/ min difficulty Rt hand and min cues to follow design correctly                  OT Short Term Goals - 01/13/14 1211    OT SHORT TERM GOAL #1   Title Pt will be Mod I home program for AROM UE's (due 01/21/14/)   Status Achieved   OT SHORT TERM GOAL #2   Title Pt will be Mod I HEP for coordination   Status Achieved   OT SHORT TERM GOAL #3   Title Pt will be Supervision level LB dressing techniques using A/E PRN seated and sit to standing.   Status On-going  Pt required min assist overall as of 01/13/14           OT Long Term Goals - 01/13/14 1212    OT LONG TERM GOAL #1   Title Pt will be Mod I updated HEP   Time 8   Period Weeks   Status New   OT LONG TERM GOAL #2   Title Pt will demonstrate increased bilateral UE coordination as seen by improved box and blocks scores by 5 or more blocks each hand.   Time 8   Period Weeks   Status New   OT LONG TERM GOAL #3   Title Pt will be Min A donning jacket/shirt seated using hemi-dressing type techniques.   Time 8   Period Weeks   Status Achieved  met 01/13/14 w/ compensations, except starting zipper  OT LONG TERM GOAL #4   Title Pt will be Mod I toileting and clothing management using a/e PRN   Time 8   Period Weeks   Status New   OT LONG TERM GOAL #5   Title Pt will be supervision level tub  transfer & LB ADL's using A/E PRN    Time 8   Period Weeks   Status New               Plan - 01/20/14 1050    Clinical Impression Statement Pt w/ improved mobility today but still slow, extra time to ambulate. Pt w/ greater independence w/ LE dressing   Plan in hand manipulation bilateral hands as able, grip strength bilaterally, and shoulder cane ex's.         Problem List Patient Active Problem List   Diagnosis Date Noted  . Heel spur 05/04/2013  . Weakness 04/17/2013  . Anemia of other chronic disease 06/10/2012  . Essential hypertension, benign 06/03/2012  . Type II or unspecified type diabetes mellitus with neurological manifestations, not stated as uncontrolled 06/03/2012  . Abnormality of gait 05/28/2012  . Type I (juvenile type) diabetes mellitus with  peripheral circulatory disorders, not stated as uncontrolled(250.71) 05/28/2012  . Unspecified hereditary and idiopathic peripheral neuropathy 05/28/2012  . Unspecified late effects of cerebrovascular disease 05/28/2012  . Weakness generalized 05/11/2012  . Blind left eye 12/11/2011  . Neuropathy 12/11/2011  . Elevated LFTs 11/12/2011  . Diabetes mellitus 03/26/2011  . Hyperlipidemia 03/26/2011  . CVA (cerebral infarction) 03/26/2011  . Gait disorder 03/26/2011    Carey Bullocks, OTR/L 01/20/2014, 10:53 AM  [facility] [address] [place], [place], [address] Phone: [phone]   Fax:  [phone]

## 2014-01-24 ENCOUNTER — Encounter: Payer: Self-pay | Admitting: Occupational Therapy

## 2014-01-24 ENCOUNTER — Ambulatory Visit: Payer: Medicare Other | Admitting: Occupational Therapy

## 2014-01-24 DIAGNOSIS — M6281 Muscle weakness (generalized): Secondary | ICD-10-CM

## 2014-01-24 DIAGNOSIS — R278 Other lack of coordination: Secondary | ICD-10-CM

## 2014-01-24 DIAGNOSIS — I639 Cerebral infarction, unspecified: Secondary | ICD-10-CM | POA: Diagnosis not present

## 2014-01-24 DIAGNOSIS — R5381 Other malaise: Secondary | ICD-10-CM | POA: Diagnosis not present

## 2014-01-24 DIAGNOSIS — M1711 Unilateral primary osteoarthritis, right knee: Secondary | ICD-10-CM | POA: Diagnosis not present

## 2014-01-24 DIAGNOSIS — Z7409 Other reduced mobility: Secondary | ICD-10-CM

## 2014-01-24 DIAGNOSIS — R279 Unspecified lack of coordination: Secondary | ICD-10-CM

## 2014-01-24 DIAGNOSIS — R262 Difficulty in walking, not elsewhere classified: Secondary | ICD-10-CM | POA: Diagnosis not present

## 2014-01-24 NOTE — Therapy (Signed)
[facility] [address] [place], [place], [address] Phone: [phone]   Fax:  [phone]  Occupational Therapy Treatment  Patient Details  Name: Curtis Brock MRN: [id number] Date of Birth: 02/15/1925 Referring Provider:  Darlyne Russian, MD  Encounter Date: 01/24/2014      OT End of Session - 01/24/14 0836    Visit Number 7  7/10 G   Number of Visits 16   Date for OT Re-Evaluation 02/21/14   Authorization Type UHC MCR - G Code needed!   Authorization Time Period Wk 4/8   OT Start Time 0800   OT Stop Time 0844   OT Time Calculation (min) 44 min   Activity Tolerance Patient tolerated treatment well;Patient limited by fatigue      Past Medical History  Diagnosis Date  . Diabetes mellitus without complication   . Hypertension     History reviewed. No pertinent past surgical history.  There were no vitals taken for this visit.  Visit Diagnosis:  Generalized muscle weakness  Decreased coordination  Impaired functional mobility, balance, and endurance      Subjective Assessment - 01/24/14 0804    Symptoms "My son-n-law still helps me get dressed"   Currently in Pain? Yes  Rt knee - OT not addressing                 OT Treatments/Exercises (OP) - 01/24/14 9371    Shoulder Exercises: Seated   Other Seated Exercises UBE X 8 min Level 1 for strength/endurance   Fine Motor Coordination   In Hand Manipulation Training Attempted translating stress balls bilaterally but unable. Pt manipulating up to 5 medium sized pegs (from palm to fingertips) w/ max drops, compensations and difficulty Rt hand, unable on Lt. (Pt could move from fingertips to palm bilaterally, but not palm to fingertips)   Hand Exercises   Other Hand Exercises Gripper set @ 30 lbs resistance to pick up blocks Rt and Lt hand. Pt w/ min to mod difficulty and extra time. Pt required reducing resistance back to 20 lbs for Lt hand due to  multiple drops.                   OT Short Term Goals - 01/13/14 1211    OT SHORT TERM GOAL #1   Title Pt will be Mod I home program for AROM UE's (due 01/21/14/)   Status Achieved   OT SHORT TERM GOAL #2   Title Pt will be Mod I HEP for coordination   Status Achieved   OT SHORT TERM GOAL #3   Title Pt will be Supervision level LB dressing techniques using A/E PRN seated and sit to standing.   Status On-going  Pt required min assist overall as of 01/13/14           OT Long Term Goals - 01/13/14 1212    OT LONG TERM GOAL #1   Title Pt will be Mod I updated HEP   Time 8   Period Weeks   Status New   OT LONG TERM GOAL #2   Title Pt will demonstrate increased bilateral UE coordination as seen by improved box and blocks scores by 5 or more blocks each hand.   Time 8   Period Weeks   Status New   OT LONG TERM GOAL #3   Title Pt will be Min A donning jacket/shirt seated using hemi-dressing type techniques.   Time 8   Period  Weeks   Status Achieved  met 01/13/14 w/ compensations, except starting zipper   OT LONG TERM GOAL #4   Title Pt will be Mod I toileting and clothing management using a/e PRN   Time 8   Period Weeks   Status New   OT LONG TERM GOAL #5   Title Pt will be supervision level tub  transfer & LB ADL's using A/E PRN    Time 8   Period Weeks   Status New               Plan - 01/24/14 7858    Clinical Impression Statement Pt w/ decreased bilateral hand coordination and strength as well as overall strength/endurance.    Plan cane ex's, dressing        Problem List Patient Active Problem List   Diagnosis Date Noted  . Heel spur 05/04/2013  . Weakness 04/17/2013  . Anemia of other chronic disease 06/10/2012  . Essential hypertension, benign 06/03/2012  . Type II or unspecified type diabetes mellitus with neurological manifestations, not stated as uncontrolled 06/03/2012  . Abnormality of gait 05/28/2012  . Type I (juvenile type)  diabetes mellitus with peripheral circulatory disorders, not stated as uncontrolled(250.71) 05/28/2012  . Unspecified hereditary and idiopathic peripheral neuropathy 05/28/2012  . Unspecified late effects of cerebrovascular disease 05/28/2012  . Weakness generalized 05/11/2012  . Blind left eye 12/11/2011  . Neuropathy 12/11/2011  . Elevated LFTs 11/12/2011  . Diabetes mellitus 03/26/2011  . Hyperlipidemia 03/26/2011  . CVA (cerebral infarction) 03/26/2011  . Gait disorder 03/26/2011    Carey Bullocks, OTR/L 01/24/2014, 8:39 AM  [facility] [address] [place], [place], [address] Phone: [phone]   Fax:  [phone]

## 2014-01-27 ENCOUNTER — Ambulatory Visit: Payer: Medicare Other | Admitting: Occupational Therapy

## 2014-01-27 ENCOUNTER — Encounter: Payer: Self-pay | Admitting: Occupational Therapy

## 2014-01-27 DIAGNOSIS — R279 Unspecified lack of coordination: Secondary | ICD-10-CM

## 2014-01-27 DIAGNOSIS — R5381 Other malaise: Secondary | ICD-10-CM | POA: Diagnosis not present

## 2014-01-27 DIAGNOSIS — R278 Other lack of coordination: Secondary | ICD-10-CM

## 2014-01-27 DIAGNOSIS — M6281 Muscle weakness (generalized): Secondary | ICD-10-CM

## 2014-01-27 DIAGNOSIS — Z7409 Other reduced mobility: Secondary | ICD-10-CM

## 2014-01-27 DIAGNOSIS — R262 Difficulty in walking, not elsewhere classified: Secondary | ICD-10-CM | POA: Diagnosis not present

## 2014-01-27 DIAGNOSIS — I639 Cerebral infarction, unspecified: Secondary | ICD-10-CM | POA: Diagnosis not present

## 2014-01-27 NOTE — Therapy (Signed)
[facility] [address] [place], [place], [address] Phone: [phone]   Fax:  [phone]  Occupational Therapy Treatment  Patient Details  Name: Curtis Brock MRN: [id number] Date of Birth: December 15, 1925 Referring Provider:  Darlyne Russian, MD  Encounter Date: 01/27/2014      OT End of Session - 01/27/14 1048    Visit Number 8  8/10 G   Number of Visits 16   Date for OT Re-Evaluation 02/21/14   Authorization Type UHC MCR - G Code needed!   Authorization Time Period Wk 4/8   OT Start Time 1020   OT Stop Time 1100   OT Time Calculation (min) 40 min   Activity Tolerance Patient tolerated treatment well;Patient limited by fatigue      Past Medical History  Diagnosis Date  . Diabetes mellitus without complication   . Hypertension     History reviewed. No pertinent past surgical history.  There were no vitals taken for this visit.  Visit Diagnosis:  Generalized muscle weakness  Decreased coordination  Impaired functional mobility, balance, and endurance      Subjective Assessment - 01/27/14 1025    Symptoms The Dr. gave me another shot in my knee. I dressed myself today!   Currently in Pain? No/denies                 OT Treatments/Exercises (OP) - 01/27/14 1028    Shoulder Exercises: ROM/Strengthening   UBE (Upper Arm Bike) UBE x 5 min. for strength/endurance. (therapist would have done 8-10 min. but only 5 min. d/t time restraints.    Other ROM/Strengthening Exercises Reviewed cane ex's: shoulder flexion and abduction (seated) however instructed pt he can also do shoulder flexion supine. Shoulder extension (palms forward), and shoulder IR (palms back) in standing w/ supervision x 10 reps each bilaterally   Other ROM/Strengthening Exercises Sit to stand x 10 reps for bilateral UE, LE strength and transfers.    Fine Motor Coordination   Other Fine Motor Exercises Pt placing small sized pegs in  pegboard w/ min difficulty Rt hand, and occasional assist from Lt hand.  Pt required min cues to follow simple design correctly.   Other Fine Motor Exercises Pt attempting to translate stress balls in palm bilateral hands but unable.                   OT Short Term Goals - 01/13/14 1211    OT SHORT TERM GOAL #1   Title Pt will be Mod I home program for AROM UE's (due 01/21/14/)   Status Achieved   OT SHORT TERM GOAL #2   Title Pt will be Mod I HEP for coordination   Status Achieved   OT SHORT TERM GOAL #3   Title Pt will be Supervision level LB dressing techniques using A/E PRN seated and sit to standing.   Status On-going  Pt required min assist overall as of 01/13/14           OT Long Term Goals - 01/13/14 1212    OT LONG TERM GOAL #1   Title Pt will be Mod I updated HEP   Time 8   Period Weeks   Status New   OT LONG TERM GOAL #2   Title Pt will demonstrate increased bilateral UE coordination as seen by improved box and blocks scores by 5 or more blocks each hand.   Time 8   Period Weeks   Status New  OT LONG TERM GOAL #3   Title Pt will be Min A donning jacket/shirt seated using hemi-dressing type techniques.   Time 8   Period Weeks   Status Achieved  met 01/13/14 w/ compensations, except starting zipper   OT LONG TERM GOAL #4   Title Pt will be Mod I toileting and clothing management using a/e PRN   Time 8   Period Weeks   Status New   OT LONG TERM GOAL #5   Title Pt will be supervision level tub  transfer & LB ADL's using A/E PRN    Time 8   Period Weeks   Status New               Plan - 01/27/14 1048    Clinical Impression Statement Pt making slow progress but not consistent w/ carryover at home. pt limited by endurance and decreased mobility. Pt w/ no in hand manipulation, but can pick up small pegs b/t fingertips.   Plan dressing, grip strength        Problem List Patient Active Problem List   Diagnosis Date Noted  . Heel spur  05/04/2013  . Weakness 04/17/2013  . Anemia of other chronic disease 06/10/2012  . Essential hypertension, benign 06/03/2012  . Type II or unspecified type diabetes mellitus with neurological manifestations, not stated as uncontrolled 06/03/2012  . Abnormality of gait 05/28/2012  . Type I (juvenile type) diabetes mellitus with peripheral circulatory disorders, not stated as uncontrolled(250.71) 05/28/2012  . Unspecified hereditary and idiopathic peripheral neuropathy 05/28/2012  . Unspecified late effects of cerebrovascular disease 05/28/2012  . Weakness generalized 05/11/2012  . Blind left eye 12/11/2011  . Neuropathy 12/11/2011  . Elevated LFTs 11/12/2011  . Diabetes mellitus 03/26/2011  . Hyperlipidemia 03/26/2011  . CVA (cerebral infarction) 03/26/2011  . Gait disorder 03/26/2011    Carey Bullocks, OTR/L 01/27/2014, 10:56 AM  [facility] [address] [place], [place], [address] Phone: [phone]   Fax:  [phone]

## 2014-01-31 DIAGNOSIS — M1711 Unilateral primary osteoarthritis, right knee: Secondary | ICD-10-CM | POA: Diagnosis not present

## 2014-02-01 ENCOUNTER — Ambulatory Visit: Payer: Medicare Other | Admitting: Occupational Therapy

## 2014-02-01 ENCOUNTER — Encounter: Payer: Self-pay | Admitting: Occupational Therapy

## 2014-02-01 DIAGNOSIS — M6281 Muscle weakness (generalized): Secondary | ICD-10-CM | POA: Diagnosis not present

## 2014-02-01 DIAGNOSIS — Z7409 Other reduced mobility: Secondary | ICD-10-CM

## 2014-02-01 DIAGNOSIS — I639 Cerebral infarction, unspecified: Secondary | ICD-10-CM | POA: Diagnosis not present

## 2014-02-01 DIAGNOSIS — R278 Other lack of coordination: Secondary | ICD-10-CM

## 2014-02-01 DIAGNOSIS — R279 Unspecified lack of coordination: Secondary | ICD-10-CM

## 2014-02-01 DIAGNOSIS — R5381 Other malaise: Secondary | ICD-10-CM | POA: Diagnosis not present

## 2014-02-01 DIAGNOSIS — R262 Difficulty in walking, not elsewhere classified: Secondary | ICD-10-CM | POA: Diagnosis not present

## 2014-02-01 NOTE — Therapy (Signed)
[facility] [address] [place], [place], [address] Phone: [phone]   Fax:  [phone]  Occupational Therapy Treatment  Patient Details  Name: Curtis Brock MRN: [id number] Date of Birth: [birth year] Referring Provider:  Darlyne Russian, MD  Encounter Date: 02/01/2014      OT End of Session - 02/01/14 1127    Visit Number 9  9/10 G   Number of Visits 16   Date for OT Re-Evaluation 02/21/14   Authorization Type UHC MCR - G Code needed!   Authorization Time Period Wk 5/8   OT Start Time 1020   OT Stop Time 1103   OT Time Calculation (min) 43 min   Activity Tolerance Patient limited by fatigue      Past Medical History  Diagnosis Date  . Diabetes mellitus without complication   . Hypertension     History reviewed. No pertinent past surgical history.  There were no vitals taken for this visit.  Visit Diagnosis:  Generalized muscle weakness  Decreased coordination  Impaired functional mobility, balance, and endurance      Subjective Assessment - 02/01/14 1029    Symptoms Daughter reports pt and her both fell backwards this morning at their porch. Pt reports no increase in pain anywhere.    Currently in Pain? No/denies                 OT Treatments/Exercises (OP) - 02/01/14 1047    ADLs   LB Dressing Simulated donning/doffing pants w/ theraband using reacher to assist w/ donning/doffing over feet. Pt then pulling over hips w/ min assist to get fully up in back. Doffing I'ly using reacher. Doffing shoes I'ly. Donning shoes using shoe horn and velcro shoes.    ADL Comments Discussed ways to implement ex's into daily routine w/ pt/daughter to encourage carryover at home. Daughter verbalized understanding   Shoulder Exercises: ROM/Strengthening   Other ROM/Strengthening Exercises Sit to stand x 10 reps for bilateral UE, LE strength and transfers.                   OT Short Term Goals -  01/13/14 1211    OT SHORT TERM GOAL #1   Title Pt will be Mod I home program for AROM UE's (due 01/21/14/)   Status Achieved   OT SHORT TERM GOAL #2   Title Pt will be Mod I HEP for coordination   Status Achieved   OT SHORT TERM GOAL #3   Title Pt will be Supervision level LB dressing techniques using A/E PRN seated and sit to standing.   Status On-going  Pt required min assist overall as of 01/13/14           OT Long Term Goals - 01/13/14 1212    OT LONG TERM GOAL #1   Title Pt will be Mod I updated HEP   Time 8   Period Weeks   Status New   OT LONG TERM GOAL #2   Title Pt will demonstrate increased bilateral UE coordination as seen by improved box and blocks scores by 5 or more blocks each hand.   Time 8   Period Weeks   Status New   OT LONG TERM GOAL #3   Title Pt will be Min A donning jacket/shirt seated using hemi-dressing type techniques.   Time 8   Period Weeks   Status Achieved  met 01/13/14 w/ compensations, except starting zipper   OT LONG TERM GOAL #4  Title Pt will be Mod I toileting and clothing management using a/e PRN   Time 8   Period Weeks   Status New   OT LONG TERM GOAL #5   Title Pt will be supervision level tub  transfer & LB ADL's using A/E PRN    Time 8   Period Weeks   Status New               Plan - 02/01/14 1128    Clinical Impression Statement Long discussion w/ pt and daughter regarding poor carryover at home and limited progress. Discussed ways to implement ex's and activity into daily routine to make habit.    Plan G-code next visit! Anticipate d/c next week due to lack of progress. Begin checking STG's not met and LTG's        Problem List Patient Active Problem List   Diagnosis Date Noted  . Heel spur 05/04/2013  . Weakness 04/17/2013  . Anemia of other chronic disease 06/10/2012  . Essential hypertension, benign 06/03/2012  . Type II or unspecified type diabetes mellitus with neurological manifestations, not stated  as uncontrolled 06/03/2012  . Abnormality of gait 05/28/2012  . Type I (juvenile type) diabetes mellitus with peripheral circulatory disorders, not stated as uncontrolled(250.71) 05/28/2012  . Unspecified hereditary and idiopathic peripheral neuropathy 05/28/2012  . Unspecified late effects of cerebrovascular disease 05/28/2012  . Weakness generalized 05/11/2012  . Blind left eye 12/11/2011  . Neuropathy 12/11/2011  . Elevated LFTs 11/12/2011  . Diabetes mellitus 03/26/2011  . Hyperlipidemia 03/26/2011  . CVA (cerebral infarction) 03/26/2011  . Gait disorder 03/26/2011    Carey Bullocks, OTR/L 02/01/2014, 11:30 AM  [facility] [address] [place], [place], [address] Phone: [phone]   Fax:  [phone]

## 2014-02-03 ENCOUNTER — Encounter: Payer: Self-pay | Admitting: Occupational Therapy

## 2014-02-03 ENCOUNTER — Ambulatory Visit: Payer: Medicare Other | Admitting: Occupational Therapy

## 2014-02-03 DIAGNOSIS — R279 Unspecified lack of coordination: Secondary | ICD-10-CM

## 2014-02-03 DIAGNOSIS — R262 Difficulty in walking, not elsewhere classified: Secondary | ICD-10-CM | POA: Diagnosis not present

## 2014-02-03 DIAGNOSIS — R5381 Other malaise: Secondary | ICD-10-CM | POA: Diagnosis not present

## 2014-02-03 DIAGNOSIS — I639 Cerebral infarction, unspecified: Secondary | ICD-10-CM | POA: Diagnosis not present

## 2014-02-03 DIAGNOSIS — M6281 Muscle weakness (generalized): Secondary | ICD-10-CM

## 2014-02-03 DIAGNOSIS — Z7409 Other reduced mobility: Secondary | ICD-10-CM

## 2014-02-03 DIAGNOSIS — R278 Other lack of coordination: Secondary | ICD-10-CM

## 2014-02-03 NOTE — Therapy (Signed)
[facility] [address] [place], [place], [address] Phone: [phone]   Fax:  [phone]  Occupational Therapy Treatment  Patient Details  Name: Curtis Brock MRN: [id number] Date of Birth: [birth year] Referring Provider:  Darlyne Russian, MD  Encounter Date: 02/03/2014      OT End of Session - 02/03/14 1054    Visit Number 10  G code taken today   Number of Visits 16   Date for OT Re-Evaluation 02/21/14   Authorization Type UHC MCR - G Code needed!   Authorization Time Period wk 5/8   OT Start Time 1018   OT Stop Time 1100   OT Time Calculation (min) 42 min   Activity Tolerance Patient limited by fatigue      Past Medical History  Diagnosis Date  . Diabetes mellitus without complication   . Hypertension     History reviewed. No pertinent past surgical history.  There were no vitals taken for this visit.  Visit Diagnosis:  Generalized muscle weakness  Decreased coordination  Impaired functional mobility, balance, and endurance      Subjective Assessment - 02/03/14 1020    Symptoms Pt denies pain.    Currently in Pain? No/denies          [facility] OT Assessment - 02/03/14 1046    Coordination   Box and Blocks Rt = 22, Lt = 20 (eval: Rt = 21, Lt = 24)               OT Treatments/Exercises (OP) - 02/03/14 1042    ADLs   UB Dressing Pt doffing jacket w/ min cues and extra time but modified independence. Pt donning jacket using strategies/adaptations w/ mod v.c's and min assist to pull down in back   ADL Comments Began assessing LTG's and reviewing w/ pt and daughter. Also completed G-code today    Hand Exercises   Other Hand Exercises Gripper set @ 20 lbs resistance (yellow coil) to pick up blocks Rt and Lt hand. Pt w/ min to mod difficulty and extra time. Redcued to 10 lbs for Lt hand due to max difficulty and drops                  OT Short Term Goals - 02/03/14 1048    OT  SHORT TERM GOAL #1   Title Pt will be Mod I home program for AROM UE's (due 01/21/14/)   Status Achieved   OT SHORT TERM GOAL #2   Title Pt will be Mod I HEP for coordination   Status Achieved   OT SHORT TERM GOAL #3   Title Pt will be Supervision level LB dressing techniques using A/E PRN seated and sit to standing.   Status Not Met  Pt required min assist overall as of 02/03/14)           OT Long Term Goals - 02/03/14 1049    OT LONG TERM GOAL #1   Title Pt will be Mod I updated HEP   Time 8   Period Weeks   Status Deferred  not appropriate at this time   OT LONG TERM GOAL #2   Title Pt will demonstrate increased bilateral UE coordination as seen by improved box and blocks scores by 5 or more blocks each hand.   Time 8   Period Weeks   Status Not Met  Rt = 22, Lt = 20 (eval Rt = 21, Lt = 24)   OT  LONG TERM GOAL #3   Title Pt will be Min A donning jacket/shirt seated using hemi-dressing type techniques.   Time 8   Period Weeks   Status Achieved  met 01/13/14 w/ compensations, except starting zipper   OT LONG TERM GOAL #4   Title Pt will be Mod I toileting and clothing management using a/e PRN   Time 8   Period Weeks   Status Not Met  not consistent, needs assist for thoroughness   OT LONG TERM GOAL #5   Title Pt will be supervision level tub  transfer & LB ADL's using A/E PRN    Time 8   Period Weeks   Status On-going               Plan - 02-25-14 1055    Clinical Impression Statement Assessed LTG's #1-4 today (see goals). Pt w/ limited progress, only slightly increased independence w/ dressing.    Plan D/C next week (another G-code needed), assess LTG #5          G-Codes - 02-25-2014 1021    Functional Assessment Tool Used Box and Blocks Rt = 22, Lt = 20 and clinical judgement   Functional Limitation Carrying, moving and handling objects   Carrying, Moving and Handling Objects Current Status (A3557) At least 40 percent but less than 60 percent  impaired, limited or restricted   Carrying, Moving and Handling Objects Goal Status (D2202) At least 20 percent but less than 40 percent impaired, limited or restricted      Problem List Patient Active Problem List   Diagnosis Date Noted  . Heel spur 05/04/2013  . Weakness 04/17/2013  . Anemia of other chronic disease 06/10/2012  . Essential hypertension, benign 06/03/2012  . Type II or unspecified type diabetes mellitus with neurological manifestations, not stated as uncontrolled 06/03/2012  . Abnormality of gait 05/28/2012  . Type I (juvenile type) diabetes mellitus with peripheral circulatory disorders, not stated as uncontrolled(250.71) 05/28/2012  . Unspecified hereditary and idiopathic peripheral neuropathy 05/28/2012  . Unspecified late effects of cerebrovascular disease 05/28/2012  . Weakness generalized 05/11/2012  . Blind left eye 12/11/2011  . Neuropathy 12/11/2011  . Elevated LFTs 11/12/2011  . Diabetes mellitus 03/26/2011  . Hyperlipidemia 03/26/2011  . CVA (cerebral infarction) 03/26/2011  . Gait disorder 03/26/2011    Carey Bullocks, OTR/L February 25, 2014, 11:01 AM  [facility] [address] [place], [place], [address] Phone: [phone]   Fax:  [phone]

## 2014-02-05 ENCOUNTER — Emergency Department (HOSPITAL_COMMUNITY): Payer: Medicare Other

## 2014-02-05 ENCOUNTER — Encounter (HOSPITAL_COMMUNITY): Payer: Self-pay | Admitting: *Deleted

## 2014-02-05 ENCOUNTER — Emergency Department (HOSPITAL_COMMUNITY)
Admission: EM | Admit: 2014-02-05 | Discharge: 2014-02-06 | Disposition: A | Discharge status: Home / Self Care | Payer: Medicare Other | Attending: Emergency Medicine | Admitting: Emergency Medicine

## 2014-02-05 DIAGNOSIS — S0990XA Unspecified injury of head, initial encounter: Secondary | ICD-10-CM | POA: Diagnosis not present

## 2014-02-05 DIAGNOSIS — I10 Essential (primary) hypertension: Secondary | ICD-10-CM | POA: Diagnosis not present

## 2014-02-05 DIAGNOSIS — E119 Type 2 diabetes mellitus without complications: Secondary | ICD-10-CM | POA: Diagnosis not present

## 2014-02-05 DIAGNOSIS — R404 Transient alteration of awareness: Secondary | ICD-10-CM | POA: Diagnosis not present

## 2014-02-05 DIAGNOSIS — Y9289 Other specified places as the place of occurrence of the external cause: Secondary | ICD-10-CM | POA: Diagnosis not present

## 2014-02-05 DIAGNOSIS — Z0000 Encounter for general adult medical examination without abnormal findings: Secondary | ICD-10-CM | POA: Insufficient documentation

## 2014-02-05 DIAGNOSIS — Z008 Encounter for other general examination: Secondary | ICD-10-CM

## 2014-02-05 DIAGNOSIS — S99922A Unspecified injury of left foot, initial encounter: Secondary | ICD-10-CM | POA: Diagnosis not present

## 2014-02-05 DIAGNOSIS — Y998 Other external cause status: Secondary | ICD-10-CM | POA: Insufficient documentation

## 2014-02-05 DIAGNOSIS — W1839XA Other fall on same level, initial encounter: Secondary | ICD-10-CM | POA: Diagnosis not present

## 2014-02-05 DIAGNOSIS — Z7982 Long term (current) use of aspirin: Secondary | ICD-10-CM | POA: Diagnosis not present

## 2014-02-05 DIAGNOSIS — Z79899 Other long term (current) drug therapy: Secondary | ICD-10-CM | POA: Insufficient documentation

## 2014-02-05 DIAGNOSIS — B3789 Other sites of candidiasis: Secondary | ICD-10-CM | POA: Insufficient documentation

## 2014-02-05 DIAGNOSIS — M79672 Pain in left foot: Secondary | ICD-10-CM

## 2014-02-05 DIAGNOSIS — S99921A Unspecified injury of right foot, initial encounter: Secondary | ICD-10-CM | POA: Insufficient documentation

## 2014-02-05 DIAGNOSIS — W19XXXA Unspecified fall, initial encounter: Secondary | ICD-10-CM

## 2014-02-05 DIAGNOSIS — Y9389 Activity, other specified: Secondary | ICD-10-CM | POA: Insufficient documentation

## 2014-02-05 DIAGNOSIS — H5442 Blindness, left eye, normal vision right eye: Secondary | ICD-10-CM | POA: Diagnosis not present

## 2014-02-05 DIAGNOSIS — M79671 Pain in right foot: Secondary | ICD-10-CM | POA: Diagnosis not present

## 2014-02-05 DIAGNOSIS — R531 Weakness: Secondary | ICD-10-CM | POA: Diagnosis not present

## 2014-02-05 DIAGNOSIS — J9811 Atelectasis: Secondary | ICD-10-CM | POA: Diagnosis not present

## 2014-02-05 HISTORY — DX: Blindness, one eye, unspecified eye: H54.40

## 2014-02-05 LAB — COMPREHENSIVE METABOLIC PANEL
ALT: 49 U/L (ref 0–53)
ANION GAP: 9 (ref 5–15)
AST: 164 U/L — ABNORMAL HIGH (ref 0–37)
Albumin: 3.7 g/dL (ref 3.5–5.2)
Alkaline Phosphatase: 63 U/L (ref 39–117)
BILIRUBIN TOTAL: 0.8 mg/dL (ref 0.3–1.2)
BUN: 23 mg/dL (ref 6–23)
CO2: 27 mmol/L (ref 19–32)
Calcium: 9.3 mg/dL (ref 8.4–10.5)
Chloride: 104 mmol/L (ref 96–112)
Creatinine, Ser: 1.01 mg/dL (ref 0.50–1.35)
GFR, EST AFRICAN AMERICAN: 76 mL/min — AB (ref >90)
GFR, EST NON AFRICAN AMERICAN: 65 mL/min — AB (ref >90)
GLUCOSE: 135 mg/dL — AB (ref 70–99)
Potassium: 3.7 mmol/L (ref 3.5–5.1)
SODIUM: 140 mmol/L (ref 135–145)
Total Protein: 6.9 g/dL (ref 6.0–8.3)

## 2014-02-05 LAB — URINALYSIS, ROUTINE W REFLEX MICROSCOPIC
BILIRUBIN URINE: NEGATIVE
Glucose, UA: NEGATIVE mg/dL
Hgb urine dipstick: NEGATIVE
Ketones, ur: NEGATIVE mg/dL
LEUKOCYTES UA: NEGATIVE
Nitrite: NEGATIVE
Protein, ur: NEGATIVE mg/dL
Specific Gravity, Urine: 1.019 (ref 1.005–1.030)
Urobilinogen, UA: 1 mg/dL (ref 0.0–1.0)
pH: 5 (ref 5.0–8.0)

## 2014-02-05 LAB — CBC WITH DIFFERENTIAL/PLATELET
BASOS ABS: 0 10*3/uL (ref 0.0–0.1)
Basophils Relative: 0 % (ref 0–1)
EOS ABS: 0.1 10*3/uL (ref 0.0–0.7)
Eosinophils Relative: 2 % (ref 0–5)
HCT: 36.5 % — ABNORMAL LOW (ref 39.0–52.0)
Hemoglobin: 12.3 g/dL — ABNORMAL LOW (ref 13.0–17.0)
LYMPHS ABS: 1.2 10*3/uL (ref 0.7–4.0)
Lymphocytes Relative: 18 % (ref 12–46)
MCH: 27.6 pg (ref 26.0–34.0)
MCHC: 33.7 g/dL (ref 30.0–36.0)
MCV: 82 fL (ref 78.0–100.0)
MONO ABS: 0.7 10*3/uL (ref 0.1–1.0)
Monocytes Relative: 11 % (ref 3–12)
Neutro Abs: 4.6 10*3/uL (ref 1.7–7.7)
Neutrophils Relative %: 69 % (ref 43–77)
Platelets: 194 10*3/uL (ref 150–400)
RBC: 4.45 MIL/uL (ref 4.22–5.81)
RDW: 13.5 % (ref 11.5–15.5)
WBC: 6.6 10*3/uL (ref 4.0–10.5)

## 2014-02-05 LAB — I-STAT TROPONIN, ED: TROPONIN I, POC: 0 ng/mL (ref 0.00–0.08)

## 2014-02-05 MED ORDER — FUROSEMIDE 40 MG PO TABS
20.0000 mg | ORAL_TABLET | Freq: Every day | ORAL | Status: DC
  Administered 2014-02-06: 20 mg via ORAL
  Filled 2014-02-05: qty 1

## 2014-02-05 MED ORDER — LOSARTAN POTASSIUM 50 MG PO TABS
50.0000 mg | ORAL_TABLET | Freq: Every day | ORAL | Status: DC
  Administered 2014-02-06: 50 mg via ORAL
  Filled 2014-02-05: qty 1

## 2014-02-05 MED ORDER — ACETAMINOPHEN 325 MG PO TABS
650.0000 mg | ORAL_TABLET | Freq: Four times a day (QID) | ORAL | Status: DC | PRN
  Administered 2014-02-06: 650 mg via ORAL
  Filled 2014-02-05: qty 2

## 2014-02-05 MED ORDER — NYSTATIN 100000 UNIT/GM EX CREA
TOPICAL_CREAM | Freq: Two times a day (BID) | CUTANEOUS | Status: DC
  Administered 2014-02-05 – 2014-02-06 (×2): via TOPICAL
  Filled 2014-02-05 (×2): qty 15

## 2014-02-05 MED ORDER — ASPIRIN EC 81 MG PO TBEC
81.0000 mg | DELAYED_RELEASE_TABLET | Freq: Every day | ORAL | Status: DC
  Administered 2014-02-06: 81 mg via ORAL
  Filled 2014-02-05: qty 1

## 2014-02-05 NOTE — ED Provider Notes (Signed)
CSN: [id number]     Arrival date & time 02/05/14  1732 History   First MD Initiated Contact with Patient 02/05/14 1737     Chief Complaint  Patient presents with  . Fall     (Consider location/radiation/quality/duration/timing/severity/associated sxs/prior Treatment) HPI Comments: Patient with past medical history remarkable for diabetes and hypertension, presents to the emergency department with chief complaint of 2 falls. Patient states that he has fallen twice today. He is uncertain as to what caused the fall. This is why he came to the emergency department. He denies any pain. Denies any headaches, neck pain, chest pain, shortness breath, nausea, vomiting, diarrhea, or constipation. He states that he is pain-free now. He has not taken anything to alleviate her symptoms. Per medical record, the patient's family no longer wants to provide care for the patient, and would like to have him placed into a facility.  The history is provided by the patient. No language interpreter was used.    Past Medical History  Diagnosis Date  . Diabetes mellitus without complication   . Hypertension   . Blind left eye    History reviewed. No pertinent past surgical history. No family history on file. History  Substance Use Topics  . Smoking status: Never Smoker   . Smokeless tobacco: Not on file  . Alcohol Use: No    Review of Systems  Constitutional: Negative for fever and chills.  Respiratory: Negative for shortness of breath.   Cardiovascular: Negative for chest pain.  Gastrointestinal: Negative for nausea, vomiting, diarrhea and constipation.  Genitourinary: Negative for dysuria.  All other systems reviewed and are negative.     Allergies  Review of patient's allergies indicates no known allergies.  Home Medications   Prior to Admission medications   Medication Sig Start Date End Date Taking? Authorizing Provider  acetaminophen (TYLENOL) 325 MG tablet Take 2 tablets (650 mg total)  by mouth every 6 (six) hours as needed for mild pain or moderate pain (or Fever >/= 101). 04/20/13   Sheila Oats, MD  aspirin EC 81 MG tablet Take 81 mg by mouth daily.    Historical Provider, MD  cyanocobalamin (,VITAMIN B-12,) 1000 MCG/ML injection INJECT 1 ML AS DIRECTED EVERY MONTH 04/05/13   Darlyne Russian, MD  docusate sodium 100 MG CAPS Take 100 mg by mouth 2 (two) times daily. Patient not taking: Reported on 12/28/2013 04/20/13   Sheila Oats, MD  furosemide (LASIX) 20 MG tablet TAKE 1 TABLET (20 MG TOTAL) BY MOUTH DAILY. 11/15/13   Darlyne Russian, MD  losartan (COZAAR) 50 MG tablet TAKE ONE TABLET BY MOUTH ONCE DAILY 08/05/13   Darlyne Russian, MD  zoster vaccine live, PF, (ZOSTAVAX) 03474 UNT/0.65ML injection Inject 19,400 Units into the skin once. 07/01/13   Darlyne Russian, MD   BP 117/33 mmHg  Pulse 83  Temp(Src) 94.8 F (34.9 C) (Rectal)  Resp 16  SpO2 100% Physical Exam  Constitutional: He is oriented to person, place, and time. He appears well-developed and well-nourished.  HENT:  Head: Normocephalic and atraumatic.  Eyes: Conjunctivae and EOM are normal. Pupils are equal, round, and reactive to light. Right eye exhibits no discharge. Left eye exhibits no discharge. No scleral icterus.  Neck: Normal range of motion. Neck supple. No JVD present.  Cardiovascular: Normal rate, regular rhythm and normal heart sounds.  Exam reveals no gallop and no friction rub.   No murmur heard. Pulmonary/Chest: Effort normal and breath sounds normal. No respiratory  distress. He has no wheezes. He has no rales. He exhibits no tenderness.  Abdominal: Soft. He exhibits no distension and no mass. There is no tenderness. There is no rebound and no guarding.  Musculoskeletal: Normal range of motion. He exhibits no edema or tenderness.  Neurological: He is alert and oriented to person, place, and time.  Skin: Skin is warm and dry.  Candidiasis on abdominal pannus  Psychiatric: He has a normal mood and  affect. His behavior is normal. Judgment and thought content normal.  Nursing note and vitals reviewed.   ED Course  Procedures (including critical care time) Labs Review Labs Reviewed  CBC WITH DIFFERENTIAL/PLATELET  COMPREHENSIVE METABOLIC PANEL  URINALYSIS, ROUTINE W REFLEX MICROSCOPIC  I-STAT TROPOININ, ED    Imaging Review No results found.   EKG Interpretation None      MDM   Final diagnoses:  Fall  Encounter for medical assessment    Patient with multiple falls, difficulty fulfilling ADLs, (patient found covered in urine).  Family reportedly unwilling to provide care.  Patient seen by and discussed with Dr. Wilson Singer.  Labs and imaging are reassuring.  Hypothermia has corrected.  Patient is well appearing and not complaining of any pain.  Dr. Wilson Singer recommends social work consult 2/2 patient not keeping up with ADLs and family being unwilling/unable to continuing caring for patient.  I have not actually spoken with the family, but have tried multiple times to reach them.  Social work not Electrical engineer, therefore we will hold the patient in the ED, until family can be reached or social work contacted.  Whichever comes first.  Dr. Wilson Singer agrees with the plan.  Dr. Wilson Singer discussed the patient with Dr. Sharol Given.  Plan:  Hold until seen by social work.  Montine Circle, PA-C 02/06/14 0003  Virgel Manifold, MD 02/06/14 [phone]

## 2014-02-05 NOTE — ED Notes (Signed)
MD at bedside. 

## 2014-02-05 NOTE — ED Notes (Signed)
Multiple attempts made to contacts pts family for d/c Curtis Brock or Curtis Brock [phone]

## 2014-02-05 NOTE — ED Notes (Signed)
Patient transported to CT 

## 2014-02-05 NOTE — ED Notes (Signed)
EMS reports pt slid off bed x 2 today, family no longer wants take care of him at home. Pt without injury Daughter stated to EMS, they are not picking him up no more, she ain't hurtin her back and her husband is not hurtin his.

## 2014-02-05 NOTE — ED Notes (Signed)
Bed: WA09 Expected date: 02/05/14 Expected time: 5:24 PM Means of arrival: Ambulance Comments: Slid off bed no injury, family unable to take care of

## 2014-02-05 NOTE — ED Provider Notes (Signed)
Medical screening examination/treatment/procedure(s) were conducted as a shared visit with non-physician practitioner(s) and myself.  I personally evaluated the patient during the encounter.   EKG Interpretation None     88y male presented to the emergency room for evaluation after a fall.  Patient is coming from home. He reports that he lives with his daughter and son-in-law in their home. Per EMS report, they are no longer willing to take care of him at home. He has no acute complaints. He is answering questions appropriately but he cannot give me an explanation as to why he fell. He does have listed history of gait instability though and from review of records has been getting regular PT/OT for at leas the last couple months. His workup has been pretty unremarkable. Numerous attempts to contact both daughter and son-in-law have been unsuccessful. Do not have a medical reason to admit at this time. Unless we can contact his family, will hold in the emergency room to discuss with social work in the morning.   Virgel Manifold, MD 02/05/14 [phone]

## 2014-02-06 ENCOUNTER — Emergency Department (HOSPITAL_COMMUNITY): Payer: Medicare Other

## 2014-02-06 DIAGNOSIS — S99922A Unspecified injury of left foot, initial encounter: Secondary | ICD-10-CM | POA: Diagnosis not present

## 2014-02-06 DIAGNOSIS — S99921A Unspecified injury of right foot, initial encounter: Secondary | ICD-10-CM | POA: Diagnosis not present

## 2014-02-06 DIAGNOSIS — M79672 Pain in left foot: Secondary | ICD-10-CM | POA: Diagnosis not present

## 2014-02-06 DIAGNOSIS — M79671 Pain in right foot: Secondary | ICD-10-CM | POA: Diagnosis not present

## 2014-02-06 DIAGNOSIS — I6789 Other cerebrovascular disease: Secondary | ICD-10-CM | POA: Diagnosis not present

## 2014-02-06 DIAGNOSIS — R259 Unspecified abnormal involuntary movements: Secondary | ICD-10-CM | POA: Diagnosis not present

## 2014-02-06 NOTE — Progress Notes (Signed)
CARE MANAGEMENT NOTE 02/06/2014  Patient:  Curtis Brock, Curtis Brock   Account Number:  [id number]  Date Initiated:  02/06/2014  Documentation initiated by:  [facility]  Subjective/Objective Assessment:   leg pain     Action/Plan:   Anticipated DC Date:  02/06/2014   Anticipated DC Plan:  [place]  CM consult      [facility] Choice  HOME HEALTH   Choice offered to / List presented to:  C-1 Patient        [place] arranged  HH-1 RN  [place]      [place].   Status of service:  Completed, signed off Medicare Important Message given?   (If response is "NO", the following Medicare IM given date fields will be blank) Date Medicare IM given:   Medicare IM given by:   Date Additional Medicare IM given:   Additional Medicare IM given by:    Discharge Disposition:  [place]  Per UR Regulation:    If discussed at Long Length of Stay Meetings, dates discussed:    Comments:  12/08/2014 1200 Pt gave permission to speak to dtr. NCM spoke to dtr, [name], [phone]. States pt lives at home with her and her husband. He has RW at home. Offered choice for [facility]. Pt had [facility] in the past. Requesting [facility] for [facility]. Contacted [facility] for [facility]. Jonnie Finner RN CCM Case Mgmt phone [phone]

## 2014-02-06 NOTE — ED Notes (Signed)
Social work at bedside.  

## 2014-02-06 NOTE — ED Provider Notes (Signed)
10:00- the patient.  His daughter, is now here, and is inquiring about his disposition.  He is currently eating breakfast, and tolerating it well.  The patient states that both of his feet hurt.  He describes it as a "soreness."  He does not recall any specific trauma to his feet.  He describes having normal feeling/sensation of his feet.  On examination, he has lower leg edema bilaterally, 2+ extending to the feet, bilaterally.  There is a small area of bruising on the left plantar aspect of the great toe.  There are no other deformities of the feet.  The feet have mild diffuse tenderness, left greater than right.  Capillary refill is normal in both feet, bilaterally.  Patient will be sent for x-rays of the feet.  Medications  nystatin cream (MYCOSTATIN) ( Topical Given 02/06/14 0935)  aspirin EC tablet 81 mg (81 mg Oral Given 02/06/14 0935)  losartan (COZAAR) tablet 50 mg (50 mg Oral Given 02/06/14 0935)  furosemide (LASIX) tablet 20 mg (20 mg Oral Given 02/06/14 0935)  acetaminophen (TYLENOL) tablet 650 mg (650 mg Oral Given 02/06/14 0935)    Patient Vitals for the past 24 hrs:  BP Temp Temp src Pulse Resp SpO2  02/06/14 0900 118/59 mmHg - - - 18 -  02/06/14 0800 (!) 123/48 mmHg - - 99 17 100 %  02/06/14 0700 99/58 mmHg - - - 18 -  02/06/14 0600 (!) 107/48 mmHg - - 92 (!) 0 98 %  02/06/14 0539 - 99.5 F (37.5 C) Oral - - -  02/06/14 0500 (!) 117/51 mmHg - - 95 19 96 %  02/06/14 0200 99/57 mmHg - - - 15 -  02/06/14 0100 (!) 93/50 mmHg - - - 20 -  02/06/14 0030 (!) 108/54 mmHg - - - 12 -  02/06/14 0000 (!) 112/53 mmHg - - 93 20 98 %  02/05/14 2330 120/58 mmHg - - 97 13 98 %  02/05/14 2300 (!) 100/51 mmHg - - 103 19 100 %  02/05/14 2230 (!) 111/52 mmHg - - - 15 -  02/05/14 2200 128/74 mmHg - - - 23 -  02/05/14 2130 114/73 mmHg - - - 14 -  02/05/14 2100 117/61 mmHg - - - 14 -  02/05/14 2052 - 97.6 F (36.4 C) Rectal - - -  02/05/14 2030 130/64 mmHg - - 88 13 99 %  02/05/14 2015 - - - 84 (!)  7 100 %  02/05/14 2000 - - - 85 14 100 %  02/05/14 1945 - - - 66 13 100 %  02/05/14 1915 - - - 70 13 100 %  02/05/14 1747 - (!) 94.8 F (34.9 C) Rectal - - -  02/05/14 1735 (!) 117/33 mmHg 97.4 F (36.3 C) Oral 83 16 100 %    11:23 AM Reevaluation with update and discussion. After initial assessment and treatment, an updated evaluation reveals he remains comfortable.  There is no focal tenderness of the distal right fifth metacarpal, to indicate fracture.  Findings discussed with the patient and his daughter.  He has been taking Ultracet twice a day with Tylenol supplementation, for pain.  I have advised the patient and daughter to continue using this medication plan for pain.  He has been seen by social work.  They have asked me to initiate in-home assessment by home health with treatment.  I have completed a face-to-face evaluation in order for home health.  Findings discussed with patient, and daughter, and all questions  answered. Filbert Berthold, MD 02/06/14 1714

## 2014-02-06 NOTE — Clinical Social Work Note (Signed)
CSW received a call from ER nurse requesting pt's family was in need of a consult for possible placement.  Pt had taken a fall at home and was in the ER with his daughter.  CSW met with pt and his daughter at bedside and prompted family to discuss needs, services, history and financial information  Pt's daughter stated that pt lived with her and her husband and required some assistance in the home. Pt's daughter stated that her husband provides pt with bathing needs and pt can get himself to the bathroom and most times  dress himself.  Pt can also feed himself.   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  CSW provided resources to pt's daughter and spoke with the Md and RNCM about ordering PT, OT, Engineer, site and CSW services for pt in his home.  No further CSW needs identified.  Dede Query, LCSW [place] Worker - Weekend Coverage cell #: [phone]

## 2014-02-06 NOTE — ED Notes (Signed)
Daughter has arrived, Social worker Marcie Bal and Rollene Fare have been made aware of need for assistance for ? Placement. Dr Eulis Foster in to speak with daughter regarding POC.

## 2014-02-06 NOTE — Progress Notes (Signed)
CSW spoke with Fritz Pickerel the son in law who reports he was here last night but the patient was out of the room for testing.  He report is work 3am-2pm and his wife will be off work by [employer] this morning.  He will inform his wife that the patient is ready to by discharged from the emergency room.     Chesley Noon, MSW, LCSW, LCAS-A Evening Clinical Social Worker [phone]

## 2014-02-06 NOTE — Discharge Instructions (Signed)

## 2014-02-07 ENCOUNTER — Ambulatory Visit: Payer: Medicare Other | Admitting: Occupational Therapy

## 2014-02-10 ENCOUNTER — Encounter: Payer: Medicare Other | Admitting: Occupational Therapy

## 2014-02-15 ENCOUNTER — Telehealth: Payer: Self-pay | Admitting: *Deleted

## 2014-02-15 DIAGNOSIS — I10 Essential (primary) hypertension: Secondary | ICD-10-CM | POA: Diagnosis not present

## 2014-02-15 DIAGNOSIS — E119 Type 2 diabetes mellitus without complications: Secondary | ICD-10-CM | POA: Diagnosis not present

## 2014-02-15 DIAGNOSIS — Z9181 History of falling: Secondary | ICD-10-CM | POA: Diagnosis not present

## 2014-02-15 DIAGNOSIS — H5442 Blindness, left eye, normal vision right eye: Secondary | ICD-10-CM | POA: Diagnosis not present

## 2014-02-15 NOTE — Telephone Encounter (Signed)
[facility] called Pt is in need of OT- 2 times a week following ED visit for a fall. To help with mobility.  Pt is also in need of an aid for a few visits to help with ADL's  VO given to Mickel Baas for these services.

## 2014-02-15 NOTE — Telephone Encounter (Signed)
I would be happy to sign both of these just let me know what I need to do.

## 2014-02-16 NOTE — Telephone Encounter (Signed)
VO given. No signature needed.  Thank you Dr. Everlene Farrier.

## 2014-02-17 DIAGNOSIS — Z9181 History of falling: Secondary | ICD-10-CM | POA: Diagnosis not present

## 2014-02-17 DIAGNOSIS — H5442 Blindness, left eye, normal vision right eye: Secondary | ICD-10-CM | POA: Diagnosis not present

## 2014-02-17 DIAGNOSIS — E119 Type 2 diabetes mellitus without complications: Secondary | ICD-10-CM | POA: Diagnosis not present

## 2014-02-17 DIAGNOSIS — I10 Essential (primary) hypertension: Secondary | ICD-10-CM | POA: Diagnosis not present

## 2014-02-18 DIAGNOSIS — I10 Essential (primary) hypertension: Secondary | ICD-10-CM | POA: Diagnosis not present

## 2014-02-18 DIAGNOSIS — H5442 Blindness, left eye, normal vision right eye: Secondary | ICD-10-CM | POA: Diagnosis not present

## 2014-02-18 DIAGNOSIS — E119 Type 2 diabetes mellitus without complications: Secondary | ICD-10-CM | POA: Diagnosis not present

## 2014-02-18 DIAGNOSIS — Z9181 History of falling: Secondary | ICD-10-CM | POA: Diagnosis not present

## 2014-02-21 ENCOUNTER — Other Ambulatory Visit: Payer: Self-pay | Admitting: Emergency Medicine

## 2014-02-21 DIAGNOSIS — I10 Essential (primary) hypertension: Secondary | ICD-10-CM | POA: Diagnosis not present

## 2014-02-21 DIAGNOSIS — Z9181 History of falling: Secondary | ICD-10-CM | POA: Diagnosis not present

## 2014-02-21 DIAGNOSIS — E119 Type 2 diabetes mellitus without complications: Secondary | ICD-10-CM | POA: Diagnosis not present

## 2014-02-21 DIAGNOSIS — H5442 Blindness, left eye, normal vision right eye: Secondary | ICD-10-CM | POA: Diagnosis not present

## 2014-02-22 DIAGNOSIS — L89309 Pressure ulcer of unspecified buttock, unspecified stage: Secondary | ICD-10-CM | POA: Diagnosis not present

## 2014-02-22 DIAGNOSIS — S91109A Unspecified open wound of unspecified toe(s) without damage to nail, initial encounter: Secondary | ICD-10-CM | POA: Diagnosis not present

## 2014-02-23 DIAGNOSIS — Z9181 History of falling: Secondary | ICD-10-CM | POA: Diagnosis not present

## 2014-02-23 DIAGNOSIS — H5442 Blindness, left eye, normal vision right eye: Secondary | ICD-10-CM | POA: Diagnosis not present

## 2014-02-23 DIAGNOSIS — E119 Type 2 diabetes mellitus without complications: Secondary | ICD-10-CM | POA: Diagnosis not present

## 2014-02-23 DIAGNOSIS — I10 Essential (primary) hypertension: Secondary | ICD-10-CM | POA: Diagnosis not present

## 2014-02-25 DIAGNOSIS — M1711 Unilateral primary osteoarthritis, right knee: Secondary | ICD-10-CM | POA: Diagnosis not present

## 2014-02-25 DIAGNOSIS — E119 Type 2 diabetes mellitus without complications: Secondary | ICD-10-CM | POA: Diagnosis not present

## 2014-02-25 DIAGNOSIS — I10 Essential (primary) hypertension: Secondary | ICD-10-CM | POA: Diagnosis not present

## 2014-02-25 DIAGNOSIS — H5442 Blindness, left eye, normal vision right eye: Secondary | ICD-10-CM | POA: Diagnosis not present

## 2014-02-25 DIAGNOSIS — Z9181 History of falling: Secondary | ICD-10-CM | POA: Diagnosis not present

## 2014-02-27 DIAGNOSIS — H5442 Blindness, left eye, normal vision right eye: Secondary | ICD-10-CM | POA: Diagnosis not present

## 2014-02-27 DIAGNOSIS — I10 Essential (primary) hypertension: Secondary | ICD-10-CM | POA: Diagnosis not present

## 2014-02-27 DIAGNOSIS — Z9181 History of falling: Secondary | ICD-10-CM | POA: Diagnosis not present

## 2014-02-27 DIAGNOSIS — E119 Type 2 diabetes mellitus without complications: Secondary | ICD-10-CM | POA: Diagnosis not present

## 2014-03-01 DIAGNOSIS — E119 Type 2 diabetes mellitus without complications: Secondary | ICD-10-CM | POA: Diagnosis not present

## 2014-03-01 DIAGNOSIS — H5442 Blindness, left eye, normal vision right eye: Secondary | ICD-10-CM | POA: Diagnosis not present

## 2014-03-01 DIAGNOSIS — Z9181 History of falling: Secondary | ICD-10-CM | POA: Diagnosis not present

## 2014-03-01 DIAGNOSIS — I10 Essential (primary) hypertension: Secondary | ICD-10-CM | POA: Diagnosis not present

## 2014-03-02 ENCOUNTER — Telehealth: Payer: Self-pay

## 2014-03-02 DIAGNOSIS — H5442 Blindness, left eye, normal vision right eye: Secondary | ICD-10-CM | POA: Diagnosis not present

## 2014-03-02 DIAGNOSIS — E119 Type 2 diabetes mellitus without complications: Secondary | ICD-10-CM | POA: Diagnosis not present

## 2014-03-02 DIAGNOSIS — Z9181 History of falling: Secondary | ICD-10-CM | POA: Diagnosis not present

## 2014-03-02 DIAGNOSIS — I10 Essential (primary) hypertension: Secondary | ICD-10-CM | POA: Diagnosis not present

## 2014-03-02 NOTE — Telephone Encounter (Signed)
Akiko needs a  verbal order to continue physical therapy from [facility]  with Mr. Paone. Pease advise at  [phone]

## 2014-03-02 NOTE — Telephone Encounter (Signed)
Spoke with Jewel Baize, gave verbal order to have PT and [facility].

## 2014-03-04 DIAGNOSIS — Z9181 History of falling: Secondary | ICD-10-CM | POA: Diagnosis not present

## 2014-03-04 DIAGNOSIS — M1711 Unilateral primary osteoarthritis, right knee: Secondary | ICD-10-CM | POA: Diagnosis not present

## 2014-03-04 DIAGNOSIS — I10 Essential (primary) hypertension: Secondary | ICD-10-CM | POA: Diagnosis not present

## 2014-03-04 DIAGNOSIS — E119 Type 2 diabetes mellitus without complications: Secondary | ICD-10-CM | POA: Diagnosis not present

## 2014-03-04 DIAGNOSIS — H5442 Blindness, left eye, normal vision right eye: Secondary | ICD-10-CM | POA: Diagnosis not present

## 2014-03-07 DIAGNOSIS — H5442 Blindness, left eye, normal vision right eye: Secondary | ICD-10-CM | POA: Diagnosis not present

## 2014-03-07 DIAGNOSIS — E119 Type 2 diabetes mellitus without complications: Secondary | ICD-10-CM | POA: Diagnosis not present

## 2014-03-07 DIAGNOSIS — Z9181 History of falling: Secondary | ICD-10-CM | POA: Diagnosis not present

## 2014-03-07 DIAGNOSIS — I10 Essential (primary) hypertension: Secondary | ICD-10-CM | POA: Diagnosis not present

## 2014-03-08 DIAGNOSIS — H5442 Blindness, left eye, normal vision right eye: Secondary | ICD-10-CM | POA: Diagnosis not present

## 2014-03-08 DIAGNOSIS — I10 Essential (primary) hypertension: Secondary | ICD-10-CM | POA: Diagnosis not present

## 2014-03-08 DIAGNOSIS — Z9181 History of falling: Secondary | ICD-10-CM | POA: Diagnosis not present

## 2014-03-08 DIAGNOSIS — E119 Type 2 diabetes mellitus without complications: Secondary | ICD-10-CM | POA: Diagnosis not present

## 2014-03-09 DIAGNOSIS — I10 Essential (primary) hypertension: Secondary | ICD-10-CM | POA: Diagnosis not present

## 2014-03-09 DIAGNOSIS — Z9181 History of falling: Secondary | ICD-10-CM | POA: Diagnosis not present

## 2014-03-09 DIAGNOSIS — E119 Type 2 diabetes mellitus without complications: Secondary | ICD-10-CM | POA: Diagnosis not present

## 2014-03-09 DIAGNOSIS — H5442 Blindness, left eye, normal vision right eye: Secondary | ICD-10-CM | POA: Diagnosis not present

## 2014-03-10 ENCOUNTER — Telehealth: Payer: Self-pay | Admitting: *Deleted

## 2014-03-10 DIAGNOSIS — H5442 Blindness, left eye, normal vision right eye: Secondary | ICD-10-CM | POA: Diagnosis not present

## 2014-03-10 DIAGNOSIS — Z9181 History of falling: Secondary | ICD-10-CM | POA: Diagnosis not present

## 2014-03-10 DIAGNOSIS — I10 Essential (primary) hypertension: Secondary | ICD-10-CM | POA: Diagnosis not present

## 2014-03-10 DIAGNOSIS — E119 Type 2 diabetes mellitus without complications: Secondary | ICD-10-CM | POA: Diagnosis not present

## 2014-03-10 NOTE — Telephone Encounter (Signed)
Faxed signed order for PT to [facility], per Dr Everlene Farrier. Confirmation page received at 11:37 am.

## 2014-03-11 DIAGNOSIS — M1711 Unilateral primary osteoarthritis, right knee: Secondary | ICD-10-CM | POA: Diagnosis not present

## 2014-03-11 DIAGNOSIS — I10 Essential (primary) hypertension: Secondary | ICD-10-CM | POA: Diagnosis not present

## 2014-03-11 DIAGNOSIS — Z9181 History of falling: Secondary | ICD-10-CM | POA: Diagnosis not present

## 2014-03-11 DIAGNOSIS — H5442 Blindness, left eye, normal vision right eye: Secondary | ICD-10-CM | POA: Diagnosis not present

## 2014-03-11 DIAGNOSIS — E119 Type 2 diabetes mellitus without complications: Secondary | ICD-10-CM | POA: Diagnosis not present

## 2014-03-14 DIAGNOSIS — E119 Type 2 diabetes mellitus without complications: Secondary | ICD-10-CM | POA: Diagnosis not present

## 2014-03-14 DIAGNOSIS — Z9181 History of falling: Secondary | ICD-10-CM | POA: Diagnosis not present

## 2014-03-14 DIAGNOSIS — I10 Essential (primary) hypertension: Secondary | ICD-10-CM | POA: Diagnosis not present

## 2014-03-14 DIAGNOSIS — H5442 Blindness, left eye, normal vision right eye: Secondary | ICD-10-CM | POA: Diagnosis not present

## 2014-03-15 DIAGNOSIS — I10 Essential (primary) hypertension: Secondary | ICD-10-CM | POA: Diagnosis not present

## 2014-03-15 DIAGNOSIS — E119 Type 2 diabetes mellitus without complications: Secondary | ICD-10-CM | POA: Diagnosis not present

## 2014-03-15 DIAGNOSIS — Z9181 History of falling: Secondary | ICD-10-CM | POA: Diagnosis not present

## 2014-03-15 DIAGNOSIS — H5442 Blindness, left eye, normal vision right eye: Secondary | ICD-10-CM | POA: Diagnosis not present

## 2014-03-16 ENCOUNTER — Telehealth: Payer: Self-pay

## 2014-03-16 DIAGNOSIS — I10 Essential (primary) hypertension: Secondary | ICD-10-CM | POA: Diagnosis not present

## 2014-03-16 DIAGNOSIS — E119 Type 2 diabetes mellitus without complications: Secondary | ICD-10-CM | POA: Diagnosis not present

## 2014-03-16 DIAGNOSIS — Z9181 History of falling: Secondary | ICD-10-CM | POA: Diagnosis not present

## 2014-03-16 DIAGNOSIS — H5442 Blindness, left eye, normal vision right eye: Secondary | ICD-10-CM | POA: Diagnosis not present

## 2014-03-16 NOTE — Telephone Encounter (Signed)
[facility] wants to extend PT 3 weeks for two times a week.  [phone]

## 2014-03-16 NOTE — Telephone Encounter (Signed)
I would say this is ok? Please advise.

## 2014-03-17 DIAGNOSIS — I10 Essential (primary) hypertension: Secondary | ICD-10-CM | POA: Diagnosis not present

## 2014-03-17 DIAGNOSIS — E119 Type 2 diabetes mellitus without complications: Secondary | ICD-10-CM | POA: Diagnosis not present

## 2014-03-17 DIAGNOSIS — Z9181 History of falling: Secondary | ICD-10-CM | POA: Diagnosis not present

## 2014-03-17 DIAGNOSIS — H5442 Blindness, left eye, normal vision right eye: Secondary | ICD-10-CM | POA: Diagnosis not present

## 2014-03-17 NOTE — Telephone Encounter (Signed)
Left a detailed message letting Belenda Cruise know it is ok to extend PT.

## 2014-03-17 NOTE — Telephone Encounter (Signed)
That would be great please continue.

## 2014-03-18 DIAGNOSIS — E119 Type 2 diabetes mellitus without complications: Secondary | ICD-10-CM | POA: Diagnosis not present

## 2014-03-18 DIAGNOSIS — H5442 Blindness, left eye, normal vision right eye: Secondary | ICD-10-CM | POA: Diagnosis not present

## 2014-03-18 DIAGNOSIS — Z9181 History of falling: Secondary | ICD-10-CM | POA: Diagnosis not present

## 2014-03-18 DIAGNOSIS — I10 Essential (primary) hypertension: Secondary | ICD-10-CM | POA: Diagnosis not present

## 2014-03-22 DIAGNOSIS — E119 Type 2 diabetes mellitus without complications: Secondary | ICD-10-CM | POA: Diagnosis not present

## 2014-03-22 DIAGNOSIS — I10 Essential (primary) hypertension: Secondary | ICD-10-CM | POA: Diagnosis not present

## 2014-03-22 DIAGNOSIS — H5442 Blindness, left eye, normal vision right eye: Secondary | ICD-10-CM | POA: Diagnosis not present

## 2014-03-22 DIAGNOSIS — Z9181 History of falling: Secondary | ICD-10-CM | POA: Diagnosis not present

## 2014-03-23 DIAGNOSIS — I10 Essential (primary) hypertension: Secondary | ICD-10-CM | POA: Diagnosis not present

## 2014-03-23 DIAGNOSIS — Z9181 History of falling: Secondary | ICD-10-CM | POA: Diagnosis not present

## 2014-03-23 DIAGNOSIS — H5442 Blindness, left eye, normal vision right eye: Secondary | ICD-10-CM | POA: Diagnosis not present

## 2014-03-23 DIAGNOSIS — E119 Type 2 diabetes mellitus without complications: Secondary | ICD-10-CM | POA: Diagnosis not present

## 2014-03-24 DIAGNOSIS — Z9181 History of falling: Secondary | ICD-10-CM | POA: Diagnosis not present

## 2014-03-24 DIAGNOSIS — E119 Type 2 diabetes mellitus without complications: Secondary | ICD-10-CM | POA: Diagnosis not present

## 2014-03-24 DIAGNOSIS — H5442 Blindness, left eye, normal vision right eye: Secondary | ICD-10-CM | POA: Diagnosis not present

## 2014-03-24 DIAGNOSIS — I10 Essential (primary) hypertension: Secondary | ICD-10-CM | POA: Diagnosis not present

## 2014-03-25 DIAGNOSIS — I10 Essential (primary) hypertension: Secondary | ICD-10-CM | POA: Diagnosis not present

## 2014-03-25 DIAGNOSIS — E119 Type 2 diabetes mellitus without complications: Secondary | ICD-10-CM | POA: Diagnosis not present

## 2014-03-25 DIAGNOSIS — H5442 Blindness, left eye, normal vision right eye: Secondary | ICD-10-CM | POA: Diagnosis not present

## 2014-03-25 DIAGNOSIS — Z9181 History of falling: Secondary | ICD-10-CM | POA: Diagnosis not present

## 2014-03-28 DIAGNOSIS — H1812 Bullous keratopathy, left eye: Secondary | ICD-10-CM | POA: Diagnosis not present

## 2014-03-28 DIAGNOSIS — H1712 Central corneal opacity, left eye: Secondary | ICD-10-CM | POA: Diagnosis not present

## 2014-03-28 DIAGNOSIS — H02831 Dermatochalasis of right upper eyelid: Secondary | ICD-10-CM | POA: Diagnosis not present

## 2014-03-28 DIAGNOSIS — H2703 Aphakia, bilateral: Secondary | ICD-10-CM | POA: Diagnosis not present

## 2014-03-28 DIAGNOSIS — H40013 Open angle with borderline findings, low risk, bilateral: Secondary | ICD-10-CM | POA: Diagnosis not present

## 2014-03-29 ENCOUNTER — Encounter: Payer: Self-pay | Admitting: Emergency Medicine

## 2014-03-29 ENCOUNTER — Ambulatory Visit (INDEPENDENT_AMBULATORY_CARE_PROVIDER_SITE_OTHER): Payer: Medicare Other | Admitting: Emergency Medicine

## 2014-03-29 VITALS — BP 117/75 | HR 76 | Temp 97.2°F | Resp 16 | Ht 65.0 in | Wt 219.0 lb

## 2014-03-29 DIAGNOSIS — H540 Blindness, both eyes: Secondary | ICD-10-CM

## 2014-03-29 DIAGNOSIS — I10 Essential (primary) hypertension: Secondary | ICD-10-CM

## 2014-03-29 DIAGNOSIS — R269 Unspecified abnormalities of gait and mobility: Secondary | ICD-10-CM

## 2014-03-29 DIAGNOSIS — H5442 Blindness, left eye, normal vision right eye: Secondary | ICD-10-CM | POA: Diagnosis not present

## 2014-03-29 DIAGNOSIS — E119 Type 2 diabetes mellitus without complications: Secondary | ICD-10-CM | POA: Diagnosis not present

## 2014-03-29 DIAGNOSIS — H547 Unspecified visual loss: Secondary | ICD-10-CM

## 2014-03-29 DIAGNOSIS — R739 Hyperglycemia, unspecified: Secondary | ICD-10-CM | POA: Diagnosis not present

## 2014-03-29 DIAGNOSIS — Z9181 History of falling: Secondary | ICD-10-CM | POA: Diagnosis not present

## 2014-03-29 LAB — GLUCOSE, POCT (MANUAL RESULT ENTRY): POC Glucose: 123 mg/dl — AB (ref 70–99)

## 2014-03-29 LAB — POCT GLYCOSYLATED HEMOGLOBIN (HGB A1C): Hemoglobin A1C: 6.3

## 2014-03-29 NOTE — Progress Notes (Deleted)
   Subjective:    Patient ID: Curtis Brock, male    DOB: Nov 10, 1925, 81 y.o.   MRN: [id number]  HPI    Review of Systems     Objective:   Physical Exam        Assessment & Plan:

## 2014-03-29 NOTE — Progress Notes (Deleted)
   Subjective:    Patient ID: Curtis Brock, male    DOB: 1925-09-21, 81 y.o.   MRN: [id number]  HPI    Review of Systems     Objective:   Physical Exam        Assessment & Plan:

## 2014-03-29 NOTE — Progress Notes (Addendum)
Subjective:  This chart was scribed for Darlyne Russian, MD by Ladene Artist, ED Scribe. The patient was seen in room 21. Patient's care was started at 4:08 PM.   Patient ID: Curtis Brock, male    DOB: [birth year], 81 y.o.   MRN: [id number]  Chief Complaint  Patient presents with  . Follow-up  . Hypertension  . Medication Refill   HPI HPI Comments: Curtis Brock is a 81 y.o. male, with a h/o DM, HTN, L eye blindness, who presents to the [facility] for a follow-up regarding HTN. Pt reports that he is doing well overall. He states that he occasionally does home exercises. His son reports that pt has been walking more with home aids, but reports that this is their last week with him. Pt requests home health for ADL's such as bathing.   Diabetes Pt states that his blood glucose has been stable. Pt's son reports that pt has been eating more sweets lately. Pt does not express any concerns at this time.   Pt was seen in the ED at [facility] in January following a fall.   Past Medical History  Diagnosis Date  . Diabetes mellitus without complication   . Hypertension   . Blind left eye    Current Outpatient Prescriptions on File Prior to Visit  Medication Sig Dispense Refill  . acetaminophen (TYLENOL) 325 MG tablet Take 2 tablets (650 mg total) by mouth every 6 (six) hours as needed for mild pain or moderate pain (or Fever >/= 101).    [name] aspirin EC 81 MG tablet Take 81 mg by mouth daily.    . cyanocobalamin (,VITAMIN B-12,) 1000 MCG/ML injection INJECT 1 ML AS DIRECTED EVERY MONTH 10 mL 0  . docusate sodium 100 MG CAPS Take 100 mg by mouth 2 (two) times daily. 10 capsule 0  . furosemide (LASIX) 20 MG tablet TAKE 1 TABLET (20 MG TOTAL) BY MOUTH DAILY. 90 tablet 0  . losartan (COZAAR) 50 MG tablet TAKE ONE TABLET BY MOUTH ONCE DAILY 30 tablet 4  . traMADol-acetaminophen (ULTRACET) 37.5-325 MG per tablet Take 1 tablet by mouth 2 (two) times daily.    [name] zoster vaccine live,  PF, (ZOSTAVAX) 09811 UNT/0.65ML injection Inject 19,400 Units into the skin once. (Patient not taking: Reported on 02/06/2014) 1 each 0   No current facility-administered medications on file prior to visit.   No Known Allergies  Review of Systems     Objective:   Physical Exam CONSTITUTIONAL: Well developed/well nourished HEAD: Normocephalic/atraumatic, significant visual impairment  EYES: EOMI/PERRL ENMT: Mucous membranes moist NECK: supple no meningeal signs SPINE/BACK:entire spine nontender CV: S1/S2 noted, no murmurs/rubs/gallops noted LUNGS: Lungs are clear to auscultation bilaterally, no apparent distress ABDOMEN: soft, nontender, no rebound or guarding, bowel sounds noted throughout abdomen GU:no cva tenderness NEURO: Pt is awake/alert/appropriate, moves all extremitiesx4. No facial droop. Ambulates with assistant of walker with very unstable wide gait. EXTREMITIES: pulses normal/equal, full ROM, Decreased sensation of lower extremities secondary to neuropathy. SKIN: warm, color normal PSYCH: no abnormalities of mood noted, alert and oriented to situation    Results for orders placed or performed in visit on 03/29/14  POCT glycosylated hemoglobin (Hb A1C)  Result Value Ref Range   Hemoglobin A1C 6.3   POCT glucose (manual entry)  Result Value Ref Range   POC Glucose 123 (A) 70 - 99 mg/dl   Assessment & Plan:  Diabetes at goal . blood pressure well controlled  recheck 3-4 months. We'll see if we taking get home health to do more to help with his ADLs. He was encouraged to increase his exercise.I personally performed the services described in this documentation, which was scribed in my presence. The recorded information has been reviewed and is accurate.

## 2014-03-30 ENCOUNTER — Other Ambulatory Visit: Payer: Self-pay

## 2014-03-30 DIAGNOSIS — H5442 Blindness, left eye, normal vision right eye: Secondary | ICD-10-CM | POA: Diagnosis not present

## 2014-03-30 DIAGNOSIS — I10 Essential (primary) hypertension: Secondary | ICD-10-CM | POA: Diagnosis not present

## 2014-03-30 DIAGNOSIS — Z9181 History of falling: Secondary | ICD-10-CM | POA: Diagnosis not present

## 2014-03-30 DIAGNOSIS — E119 Type 2 diabetes mellitus without complications: Secondary | ICD-10-CM | POA: Diagnosis not present

## 2014-03-30 MED ORDER — LOSARTAN POTASSIUM 50 MG PO TABS: 50.0000 mg | ORAL_TABLET | Freq: Every day | ORAL | Status: DC

## 2014-03-30 MED ORDER — FUROSEMIDE 20 MG PO TABS: ORAL_TABLET | ORAL | Status: DC

## 2014-03-31 DIAGNOSIS — I10 Essential (primary) hypertension: Secondary | ICD-10-CM | POA: Diagnosis not present

## 2014-03-31 DIAGNOSIS — Z9181 History of falling: Secondary | ICD-10-CM | POA: Diagnosis not present

## 2014-03-31 DIAGNOSIS — E119 Type 2 diabetes mellitus without complications: Secondary | ICD-10-CM | POA: Diagnosis not present

## 2014-03-31 DIAGNOSIS — H5442 Blindness, left eye, normal vision right eye: Secondary | ICD-10-CM | POA: Diagnosis not present

## 2014-04-01 DIAGNOSIS — Z9181 History of falling: Secondary | ICD-10-CM | POA: Diagnosis not present

## 2014-04-01 DIAGNOSIS — I10 Essential (primary) hypertension: Secondary | ICD-10-CM | POA: Diagnosis not present

## 2014-04-01 DIAGNOSIS — E119 Type 2 diabetes mellitus without complications: Secondary | ICD-10-CM | POA: Diagnosis not present

## 2014-04-01 DIAGNOSIS — H5442 Blindness, left eye, normal vision right eye: Secondary | ICD-10-CM | POA: Diagnosis not present

## 2014-04-04 ENCOUNTER — Telehealth: Payer: Self-pay

## 2014-04-04 NOTE — Telephone Encounter (Signed)
Curtis Brock at [facility] has questions regarding an order for patient to have nursing assitants. She is wanting to change it to Physical therapy. Call back number is [phone]

## 2014-04-05 DIAGNOSIS — I10 Essential (primary) hypertension: Secondary | ICD-10-CM | POA: Diagnosis not present

## 2014-04-05 DIAGNOSIS — E119 Type 2 diabetes mellitus without complications: Secondary | ICD-10-CM | POA: Diagnosis not present

## 2014-04-05 DIAGNOSIS — Z9181 History of falling: Secondary | ICD-10-CM | POA: Diagnosis not present

## 2014-04-05 DIAGNOSIS — H5442 Blindness, left eye, normal vision right eye: Secondary | ICD-10-CM | POA: Diagnosis not present

## 2014-04-06 ENCOUNTER — Telehealth: Payer: Self-pay

## 2014-04-06 NOTE — Telephone Encounter (Signed)
It is fine to change it to physical therapy. He does have needs for assistance with ADLs such as dressing and  Bathing. He also has significant visual impairment

## 2014-04-06 NOTE — Telephone Encounter (Signed)
Message was copied and pasted to the right chart. This is a duplicate account.

## 2014-04-06 NOTE — Telephone Encounter (Signed)
Perlie Gold at 04/04/2014 4:18 PM     Status: Signed       Expand All Collapse All   Zigmund Daniel at [facility] has questions regarding an order for patient to have nursing assitants. She is wanting to change it to Physical therapy. Call back number is [phone]         Can we change. Please advise so I can call Zigmund Daniel.

## 2014-04-06 NOTE — Telephone Encounter (Signed)
Tried to call, closed will call tomorrow.

## 2014-04-07 DIAGNOSIS — Z9181 History of falling: Secondary | ICD-10-CM | POA: Diagnosis not present

## 2014-04-07 DIAGNOSIS — H5442 Blindness, left eye, normal vision right eye: Secondary | ICD-10-CM | POA: Diagnosis not present

## 2014-04-07 DIAGNOSIS — I10 Essential (primary) hypertension: Secondary | ICD-10-CM | POA: Diagnosis not present

## 2014-04-07 DIAGNOSIS — E119 Type 2 diabetes mellitus without complications: Secondary | ICD-10-CM | POA: Diagnosis not present

## 2014-04-07 NOTE — Telephone Encounter (Signed)
Spoke with Jenny Reichmann, advised ok to continue PT.

## 2014-04-12 DIAGNOSIS — E119 Type 2 diabetes mellitus without complications: Secondary | ICD-10-CM | POA: Diagnosis not present

## 2014-04-12 DIAGNOSIS — Z9181 History of falling: Secondary | ICD-10-CM | POA: Diagnosis not present

## 2014-04-12 DIAGNOSIS — I10 Essential (primary) hypertension: Secondary | ICD-10-CM | POA: Diagnosis not present

## 2014-04-12 DIAGNOSIS — H5442 Blindness, left eye, normal vision right eye: Secondary | ICD-10-CM | POA: Diagnosis not present

## 2014-04-22 ENCOUNTER — Telehealth: Payer: Self-pay

## 2014-04-22 NOTE — Telephone Encounter (Signed)
Received fax from [facility] regarding this pt and PT. Seems like they have been trying to get in touch with pt to set up an appt for PT without any success. LMOM on both pt's number and daughter's number to CB. Form is in nurse's box. Send to scan after we talk with pt

## 2014-05-01 ENCOUNTER — Encounter (HOSPITAL_COMMUNITY): Payer: Self-pay | Admitting: *Deleted

## 2014-05-01 ENCOUNTER — Inpatient Hospital Stay (HOSPITAL_COMMUNITY): Payer: Medicare Other

## 2014-05-01 ENCOUNTER — Emergency Department (HOSPITAL_COMMUNITY): Payer: Medicare Other

## 2014-05-01 ENCOUNTER — Other Ambulatory Visit (HOSPITAL_COMMUNITY): Payer: Self-pay

## 2014-05-01 ENCOUNTER — Inpatient Hospital Stay (HOSPITAL_COMMUNITY)
Admission: EM | Admit: 2014-05-01 | Discharge: 2014-05-24 | DRG: 682 | Disposition: A | Discharge status: Skilled Nursing Facility | Payer: Medicare Other | Attending: Internal Medicine | Admitting: Internal Medicine

## 2014-05-01 DIAGNOSIS — G9341 Metabolic encephalopathy: Secondary | ICD-10-CM | POA: Diagnosis present

## 2014-05-01 DIAGNOSIS — R579 Shock, unspecified: Secondary | ICD-10-CM | POA: Diagnosis present

## 2014-05-01 DIAGNOSIS — R680 Hypothermia, not associated with low environmental temperature: Secondary | ICD-10-CM | POA: Diagnosis present

## 2014-05-01 DIAGNOSIS — E11621 Type 2 diabetes mellitus with foot ulcer: Secondary | ICD-10-CM | POA: Diagnosis not present

## 2014-05-01 DIAGNOSIS — E1165 Type 2 diabetes mellitus with hyperglycemia: Secondary | ICD-10-CM | POA: Diagnosis present

## 2014-05-01 DIAGNOSIS — Z6841 Body Mass Index (BMI) 40.0 and over, adult: Secondary | ICD-10-CM | POA: Diagnosis not present

## 2014-05-01 DIAGNOSIS — I472 Ventricular tachycardia, unspecified: Secondary | ICD-10-CM

## 2014-05-01 DIAGNOSIS — R6511 Systemic inflammatory response syndrome (SIRS) of non-infectious origin with acute organ dysfunction: Secondary | ICD-10-CM | POA: Diagnosis present

## 2014-05-01 DIAGNOSIS — R4182 Altered mental status, unspecified: Secondary | ICD-10-CM | POA: Diagnosis not present

## 2014-05-01 DIAGNOSIS — I120 Hypertensive chronic kidney disease with stage 5 chronic kidney disease or end stage renal disease: Secondary | ICD-10-CM | POA: Diagnosis not present

## 2014-05-01 DIAGNOSIS — R34 Anuria and oliguria: Secondary | ICD-10-CM | POA: Diagnosis not present

## 2014-05-01 DIAGNOSIS — Z79899 Other long term (current) drug therapy: Secondary | ICD-10-CM

## 2014-05-01 DIAGNOSIS — K429 Umbilical hernia without obstruction or gangrene: Secondary | ICD-10-CM | POA: Diagnosis not present

## 2014-05-01 DIAGNOSIS — M6281 Muscle weakness (generalized): Secondary | ICD-10-CM | POA: Diagnosis not present

## 2014-05-01 DIAGNOSIS — R1011 Right upper quadrant pain: Secondary | ICD-10-CM

## 2014-05-01 DIAGNOSIS — I6990 Unspecified sequelae of unspecified cerebrovascular disease: Secondary | ICD-10-CM | POA: Diagnosis not present

## 2014-05-01 DIAGNOSIS — I272 Other secondary pulmonary hypertension: Secondary | ICD-10-CM | POA: Diagnosis not present

## 2014-05-01 DIAGNOSIS — N186 End stage renal disease: Secondary | ICD-10-CM | POA: Diagnosis present

## 2014-05-01 DIAGNOSIS — Z992 Dependence on renal dialysis: Secondary | ICD-10-CM

## 2014-05-01 DIAGNOSIS — H5442 Blindness, left eye, normal vision right eye: Secondary | ICD-10-CM | POA: Diagnosis present

## 2014-05-01 DIAGNOSIS — R059 Cough, unspecified: Secondary | ICD-10-CM

## 2014-05-01 DIAGNOSIS — I10 Essential (primary) hypertension: Secondary | ICD-10-CM | POA: Diagnosis not present

## 2014-05-01 DIAGNOSIS — Z66 Do not resuscitate: Secondary | ICD-10-CM | POA: Diagnosis present

## 2014-05-01 DIAGNOSIS — N179 Acute kidney failure, unspecified: Secondary | ICD-10-CM | POA: Diagnosis present

## 2014-05-01 DIAGNOSIS — E669 Obesity, unspecified: Secondary | ICD-10-CM | POA: Diagnosis not present

## 2014-05-01 DIAGNOSIS — T68XXXA Hypothermia, initial encounter: Secondary | ICD-10-CM | POA: Diagnosis not present

## 2014-05-01 DIAGNOSIS — A419 Sepsis, unspecified organism: Secondary | ICD-10-CM | POA: Diagnosis not present

## 2014-05-01 DIAGNOSIS — K5900 Constipation, unspecified: Secondary | ICD-10-CM | POA: Diagnosis not present

## 2014-05-01 DIAGNOSIS — R7989 Other specified abnormal findings of blood chemistry: Secondary | ICD-10-CM | POA: Diagnosis not present

## 2014-05-01 DIAGNOSIS — N170 Acute kidney failure with tubular necrosis: Principal | ICD-10-CM | POA: Diagnosis present

## 2014-05-01 DIAGNOSIS — R1310 Dysphagia, unspecified: Secondary | ICD-10-CM

## 2014-05-01 DIAGNOSIS — E872 Acidosis, unspecified: Secondary | ICD-10-CM | POA: Diagnosis present

## 2014-05-01 DIAGNOSIS — E785 Hyperlipidemia, unspecified: Secondary | ICD-10-CM | POA: Diagnosis present

## 2014-05-01 DIAGNOSIS — R05 Cough: Secondary | ICD-10-CM

## 2014-05-01 DIAGNOSIS — Z8249 Family history of ischemic heart disease and other diseases of the circulatory system: Secondary | ICD-10-CM

## 2014-05-01 DIAGNOSIS — R000 Tachycardia, unspecified: Secondary | ICD-10-CM | POA: Diagnosis not present

## 2014-05-01 DIAGNOSIS — M545 Low back pain, unspecified: Secondary | ICD-10-CM | POA: Diagnosis present

## 2014-05-01 DIAGNOSIS — E875 Hyperkalemia: Secondary | ICD-10-CM | POA: Diagnosis present

## 2014-05-01 DIAGNOSIS — R945 Abnormal results of liver function studies: Secondary | ICD-10-CM

## 2014-05-01 DIAGNOSIS — R410 Disorientation, unspecified: Secondary | ICD-10-CM | POA: Diagnosis not present

## 2014-05-01 DIAGNOSIS — B369 Superficial mycosis, unspecified: Secondary | ICD-10-CM | POA: Diagnosis not present

## 2014-05-01 DIAGNOSIS — F99 Mental disorder, not otherwise specified: Secondary | ICD-10-CM | POA: Diagnosis not present

## 2014-05-01 DIAGNOSIS — Z8673 Personal history of transient ischemic attack (TIA), and cerebral infarction without residual deficits: Secondary | ICD-10-CM

## 2014-05-01 DIAGNOSIS — R6510 Systemic inflammatory response syndrome (SIRS) of non-infectious origin without acute organ dysfunction: Secondary | ICD-10-CM | POA: Diagnosis present

## 2014-05-01 DIAGNOSIS — E119 Type 2 diabetes mellitus without complications: Secondary | ICD-10-CM | POA: Diagnosis present

## 2014-05-01 DIAGNOSIS — K5730 Diverticulosis of large intestine without perforation or abscess without bleeding: Secondary | ICD-10-CM | POA: Diagnosis not present

## 2014-05-01 DIAGNOSIS — R1311 Dysphagia, oral phase: Secondary | ICD-10-CM | POA: Diagnosis not present

## 2014-05-01 DIAGNOSIS — D638 Anemia in other chronic diseases classified elsewhere: Secondary | ICD-10-CM | POA: Diagnosis present

## 2014-05-01 DIAGNOSIS — R4689 Other symptoms and signs involving appearance and behavior: Secondary | ICD-10-CM | POA: Diagnosis not present

## 2014-05-01 DIAGNOSIS — I639 Cerebral infarction, unspecified: Secondary | ICD-10-CM | POA: Diagnosis present

## 2014-05-01 DIAGNOSIS — G9340 Encephalopathy, unspecified: Secondary | ICD-10-CM | POA: Diagnosis present

## 2014-05-01 DIAGNOSIS — I69952 Hemiplegia and hemiparesis following unspecified cerebrovascular disease affecting left dominant side: Secondary | ICD-10-CM | POA: Diagnosis not present

## 2014-05-01 DIAGNOSIS — K8020 Calculus of gallbladder without cholecystitis without obstruction: Secondary | ICD-10-CM | POA: Diagnosis not present

## 2014-05-01 DIAGNOSIS — L89159 Pressure ulcer of sacral region, unspecified stage: Secondary | ICD-10-CM | POA: Diagnosis present

## 2014-05-01 DIAGNOSIS — E118 Type 2 diabetes mellitus with unspecified complications: Secondary | ICD-10-CM | POA: Diagnosis not present

## 2014-05-01 DIAGNOSIS — I4901 Ventricular fibrillation: Secondary | ICD-10-CM | POA: Diagnosis not present

## 2014-05-01 DIAGNOSIS — R40241 Glasgow coma scale score 13-15: Secondary | ICD-10-CM | POA: Diagnosis not present

## 2014-05-01 DIAGNOSIS — Z7982 Long term (current) use of aspirin: Secondary | ICD-10-CM | POA: Diagnosis not present

## 2014-05-01 DIAGNOSIS — L89152 Pressure ulcer of sacral region, stage 2: Secondary | ICD-10-CM | POA: Diagnosis not present

## 2014-05-01 DIAGNOSIS — R262 Difficulty in walking, not elsewhere classified: Secondary | ICD-10-CM | POA: Diagnosis not present

## 2014-05-01 DIAGNOSIS — Z452 Encounter for adjustment and management of vascular access device: Secondary | ICD-10-CM | POA: Diagnosis not present

## 2014-05-01 DIAGNOSIS — H548 Legal blindness, as defined in USA: Secondary | ICD-10-CM | POA: Diagnosis not present

## 2014-05-01 DIAGNOSIS — R8299 Other abnormal findings in urine: Secondary | ICD-10-CM | POA: Diagnosis not present

## 2014-05-01 LAB — URINALYSIS, ROUTINE W REFLEX MICROSCOPIC
BILIRUBIN URINE: NEGATIVE
Glucose, UA: NEGATIVE mg/dL
Hgb urine dipstick: NEGATIVE
KETONES UR: NEGATIVE mg/dL
LEUKOCYTES UA: NEGATIVE
NITRITE: NEGATIVE
Protein, ur: NEGATIVE mg/dL
SPECIFIC GRAVITY, URINE: 1.017 (ref 1.005–1.030)
Urobilinogen, UA: 0.2 mg/dL (ref 0.0–1.0)
pH: 5.5 (ref 5.0–8.0)

## 2014-05-01 LAB — BLOOD GAS, ARTERIAL
Acid-base deficit: 2.9 mmol/L — ABNORMAL HIGH (ref 0.0–2.0)
BICARBONATE: 21.7 meq/L (ref 20.0–24.0)
Drawn by: 257701
FIO2: 0.21 %
O2 Saturation: 95.3 %
PH ART: 7.389 (ref 7.350–7.450)
Patient temperature: 95
TCO2: 19.4 mmol/L (ref 0–100)
pCO2 arterial: 35.7 mmHg (ref 35.0–45.0)
pO2, Arterial: 79.7 mmHg — ABNORMAL LOW (ref 80.0–100.0)

## 2014-05-01 LAB — COMPREHENSIVE METABOLIC PANEL
ALT: 105 U/L — ABNORMAL HIGH (ref 0–53)
AST: 284 U/L — AB (ref 0–37)
Albumin: 3.7 g/dL (ref 3.5–5.2)
Alkaline Phosphatase: 82 U/L (ref 39–117)
Anion gap: 13 (ref 5–15)
BILIRUBIN TOTAL: 0.5 mg/dL (ref 0.3–1.2)
BUN: 43 mg/dL — ABNORMAL HIGH (ref 6–23)
CALCIUM: 9.4 mg/dL (ref 8.4–10.5)
CO2: 22 mmol/L (ref 19–32)
Chloride: 101 mmol/L (ref 96–112)
Creatinine, Ser: 2.02 mg/dL — ABNORMAL HIGH (ref 0.50–1.35)
GFR calc Af Amer: 33 mL/min — ABNORMAL LOW (ref >90)
GFR calc non Af Amer: 28 mL/min — ABNORMAL LOW (ref >90)
Glucose, Bld: 203 mg/dL — ABNORMAL HIGH (ref 70–99)
POTASSIUM: 4.8 mmol/L (ref 3.5–5.1)
Sodium: 136 mmol/L (ref 135–145)
Total Protein: 7.3 g/dL (ref 6.0–8.3)

## 2014-05-01 LAB — TROPONIN I
TROPONIN I: 0.04 ng/mL — AB (ref <0.031)
Troponin I: <0.03 ng/mL (ref <0.031)
Troponin I: <0.03 ng/mL (ref <0.031)
Troponin I: 0.04 ng/mL — ABNORMAL HIGH (ref <0.031)

## 2014-05-01 LAB — CBC
HCT: 43.4 % (ref 39.0–52.0)
HEMOGLOBIN: 14.4 g/dL (ref 13.0–17.0)
MCH: 26.9 pg (ref 26.0–34.0)
MCHC: 33.2 g/dL (ref 30.0–36.0)
MCV: 81.1 fL (ref 78.0–100.0)
Platelets: 191 10*3/uL (ref 150–400)
RBC: 5.35 MIL/uL (ref 4.22–5.81)
RDW: 13.5 % (ref 11.5–15.5)
WBC: 10.2 10*3/uL (ref 4.0–10.5)

## 2014-05-01 LAB — MRSA PCR SCREENING: MRSA BY PCR: NEGATIVE

## 2014-05-01 LAB — GLUCOSE, CAPILLARY
GLUCOSE-CAPILLARY: 130 mg/dL — AB (ref 70–99)
Glucose-Capillary: 120 mg/dL — ABNORMAL HIGH (ref 70–99)

## 2014-05-01 LAB — MAGNESIUM: Magnesium: 2.3 mg/dL (ref 1.5–2.5)

## 2014-05-01 LAB — AMMONIA: AMMONIA: 25 umol/L (ref 11–32)

## 2014-05-01 LAB — CBG MONITORING, ED: GLUCOSE-CAPILLARY: 189 mg/dL — AB (ref 70–99)

## 2014-05-01 LAB — I-STAT CG4 LACTIC ACID, ED: Lactic Acid, Venous: 3.63 mmol/L (ref 0.5–2.0)

## 2014-05-01 LAB — TSH: TSH: 8.048 u[IU]/mL — AB (ref 0.350–4.500)

## 2014-05-01 LAB — PHOSPHORUS: Phosphorus: 5.3 mg/dL — ABNORMAL HIGH (ref 2.3–4.6)

## 2014-05-01 MED ORDER — SODIUM CHLORIDE 0.9 % IV BOLUS (SEPSIS)
500.0000 mL | Freq: Once | INTRAVENOUS | Status: AC
  Administered 2014-05-01: 500 mL via INTRAVENOUS

## 2014-05-01 MED ORDER — ONDANSETRON HCL 4 MG/2ML IJ SOLN
4.0000 mg | Freq: Four times a day (QID) | INTRAMUSCULAR | Status: DC | PRN
  Administered 2014-05-07: 4 mg via INTRAVENOUS
  Filled 2014-05-01: qty 2

## 2014-05-01 MED ORDER — BRIMONIDINE TARTRATE 0.2 % OP SOLN
1.0000 [drp] | Freq: Two times a day (BID) | OPHTHALMIC | Status: DC
  Administered 2014-05-01 – 2014-05-24 (×46): 1 [drp] via OPHTHALMIC
  Filled 2014-05-01 (×2): qty 5

## 2014-05-01 MED ORDER — SENNOSIDES-DOCUSATE SODIUM 8.6-50 MG PO TABS: 1.0000 | ORAL_TABLET | Freq: Every evening | ORAL | Status: DC | PRN

## 2014-05-01 MED ORDER — SODIUM CHLORIDE 0.9 % IJ SOLN
3.0000 mL | Freq: Two times a day (BID) | INTRAMUSCULAR | Status: DC
  Administered 2014-05-01: 3 mL via INTRAVENOUS
  Administered 2014-05-02: 10 mL via INTRAVENOUS
  Administered 2014-05-02 – 2014-05-23 (×26): 3 mL via INTRAVENOUS

## 2014-05-01 MED ORDER — FLEET ENEMA 7-19 GM/118ML RE ENEM: 1.0000 | ENEMA | Freq: Once | RECTAL | Status: AC | PRN

## 2014-05-01 MED ORDER — SODIUM CHLORIDE 0.9 % IV BOLUS (SEPSIS)
1000.0000 mL | Freq: Once | INTRAVENOUS | Status: AC
  Administered 2014-05-01: 1000 mL via INTRAVENOUS

## 2014-05-01 MED ORDER — ONDANSETRON HCL 4 MG PO TABS: 4.0000 mg | ORAL_TABLET | Freq: Four times a day (QID) | ORAL | Status: DC | PRN

## 2014-05-01 MED ORDER — ALBUTEROL SULFATE (2.5 MG/3ML) 0.083% IN NEBU: 2.5000 mg | INHALATION_SOLUTION | RESPIRATORY_TRACT | Status: DC | PRN

## 2014-05-01 MED ORDER — SODIUM CHLORIDE 0.9 % IV SOLN
Freq: Once | INTRAVENOUS | Status: AC
  Administered 2014-05-01: 14:00:00 via INTRAVENOUS

## 2014-05-01 MED ORDER — HEPARIN SODIUM (PORCINE) 5000 UNIT/ML IJ SOLN
5000.0000 [IU] | Freq: Three times a day (TID) | INTRAMUSCULAR | Status: DC
  Administered 2014-05-01 – 2014-05-24 (×62): 5000 [IU] via SUBCUTANEOUS
  Filled 2014-05-01 (×77): qty 1

## 2014-05-01 MED ORDER — SODIUM CHLORIDE 0.9 % IV SOLN
INTRAVENOUS | Status: DC
  Administered 2014-05-01 – 2014-05-03 (×3): via INTRAVENOUS

## 2014-05-01 MED ORDER — DOCUSATE SODIUM 100 MG PO CAPS
100.0000 mg | ORAL_CAPSULE | Freq: Two times a day (BID) | ORAL | Status: DC
  Administered 2014-05-02 – 2014-05-21 (×37): 100 mg via ORAL
  Filled 2014-05-01 (×45): qty 1

## 2014-05-01 MED ORDER — PIPERACILLIN-TAZOBACTAM 3.375 G IVPB
3.3750 g | Freq: Once | INTRAVENOUS | Status: AC
  Administered 2014-05-01: 3.375 g via INTRAVENOUS
  Filled 2014-05-01: qty 50

## 2014-05-01 MED ORDER — PIPERACILLIN-TAZOBACTAM 3.375 G IVPB
3.3750 g | Freq: Three times a day (TID) | INTRAVENOUS | Status: DC
  Administered 2014-05-01 – 2014-05-02 (×3): 3.375 g via INTRAVENOUS
  Filled 2014-05-01 (×2): qty 50

## 2014-05-01 MED ORDER — SODIUM CHLORIDE 0.9 % IV BOLUS (SEPSIS)
1000.0000 mL | Freq: Once | INTRAVENOUS | Status: AC
Start: 2014-05-01 — End: 2014-05-01
  Administered 2014-05-01: 1000 mL via INTRAVENOUS

## 2014-05-01 MED ORDER — BISACODYL 5 MG PO TBEC
5.0000 mg | DELAYED_RELEASE_TABLET | Freq: Every day | ORAL | Status: DC | PRN
  Filled 2014-05-01: qty 1

## 2014-05-01 MED ORDER — SODIUM CHLORIDE 0.9 % IV SOLN: 1500.0000 mg | INTRAVENOUS | Status: DC

## 2014-05-01 MED ORDER — SODIUM BICARBONATE 8.4 % IV SOLN
50.0000 meq | Freq: Once | INTRAVENOUS | Status: DC
  Filled 2014-05-01: qty 50

## 2014-05-01 MED ORDER — NYSTATIN 100000 UNIT/GM EX POWD
Freq: Two times a day (BID) | CUTANEOUS | Status: DC
  Administered 2014-05-01 – 2014-05-24 (×46): via TOPICAL
  Filled 2014-05-01 (×2): qty 15

## 2014-05-01 MED ORDER — FLUCONAZOLE 100MG IVPB
100.0000 mg | INTRAVENOUS | Status: DC
  Administered 2014-05-01 – 2014-05-02 (×2): 100 mg via INTRAVENOUS
  Filled 2014-05-01 (×2): qty 50

## 2014-05-01 MED ORDER — VANCOMYCIN HCL IN DEXTROSE 1-5 GM/200ML-% IV SOLN
1000.0000 mg | Freq: Once | INTRAVENOUS | Status: AC
  Administered 2014-05-01: 1000 mg via INTRAVENOUS
  Filled 2014-05-01: qty 200

## 2014-05-01 MED ORDER — SODIUM CHLORIDE 0.9 % IV SOLN
1250.0000 mg | INTRAVENOUS | Status: AC
  Administered 2014-05-01: 1250 mg via INTRAVENOUS
  Filled 2014-05-01: qty 1250

## 2014-05-01 NOTE — ED Notes (Signed)
Bed: [id number] Expected date:  Expected time:  Means of arrival:  Comments: EMS 81yo M Altered Mental Status, Lethargic

## 2014-05-01 NOTE — ED Notes (Signed)
Pressure ulcer noted to left great toe Moisture associated breakdown noted to bilateral groin, skin folds on abdomen and skin folds under bilateral breasts

## 2014-05-01 NOTE — Progress Notes (Signed)
Dr. Waldron Labs paged twice for minimal urine output.  Ordered 1000cc IVF bolus again.  Bladder scanned patient and patient had 14 cc in bladder.  Speech in to see patient , patient proceeded to vomit and have a large diarrhea stool.  Patient cleaned and linens changed.  IVF continue to infuse.  Monitor patient closely.  Nhan Qualley Roselie Awkward RN

## 2014-05-01 NOTE — ED Provider Notes (Signed)
CSN: [id number]     Arrival date & time 05/01/14  0549 History   First MD Initiated Contact with Patient 05/01/14 0617     Chief Complaint  Patient presents with  . Altered Mental Status     (Consider location/radiation/quality/duration/timing/severity/associated sxs/prior Treatment) HPI Comments: 81 yo make with known history of DM, HTN, Hyperlipidemia, and CVA presents via EMS with altered mentation and hypothermia.  Pt is alert to person and place, however unable to obtain history from pt. Spoke with daughter via phone and she reports pt resides in the home with family, is normally alert, oriented and ambulates with a walker and cane.  Reports pt came upstairs to dinner last night at 8:00pm for dinner and "just dozed off to sleep".  States he stayed in a chair until 0400 this morning and he would not get up. Denies recent illness, complaints of pain, fever, or change in behavior/mentation.  Patient is a 81 y.o. male presenting with altered mental status.  Altered Mental Status   Past Medical History  Diagnosis Date  . Diabetes mellitus without complication   . Hypertension   . Blind left eye    No past surgical history on file. No family history on file. History  Substance Use Topics  . Smoking status: Never Smoker   . Smokeless tobacco: Not on file  . Alcohol Use: No    Review of Systems  Unable to perform ROS: Mental status change      Allergies  Review of patient's allergies indicates no known allergies.  Home Medications   Prior to Admission medications   Medication Sig Start Date End Date Taking? Authorizing Provider  acetaminophen (TYLENOL) 325 MG tablet Take 2 tablets (650 mg total) by mouth every 6 (six) hours as needed for mild pain or moderate pain (or Fever >/= 101). 04/20/13   Sheila Oats, MD  aspirin EC 81 MG tablet Take 81 mg by mouth daily.    Historical Provider, MD  cyanocobalamin (,VITAMIN B-12,) 1000 MCG/ML injection INJECT 1 ML AS DIRECTED EVERY  MONTH 04/05/13   Darlyne Russian, MD  docusate sodium 100 MG CAPS Take 100 mg by mouth 2 (two) times daily. 04/20/13   Sheila Oats, MD  furosemide (LASIX) 20 MG tablet TAKE 1 TABLET (20 MG TOTAL) BY MOUTH DAILY. 03/30/14   Darlyne Russian, MD  losartan (COZAAR) 50 MG tablet Take 1 tablet (50 mg total) by mouth daily. 03/30/14   Darlyne Russian, MD  traMADol-acetaminophen (ULTRACET) 37.5-325 MG per tablet Take 1 tablet by mouth 2 (two) times daily.    Historical Provider, MD  zoster vaccine live, PF, (ZOSTAVAX) 25956 UNT/0.65ML injection Inject 19,400 Units into the skin once. Patient not taking: Reported on 02/06/2014 07/01/13   Darlyne Russian, MD   BP 128/89 mmHg  Pulse 75  Temp(Src) 94.2 F (34.6 C) (Rectal)  Resp 23  SpO2 99% Physical Exam  Constitutional: He appears ill.  HENT:  Head: Normocephalic.  Eyes: Conjunctivae and lids are normal. Left eye exhibits normal extraocular motion. Right pupil is reactive.  Blind in left eye, no pupillary reaction.  Right eye irregular pupil and sluggish.  Neck: Normal range of motion. Neck supple. No tracheal deviation and no edema present.  Cardiovascular: Normal rate, regular rhythm, S1 normal, S2 normal and normal pulses.   No murmur heard. Pulmonary/Chest: Effort normal. He has decreased breath sounds in the right lower field and the left lower field. He has no wheezes. He has  no rhonchi. He has no rales.  Abdominal: Soft. Bowel sounds are normal. He exhibits no distension and no mass. There is no hepatosplenomegaly. There is no tenderness.  Genitourinary: Penis normal.  Musculoskeletal: Normal range of motion.  Neurological: He is alert. No cranial nerve deficit. GCS eye subscore is 4. GCS verbal subscore is 4. GCS motor subscore is 6.  Pt alert to person and tells me his first name and states he is in the ER.  Skin: No rash noted. There is pallor.    ED Course  Procedures (including critical care time) Labs Review Labs Reviewed  COMPREHENSIVE  METABOLIC PANEL - Abnormal; Notable for the following:    Glucose, Bld 203 (*)    BUN 43 (*)    Creatinine, Ser 2.02 (*)    AST 284 (*)    ALT 105 (*)    GFR calc non Af Amer 28 (*)    GFR calc Af Amer 33 (*)    All other components within normal limits  PHOSPHORUS - Abnormal; Notable for the following:    Phosphorus 5.3 (*)    All other components within normal limits  BLOOD GAS, ARTERIAL - Abnormal; Notable for the following:    pO2, Arterial 79.7 (*)    Acid-base deficit 2.9 (*)    All other components within normal limits  CBG MONITORING, ED - Abnormal; Notable for the following:    Glucose-Capillary 189 (*)    All other components within normal limits  I-STAT CG4 LACTIC ACID, ED - Abnormal; Notable for the following:    Lactic Acid, Venous 3.63 (*)    All other components within normal limits  CULTURE, BLOOD (ROUTINE X 2)  CULTURE, BLOOD (ROUTINE X 2)  CBC  URINALYSIS, ROUTINE W REFLEX MICROSCOPIC  MAGNESIUM  AMMONIA  TROPONIN I  TSH    Imaging Review Dg Chest 2 View  05/01/2014   CLINICAL DATA:  Per EPIC chart notes, pt brought by EMS due to family-observed mental status change, hypothermic, at time of imaging, pt seems disoriented, unable to communicate, unable to follow directions for imaging, Held by tech for imaging, pt unwilling to raise arms for lateral view  EXAM: CHEST  2 VIEW  COMPARISON:  02/05/2014  FINDINGS: Cardiac silhouette normal in size and configuration. Normal mediastinal and hilar contours.  Clear lungs.  No pleural effusion or pneumothorax.  Bony thorax is demineralized but grossly intact.  IMPRESSION: No acute cardiopulmonary disease.   Electronically Signed   By: Lajean Manes M.D.   On: 05/01/2014 07:26   Ct Head Wo Contrast  05/01/2014   CLINICAL DATA:  Behavioral changes.  EXAM: CT HEAD WITHOUT CONTRAST  TECHNIQUE: Contiguous axial images were obtained from the base of the skull through the vertex without intravenous contrast.  COMPARISON:   02/05/2014 and 04/18/2013.  FINDINGS: Ventricles are normal in configuration. There is ventricular and sulcal enlargement reflecting moderate atrophy. No hydrocephalus.  There are no parenchymal masses or mass effect. There are small old lacune infarcts the right base a ganglia and left periventricular white matter. Patchy areas of white matter hypoattenuation are noted more diffusely consistent with moderate chronic microvascular ischemic change. There is no evidence of a recent cortical infarct.  There are no extra-axial masses or abnormal fluid collections.  There is no intracranial hemorrhage.  Visualized sinuses and mastoid air cells are clear.  IMPRESSION: 1. No acute intracranial abnormalities. 2. Moderate atrophy and chronic microvascular ischemic change. Small old lacune infarcts.   Electronically Signed  By: Lajean Manes M.D.   On: 05/01/2014 07:09     EKG Interpretation   Date/Time:  Sunday May 01 2014 07:41:24 EDT Ventricular Rate:  71 PR Interval:  144 QRS Duration: 102 QT Interval:  422 QTC Calculation: 459 R Axis:   31 Text Interpretation:  Sinus rhythm Low voltage, precordial leads Baseline  wander in lead(s) V5 No significant change since last tracing Confirmed by  Winfred Leeds  MD, SAM [phone]) on 05/01/2014 7:44:18 AM      MDM   Final diagnoses:  Altered mental status  Hypothermia, initial encounter  Elevated LFTs  Ventricular tachyarrhythmia    Pt to be admitted for likely sepsis. Pt  Awake and will take but doesn't actively follow command. Ill appearing. Discussed code status with daughter and she states at this time she would like him to be a full code.no definite infection. Abdomen not acute. Pt no definite infection in lungs and urine.pt having some runs of ventricular tachycardia:with elevated lft's, no definite acute abdominal process. To to be admitted to step down    Glendell Docker, NP 05/01/14 [place], MD 05/01/14 [place],  MD 05/01/14 [phone]

## 2014-05-01 NOTE — Progress Notes (Signed)
Patient in [place] with minimal urine output.  Dr. Waldron Labs paged twice to notify him of decreased urine output.  Patient has received approximately 3 liters of IVF.  Dr. Waldron Labs ordered 500 cc more IVF.  Continue to monitor patient closely.

## 2014-05-01 NOTE — ED Notes (Signed)
Family member took patient's belongings home

## 2014-05-01 NOTE — Progress Notes (Signed)
Patient still having very minimal urine output, bladder scanned 0 cc, paged and received an order to give a 500 cc bolus. Will continue to monitor patient.

## 2014-05-01 NOTE — ED Notes (Signed)
Report given, transfer to 1238

## 2014-05-01 NOTE — ED Notes (Addendum)
Patient brought in by [facility] after patient's family concerned that patient isn't acting like himself Family not present upon arrival and EMS unable to provide information as to how the patient is not at baseline Patient denies any complaints of pain and states that he did not get any rest last night and wants to go to sleep Patient is oriented to name and place, but is unable to state year/president

## 2014-05-01 NOTE — Evaluation (Signed)
Clinical/Bedside Swallow Evaluation Patient Details  Name: Curtis Brock MRN: [id number] Date of Birth: 1925/11/05  Today's Date: 05/01/2014 Time: SLP Start Time (ACUTE ONLY): 49 SLP Stop Time (ACUTE ONLY): 1618 SLP Time Calculation (min) (ACUTE ONLY): 19 min  Past Medical History:  Past Medical History  Diagnosis Date  . Diabetes mellitus without complication   . Hypertension   . Blind left eye    Past Surgical History: History reviewed. No pertinent past surgical history. HPI:  81 y.o. male, with past medical history of diabetes mellitus, CVA, hyperlipidemia, altered mental status, shunt admitted with increased lethargy and confusion than baseline. Pt found to be in acute renal failure. CT head with no acute findings. CXR No acute cardiopulmonary disease. MBS 06/17/12 Mild oral phase dysphagia;Mild pharyngeal phase dysphagia;Mild cervical esophageal phase dysphagia; cervical osteophytes at C2-C3, C3-C4, C4-C5, C5-C6 mildly impairing barium flow into esophagus (C5-C6 mostly impacting swallow). Dys 3 and thin liquids recommended.   Assessment / Plan / Recommendation Clinical Impression  Pt exhibited an audible/sluggish swallow possibly due to MBS in 2014 which revealed mild cervical esophageal phase dysphagia; appeared to have cervical osteophytes mildly impairing barium flow into esophagus (C5-C6 mostly). Today's assessment  resulted in immediate cough following initial cup sip thin with possibly due to decreased labial/lingual acceptance and suspicion of premature spill. Wet vocal quality, audible pharyngeal secretions, throat clearing and coughing secretions/applesauce into oral cavity with SLP suctioning. Pt began to vomit copious amount (orally suctioned and RN present). Continue NPO with oral care and follow up ST for safety with food/liquid versus objective swallow assessment.     Aspiration Risk   (mod-severe)    Diet Recommendation NPO        Other  Recommendations Oral Care  Recommendations: Oral care BID   Follow Up Recommendations   (TBD)    Frequency and Duration min 2x/week  2 weeks   Pertinent Vitals/Pain none     Swallow Study           Oral/Motor/Sensory Function Overall Oral Motor/Sensory Function: Appears within functional limits for tasks assessed   Ice Chips Ice chips: Not tested   Thin Liquid Thin Liquid: Impaired Presentation: Cup;Straw Oral Phase Impairments: Reduced labial seal;Reduced lingual movement/coordination Oral Phase Functional Implications: Prolonged oral transit Pharyngeal  Phase Impairments: Suspected delayed Swallow;Cough - Immediate;Throat Clearing - Delayed;Cough - Delayed (audible/sluggish swallow quality)    Nectar Thick Nectar Thick Liquid: Not tested   Honey Thick Honey Thick Liquid: Not tested   Puree Puree: Impaired Presentation: Spoon Oral Phase Impairments: Impaired anterior to posterior transit Oral Phase Functional Implications: Prolonged oral transit Pharyngeal Phase Impairments: Suspected delayed Swallow (audible swallow)   Solid   GO    Solid: Not tested       Houston Siren 05/01/2014,4:55 PM  Orbie Pyo Colvin Caroli.Ed [employer] [phone]

## 2014-05-01 NOTE — Progress Notes (Signed)
ANTIBIOTIC CONSULT NOTE - INITIAL  Pharmacy Consult for Vancomycin, Zosyn Indication: Sepsis  No Known Allergies  Patient Measurements:    Vital Signs: Temp: 96.8 F (36 C) (04/17 1047) Temp Source: Core (Comment) (04/17 1047) BP: 108/54 mmHg (04/17 1030) Pulse Rate: 71 (04/17 1030) Intake/Output from previous day:   Intake/Output from this shift:    Labs:  Recent Labs  05/01/14 0756  WBC 10.2  HGB 14.4  PLT 191  CREATININE 2.02*   CrCl cannot be calculated (Unknown ideal weight.). No results for input(s): VANCOTROUGH, VANCOPEAK, VANCORANDOM, GENTTROUGH, GENTPEAK, GENTRANDOM, TOBRATROUGH, TOBRAPEAK, TOBRARND, AMIKACINPEAK, AMIKACINTROU, AMIKACIN in the last 72 hours.   Microbiology: No results found for this or any previous visit (from the past 720 hour(s)).  Medical History: Past Medical History  Diagnosis Date  . Diabetes mellitus without complication   . Hypertension   . Blind left eye      Assessment: 32 y/oM with PMH of DM, CVA, HTN who presents to [facility] ED with altered mental status. Patient found to have acute renal failure and elevated LFTs as well as be hypothermic in ED. CT head negative for acute findings. Code sepsis initiated. Pharmacy requested to assist with dosing of empiric Vancomycin and Zosyn for sepsis, possibly due to bilateral lower extremity cellulitis. Patient also started on Fluconazole per MD for fungal rash in groin and abdominal folds.  4/17 >> Vancomycin >> 4/17 >> Zosyn >>  4/17 >> Fluconazole >>   4/17 blood x 2: sent 4/17 MRSA PCR: sent  WBC: WNL Renal: AKI, SCr 2.02 Lactic Acid: 3.63  Goal of Therapy:  Vancomycin trough level 15-20 mcg/ml  Appropriate antibiotic dosing for renal function and indication Eradication of infection  Plan:   Vancomycin 1g IV x 1 given in ED. Give additional 1250 mg IV x 1 now to complete loading dose. Continue with maintenance dose of 1500 mg IV q48h to start on 4/19.  Plan for Vancomycin  trough level at steady state.  Zosyn 3.375g IV x 1 given in ED. Continue with Zosyn 3.375g IV q8h (infuse over 4 hours).  Monitor renal function, cultures, clinical course.   Lindell Spar, PharmD, BCPS Pager: [phone] 05/01/2014 11:50 AM

## 2014-05-01 NOTE — ED Notes (Signed)
CT staff present to take patient for testing Alvino Chapel, NP aware that patient does not have PIV site, labs or an EKG completed Per NP, OK for patient to go to CT

## 2014-05-01 NOTE — Progress Notes (Signed)
Patient with large amount of moist, skin issues in groin and perineum.  Nystatin powder applied to groin and perineum.

## 2014-05-01 NOTE — ED Notes (Signed)
Multiple staff members required to reposition patient Patient incontinent of stool upon arrival---peri care performed

## 2014-05-01 NOTE — ED Notes (Addendum)
ED NP [place] notified of rectal temp. Bear Hugger applied

## 2014-05-01 NOTE — ED Notes (Signed)
Rectal temp checked again for accuracy Rectal temp 94.2

## 2014-05-01 NOTE — H&P (Addendum)
Patient Demographics  Curtis Brock, is a 81 y.o. male  MRN: [id number]   DOB - [birth year]  Admit Date - 05/01/2014  Outpatient Primary MD for the patient is DAUB, Lina Sayre, MD   With History of -  Past Medical History  Diagnosis Date  . Diabetes mellitus without complication   . Hypertension   . Blind left eye       No past surgical history on file.  in for   Chief Complaint  Patient presents with  . Altered Mental Status     HPI  Curtis Brock  is a 81 y.o. male, with past medical history of diabetes mellitus (not taking any home medication), history of CVA, hyperlipidemia, lives at home with family, ambulatory with cane/walker at baseline, as is with altered mental status, daughter at bedside gives history, a shunt was at his baseline yesterday, just noticed to be more lethargic than usual, but normal mentation and appetite, she found him sitting on a chair at 4 AM in the kitchen this morning, appears to be more confused, weak could not stand up, so she brought him to ED, in ED workup was significant for acute renal failure with a creatinine of 2, hypothermia with temperature of 92.4, urinalysis was negative, no acute finding on chest x-ray, blood cultures were sent, CT head with no acute findings, daughter denies father having any focal deficits, facial droop, cough, productive sputum, dysuria. As well patient was noticed to have few runs of NSVT in ED. Patient was started empirically on IV vancomycin and Zosyn, hospitalist requested to admit the patient.   Review of Systems    In addition to the HPI above, unable to obtain from patient secondary to altered mental status.   Social History History  Substance Use Topics  . Smoking status: Never Smoker   . Smokeless tobacco: Not on file  . Alcohol Use: No     Family History No family history on file.  Family history significant for hypertension in multiple family members as per daughter.   Prior to Admission  medications   Medication Sig Start Date End Date Taking? Authorizing Provider  acetaminophen (TYLENOL) 325 MG tablet Take 2 tablets (650 mg total) by mouth every 6 (six) hours as needed for mild pain or moderate pain (or Fever >/= 101). 04/20/13  Yes Sheila Oats, MD  aspirin EC 81 MG tablet Take 81 mg by mouth daily.   Yes Historical Provider, MD  brimonidine (ALPHAGAN) 0.2 % ophthalmic solution Place 1 drop into the right eye 2 (two) times daily. 03/28/14  Yes Historical Provider, MD  cyanocobalamin (,VITAMIN B-12,) 1000 MCG/ML injection INJECT 1 ML AS DIRECTED EVERY MONTH 04/05/13  Yes Darlyne Russian, MD  furosemide (LASIX) 20 MG tablet TAKE 1 TABLET (20 MG TOTAL) BY MOUTH DAILY. 03/30/14  Yes Darlyne Russian, MD  losartan (COZAAR) 50 MG tablet Take 1 tablet (50 mg total) by mouth daily. 03/30/14  Yes Darlyne Russian, MD    No Known Allergies  Physical Exam  Vitals  Blood pressure 145/96, pulse 71, temperature 92.4 F (33.6 C), temperature source Core (Comment), resp. rate 18, SpO2 100 %.   1. General frail ill-appearing male lying in bed in NAD,  Lethargic.  2.  Patient is lethargic, responsive to loud voice and painful stimuli, but none communicative.  3.  Unable to perform rectal exam giving his mental status.  4. Right eye blindness.dry Oral Mucosa.  5. Supple Neck, No  JVD, No cervical lymphadenopathy appriciated, No Carotid Bruits.  6. Symmetrical Chest wall movement, decreased air movement bilaterally, but no wheezing or rhonchi.  7. RRR, No Gallops, Rubs or Murmurs, No Parasternal Heave.  8. Positive Bowel Sounds, Abdomen Soft, No tenderness, No organomegaly appriciated,No rebound -guarding or rigidity.  9.  No Cyanosis, Normal Skin Turgor,  has bilateral lower extremity erythema, as well has fungal rash in the groin area and lower abdominal fold.      Data Review  CBC  Recent Labs Lab 05/01/14 0756  WBC 10.2  HGB 14.4  HCT 43.4  PLT 191  MCV 81.1  MCH 26.9    MCHC 33.2  RDW 13.5   ------------------------------------------------------------------------------------------------------------------  Chemistries   Recent Labs Lab 05/01/14 0756 05/01/14 0838  NA 136  --   K 4.8  --   CL 101  --   CO2 22  --   GLUCOSE 203*  --   BUN 43*  --   CREATININE 2.02*  --   CALCIUM 9.4  --   MG  --  2.3  AST 284*  --   ALT 105*  --   ALKPHOS 82  --   BILITOT 0.5  --    ------------------------------------------------------------------------------------------------------------------ CrCl cannot be calculated (Unknown ideal weight.). ------------------------------------------------------------------------------------------------------------------  Recent Labs  05/01/14 0838  TSH 8.048*     Coagulation profile No results for input(s): INR, PROTIME in the last 168 hours. ------------------------------------------------------------------------------------------------------------------- No results for input(s): DDIMER in the last 72 hours. -------------------------------------------------------------------------------------------------------------------  Cardiac Enzymes  Recent Labs Lab 05/01/14 0854  TROPONINI <0.03   ------------------------------------------------------------------------------------------------------------------ Invalid input(s): POCBNP   ---------------------------------------------------------------------------------------------------------------  Urinalysis    Component Value Date/Time   COLORURINE YELLOW 05/01/2014 0618   APPEARANCEUR CLEAR 05/01/2014 0618   LABSPEC 1.017 05/01/2014 0618   PHURINE 5.5 05/01/2014 0618   GLUCOSEU NEGATIVE 05/01/2014 0618   HGBUR NEGATIVE 05/01/2014 0618   BILIRUBINUR NEGATIVE 05/01/2014 0618   KETONESUR NEGATIVE 05/01/2014 0618   PROTEINUR NEGATIVE 05/01/2014 0618   UROBILINOGEN 0.2 05/01/2014 0618   NITRITE NEGATIVE 05/01/2014 0618   LEUKOCYTESUR NEGATIVE 05/01/2014  0618    ----------------------------------------------------------------------------------------------------------------  Imaging results:   Dg Chest 2 View  05/01/2014   CLINICAL DATA:  Per EPIC chart notes, pt brought by EMS due to family-observed mental status change, hypothermic, at time of imaging, pt seems disoriented, unable to communicate, unable to follow directions for imaging, Held by tech for imaging, pt unwilling to raise arms for lateral view  EXAM: CHEST  2 VIEW  COMPARISON:  02/05/2014  FINDINGS: Cardiac silhouette normal in size and configuration. Normal mediastinal and hilar contours.  Clear lungs.  No pleural effusion or pneumothorax.  Bony thorax is demineralized but grossly intact.  IMPRESSION: No acute cardiopulmonary disease.   Electronically Signed   By: Lajean Manes M.D.   On: 05/01/2014 07:26   Ct Head Wo Contrast  05/01/2014   CLINICAL DATA:  Behavioral changes.  EXAM: CT HEAD WITHOUT CONTRAST  TECHNIQUE: Contiguous axial images were obtained from the base of the skull through the vertex without intravenous contrast.  COMPARISON:  02/05/2014 and 04/18/2013.  FINDINGS: Ventricles are normal in configuration. There is ventricular and sulcal enlargement reflecting moderate atrophy. No hydrocephalus.  There are no parenchymal masses or mass effect. There are small old lacune infarcts the right base a ganglia and left periventricular white matter. Patchy areas of white matter hypoattenuation are noted more diffusely consistent with moderate chronic microvascular ischemic change. There is no evidence of a  recent cortical infarct.  There are no extra-axial masses or abnormal fluid collections.  There is no intracranial hemorrhage.  Visualized sinuses and mastoid air cells are clear.  IMPRESSION: 1. No acute intracranial abnormalities. 2. Moderate atrophy and chronic microvascular ischemic change. Small old lacune infarcts.   Electronically Signed   By: Lajean Manes M.D.   On:  05/01/2014 07:09       Assessment & Plan  Principal Problem:   SIRS (systemic inflammatory response syndrome) Active Problems:   Diabetes mellitus   CVA (cerebral infarction)   Elevated LFTs   Acute encephalopathy   Acute renal failure    SIRS/sepsis - Patient meets sepsis criteria given his hypothermia, tachypnea, with possible severe sepsis given his acute renal failure and elevated LFTs (endorgan damage). - Only source I could identify right now is bilateral lower extremity cellulitis, negative urinalysis, his x-ray showing no active cardiopulmonary disease, but culture still pending. - Continue with broad-spectrum IV antibiotics including vancomycin and Zosyn. - Continue with IV fluids.  Elevated LFTs - This is most likely in the setting of sepsis, no right upper quadrant tenderness, will check right upper quadrant ultrasound.  Acute renal failure -   hold Lasix and lisinopril, secondary to volume depletion, continue with IV fluids.  Acute encephalopathy - CT head with no acute findings, most likely metabolic encephalopathy secondary to sepsis, ammonia within normal limits. - Keep nothing by mouth till seen by SLP.  History of CVA - Resume aspirin when able to take oral.  Fungal rash in groin and abdominal fold areas - Start on IV Diflucan, and change to oral once able to take by mouth, start on nystatin powder.  2 runs of NSVT - Check echo, cycle troponins. Continue with telemetry monitoring  Diabetes mellitus - Does not take any home medication, will monitor CBGs every 6 hours, if elevated will start on insulin sliding scale.    DVT Prophylaxis Heparin -   AM Labs Ordered, also please review Full Orders  Family Communication: Admission, patients condition and plan of care including tests being ordered have been discussed with the patient's daughter who indicate understanding and agree with the plan and Code Status.  Code Status full  Likely DC to  will  admit to stepdown  Condition GUARDED    Time spent in minutes : 65 minutes    Charita Lindenberger M.D on 05/01/2014 at 10:26 AM  Between 7am to 7pm - Pager - [phone]  After 7pm go to [email or web address] - password [email or web address]  And look for the night coverage person covering me after hours  [facility] Office  [phone]   **Disclaimer: This note may have been dictated with voice recognition software. Similar sounding words can inadvertently be transcribed and this note may contain transcription errors which may not have been corrected upon publication of note.**

## 2014-05-02 ENCOUNTER — Encounter (HOSPITAL_COMMUNITY): Payer: Self-pay | Admitting: Radiology

## 2014-05-02 ENCOUNTER — Inpatient Hospital Stay (HOSPITAL_COMMUNITY): Payer: Medicare Other

## 2014-05-02 DIAGNOSIS — I4901 Ventricular fibrillation: Secondary | ICD-10-CM

## 2014-05-02 LAB — COMPREHENSIVE METABOLIC PANEL
ALK PHOS: 67 U/L (ref 39–117)
ALT: 208 U/L — AB (ref 0–53)
AST: 620 U/L — AB (ref 0–37)
Albumin: 2.5 g/dL — ABNORMAL LOW (ref 3.5–5.2)
Anion gap: 9 (ref 5–15)
BUN: 50 mg/dL — ABNORMAL HIGH (ref 6–23)
CHLORIDE: 108 mmol/L (ref 96–112)
CO2: 20 mmol/L (ref 19–32)
Calcium: 7.7 mg/dL — ABNORMAL LOW (ref 8.4–10.5)
Creatinine, Ser: 3.26 mg/dL — ABNORMAL HIGH (ref 0.50–1.35)
GFR, EST AFRICAN AMERICAN: 18 mL/min — AB (ref >90)
GFR, EST NON AFRICAN AMERICAN: 16 mL/min — AB (ref >90)
Glucose, Bld: 152 mg/dL — ABNORMAL HIGH (ref 70–99)
POTASSIUM: 4.7 mmol/L (ref 3.5–5.1)
SODIUM: 137 mmol/L (ref 135–145)
Total Bilirubin: 0.6 mg/dL (ref 0.3–1.2)
Total Protein: 5.4 g/dL — ABNORMAL LOW (ref 6.0–8.3)

## 2014-05-02 LAB — GLUCOSE, CAPILLARY
Glucose-Capillary: 146 mg/dL — ABNORMAL HIGH (ref 70–99)
Glucose-Capillary: 127 mg/dL — ABNORMAL HIGH (ref 70–99)
Glucose-Capillary: 141 mg/dL — ABNORMAL HIGH (ref 70–99)

## 2014-05-02 LAB — CBC
HEMATOCRIT: 35.6 % — AB (ref 39.0–52.0)
Hemoglobin: 11.5 g/dL — ABNORMAL LOW (ref 13.0–17.0)
MCH: 26.2 pg (ref 26.0–34.0)
MCHC: 32.3 g/dL (ref 30.0–36.0)
MCV: 81.1 fL (ref 78.0–100.0)
Platelets: 182 10*3/uL (ref 150–400)
RBC: 4.39 MIL/uL (ref 4.22–5.81)
RDW: 13.7 % (ref 11.5–15.5)
WBC: 9.6 10*3/uL (ref 4.0–10.5)

## 2014-05-02 MED ORDER — SODIUM CHLORIDE 0.9 % IV SOLN
Freq: Once | INTRAVENOUS | Status: AC
  Administered 2014-05-02: 10:00:00 via INTRAVENOUS

## 2014-05-02 MED ORDER — SODIUM CHLORIDE 0.9 % IV BOLUS (SEPSIS): 1000.0000 mL | Freq: Once | INTRAVENOUS | Status: DC

## 2014-05-02 MED ORDER — PIPERACILLIN-TAZOBACTAM IN DEX 2-0.25 GM/50ML IV SOLN
2.2500 g | Freq: Four times a day (QID) | INTRAVENOUS | Status: DC
  Administered 2014-05-02 – 2014-05-03 (×5): 2.25 g via INTRAVENOUS
  Filled 2014-05-02 (×6): qty 50

## 2014-05-02 MED ORDER — ASPIRIN EC 81 MG PO TBEC
81.0000 mg | DELAYED_RELEASE_TABLET | Freq: Every day | ORAL | Status: DC
  Administered 2014-05-02 – 2014-05-24 (×23): 81 mg via ORAL
  Filled 2014-05-02 (×23): qty 1

## 2014-05-02 MED ORDER — TRAMADOL HCL 50 MG PO TABS
25.0000 mg | ORAL_TABLET | Freq: Once | ORAL | Status: AC
  Administered 2014-05-02: 25 mg via ORAL
  Filled 2014-05-02: qty 1

## 2014-05-02 MED ORDER — IOHEXOL 300 MG/ML  SOLN
25.0000 mL | INTRAMUSCULAR | Status: AC
  Administered 2014-05-02 (×2): 25 mL via ORAL

## 2014-05-02 MED ORDER — FLUCONAZOLE 100 MG PO TABS
100.0000 mg | ORAL_TABLET | Freq: Every day | ORAL | Status: DC
  Administered 2014-05-03: 100 mg via ORAL
  Filled 2014-05-02: qty 1

## 2014-05-02 NOTE — Progress Notes (Signed)
Speech Language Pathology Treatment: Dysphagia  Patient Details Name: Curtis Brock MRN: [id number] DOB: Mar 14, 1925 Today's Date: 05/02/2014 Time: [id number] SLP Time Calculation (min) (ACUTE ONLY): 25 min  Assessment / Plan / Recommendation Clinical Impression  Patient presents with improvements in mentation and swallowing function as compared to initial evaluation 4/17. Diagnostic po trials provided by clinician with goal to establish least restrictive diet today. Patient able to self feed with min verbal cueing for small sips/bites, slow rate of intake. No overt indication of aspiration noted except for with thin liquids via straw suspected due to decreased oral control of bolus resulting in delayed swallow initiation. Otherwise, patient seemingly protecting airway. Education complete with patient and RN regarding safe swallowing precautions. Both verbalized understanding although given AMS, patient will require brief assist/f/u for carryover. Will initiate diet (dysphagia 3 due to missing dentition) and f/u briefly.    HPI HPI: 81 y.o. male, with past medical history of diabetes mellitus, CVA, hyperlipidemia, altered mental status, shunt admitted with increased lethargy and confusion than baseline. Pt found to be in acute renal failure. CT head with no acute findings. CXR No acute cardiopulmonary disease. MBS 06/17/12 Mild oral phase dysphagia;Mild pharyngeal phase dysphagia;Mild cervical esophageal phase dysphagia; cervical osteophytes at C2-C3, C3-C4, C4-C5, C5-C6 mildly impairing barium flow into esophagus (C5-C6 mostly impacting swallow). Dys 3 and thin liquids recommended.   Pertinent Vitals Pain Assessment: No/denies pain  SLP Plan  Goals updated    Recommendations Diet recommendations: Dysphagia 3 (mechanical soft);Thin liquid Liquids provided via: Cup;No straw Medication Administration: Whole meds with puree Supervision: Patient able to self feed;Full supervision/cueing for compensatory  strategies Compensations: Slow rate;Small sips/bites Postural Changes and/or Swallow Maneuvers: Seated upright 90 degrees              Oral Care Recommendations: Oral care BID Follow up Recommendations: None Plan: Goals updated    [place], CCC-SLP [phone]   Curtis Brock Curtis Brock 05/02/2014, 9:54 AM

## 2014-05-02 NOTE — Progress Notes (Signed)
Patient is A/Ox3 with periods of confusion. He is on room air. Has had no signs of respiratory distress. He had c/o bilateral foot pain during the shift. New orders were written for prn pain medication. Speech therapy came to evaluate patient. Patient has been anuric, he received fluid bolus of 1 L normal saline in addition to his continuous IV fluids. He still was anuric. Attending MD was notified. Nephrology MD was consulted. New orders written.

## 2014-05-02 NOTE — Consult Note (Signed)
Renal Service Consult Note [facility]  Curtis Brock 05/02/2014 Curtis Brock D Requesting Physician:  Dr Waldron Labs  Reason for Consult:  Acute renal failure HPI: The patient is a 81 y.o. year-old with hx of HTN, blind left eye, presented from home for acute confusion.  Was hypothermic with creat 2.02 and BP's 110/80.  Today creat up to 3.26, minimal UOP.  Initial UA was negative. +LFT's elevated.  Patient w underlying HTN on cozaar and lasix at home.    Patient is poor historian, confused. Denies active CP, SOB, n/v/d or abd pain  Past Medical History  Past Medical History  Diagnosis Date  . Diabetes mellitus without complication   . Hypertension   . Blind left eye    Past Surgical History History reviewed. No pertinent past surgical history. Family History History reviewed. No pertinent family history. Social History  reports that he has never smoked. He does not have any smokeless tobacco history on file. He reports that he does not drink alcohol or use illicit drugs. Allergies No Known Allergies Home medications Prior to Admission medications   Medication Sig Start Date End Date Taking? Authorizing Provider  acetaminophen (TYLENOL) 325 MG tablet Take 2 tablets (650 mg total) by mouth every 6 (six) hours as needed for mild pain or moderate pain (or Fever >/= 101). 04/20/13  Yes Sheila Oats, MD  aspirin EC 81 MG tablet Take 81 mg by mouth daily.   Yes Historical Provider, MD  brimonidine (ALPHAGAN) 0.2 % ophthalmic solution Place 1 drop into the right eye 2 (two) times daily. 03/28/14  Yes Historical Provider, MD  cyanocobalamin (,VITAMIN B-12,) 1000 MCG/ML injection INJECT 1 ML AS DIRECTED EVERY MONTH 04/05/13  Yes Darlyne Russian, MD  furosemide (LASIX) 20 MG tablet TAKE 1 TABLET (20 MG TOTAL) BY MOUTH DAILY. 03/30/14  Yes Darlyne Russian, MD  losartan (COZAAR) 50 MG tablet Take 1 tablet (50 mg total) by mouth daily. 03/30/14  Yes Darlyne Russian, MD   Liver Function  Tests  Recent Labs Lab 05/01/14 0756 05/02/14 0405  AST 284* 620*  ALT 105* 208*  ALKPHOS 82 67  BILITOT 0.5 0.6  PROT 7.3 5.4*  ALBUMIN 3.7 2.5*   No results for input(s): LIPASE, AMYLASE in the last 168 hours. CBC  Recent Labs Lab 05/01/14 0756 05/02/14 0405  WBC 10.2 9.6  HGB 14.4 11.5*  HCT 43.4 35.6*  MCV 81.1 81.1  PLT 191 [id number]   Basic Metabolic Panel  Recent Labs Lab 05/01/14 0756 05/01/14 0838 05/02/14 0405  NA 136  --  137  K 4.8  --  4.7  CL 101  --  108  CO2 22  --  20  GLUCOSE 203*  --  152*  BUN 43*  --  50*  CREATININE 2.02*  --  3.26*  CALCIUM 9.4  --  7.7*  PHOS  --  5.3*  --     Filed Vitals:   05/02/14 0200 05/02/14 0300 05/02/14 0400 05/02/14 0500  BP: 145/60 141/56 135/81 152/63  Pulse: 78 78 80 79  Temp: 98.6 F (37 C) 98.6 F (37 C) 98.8 F (37.1 C) 98.8 F (37.1 C)  TempSrc:   Core (Comment)   Resp: 13 14 12 13   Height:      Weight:      SpO2: 98% 97% 100% 99%   Exam Elderly AAM, no distress, frail and weak No rash, cyanosis or gangrene Sclera anicteric, throat clear No jvd  Chest clear bilat RRR no MRG Abd obese, NTND, no mass or ascites, no HSM GU foley draining cloudy minimal amts urine Mild nonpitting edema bilat dependent hip area Neuro confused, responsive to simple commands  UA (undersigned) > large Hb, 2+ protein, 3+ WBC (20-50 per hpf) and diffuse bacteria, 5-10 rbc, moderate gran casts   Assessment: 1. Acute renal failure - in setting of hypothermia/ septic picture / ARB; patient is oliguric. Suspect ATN. Doubt GN given sediment.  2. Volume - looks euvolemic now after fluids 3. Pyuria - ? UTI, initial UA was negative 4. HTN was on lasix/ cozaar at home   Plan- cont low-moderate IVF's , await renal recovery, avoid nephrotoxins, cont to hold ARB.  Will d/c IV vancomycin w AKI. Will follow.   Kelly Splinter MD (pgr) [phone]    [phone] 05/02/2014, 5:17 PM

## 2014-05-02 NOTE — Progress Notes (Addendum)
Patient Demographics  Curtis Brock, is a 81 y.o. male, DOB - 1925/12/11, [id number]  Admit date - 05/01/2014   Admitting Physician Albertine Patricia, MD  Outpatient Primary MD for the patient is DAUB, Lina Sayre, MD  LOS - 1   Chief Complaint  Patient presents with  . Altered Mental Status      Admission history of present illness/brief narrative: Curtis Brock is a 81 y.o. male, with past medical history of diabetes mellitus (not taking any home medication), history of CVA, hyperlipidemia, presents with altered mental status, daughter at bedside gives history,noticed to be more lethargic than usual, and  confused, weak could not stand up,  in ED workup was significant for acute renal failure with a creatinine of 2, hypothermia with temperature of 92.4, urinalysis was negative, no acute finding on chest x-ray, blood cultures were sent, CT head with no acute findings, . As well patient was noticed to have 2 runs of NSVT in ED. Patient was started empirically on IV vancomycin and Zosyn, , patient received IV fluid bolus for his renal failure, with no improvement, remains anuric.  Subjective:   Pradeep Keirsey today has, No headache, No chest pain, No abdominal pain - No Nausea,  No Cough - SOB. more awake and communicative today.  Assessment & Plan    Principal Problem:   SIRS (systemic inflammatory response syndrome) Active Problems:   Diabetes mellitus   CVA (cerebral infarction)   Elevated LFTs   Acute encephalopathy   Acute renal failure   Fungal rash of trunk  SIRS/sepsis - Patient meets sepsis criteria on admission given his hypothermia, tachypnea. - Only infectious source was mild lower extremity cellulitis, appears to be totally resolved by now., - Continue with broad-spectrum IV antibiotics including vancomycin and Zosyn for now, will don't escalate if blood  cultures remain negative by tomorrow. - Continue with IV fluids.  Elevated LFTs - Much worsened today, ultrasound showing 1 cm gallstone lodged in the gallbladder neck with no evidence of cholecystitis, well check CT abdomen pelvis for further evaluation( no IV contrast given his renal failure) - No pain in right upper quadrant on physical exam.   Acute renal failure/anuric. - No urinary output yesterday despite 5 L of IV fluid boluses over the last 24 hours. - hold Lasix and lisinopril. -  Continue with IV fluids . - Nephrology service consulted .  Acute encephalopathy - CT head with no acute findings, most likely metabolic encephalopathy secondary to sepsis, -  ammonia within normal limits. -Will have his COPD evaluate today has significant improvement of mental status . - Significantly improved, more awake alert oriented uncommunicative .  History of CVA - Resume aspirin when able to take oral.  Fungal rash in groin and abdominal fold areas -  on IV Diflucan,well  change to oral once able to take by mouth, on nystatin powder.  2 runs of NSVT - 2-D echo pending  -No recurrence on telemetry - No significant elevation in troponin.  Diabetes mellitus - Does not take any home medication,  CBGs every 6 hours are acceptable, if they can become more elevated after bite started will start an insulin sliding scale..   Code Status: Full code  Family Communication: Son-in-law at  bedside  Disposition Plan: Remains in stepdown   Procedures  None   Consults   Nephrology   Medications  Scheduled Meds: . brimonidine  1 drop Right Eye BID  . docusate sodium  100 mg Oral BID  . fluconazole (DIFLUCAN) IV  100 mg Intravenous Q24H  . heparin  5,000 Units Subcutaneous 3 times per day  . iohexol  25 mL Oral Q1 Hr x 2  . nystatin   Topical BID  . piperacillin-tazobactam (ZOSYN)  IV  2.25 g Intravenous 4 times per day  . sodium bicarbonate  50 mEq Intravenous Once  . sodium  chloride  3 mL Intravenous Q12H  . [START ON 05/03/2014] vancomycin  1,500 mg Intravenous Q48H   Continuous Infusions: . sodium chloride 125 mL/hr at 05/02/14 0718   PRN Meds:.albuterol, bisacodyl, ondansetron **OR** ondansetron (ZOFRAN) IV, senna-docusate  DVT Prophylaxis   Heparin - SCDs   Lab Results  Component Value Date   PLT 182 05/02/2014    Antibiotics    Anti-infectives    Start     Dose/Rate Route Frequency Ordered Stop   05/03/14 1000  vancomycin (VANCOCIN) 1,500 mg in sodium chloride 0.9 % 500 mL IVPB     1,500 mg 250 mL/hr over 120 Minutes Intravenous Every 48 hours 05/01/14 1145     05/02/14 1200  piperacillin-tazobactam (ZOSYN) IVPB 2.25 g     2.25 g 100 mL/hr over 30 Minutes Intravenous 4 times per day 05/02/14 0850     05/01/14 1400  piperacillin-tazobactam (ZOSYN) IVPB 3.375 g  Status:  Discontinued     3.375 g 12.5 mL/hr over 240 Minutes Intravenous 3 times per day 05/01/14 1147 05/02/14 0850   05/01/14 1200  fluconazole (DIFLUCAN) IVPB 100 mg     100 mg 50 mL/hr over 60 Minutes Intravenous Every 24 hours 05/01/14 1056     05/01/14 1200  vancomycin (VANCOCIN) 1,250 mg in sodium chloride 0.9 % 250 mL IVPB     1,250 mg 166.7 mL/hr over 90 Minutes Intravenous STAT 05/01/14 1144 05/01/14 1335   05/01/14 0830  vancomycin (VANCOCIN) IVPB 1000 mg/200 mL premix     1,000 mg 200 mL/hr over 60 Minutes Intravenous  Once 05/01/14 0820 05/01/14 0945   05/01/14 0830  piperacillin-tazobactam (ZOSYN) IVPB 3.375 g     3.375 g 12.5 mL/hr over 240 Minutes Intravenous  Once 05/01/14 0820 05/01/14 0854          Objective:   Filed Vitals:   05/02/14 0200 05/02/14 0300 05/02/14 0400 05/02/14 0500  BP: 145/60 141/56 135/81 152/63  Pulse: 78 78 80 79  Temp: 98.6 F (37 C) 98.6 F (37 C) 98.8 F (37.1 C) 98.8 F (37.1 C)  TempSrc:   Core (Comment)   Resp: 13 14 12 13   Height:      Weight:      SpO2: 98% 97% 100% 99%    Wt Readings from Last 3 Encounters:    05/01/14 107.8 kg (237 lb 10.5 oz)  03/29/14 99.338 kg (219 lb)  12/28/13 100.245 kg (221 lb)     Intake/Output Summary (Last 24 hours) at 05/02/14 1056 Last data filed at 05/02/14 1012  Gross per 24 hour  Intake   4488 ml  Output     22 ml  Net   4466 ml     Physical Exam  Awake Alert, Oriented X2, No new F.N deficits, more pleasant uncommunicative. Left eye blindness Supple Neck,No JVD, No cervical lymphadenopathy appriciated.  Symmetrical Chest  wall movement, Good air movement bilaterally, no wheezing RRR,No Gallops,Rubs or new Murmurs, No Parasternal Heave +ve B.Sounds, Abd Soft, No tenderness, No organomegaly appriciated, No rebound - guarding or rigidity. No Cyanosis, Clubbing or edema, has fungal skin rash in folds around genital and abdominal fold area.   Data Review   Micro Results Recent Results (from the past 240 hour(s))  Culture, blood (routine x 2)     Status: None (Preliminary result)   Collection Time: 05/01/14  7:56 AM  Result Value Ref Range Status   Specimen Description BLOOD LEFT HAND  Final   Special Requests BOTTLES DRAWN AEROBIC ONLY 4 CC  Final   Culture   Final           BLOOD CULTURE RECEIVED NO GROWTH TO DATE CULTURE WILL BE HELD FOR 5 DAYS BEFORE ISSUING A FINAL NEGATIVE REPORT Performed at [facility]    Report Status PENDING  Incomplete  Culture, blood (routine x 2)     Status: None (Preliminary result)   Collection Time: 05/01/14  7:56 AM  Result Value Ref Range Status   Specimen Description BLOOD RIGHT HAND  Final   Special Requests BOTTLES DRAWN AEROBIC AND ANAEROBIC 3 CC EACH  Final   Culture   Final           BLOOD CULTURE RECEIVED NO GROWTH TO DATE CULTURE WILL BE HELD FOR 5 DAYS BEFORE ISSUING A FINAL NEGATIVE REPORT Performed at [facility]    Report Status PENDING  Incomplete  MRSA PCR Screening     Status: None   Collection Time: 05/01/14 11:03 AM  Result Value Ref Range Status   MRSA by PCR NEGATIVE  NEGATIVE Final    Comment:        The GeneXpert MRSA Assay (FDA approved for NASAL specimens only), is one component of a comprehensive MRSA colonization surveillance program. It is not intended to diagnose MRSA infection nor to guide or monitor treatment for MRSA infections.     Radiology Reports Dg Chest 2 View  05/01/2014   CLINICAL DATA:  Per EPIC chart notes, pt brought by EMS due to family-observed mental status change, hypothermic, at time of imaging, pt seems disoriented, unable to communicate, unable to follow directions for imaging, Held by tech for imaging, pt unwilling to raise arms for lateral view  EXAM: CHEST  2 VIEW  COMPARISON:  02/05/2014  FINDINGS: Cardiac silhouette normal in size and configuration. Normal mediastinal and hilar contours.  Clear lungs.  No pleural effusion or pneumothorax.  Bony thorax is demineralized but grossly intact.  IMPRESSION: No acute cardiopulmonary disease.   Electronically Signed   By: Lajean Manes M.D.   On: 05/01/2014 07:26   Ct Head Wo Contrast  05/01/2014   CLINICAL DATA:  Behavioral changes.  EXAM: CT HEAD WITHOUT CONTRAST  TECHNIQUE: Contiguous axial images were obtained from the base of the skull through the vertex without intravenous contrast.  COMPARISON:  02/05/2014 and 04/18/2013.  FINDINGS: Ventricles are normal in configuration. There is ventricular and sulcal enlargement reflecting moderate atrophy. No hydrocephalus.  There are no parenchymal masses or mass effect. There are small old lacune infarcts the right base a ganglia and left periventricular white matter. Patchy areas of white matter hypoattenuation are noted more diffusely consistent with moderate chronic microvascular ischemic change. There is no evidence of a recent cortical infarct.  There are no extra-axial masses or abnormal fluid collections.  There is no intracranial hemorrhage.  Visualized sinuses and  mastoid air cells are clear.  IMPRESSION: 1. No acute intracranial  abnormalities. 2. Moderate atrophy and chronic microvascular ischemic change. Small old lacune infarcts.   Electronically Signed   By: Lajean Manes M.D.   On: 05/01/2014 07:09   US Abdomen Limited Ruq  05/01/2014   CLINICAL DATA:  Elevated liver function tests.  Hypertension.  EXAM: US ABDOMEN LIMITED - RIGHT UPPER QUADRANT  COMPARISON:  None.  FINDINGS: Gallbladder:  1.0 cm gallstone lodged in the gallbladder neck. The remainder the gallbladder was poorly visualized due to overlying bowel gas. No gross additional calculated, wall thickening or pericholecystic fluid. Assessment for tenderness cannot be determined due to the fact that the patient is unresponsive.  Common bile duct:  Diameter: 2.5 mm  Liver:  No focal lesion identified. Within normal limits in parenchymal echogenicity.  IMPRESSION: 1. Limited examination due to overlying bowel gas and inability of the patient to cooperate. 2. 1.0 cm gallstone lodged in the gallbladder neck with no evidence of cholecystitis. Mid   Electronically Signed   By: Claudie Revering M.D.   On: 05/01/2014 14:11    CBC  Recent Labs Lab 05/01/14 0756 05/02/14 0405  WBC 10.2 9.6  HGB 14.4 11.5*  HCT 43.4 35.6*  PLT 191 182  MCV 81.1 81.1  MCH 26.9 26.2  MCHC 33.2 32.3  RDW 13.5 13.7    Chemistries   Recent Labs Lab 05/01/14 0756 05/01/14 0838 05/02/14 0405  NA 136  --  137  K 4.8  --  4.7  CL 101  --  108  CO2 22  --  20  GLUCOSE 203*  --  152*  BUN 43*  --  50*  CREATININE 2.02*  --  3.26*  CALCIUM 9.4  --  7.7*  MG  --  2.3  --   AST 284*  --  620*  ALT 105*  --  208*  ALKPHOS 82  --  67  BILITOT 0.5  --  0.6   ------------------------------------------------------------------------------------------------------------------ estimated creatinine clearance is 19 mL/min (by C-G formula based on Cr of 3.26). ------------------------------------------------------------------------------------------------------------------ No results for  input(s): HGBA1C in the last 72 hours. ------------------------------------------------------------------------------------------------------------------ No results for input(s): CHOL, HDL, LDLCALC, TRIG, CHOLHDL, LDLDIRECT in the last 72 hours. ------------------------------------------------------------------------------------------------------------------  Recent Labs  05/01/14 0838  TSH 8.048*   ------------------------------------------------------------------------------------------------------------------ No results for input(s): VITAMINB12, FOLATE, FERRITIN, TIBC, IRON, RETICCTPCT in the last 72 hours.  Coagulation profile No results for input(s): INR, PROTIME in the last 168 hours.  No results for input(s): DDIMER in the last 72 hours.  Cardiac Enzymes  Recent Labs Lab 05/01/14 1207 05/01/14 1754 05/01/14 2255  TROPONINI <0.03 0.04* 0.04*   ------------------------------------------------------------------------------------------------------------------ Invalid input(s): POCBNP     Time Spent in minutes   40 minutes   Delma Villalva M.D on 05/02/2014 at 10:56 AM  Between 7am to 7pm - Pager - [phone]  After 7pm go to [email or web address] - password [email or web address]  And look for the night coverage person covering for me after hours  [facility] Office  [phone]   **Disclaimer: This note may have been dictated with voice recognition software. Similar sounding words can inadvertently be transcribed and this note may contain transcription errors which may not have been corrected upon publication of note.**

## 2014-05-02 NOTE — Evaluation (Signed)
Physical Therapy Evaluation Patient Details Name: Curtis Brock MRN: [id number] DOB: 1925-01-22 Today's Date: 05/02/2014   History of Present Illness  81 yo male admitted with SIRS, sepsis, AMS. Hx of DM,CVA  Clinical Impression  On eval, pt required Max assist +2 for mobility-able to partially stand x 2 with RW from bed. Multimodal cues for all tasks. Difficulty following commands and processing tasks. Decreased safety awareness. Recommending SNF at this time.     Follow Up Recommendations SNF;Supervision/Assistance - 24 hour    Equipment Recommendations  None recommended by PT    Recommendations for Other Services OT consult     Precautions / Restrictions Precautions Precautions: Fall Restrictions Weight Bearing Restrictions: No      Mobility  Bed Mobility Overal bed mobility: Needs Assistance Bed Mobility: Supine to Sit;Sit to Supine     Supine to sit: Max assist;+2 for physical assistance;+2 for safety/equipment;HOB elevated Sit to supine: Max assist;+2 for physical assistance;+2 for safety/equipment;HOB elevated   General bed mobility comments: Assist for trunk and bil LEs. Increased time. Utilized bedpad for positioning. Multimodal cues required.   Transfers Overall transfer level: Needs assistance Equipment used: Rolling walker (2 wheeled) Transfers: Sit to/from Stand Sit to Stand: Max assist;+2 physical assistance;From elevated surface;+2 safety/equipment         General transfer comment: assist to rise, stabilize, control descent. Multimodal cues required. Pt only able to achieve ~60% of full upright standing posture. Stood ~15-20 seconds x2 with RW.   Ambulation/Gait             General Gait Details: NT-unable to safely attempt and steps  Stairs            Wheelchair Mobility    Modified Rankin (Stroke Patients Only)       Balance Overall balance assessment: Needs assistance         Standing balance support: Bilateral upper  extremity supported;During functional activity Standing balance-Leahy Scale: Zero                               Pertinent Vitals/Pain Pain Assessment: No/denies pain    Home Living Family/patient expects to be discharged to:: Private residence Living Arrangements: Children Available Help at Discharge: Family (alone during the day) Type of Home: House Home Access: Stairs to enter Entrance Stairs-Rails: Right Entrance Stairs-Number of Steps: 3 Home Layout: Multi-level Home Equipment: Walker - 2 wheels;Cane - single point;Wheelchair - Firefighter        Extremity/Trunk Assessment   Upper Extremity Assessment: Generalized weakness           Lower Extremity Assessment: Generalized weakness      Cervical / Trunk Assessment: Kyphotic  Communication   Communication: No difficulties  Cognition Arousal/Alertness: Awake/alert Behavior During Therapy: WFL for tasks assessed/performed   Area of Impairment: Orientation;Attention;Following commands;Safety/judgement;Problem solving Orientation Level: Place;Time;Situation Current Attention Level: Sustained   Following Commands: Follows one step commands inconsistently Safety/Judgement: Decreased awareness of safety;Decreased awareness of deficits   Problem Solving: Slow processing;Decreased initiation;Difficulty sequencing;Requires verbal cues;Requires tactile cues      General Comments      Exercises        Assessment/Plan    PT Assessment Patient needs continued PT services  PT Diagnosis Difficulty walking;Generalized weakness;Altered mental status   PT Problem List Decreased  strength;Decreased activity tolerance;Decreased balance;Decreased mobility;Decreased knowledge of use of DME;Decreased safety awareness;Decreased cognition  PT Treatment Interventions DME instruction;Gait training;Functional mobility training;Therapeutic  exercise;Balance training;Patient/family education;Therapeutic activities   PT Goals (Current goals can be found in the Care Plan section) Acute Rehab PT Goals Patient Stated Goal: none stated PT Goal Formulation: Patient unable to participate in goal setting Time For Goal Achievement: 05/16/14 Potential to Achieve Goals: Fair    Frequency Min 3X/week   Barriers to discharge        Co-evaluation               End of Session Equipment Utilized During Treatment: Gait belt Activity Tolerance: Patient tolerated treatment well Patient left: in bed;with call bell/phone within reach;with bed alarm set;with family/visitor present           Time: [id number] PT Time Calculation (min) (ACUTE ONLY): 30 min   Charges:   PT Evaluation $Initial PT Evaluation Tier I: 1 Procedure PT Treatments $Therapeutic Activity: 8-22 mins   PT G Codes:        Weston Anna, MPT Pager: [phone]

## 2014-05-02 NOTE — Progress Notes (Signed)
CARE MANAGEMENT NOTE 05/02/2014  Patient:  Curtis Brock, Curtis Brock   Account Number:  [id number]  Date Initiated:  05/02/2014  Documentation initiated by:  Curtis Brock  Subjective/Objective Assessment:   svt,poss. sepsis, low urinary outpt, low diasytolic pressures, abnormal ekg and elevated troponins.     Action/Plan:   tbd   Anticipated DC Date:  05/05/2014   Anticipated DC Plan:  HOME/SELF CARE  In-house referral  NA      DC Planning Services  CM consult      Choice offered to / List presented to:             Status of service:  In process, will continue to follow Medicare Important Message given?   (If response is "NO", the following Medicare IM given date fields will be blank) Date Medicare IM given:   Medicare IM given by:   Date Additional Medicare IM given:   Additional Medicare IM given by:    Discharge Disposition:    Per UR Regulation:  Reviewed for med. necessity/level of care/duration of stay  If discussed at [place] of Stay Meetings, dates discussed:    Comments:  Curtis Brock, [name] Chart Reviewed. Discharge needs at time of review: None present will follow for needs.

## 2014-05-02 NOTE — Progress Notes (Signed)
Pharmacy - Vancomycin/Zosyn dosing  Assessment: 88yoM with sepsis (possibly from LE cellulitis) and fungal rash started on vanc/Zosyn.  Now with worsening AKI  Plan: Change Zosyn to 2.25 g IV q6 hr while CrCl < 20 ml/min Will check vancomycin trough before tomorrow's dose given worsening renal function (likely not at steady state).  Reuel Boom, PharmD Pager: [phone] 05/02/2014, 8:54 AM

## 2014-05-02 NOTE — Progress Notes (Signed)
  Echocardiogram 2D Echocardiogram has been performed.  Curtis Brock FRANCES 05/02/2014, 10:02 AM

## 2014-05-03 LAB — COMPREHENSIVE METABOLIC PANEL
ALBUMIN: 2.4 g/dL — AB (ref 3.5–5.2)
ALT: 183 U/L — ABNORMAL HIGH (ref 0–53)
ANION GAP: 10 (ref 5–15)
AST: 414 U/L — ABNORMAL HIGH (ref 0–37)
Alkaline Phosphatase: 57 U/L (ref 39–117)
BILIRUBIN TOTAL: 0.5 mg/dL (ref 0.3–1.2)
BUN: 62 mg/dL — AB (ref 6–23)
CO2: 20 mmol/L (ref 19–32)
Calcium: 8 mg/dL — ABNORMAL LOW (ref 8.4–10.5)
Chloride: 110 mmol/L (ref 96–112)
Creatinine, Ser: 4.78 mg/dL — ABNORMAL HIGH (ref 0.50–1.35)
GFR calc Af Amer: 12 mL/min — ABNORMAL LOW (ref >90)
GFR calc non Af Amer: 10 mL/min — ABNORMAL LOW (ref >90)
Glucose, Bld: 108 mg/dL — ABNORMAL HIGH (ref 70–99)
Potassium: 4.8 mmol/L (ref 3.5–5.1)
Sodium: 140 mmol/L (ref 135–145)
TOTAL PROTEIN: 5.2 g/dL — AB (ref 6.0–8.3)

## 2014-05-03 LAB — CBC
HCT: 31.4 % — ABNORMAL LOW (ref 39.0–52.0)
Hemoglobin: 10.3 g/dL — ABNORMAL LOW (ref 13.0–17.0)
MCH: 26.7 pg (ref 26.0–34.0)
MCHC: 32.8 g/dL (ref 30.0–36.0)
MCV: 81.3 fL (ref 78.0–100.0)
PLATELETS: 187 10*3/uL (ref 150–400)
RBC: 3.86 MIL/uL — ABNORMAL LOW (ref 4.22–5.81)
RDW: 14 % (ref 11.5–15.5)
WBC: 7.8 10*3/uL (ref 4.0–10.5)

## 2014-05-03 LAB — GLUCOSE, CAPILLARY
GLUCOSE-CAPILLARY: 105 mg/dL — AB (ref 70–99)
GLUCOSE-CAPILLARY: 108 mg/dL — AB (ref 70–99)
Glucose-Capillary: 103 mg/dL — ABNORMAL HIGH (ref 70–99)

## 2014-05-03 LAB — RENAL FUNCTION PANEL
ANION GAP: 12 (ref 5–15)
Albumin: 2.3 g/dL — ABNORMAL LOW (ref 3.5–5.2)
BUN: 68 mg/dL — AB (ref 6–23)
CALCIUM: 8 mg/dL — AB (ref 8.4–10.5)
CO2: 19 mmol/L (ref 19–32)
Chloride: 109 mmol/L (ref 96–112)
Creatinine, Ser: 5.4 mg/dL — ABNORMAL HIGH (ref 0.50–1.35)
GFR calc non Af Amer: 9 mL/min — ABNORMAL LOW (ref >90)
GFR, EST AFRICAN AMERICAN: 10 mL/min — AB (ref >90)
GLUCOSE: 141 mg/dL — AB (ref 70–99)
Phosphorus: 5.4 mg/dL — ABNORMAL HIGH (ref 2.3–4.6)
Potassium: 4.8 mmol/L (ref 3.5–5.1)
SODIUM: 140 mmol/L (ref 135–145)

## 2014-05-03 LAB — CREATININE, URINE, RANDOM: Creatinine, Urine: 152.09 mg/dL

## 2014-05-03 LAB — SODIUM, URINE, RANDOM: Sodium, Ur: 38 mmol/L

## 2014-05-03 MED ORDER — PIPERACILLIN-TAZOBACTAM IN DEX 2-0.25 GM/50ML IV SOLN
2.2500 g | Freq: Three times a day (TID) | INTRAVENOUS | Status: DC
  Administered 2014-05-03 – 2014-05-04 (×3): 2.25 g via INTRAVENOUS
  Filled 2014-05-03 (×5): qty 50

## 2014-05-03 NOTE — Clinical Documentation Improvement (Signed)
PLEASE NOTE IF PRESENT ON ADMISSION  Presents with Sepsis possibly due to Cellulitis, Metabolic Encephalopathy, ARFwith ATN (Renal Consult).   Wound Care Nurse saw patient on 4/19 and feels the patient has a pressure ulcer in the intragluteal area  Please assess Consult and render an opinion of the Stage of Pressure Ulcer. Please document findings in next progress note and include in discharge summary if applicable.  Stage  I  Pressure Ulcer   (reddening of the skin) Stage  II Pressure Ulcer  (blister open or unopened) Stage  III Pressure Ulcer (through all layers skin) Stage IV Pressure Ulcer   (through skin & underlying  muscle, tendons, and bones) Other Condition Cannot Clinically Determine  Thank You, Zoila Shutter ,RN Clinical Documentation Specialist:  [phone]  [facility] Information Management

## 2014-05-03 NOTE — Progress Notes (Signed)
  [facility] Progress Note   Subjective: remains confused  Filed Vitals:   05/03/14 0500 05/03/14 0600 05/03/14 0800 05/03/14 1000  BP:  148/108 157/68 134/56  Pulse: 79 81 88 87  Temp: 98.1 F (36.7 C) 98.2 F (36.8 C) 98.4 F (36.9 C) 98.6 F (37 C)  TempSrc:   Core (Comment)   Resp: 8 0    Height:      Weight:      SpO2: 98% 99% 96% 99%   Exam: Elderly AAM, no distress, frail and weak No jvd Chest clear bilat RRR no MRG Abd obese, NTND, no mass or ascites, no HSM GU foley draining cloudy minimal amts urine Mild nonpitting edema bilat dependent hip area Neuro confused, responsive to simple commands  UA (undersigned) > large Hb, 2+ protein, 3+ WBC (20-50 per hpf) and diffuse bacteria, 5-10 rbc, moderate gran casts   Assessment: 1. Acute renal failure - prob ATN due to shock/ ARB.  Remains oliguric.  He is a marginal long-term dialysis candidate. Would try to support w/o dialysis in the short-term. Recommend move to [facility] - I have started a discussion w pt's daughter about pt's prognosis, ability to tolerate dialysis in context of age, frailty and comorbidities, etc. 2. Volume - looks euvolemic now after fluids. Dec IVF to 40/hr 3. Pyuria - ? UTI, initial UA was negative 4. HTN home meds on hold        Plan - transfer to [facility], cont supportive care and discussions w family.      Kelly Splinter MD  pager [phone]    cell [phone]  05/03/2014, 1:39 PM     Recent Labs Lab 05/01/14 0756 05/01/14 0838 05/02/14 0405 05/03/14 0330  NA 136  --  137 140  K 4.8  --  4.7 4.8  CL 101  --  108 110  CO2 22  --  20 20  GLUCOSE 203*  --  152* 108*  BUN 43*  --  50* 62*  CREATININE 2.02*  --  3.26* 4.78*  CALCIUM 9.4  --  7.7* 8.0*  PHOS  --  5.3*  --   --     Recent Labs Lab 05/01/14 0756 05/02/14 0405 05/03/14 0330  AST 284* 620* 414*  ALT 105* 208* 183*  ALKPHOS 82 67 57  BILITOT 0.5 0.6 0.5  PROT 7.3 5.4* 5.2*  ALBUMIN 3.7 2.5* 2.4*     Recent Labs Lab 05/01/14 0756 05/02/14 0405 05/03/14 0330  WBC 10.2 9.6 7.8  HGB 14.4 11.5* 10.3*  HCT 43.4 35.6* 31.4*  MCV 81.1 81.1 81.3  PLT 191 182 187   . aspirin EC  81 mg Oral Daily  . brimonidine  1 drop Right Eye BID  . docusate sodium  100 mg Oral BID  . heparin  5,000 Units Subcutaneous 3 times per day  . nystatin   Topical BID  . piperacillin-tazobactam (ZOSYN)  IV  2.25 g Intravenous Q8H  . sodium bicarbonate  50 mEq Intravenous Once  . sodium chloride  3 mL Intravenous Q12H   . sodium chloride 65 mL/hr at 05/03/14 0538   albuterol, bisacodyl, ondansetron **OR** ondansetron (ZOFRAN) IV, senna-docusate

## 2014-05-03 NOTE — Progress Notes (Signed)
Pharmacy - Zosyn dosing  Assessment: 88yoM with sepsis (possibly from LE cellulitis) and fungal rash started on vanc/Zosyn.  Vancomycin stopped 4/18 with worsening AKI which continues to decline today.  Temp, WBC stable  Plan: Change Zosyn to 2.25 g IV q8 hr.  Reuel Boom, PharmD Pager: [phone] 05/03/2014, 1:19 PM

## 2014-05-03 NOTE — Consult Note (Signed)
WOC wound consult note Reason for Consult:Assessment and suggestions for care of intragluteal deep tissue injury Wound type:pressure plus moisture (MASD and ITD) Pressure Ulcer POA: Yes Measurement:6cm x 3cm x 0.2cm with a dark, maroon center measuring 3cm x 2cm that is consistent with a deep tissue injury.  Is is expected that this area with evolve and expose some degree of depth in the center. Wound bed:As described above. Drainage (amount, consistency, odor) scant serous. Periwound:intact Dressing procedure/placement/frequency: Patient has an indwelling urinary catheter for urinary incontinence, but is incontinent of soft light brown stool at the time of my assessment. His buttock cleft is deep and the incontinence and perspiration (in addition to the friction of this area) has likely contributed to the pressure injury.  The POC will entail placement of a larger (bariatric) bed with low air loss feature as his body habitus warrants one for ease in turning and repositioning, timely incontinence care and cleansing and protection of the injured skin with our house products until and if this area evolves.  I will provide bilateral pressure redistribution boots to the heels with are intact at this time, but at risk, and conservative care to the dry, stable eschar on the medial aspect of the left great toe.  Finally, our house antimicrobial textile is provided for moisture management in the intertriginous folds of the panus. [place] nursing team will not follow, but will remain available to this patient, the nursing and medical teams.  Please re-consult if needed and in particular, if the intragluteal wound progresses. Thanks, Maudie Flakes, MSN, RN, [place], [place], [place] [phone])

## 2014-05-03 NOTE — Progress Notes (Signed)
Speech Language Pathology Treatment: Dysphagia  Patient Details Name: Curtis Brock MRN: [id number] DOB: 02-Jan-1926 Today's Date: 05/03/2014 Time: [id number] SLP Time Calculation (min) (ACUTE ONLY): 11 min  Assessment / Plan / Recommendation Clinical Impression  Pt sitting upright in bed upon SLP entrance to room.  SLP spoke to RN and Radio producer prior to session and they report good tolerance of po intake and medicine with applesauce without s/s of aspiration.  Pt benefits from cues to swallow per RN student.  SLP noted cup in room with straw therefore assessed pt swallow ability via straw - sequential straw boluses followed by immediate significant coughing.  After pt allowed time to recover, he tolerated small single boluses via cup.  Unfortunately pt with poor awareness to coughing with water via straw - ? [place].    RNs report they did not place straw in cup, SLP to follow up x1 for family education and to assure po tolerance. Using teach back, educated pt to avoid straws.  Posted signs in room specifically related to NO straws for family/pt.     HPI HPI: 81 y.o. male, with past medical history of diabetes mellitus, CVA, hyperlipidemia, altered mental status, shunt admitted with increased lethargy and confusion than baseline. Pt found to be in acute renal failure. CT head with no acute findings. CXR No acute cardiopulmonary disease. MBS 06/17/12 Mild oral phase dysphagia;Mild pharyngeal phase dysphagia;Mild cervical esophageal phase dysphagia; cervical osteophytes at C2-C3, C3-C4, C4-C5, C5-C6 mildly impairing barium flow into esophagus (C5-C6 mostly impacting swallow). Dys 3 and thin liquids recommended.   Pertinent Vitals Pain Assessment: No/denies pain  SLP Plan       Recommendations Diet recommendations: Dysphagia 3 (mechanical soft);Thin liquid Liquids provided via: Cup;No straw Medication Administration: Whole meds with puree Supervision: Full supervision/cueing for  compensatory strategies;Staff to assist with self feeding Compensations: Slow rate;Small sips/bites Postural Changes and/or Swallow Maneuvers: Seated upright 90 degrees              Oral Care Recommendations: Oral care BID Follow up Recommendations: None    GO     Luanna Salk, [facility] SLP [phone]

## 2014-05-03 NOTE — Progress Notes (Signed)
Prior to the patient leaving he had a large bowel movement.  While cleaning his bottom we found a few areas of broken skin.  Will chart this in flowsheets as well.  The areas of the broken skin is not conducive for the use of our foam dressings. Used barrier cream to protect that area.

## 2014-05-03 NOTE — Progress Notes (Signed)
MD made aware that pt has orders for Q4 CVP but no lines.

## 2014-05-03 NOTE — Progress Notes (Signed)
Patient Demographics  Curtis Brock, is a 81 y.o. male, DOB - Jul 13, 1925, [id number]  Admit date - 05/01/2014   Admitting Physician Albertine Patricia, MD  Outpatient Primary MD for the patient is DAUB, Lina Sayre, MD  LOS - 2   Chief Complaint  Patient presents with  . Altered Mental Status      Admission history of present illness/brief narrative: Curtis Brock is a 81 y.o. male, with past medical history of diabetes mellitus (not taking any home medication), history of CVA, hyperlipidemia, presents with altered mental status, daughter at bedside gives history,noticed to be more lethargic than usual, and  confused, weak could not stand up,  in ED workup was significant for acute renal failure with a creatinine of 2, hypothermia with temperature of 92.4, urinalysis was negative, no acute finding on chest x-ray, blood cultures were sent, CT head with no acute findings, . As well patient was noticed to have 2 runs of NSVT in ED. Patient was started empirically on IV vancomycin and Zosyn, , patient received IV fluid bolus for his renal failure, with no improvement, remains anuric. On day 2 on admission, LFTs and creatinine continues to increase, so had CT abdomen with oral contrast without acute findings, patient creatinine continues to increase, remains anuric, despite being +7000 mL positive balance, will be transferred to [facility] cone stepdown for possible need of hemodialysis in near future.  Subjective:   Curtis Brock today has, No headache, No chest pain, No abdominal pain - No Nausea,  No Cough - SOB. more awake and communicative today.  Assessment & Plan    Principal Problem:   SIRS (systemic inflammatory response syndrome) Active Problems:   Diabetes mellitus   CVA (cerebral infarction)   Elevated LFTs   Acute encephalopathy   Acute renal failure   Fungal rash of  trunk   Acute renal failure/anuric. - Nephrology input appreciated, its in the setting of hypothermia/septic picture/ ARB use at home, possible ATN - No significant urinary output over the last 48 hours ( received more than 5 L bolus in the first 24 hours) - hold Lasix and lisinopril. -  Continue with IV fluids . - Nephrology service consulted, discussed with Dr. Melvia Heaps, continues to have worsening renal function, no urine output, will transfer to [facility] cone stepdown as patient may need hemodialysis in near future.  SIRS/sepsis - Patient meets SIRS criteria on admission given his hypothermia, tachypnea. - Only infectious source was mild lower extremity cellulitis, appears to be totally resolved by day 2 (unclear if this source) -  Usually on vancomycin and Zosyn, vancomycin was stopped on 4/18 giving his warfarin renal function , if remains a febrile, with negative septic workup will discontinue in 24 hours .  Elevated LFTs -  ultrasound showing 1 cm gallstone lodged in the gallbladder neck with no evidence of cholecystitis, CT abdomen pelvis without contrast with no acute finding. - No pain in right upper quadrant on physical exam. - LFTs trending down  Pressure ulcers/deep tissue injury in sacral area - Wound care consult appreciated.  Acute encephalopathy - CT head with no acute findings, most likely metabolic encephalopathy secondary to sepsis, -  ammonia within normal limits. - Resolved  History of CVA - Resume  aspirin when able to take oral.  Fungal rash in groin and abdominal fold areas -  Treated with Diflucan, continue with nystatin powder.  2 runs of NSVT - 2-D echo is poor quality, but findings suggestive of overall EF is within normal limit. - No recurrence on telemetry - No significant elevation in troponin.  Diabetes mellitus - Does not take any home medication,  CBGs are controlled without insulin.  Code Status: Full code  Family Communication: Spoke with  daughter over the phone  Disposition Plan: Remains in stepdown   Procedures  None   Consults   Nephrology   Medications  Scheduled Meds: . aspirin EC  81 mg Oral Daily  . brimonidine  1 drop Right Eye BID  . docusate sodium  100 mg Oral BID  . fluconazole  100 mg Oral Daily  . heparin  5,000 Units Subcutaneous 3 times per day  . nystatin   Topical BID  . piperacillin-tazobactam (ZOSYN)  IV  2.25 g Intravenous 4 times per day  . sodium bicarbonate  50 mEq Intravenous Once  . sodium chloride  3 mL Intravenous Q12H   Continuous Infusions: . sodium chloride 65 mL/hr at 05/03/14 0538   PRN Meds:.albuterol, bisacodyl, ondansetron **OR** ondansetron (ZOFRAN) IV, senna-docusate  DVT Prophylaxis   Heparin - SCDs   Lab Results  Component Value Date   PLT 187 05/03/2014    Antibiotics    Anti-infectives    Start     Dose/Rate Route Frequency Ordered Stop   05/03/14 1000  vancomycin (VANCOCIN) 1,500 mg in sodium chloride 0.9 % 500 mL IVPB  Status:  Discontinued     1,500 mg 250 mL/hr over 120 Minutes Intravenous Every 48 hours 05/01/14 1145 05/02/14 1725   05/03/14 1000  fluconazole (DIFLUCAN) tablet 100 mg     100 mg Oral Daily 05/02/14 1124     05/02/14 1200  piperacillin-tazobactam (ZOSYN) IVPB 2.25 g     2.25 g 100 mL/hr over 30 Minutes Intravenous 4 times per day 05/02/14 0850     05/01/14 1400  piperacillin-tazobactam (ZOSYN) IVPB 3.375 g  Status:  Discontinued     3.375 g 12.5 mL/hr over 240 Minutes Intravenous 3 times per day 05/01/14 1147 05/02/14 0850   05/01/14 1200  fluconazole (DIFLUCAN) IVPB 100 mg  Status:  Discontinued     100 mg 50 mL/hr over 60 Minutes Intravenous Every 24 hours 05/01/14 1056 05/02/14 1124   05/01/14 1200  vancomycin (VANCOCIN) 1,250 mg in sodium chloride 0.9 % 250 mL IVPB     1,250 mg 166.7 mL/hr over 90 Minutes Intravenous STAT 05/01/14 1144 05/01/14 1335   05/01/14 0830  vancomycin (VANCOCIN) IVPB 1000 mg/200 mL premix     1,000  mg 200 mL/hr over 60 Minutes Intravenous  Once 05/01/14 0820 05/01/14 0945   05/01/14 0830  piperacillin-tazobactam (ZOSYN) IVPB 3.375 g     3.375 g 12.5 mL/hr over 240 Minutes Intravenous  Once 05/01/14 0820 05/01/14 0854          Objective:   Filed Vitals:   05/03/14 0500 05/03/14 0600 05/03/14 0800 05/03/14 1000  BP:  148/108 157/68 134/56  Pulse: 79 81 88 87  Temp: 98.1 F (36.7 C) 98.2 F (36.8 C) 98.4 F (36.9 C) 98.6 F (37 C)  TempSrc:   Core (Comment)   Resp: 8 0    Height:      Weight:      SpO2: 98% 99% 96% 99%    Wt  Readings from Last 3 Encounters:  05/03/14 107.3 kg (236 lb 8.9 oz)  03/29/14 99.338 kg (219 lb)  12/28/13 100.245 kg (221 lb)     Intake/Output Summary (Last 24 hours) at 05/03/14 1309 Last data filed at 05/03/14 1100  Gross per 24 hour  Intake 1499.17 ml  Output     10 ml  Net 1489.17 ml     Physical Exam  Awake Alert, Oriented X2, No new F.N deficits, more pleasant uncommunicative. Left eye blindness Supple Neck,No JVD, No cervical lymphadenopathy appriciated.  Symmetrical Chest wall movement, Good air movement bilaterally, no wheezing RRR,No Gallops,Rubs or new Murmurs, No Parasternal Heave +ve B.Sounds, Abd Soft, No tenderness, No organomegaly appriciated, No rebound - guarding or rigidity. No Cyanosis, Clubbing or edema, has fungal skin rash in folds around genital and abdominal fold area.   Data Review   Micro Results Recent Results (from the past 240 hour(s))  Culture, blood (routine x 2)     Status: None (Preliminary result)   Collection Time: 05/01/14  7:56 AM  Result Value Ref Range Status   Specimen Description BLOOD LEFT HAND  Final   Special Requests BOTTLES DRAWN AEROBIC ONLY 4 CC  Final   Culture   Final           BLOOD CULTURE RECEIVED NO GROWTH TO DATE CULTURE WILL BE HELD FOR 5 DAYS BEFORE ISSUING A FINAL NEGATIVE REPORT Performed at [facility]    Report Status PENDING  Incomplete  Culture,  blood (routine x 2)     Status: None (Preliminary result)   Collection Time: 05/01/14  7:56 AM  Result Value Ref Range Status   Specimen Description BLOOD RIGHT HAND  Final   Special Requests BOTTLES DRAWN AEROBIC AND ANAEROBIC 3 CC EACH  Final   Culture   Final           BLOOD CULTURE RECEIVED NO GROWTH TO DATE CULTURE WILL BE HELD FOR 5 DAYS BEFORE ISSUING A FINAL NEGATIVE REPORT Performed at [facility]    Report Status PENDING  Incomplete  MRSA PCR Screening     Status: None   Collection Time: 05/01/14 11:03 AM  Result Value Ref Range Status   MRSA by PCR NEGATIVE NEGATIVE Final    Comment:        The GeneXpert MRSA Assay (FDA approved for NASAL specimens only), is one component of a comprehensive MRSA colonization surveillance program. It is not intended to diagnose MRSA infection nor to guide or monitor treatment for MRSA infections.     Radiology Reports Ct Abdomen Pelvis Wo Contrast  05/02/2014   CLINICAL DATA:  Weakness, lethargy, hypothermia, BILATERAL lower extremity erythema, elevated LFTs, evaluation for source of sepsis  EXAM: CT ABDOMEN AND PELVIS WITHOUT CONTRAST  TECHNIQUE: Multidetector CT imaging of the abdomen and pelvis was performed following the standard protocol without IV contrast. Sagittal and coronal MPR images reconstructed from axial data set. Patient drank dilute oral contrast for exam.  COMPARISON:  04/17/2013  FINDINGS: Minimal RIGHT pleural effusion and RIGHT basilar atelectasis.  Within limits of a nonenhanced exam no focal abnormalities of the liver, spleen, pancreas, kidneys, or adrenal glands.  Calcified gallstone within gallbladder 19 mm greatest size.  No hydronephrosis or ureteral dilatation.  Scattered atherosclerotic calcifications.  Foley catheter decompresses urinary bladder.  Umbilical hernia containing fat.  Sigmoid diverticulosis without evidence of diverticulitis.  Stomach and bowel loops otherwise normal appearance.  No mass,  adenopathy, free fluid or free air.  Normal appendix.  Osseous demineralization with scattered degenerative changes of the thoracolumbar spine.  IMPRESSION: No acute intra-abdominal or intrapelvic abnormalities.  Minimal RIGHT pleural effusion and basilar atelectasis.  Cholelithiasis with otherwise unremarkable gallbladder.  Sigmoid diverticulosis.  Umbilical hernia containing fat.   Electronically Signed   By: Lavonia Dana M.D.   On: 05/02/2014 14:00   US Abdomen Limited Ruq  05/01/2014   CLINICAL DATA:  Elevated liver function tests.  Hypertension.  EXAM: US ABDOMEN LIMITED - RIGHT UPPER QUADRANT  COMPARISON:  None.  FINDINGS: Gallbladder:  1.0 cm gallstone lodged in the gallbladder neck. The remainder the gallbladder was poorly visualized due to overlying bowel gas. No gross additional calculated, wall thickening or pericholecystic fluid. Assessment for tenderness cannot be determined due to the fact that the patient is unresponsive.  Common bile duct:  Diameter: 2.5 mm  Liver:  No focal lesion identified. Within normal limits in parenchymal echogenicity.  IMPRESSION: 1. Limited examination due to overlying bowel gas and inability of the patient to cooperate. 2. 1.0 cm gallstone lodged in the gallbladder neck with no evidence of cholecystitis. Mid   Electronically Signed   By: Claudie Revering M.D.   On: 05/01/2014 14:11    CBC  Recent Labs Lab 05/01/14 0756 05/02/14 0405 05/03/14 0330  WBC 10.2 9.6 7.8  HGB 14.4 11.5* 10.3*  HCT 43.4 35.6* 31.4*  PLT 191 182 187  MCV 81.1 81.1 81.3  MCH 26.9 26.2 26.7  MCHC 33.2 32.3 32.8  RDW 13.5 13.7 14.0    Chemistries   Recent Labs Lab 05/01/14 0756 05/01/14 0838 05/02/14 0405 05/03/14 0330  NA 136  --  137 140  K 4.8  --  4.7 4.8  CL 101  --  108 110  CO2 22  --  20 20  GLUCOSE 203*  --  152* 108*  BUN 43*  --  50* 62*  CREATININE 2.02*  --  3.26* 4.78*  CALCIUM 9.4  --  7.7* 8.0*  MG  --  2.3  --   --   AST 284*  --  620* 414*  ALT 105*   --  208* 183*  ALKPHOS 82  --  67 57  BILITOT 0.5  --  0.6 0.5   ------------------------------------------------------------------------------------------------------------------ estimated creatinine clearance is 13 mL/min (by C-G formula based on Cr of 4.78). ------------------------------------------------------------------------------------------------------------------ No results for input(s): HGBA1C in the last 72 hours. ------------------------------------------------------------------------------------------------------------------ No results for input(s): CHOL, HDL, LDLCALC, TRIG, CHOLHDL, LDLDIRECT in the last 72 hours. ------------------------------------------------------------------------------------------------------------------  Recent Labs  05/01/14 0838  TSH 8.048*   ------------------------------------------------------------------------------------------------------------------ No results for input(s): VITAMINB12, FOLATE, FERRITIN, TIBC, IRON, RETICCTPCT in the last 72 hours.  Coagulation profile No results for input(s): INR, PROTIME in the last 168 hours.  No results for input(s): DDIMER in the last 72 hours.  Cardiac Enzymes  Recent Labs Lab 05/01/14 1207 05/01/14 1754 05/01/14 2255  TROPONINI <0.03 0.04* 0.04*   ------------------------------------------------------------------------------------------------------------------ Invalid input(s): POCBNP     Time Spent in minutes   40 minutes   ELGERGAWY, DAWOOD M.D on 05/03/2014 at 1:09 PM  Between 7am to 7pm - Pager - [phone]  After 7pm go to [email or web address] - password [email or web address]  And look for the night coverage person covering for me after hours  [facility] Office  [phone]   **Disclaimer: This note may have been dictated with voice recognition software. Similar sounding words can inadvertently be transcribed and this note may contain transcription errors which may not have  been corrected upon publication of note.**

## 2014-05-04 DIAGNOSIS — A419 Sepsis, unspecified organism: Secondary | ICD-10-CM

## 2014-05-04 LAB — URINE CULTURE
Colony Count: NO GROWTH
Culture: NO GROWTH

## 2014-05-04 LAB — CBC
HCT: 28.5 % — ABNORMAL LOW (ref 39.0–52.0)
Hemoglobin: 9.8 g/dL — ABNORMAL LOW (ref 13.0–17.0)
MCH: 27.1 pg (ref 26.0–34.0)
MCHC: 34.4 g/dL (ref 30.0–36.0)
MCV: 78.7 fL (ref 78.0–100.0)
Platelets: 161 10*3/uL (ref 150–400)
RBC: 3.62 MIL/uL — AB (ref 4.22–5.81)
RDW: 13.7 % (ref 11.5–15.5)
WBC: 7.7 10*3/uL (ref 4.0–10.5)

## 2014-05-04 LAB — GLUCOSE, CAPILLARY
GLUCOSE-CAPILLARY: 150 mg/dL — AB (ref 70–99)
Glucose-Capillary: 107 mg/dL — ABNORMAL HIGH (ref 70–99)

## 2014-05-04 MED ORDER — FUROSEMIDE 10 MG/ML IJ SOLN
80.0000 mg | Freq: Once | INTRAMUSCULAR | Status: AC
  Administered 2014-05-04: 80 mg via INTRAVENOUS
  Filled 2014-05-04: qty 8

## 2014-05-04 NOTE — Progress Notes (Signed)
[place] TEAM 1 - Stepdown/ICU TEAM Progress Note  Curtis Brock [name] DOB: 04/13/1925 DOA: 05/01/2014 PCP: Jenny Reichmann, MD  Admit HPI / Brief Narrative: [name] with history of diabetes mellitus (not taking any home medication), CVA, and hyperlipidemia who presented with altered mental status.  In ED workup was significant for acute renal failure with a creatinine of 2, hypothermia with temperature of 92.4, urinalysis was negative, no acute finding on chest x-ray, blood cultures were sent, CT head with no acute findings.   Patient's crt continued to increase after admission to [facility], he remained anuric despite being +7000 mL positive, and was therefore transferred to [facility] for possible need of hemodialysis in near future.  HPI/Subjective: The patient is alert and quite pleasant though not oriented to time or situation.  He denies chest pain fevers chills nausea vomiting or abdominal pain.  Assessment/Plan:  Acute renal failure / anuric. -Nephrology following - family reporting some urinary output noted today - unfortunately creatinine continues to climb at this time  SIRS -Patient meets SIRS criteria on admission given his hypothermia, tachypnea. -No convincing radiographic or clinical evidence of infection - stopping antibiotics  Elevated LFTs -ultrasound showing 1 cm gallstone lodged in the gallbladder neck with no evidence of cholecystitis, CT abdomen pelvis without contrast with no acute finding. -No pain in right upper quadrant on physical exam. -LFTs trending down - recheck in a.m.  Pressure ulcers/deep tissue injury in sacral area -Wound care following  Acute encephalopathy - CT head with no acute findings - most likely metabolic encephalopathy secondary to acute renal failure -ammonia within normal limits - Rapidly improving  History of CVA - Resume aspirin when able to take oral consistently  Fungal rash in groin and abdominal fold areas - Treated  with Diflucan, continue with nystatin powder  2 runs of NSVT - 2-D echo is poor quality, but findings suggestive of overall EF is within normal limit. - No recurrence on telemetry - No significant elevation in troponin  Diabetes mellitus - Does not take any home medication, CBGs are controlled without insulin.  Code Status: FULL Family Communication: Spoke with patient and son in law at bedside Disposition Plan: Stepdown unit  Consultants: Nephrology  Procedures: None  Antibiotics: Zosyn 4/17 > 4/20 Vancomycin 4/17  DVT prophylaxis: Subcutaneous heparin  Objective: Blood pressure 145/82, pulse 73, temperature 97.5 F (36.4 C), temperature source Oral, resp. rate 15, height 5\' 6"  (1.676 m), weight 107.2 kg (236 lb 5.3 oz), SpO2 100 %.  Intake/Output Summary (Last 24 hours) at 05/04/14 1807 Last data filed at 05/04/14 1708  Gross per 24 hour  Intake    170 ml  Output    100 ml  Net     70 ml   Exam: General: No acute respiratory distress - mildly confused but quite pleasant Lungs: Clear to auscultation bilaterally without wheezes or crackles Cardiovascular: Regular rate and rhythm without murmur gallop or rub normal S1 and S2 Abdomen: Nontender, nondistended, soft, bowel sounds positive, no rebound, no ascites, no appreciable mass Extremities: No significant cyanosis, clubbing, or edema bilateral lower extremities  Data Reviewed: Basic Metabolic Panel:  Recent Labs Lab 05/01/14 0756 05/01/14 0838 05/02/14 0405 05/03/14 0330 05/03/14 1330  NA 136  --  137 140 140  K 4.8  --  4.7 4.8 4.8  CL 101  --  108 110 109  CO2 22  --  20 20 19   GLUCOSE 203*  --  152* 108* 141*  BUN 43*  --  50* 62* 68*  CREATININE 2.02*  --  3.26* 4.78* 5.40*  CALCIUM 9.4  --  7.7* 8.0* 8.0*  MG  --  2.3  --   --   --   PHOS  --  5.3*  --   --  5.4*    Liver Function Tests:  Recent Labs Lab 05/01/14 0756 05/02/14 0405 05/03/14 0330 05/03/14 1330  AST 284* 620* 414*  --     ALT 105* 208* 183*  --   ALKPHOS 82 67 57  --   BILITOT 0.5 0.6 0.5  --   PROT 7.3 5.4* 5.2*  --   ALBUMIN 3.7 2.5* 2.4* 2.3*   Recent Labs Lab 05/01/14 0838  AMMONIA 25   CBC:  Recent Labs Lab 05/01/14 0756 05/02/14 0405 05/03/14 0330 05/04/14 0335  WBC 10.2 9.6 7.8 7.7  HGB 14.4 11.5* 10.3* 9.8*  HCT 43.4 35.6* 31.4* 28.5*  MCV 81.1 81.1 81.3 78.7  PLT 191 182 187 161    Cardiac Enzymes:  Recent Labs Lab 05/01/14 0854 05/01/14 1207 05/01/14 1754 05/01/14 2255  TROPONINI <0.03 <0.03 0.04* 0.04*    CBG:  Recent Labs Lab 05/02/14 1954 05/02/14 2336 05/03/14 0401 05/03/14 2338 05/04/14 1702  GLUCAP 108* 105* 103* 107* 150*    Recent Results (from the past 240 hour(s))  Culture, blood (routine x 2)     Status: None (Preliminary result)   Collection Time: 05/01/14  7:56 AM  Result Value Ref Range Status   Specimen Description BLOOD LEFT HAND  Final   Special Requests BOTTLES DRAWN AEROBIC ONLY 4 CC  Final   Culture   Final           BLOOD CULTURE RECEIVED NO GROWTH TO DATE CULTURE WILL BE HELD FOR 5 DAYS BEFORE ISSUING A FINAL NEGATIVE REPORT Performed at [facility]    Report Status PENDING  Incomplete  Culture, blood (routine x 2)     Status: None (Preliminary result)   Collection Time: 05/01/14  7:56 AM  Result Value Ref Range Status   Specimen Description BLOOD RIGHT HAND  Final   Special Requests BOTTLES DRAWN AEROBIC AND ANAEROBIC 3 CC EACH  Final   Culture   Final           BLOOD CULTURE RECEIVED NO GROWTH TO DATE CULTURE WILL BE HELD FOR 5 DAYS BEFORE ISSUING A FINAL NEGATIVE REPORT Performed at [facility]    Report Status PENDING  Incomplete  MRSA PCR Screening     Status: None   Collection Time: 05/01/14 11:03 AM  Result Value Ref Range Status   MRSA by PCR NEGATIVE NEGATIVE Final    Comment:        The GeneXpert MRSA Assay (FDA approved for NASAL specimens only), is one component of a comprehensive MRSA  colonization surveillance program. It is not intended to diagnose MRSA infection nor to guide or monitor treatment for MRSA infections.   Culture, Urine     Status: None   Collection Time: 05/03/14  5:19 AM  Result Value Ref Range Status   Specimen Description URINE, CATHETERIZED  Final   Special Requests NONE  Final   Colony Count NO GROWTH Performed at [facility]   Final   Culture NO GROWTH Performed at [facility]   Final   Report Status 05/04/2014 FINAL  Final     Studies:   Recent x-ray studies have been reviewed in detail  by the Attending Physician  Scheduled Meds:  Scheduled Meds: . aspirin EC  81 mg Oral Daily  . brimonidine  1 drop Right Eye BID  . docusate sodium  100 mg Oral BID  . heparin  5,000 Units Subcutaneous 3 times per day  . nystatin   Topical BID  . piperacillin-tazobactam (ZOSYN)  IV  2.25 g Intravenous Q8H  . sodium bicarbonate  50 mEq Intravenous Once  . sodium chloride  3 mL Intravenous Q12H    Time spent on care of this patient: 35 mins   MCCLUNG,JEFFREY T , MD   [facility] Office  [phone] Pager - Text Page per Shea Evans as per below:  On-Call/Text Page:      [email or web address]      password [email or web address]  If 7PM-7AM, please contact night-coverage [email or web address] Password [email or web address] 05/04/2014, 6:07 PM   LOS: 3 days

## 2014-05-04 NOTE — Progress Notes (Signed)
INITIAL NUTRITION ASSESSMENT  DOCUMENTATION CODES Per approved criteria  -Obesity Unspecified   INTERVENTION: Ensure Enlive po BID, each supplement provides 350 kcal and 20 grams of protein RD to follow for nutrition care plan  NUTRITION DIAGNOSIS: Increased nutrient needs related to wound healing as evidenced by estimated nutrition needs  Goal: Pt to meet >/= 90% of their estimated nutrition needs   Monitor:  PO & supplemental intake, weight, labs, I/O's  Reason for Assessment: Low [place]  81 y.o. male  Admitting Dx: SIRS (systemic inflammatory response syndrome)  ASSESSMENT: [name] with PMH of diabetes mellitus (not taking any home medication), history of CVA, hyperlipidemia, lives at home with family; daughter found him sitting on a chair at 4 AM in the kitchen this morning, appears to be more confused, weak could not stand up, so she brought him to ED, in ED workup was significant for acute renal failure with a creatinine of 2, hypothermia with temperature of 92.4.  Patient reports a good appetite.  Per meal tray observation, pt consumed ~ 50% at breakfast this AM.  No recent weight loss.  Lives with his daughter and son-in-law.  States his family prepares meals for him.  Eats 3 meals per day.    CWOCN note reviewed.  Pt with DTI to intragluteal area.  Nutrient needs increased given wound presence.  Would benefit from addition of oral nutrition supplement.  Pt amenable.  RD to order.  No muscle or subcutaneous fat depletion noticed.  Height: Ht Readings from Last 1 Encounters:  05/03/14 5\' 6"  (1.676 m)    Weight: Wt Readings from Last 1 Encounters:  05/04/14 236 lb 5.3 oz (107.2 kg)    Ideal Body Weight: 142 lb  % Ideal Body Weight: 166%  Wt Readings from Last 10 Encounters:  05/04/14 236 lb 5.3 oz (107.2 kg)  03/29/14 219 lb (99.338 kg)  12/28/13 221 lb (100.245 kg)  10/05/13 220 lb (99.791 kg)  07/01/13 213 lb 9.6 oz (96.888 kg)  05/04/13 223 lb  (101.152 kg)  04/19/13 216 lb 8 oz (98.204 kg)  01/19/13 209 lb 12.8 oz (95.165 kg)  10/13/12 220 lb (99.791 kg)  07/28/12 220 lb 6.4 oz (99.973 kg)    Usual Body Weight: 220 lb  % Usual Body Weight: 107%  BMI:  Body mass index is 38.16 kg/(m^2).  Estimated Nutritional Needs: Kcal: [id number] Protein: 95-105 gm Fluid: 1.7-1.9 L  Skin: intragluteal DTI  Diet Order: DIET DYS 3 Room service appropriate?: Yes; Fluid consistency:: Thin  EDUCATION NEEDS: -No education needs identified at this time   Intake/Output Summary (Last 24 hours) at 05/04/14 1010 Last data filed at 05/03/14 1800  Gross per 24 hour  Intake    770 ml  Output      0 ml  Net    770 ml    Last BM: 4/19  Labs:   Recent Labs Lab 05/01/14 0838 05/02/14 0405 05/03/14 0330 05/03/14 1330  NA  --  137 140 140  K  --  4.7 4.8 4.8  CL  --  108 110 109  CO2  --  20 20 19   BUN  --  50* 62* 68*  CREATININE  --  3.26* 4.78* 5.40*  CALCIUM  --  7.7* 8.0* 8.0*  MG 2.3  --   --   --   PHOS 5.3*  --   --  5.4*  GLUCOSE  --  152* 108* 141*    CBG (last 3)  Recent Labs  05/02/14 2336 05/03/14 0401 05/03/14 2338  GLUCAP 105* 103* 107*    Scheduled Meds: . aspirin EC  81 mg Oral Daily  . brimonidine  1 drop Right Eye BID  . docusate sodium  100 mg Oral BID  . heparin  5,000 Units Subcutaneous 3 times per day  . nystatin   Topical BID  . piperacillin-tazobactam (ZOSYN)  IV  2.25 g Intravenous Q8H  . sodium bicarbonate  50 mEq Intravenous Once  . sodium chloride  3 mL Intravenous Q12H    Continuous Infusions: . sodium chloride 65 mL/hr at 05/03/14 2241    Past Medical History  Diagnosis Date  . Diabetes mellitus without complication   . Hypertension   . Blind left eye     History reviewed. No pertinent past surgical history.  Arthur Holms, RD, LDN Pager #: [phone] After-Hours Pager #: [phone]

## 2014-05-04 NOTE — Care Management Note (Signed)
  Page 1 of 1   05/04/2014     11:01:35 AM CARE MANAGEMENT NOTE 05/04/2014  Patient:  Curtis Brock   Account Number:  [id number]  Date Initiated:  05/02/2014  Documentation initiated by:  DAVIS,RHONDA  Subjective/Objective Assessment:   svt,poss. sepsis, low urinary outpt, low diasytolic pressures, abnormal ekg and elevated troponins.     Action/Plan:   tbd   Anticipated DC Date:  05/05/2014   Anticipated DC Plan:  HOME/SELF CARE  In-house referral  NA      DC Planning Services  CM consult      Choice offered to / List presented to:             Status of service:  In process, will continue to follow Medicare Important Message given?  YES (If response is "NO", the following Medicare IM given date fields will be blank) Date Medicare IM given:  05/04/2014 Medicare IM given by:  Linzy Darling Date Additional Medicare IM given:   Additional Medicare IM given by:    Discharge Disposition:    Per UR Regulation:  Reviewed for med. necessity/level of care/duration of stay  If discussed at [place] of Stay Meetings, dates discussed:    Comments:  05/04/14 [place] MSN BSN CCM Pt transferred to [facility] 2/2 worsening renal function/potential need for HD.  Lives with dtr and her family, has cane, walker, and BSC.  [place], [place], BSN, [place] [phone] Chart Reviewed. Discharge needs at time of review: None present will follow for needs.

## 2014-05-04 NOTE — Progress Notes (Signed)
  [facility] Progress Note   Subjective: remains confused  Filed Vitals:   05/03/14 2338 05/04/14 0428 05/04/14 0500 05/04/14 0819  BP: 121/65 118/48  138/60  Pulse: 75 67  71  Temp: 97.6 F (36.4 C) 98 F (36.7 C)  97.5 F (36.4 C)  TempSrc: Oral Axillary  Oral  Resp: 11 16  19   Height:      Weight:   107.2 kg (236 lb 5.3 oz)   SpO2: 98% 100%  100%   Exam: Elderly AAM, no distress, frail, pleasant No jvd Chest clear bilat RRR no MRG Abd obese, NTND, no mass or ascites, no HSM GU foley w small amount clear urine today 2+ diffuse edema dependent LE's and RUE Neuro alert, responsive, oriented to place not year  UA (undersigned) > large Hb, 2+ protein, 3+ WBC (20-50 per hpf) and diffuse bacteria, 5-10 rbc, moderate gran casts Last creat 1.01 in Jan 2016   Assessment: 1. Acute renal failure - ATN due to shock/ ARB.  Remains oliguric and creat rising. No gross uremic symptoms.  He is a marginal long-term dialysis candidate. Would try and avoid short-term HD in this frail elderly patient also, but if he becomes uremic we will have to decide what to do. Have d/w daughter, will consult palliative care for [place].  2. Volume excess / edema - will stop IVF's 3. HTN home meds on hold 4. Blind L eye 5. DM 6. Hx falls / debility        Plan - as above    Kelly Splinter MD  pager [phone]    cell [phone]  05/04/2014, 10:03 AM     Recent Labs Lab 05/01/14 0838 05/02/14 0405 05/03/14 0330 05/03/14 1330  NA  --  137 140 140  K  --  4.7 4.8 4.8  CL  --  108 110 109  CO2  --  20 20 19   GLUCOSE  --  152* 108* 141*  BUN  --  50* 62* 68*  CREATININE  --  3.26* 4.78* 5.40*  CALCIUM  --  7.7* 8.0* 8.0*  PHOS 5.3*  --   --  5.4*    Recent Labs Lab 05/01/14 0756 05/02/14 0405 05/03/14 0330 05/03/14 1330  AST 284* 620* 414*  --   ALT 105* 208* 183*  --   ALKPHOS 82 67 57  --   BILITOT 0.5 0.6 0.5  --   PROT 7.3 5.4* 5.2*  --   ALBUMIN 3.7 2.5* 2.4*  2.3*    Recent Labs Lab 05/02/14 0405 05/03/14 0330 05/04/14 0335  WBC 9.6 7.8 7.7  HGB 11.5* 10.3* 9.8*  HCT 35.6* 31.4* 28.5*  MCV 81.1 81.3 78.7  PLT 182 187 161   . aspirin EC  81 mg Oral Daily  . brimonidine  1 drop Right Eye BID  . docusate sodium  100 mg Oral BID  . heparin  5,000 Units Subcutaneous 3 times per day  . nystatin   Topical BID  . piperacillin-tazobactam (ZOSYN)  IV  2.25 g Intravenous Q8H  . sodium bicarbonate  50 mEq Intravenous Once  . sodium chloride  3 mL Intravenous Q12H   . sodium chloride 65 mL/hr at 05/03/14 2241   albuterol, bisacodyl, ondansetron **OR** ondansetron (ZOFRAN) IV, senna-docusate

## 2014-05-04 NOTE — Progress Notes (Signed)
Speech Language Pathology Treatment: Dysphagia  Patient Details Name: Curtis Brock MRN: [id number] DOB: [birth year] Today's Date: 05/04/2014 Time: 5809-9833 SLP Time Calculation (min) (ACUTE ONLY): 14 min  Assessment / Plan / Recommendation Clinical Impression  Pt tolerating diet well when given assist for small sips and providing soft moist solids; no signs of aspiration observed. Reinforced precautions with pt and daughter, posted sign in room to remind staff to avoid straws. No SLP f/u needed at this time, all education complete, continue Dys 3 (mech soft) thin liquids, sign off.    HPI HPI: 81 y.o. male, with past medical history of diabetes mellitus, CVA, hyperlipidemia, altered mental status, shunt admitted with increased lethargy and confusion than baseline. Pt found to be in acute renal failure. CT head with no acute findings. CXR No acute cardiopulmonary disease. MBS 06/17/12 Mild oral phase dysphagia;Mild pharyngeal phase dysphagia;Mild cervical esophageal phase dysphagia; cervical osteophytes at C2-C3, C3-C4, C4-C5, C5-C6 mildly impairing barium flow into esophagus (C5-C6 mostly impacting swallow). Dys 3 and thin liquids recommended.   Pertinent Vitals    SLP Plan  Discharge SLP treatment due to (comment);All goals met    Recommendations Diet recommendations: Dysphagia 3 (mechanical soft);Thin liquid Liquids provided via: Cup;No straw Medication Administration: Whole meds with puree Supervision: Full supervision/cueing for compensatory strategies;Staff to assist with self feeding Compensations: Slow rate;Small sips/bites Postural Changes and/or Swallow Maneuvers: Seated upright 90 degrees              Oral Care Recommendations: Oral care BID Follow up Recommendations: None Plan: Discharge SLP treatment due to (comment);All goals met    GO     Edmond Ginsberg, Katherene Ponto 05/04/2014, 11:24 AM

## 2014-05-04 NOTE — Clinical Social Work Note (Signed)
Clinical Social Work Assessment  Patient Details  Name: Curtis Brock MRN: [id number] Date of Birth: 15-Sep-1925  Date of referral:  05/04/14               Reason for consult:  Facility Placement                Permission sought to share information with:  Curtis Brock granted to share information::  Yes, Verbal Permission Granted  Name::     Curtis Brock::     Relationship::  daughter  Contact Information:     Housing/Transportation Living arrangements for the past 2 months:  Single Family Home Source of Information:  Patient Patient Interpreter Needed:  None Criminal Activity/Legal Involvement Pertinent to Current Situation/Hospitalization:  No - Comment as needed Significant Relationships:  Adult Children Lives with:  Adult Children Do you feel safe going back to the place where you live?  Yes Need for family participation in patient care:  Yes (Comment)  Care giving concerns:  Pt lives at home with adult daughter- unclear how much support she is able to provide for pt though RN reports pt dtr has been present at bedside often   Social Worker assessment / plan:  CSW spoke with pt concerning PT recommendation for SNF placement at time of DC.  Pt is agreeable and stated that he has been at SNF in the past Curtis Brock) and was willing to go again.  Employment status:  Retired Nurse, adult PT Recommendations:  Curtis Brock  Patient/Family's Response to care:  Patient is agreeable to SNF placement.  Patient/Family's Understanding of and Emotional Response to Diagnosis, Current Treatment, and Prognosis:  Hard to assess pt response to medical diagnosis- pt was lethargic during interview and gave short answers  Emotional Assessment Appearance:  Appears stated age Attitude/Demeanor/Rapport:    Affect (typically observed):  Accepting, Calm  (lethargic) Orientation:  Oriented to Self, Oriented to Place, Oriented to  Time, Oriented to Situation Alcohol / Substance use:  Not Applicable Psych involvement (Current and /or in the community):  No (Comment)  Discharge Needs  Concerns to be addressed:  Discharge Planning Concerns Readmission within the last 30 days:  No Current discharge risk:  Physical Impairment Barriers to Discharge:  Continued Medical Work up   [employer], LCSW 05/04/2014, 2:16 PM

## 2014-05-04 NOTE — Clinical Social Work Placement (Signed)
   CLINICAL SOCIAL WORK PLACEMENT  NOTE  Date:  05/04/2014  Patient Details  Name: Curtis Brock MRN: [id number] Date of Birth: [birth year]  Clinical Social Work is seeking post-discharge placement for this patient at the [place] level of care (*CSW will initial, date and re-position this form in  chart as items are completed):  Yes   Patient/family provided with [place] Work Department's list of facilities offering this level of care within the geographic area requested by the patient (or if unable, by the patient's family).  Yes   Patient/family informed of their freedom to choose among providers that offer the needed level of care, that participate in Medicare, Medicaid or managed care program needed by the patient, have an available bed and are willing to accept the patient.  Yes   Patient/family informed of [place]'s ownership interest in [facility] and [facility], as well as of the fact that they are under no obligation to receive care at these facilities.  PASRR submitted to EDS on       PASRR number received on       Existing PASRR number confirmed on 05/04/14     FL2 transmitted to all facilities in geographic area requested by pt/family on 05/04/14     FL2 transmitted to all facilities within larger geographic area on       Patient informed that his/her managed care company has contracts with or will negotiate with certain facilities, including the following:            Patient/family informed of bed offers received.  Patient chooses bed at       Physician recommends and patient chooses bed at      Patient to be transferred to   on  .  Patient to be transferred to facility by       Patient family notified on   of transfer.  Name of family member notified:        PHYSICIAN Please sign FL2     Additional Comment:    _______________________________________________ [place] Mon, LCSW 05/04/2014, 2:21 PM

## 2014-05-04 NOTE — Progress Notes (Signed)
Physical Therapy Treatment Patient Details Name: Curtis Brock MRN: [id number] DOB: 1925/09/07 Today's Date: 05/04/2014    History of Present Illness 81 yo male admitted with SIRS, sepsis, AMS. Hx of DM,CVA    PT Comments    Pt admitted with above diagnosis. Pt currently with functional limitations due to balance and endurance deficits as well a weakness. Pt will need continued therapy.  Limited by extreme fatigue today with pt actually even resisting movement as well as lying in BM and had to be cleaned.  Pt fatigued just rolling.   Pt will benefit from skilled PT to increase their independence and safety with mobility to allow discharge to the venue listed below.    Follow Up Recommendations  SNF;Supervision/Assistance - 24 hour     Equipment Recommendations  None recommended by PT    Recommendations for Other Services OT consult     Precautions / Restrictions Precautions Precautions: Fall Restrictions Weight Bearing Restrictions: No    Mobility  Bed Mobility Overal bed mobility: Needs Assistance Bed Mobility: Rolling;Sidelying to Sit Rolling: Total assist;+2 for physical assistance Sidelying to sit: Total assist;+2 for physical assistance       General bed mobility comments: Pt cannot roll even with rails on his own.  Total assist.  Has diffculty initiating movement.  Could not hold himself on his side.  Attempted to sit pt to EOB and could not due to pt resisting.  Assisted nursing with cleaning pt as he was lying in stool on arrival.  Changed all pts linens and pt cleaned with total assist.   Transfers                 General transfer comment: unable to get pt to EOB with +2 assist.   Ambulation/Gait                 Stairs            Wheelchair Mobility    Modified Rankin (Stroke Patients Only)       Balance Overall balance assessment: Needs assistance;History of Falls     Sitting balance - Comments: unable to assist pt to EOB today. Pt  resisting.                            Cognition Arousal/Alertness: Lethargic Behavior During Therapy: Flat affect Overall Cognitive Status: History of cognitive impairments - at baseline Area of Impairment: Orientation;Attention;Following commands;Safety/judgement;Problem solving Orientation Level: Place;Time;Situation Current Attention Level: Sustained   Following Commands: Follows one step commands inconsistently Safety/Judgement: Decreased awareness of safety;Decreased awareness of deficits   Problem Solving: Slow processing;Decreased initiation;Difficulty sequencing;Requires verbal cues;Requires tactile cues      Exercises      General Comments General comments (skin integrity, edema, etc.): Nursing aware of breakdown on pts bottom.       Pertinent Vitals/Pain Pain Assessment: Faces Faces Pain Scale: Hurts whole lot Pain Location: bil Les Pain Descriptors / Indicators: Grimacing;Sore Pain Intervention(s): Limited activity within patient's tolerance;Monitored during session;Premedicated before session;Repositioned  VSS.      Home Living                      Prior Function            PT Goals (current goals can now be found in the care plan section) Acute Rehab PT Goals Patient Stated Goal: none stated Progress towards PT goals: Not progressing toward goals - comment (lying  in BM and not able to sit up with +2 assist. )    Frequency  Min 3X/week    PT Plan Current plan remains appropriate    Co-evaluation             End of Session   Activity Tolerance: Patient limited by fatigue;Patient limited by pain Patient left: in bed;with call bell/phone within reach;with bed alarm set;with family/visitor present     Time: 1007-1030 PT Time Calculation (min) (ACUTE ONLY): 23 min  Charges:  $Therapeutic Activity: 8-22 mins $Self Care/Home Management: 8-22                    G CodesDenice Paradise 02-Jun-2014, 2:07 PM Kahdijah Errickson,PT Acute Rehabilitation [phone] (pager)

## 2014-05-05 DIAGNOSIS — E11621 Type 2 diabetes mellitus with foot ulcer: Secondary | ICD-10-CM

## 2014-05-05 DIAGNOSIS — L97529 Non-pressure chronic ulcer of other part of left foot with unspecified severity: Secondary | ICD-10-CM

## 2014-05-05 DIAGNOSIS — I10 Essential (primary) hypertension: Secondary | ICD-10-CM

## 2014-05-05 DIAGNOSIS — B369 Superficial mycosis, unspecified: Secondary | ICD-10-CM

## 2014-05-05 DIAGNOSIS — L89152 Pressure ulcer of sacral region, stage 2: Secondary | ICD-10-CM

## 2014-05-05 DIAGNOSIS — N179 Acute kidney failure, unspecified: Secondary | ICD-10-CM

## 2014-05-05 DIAGNOSIS — E875 Hyperkalemia: Secondary | ICD-10-CM

## 2014-05-05 DIAGNOSIS — T68XXXA Hypothermia, initial encounter: Secondary | ICD-10-CM

## 2014-05-05 DIAGNOSIS — R4182 Altered mental status, unspecified: Secondary | ICD-10-CM | POA: Diagnosis present

## 2014-05-05 DIAGNOSIS — I639 Cerebral infarction, unspecified: Secondary | ICD-10-CM

## 2014-05-05 DIAGNOSIS — G9340 Encephalopathy, unspecified: Secondary | ICD-10-CM

## 2014-05-05 DIAGNOSIS — R410 Disorientation, unspecified: Secondary | ICD-10-CM

## 2014-05-05 DIAGNOSIS — E119 Type 2 diabetes mellitus without complications: Secondary | ICD-10-CM | POA: Diagnosis present

## 2014-05-05 DIAGNOSIS — Z66 Do not resuscitate: Secondary | ICD-10-CM

## 2014-05-05 DIAGNOSIS — Z515 Encounter for palliative care: Secondary | ICD-10-CM

## 2014-05-05 LAB — CBC
HCT: 28.3 % — ABNORMAL LOW (ref 39.0–52.0)
Hemoglobin: 9.8 g/dL — ABNORMAL LOW (ref 13.0–17.0)
MCH: 26.9 pg (ref 26.0–34.0)
MCHC: 34.6 g/dL (ref 30.0–36.0)
MCV: 77.7 fL — AB (ref 78.0–100.0)
PLATELETS: 197 10*3/uL (ref 150–400)
RBC: 3.64 MIL/uL — ABNORMAL LOW (ref 4.22–5.81)
RDW: 13.5 % (ref 11.5–15.5)
WBC: 6.5 10*3/uL (ref 4.0–10.5)

## 2014-05-05 LAB — GLUCOSE, CAPILLARY
GLUCOSE-CAPILLARY: 121 mg/dL — AB (ref 70–99)
GLUCOSE-CAPILLARY: 158 mg/dL — AB (ref 70–99)
Glucose-Capillary: 126 mg/dL — ABNORMAL HIGH (ref 70–99)
Glucose-Capillary: 167 mg/dL — ABNORMAL HIGH (ref 70–99)
Glucose-Capillary: 161 mg/dL — ABNORMAL HIGH (ref 70–99)

## 2014-05-05 LAB — COMPREHENSIVE METABOLIC PANEL
ALBUMIN: 2.1 g/dL — AB (ref 3.5–5.2)
ALK PHOS: 61 U/L (ref 39–117)
ALT: 140 U/L — ABNORMAL HIGH (ref 0–53)
ANION GAP: 13 (ref 5–15)
AST: 177 U/L — ABNORMAL HIGH (ref 0–37)
BILIRUBIN TOTAL: 0.6 mg/dL (ref 0.3–1.2)
BUN: 81 mg/dL — AB (ref 6–23)
CHLORIDE: 105 mmol/L (ref 96–112)
CO2: 18 mmol/L — ABNORMAL LOW (ref 19–32)
Calcium: 8.3 mg/dL — ABNORMAL LOW (ref 8.4–10.5)
Creatinine, Ser: 7.26 mg/dL — ABNORMAL HIGH (ref 0.50–1.35)
GFR calc non Af Amer: 6 mL/min — ABNORMAL LOW (ref >90)
GFR, EST AFRICAN AMERICAN: 7 mL/min — AB (ref >90)
GLUCOSE: 117 mg/dL — AB (ref 70–99)
POTASSIUM: 5.2 mmol/L — AB (ref 3.5–5.1)
Sodium: 136 mmol/L (ref 135–145)
Total Protein: 5.1 g/dL — ABNORMAL LOW (ref 6.0–8.3)

## 2014-05-05 MED ORDER — SODIUM POLYSTYRENE SULFONATE 15 GM/60ML PO SUSP
45.0000 g | Freq: Once | ORAL | Status: AC
  Administered 2014-05-05: 45 g via ORAL
  Filled 2014-05-05: qty 180

## 2014-05-05 NOTE — Progress Notes (Signed)
[place] TEAM 1 - Stepdown/ICU TEAM Progress Note  Curtis Brock [name] DOB: August 31, 1925 DOA: 05/01/2014 PCP: Jenny Reichmann, MD  Admit HPI / Brief Narrative: 81 y.o. 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 In ED workup was significant for acute renal failure with a creatinine of 2, hypothermia with temperature of 92.4, urinalysis was negative, no acute finding on chest x-ray, blood cultures were sent, CT head with no acute findings.   Patient's crt continued to increase after admission to [facility], he remained anuric despite being +7000 mL positive, and was therefore transferred to [facility] for possible need of hemodialysis in near future.  HPI/Subjective: 4/21 A/O 3 (did not know when), was able to answer all the questions appropriately. Negative CP, negative SOB, negative N/V  Assessment/Plan:  Acute renal failure / anuric. -Nephrology following - family reporting some urinary output noted today - unfortunately creatinine continues to climb at this time -Strict in and out since admission +7.7 L -Per nephrology after speaking with patient and family no HD  SIRS -Patient meets SIRS criteria on admission given his hypothermia, tachypnea. -No convincing radiographic or clinical evidence of infection - stopping antibiotics, if patient spikes fever or leukocytosis will panculture and restart antibiotics  Elevated LFTs -ultrasound showing 1 cm gallstone lodged in the gallbladder neck with no evidence of cholecystitis, CT abdomen pelvis without contrast with no acute finding. -No pain in right upper quadrant on physical exam. -LFTs trending down - recheck in a.m.  Pressure ulcers/deep tissue injury in sacral area -Wound care following  Acute encephalopathy - CT head with no acute findings - most likely metabolic encephalopathy secondary to acute renal failure -ammonia within  normal limits - Rapidly improving  History of CVA - Resume aspirin when able to take oral consistently  Fungal rash in groin and abdominal fold areas - Treated with Diflucan, continue with nystatin powder  2 runs of NSVT - 2-D echo is poor quality, but findings suggestive of overall EF is within normal limit. - No recurrence on telemetry - No significant elevation in troponin  Essential hypertension -At 81 years old BP is appropriate, monitor closely  Pulmonary hypertension -See essential hypertension  Diabetes mellitus - Does not take any home medication, CBGs are controlled without insulin. -Hemoglobin A1c pending -Lipid panel pending  Hyperkalemia -Kayexalate 45 g 1    Code Status: FULL Family Communication: no family present at time of exam Disposition Plan: SNF    Consultants: Dr.Robert Doctor, hospital (nephrology)    Procedure/Significant Events: 4/17 CT head without contrast;-no acute findings-small old lacune infarcts the right base a ganglia and left periventricular white matter.  4/17 ultrasound RUQ;1.0 cm gallstone lodged in the gallbladder neck with no evidence of cholecystitis.  4/18 CT abdomen pelvis without contrast;No acute intra-abdominal or intrapelvic abnormalities. -Minimal RIGHT pleural effusion and basilar atelectasis. -Cholelithiasis with otherwise unremarkable gallbladder.-Sigmoid diverticulosis. -Umbilical hernia containing fat. 4/18 echocardiogram;- Left ventricle: suggest that overall EF is probably normal. -(grade 1diastolic dysfunction). - Pulmonary arteries: PA peak pressure: 39 mm Hg (S).  Culture   Antibiotics: Zosyn 4/17 > 4/20 Vancomycin 4/17   DVT prophylaxis: Subcutaneous heparin   Devices    LINES / TUBES:      Continuous Infusions:   Objective: VITAL SIGNS: Temp: 97.5 F (36.4 C) (04/21 1700) Temp Source: Oral (04/21 1700) BP: 101/81 mmHg (04/21 1700) Pulse Rate: 85 (04/21  1700) SPO2; FIO2:  Intake/Output Summary (Last 24 hours) at 05/05/14 1931 Last data filed at 05/05/14 1700  Gross per 24 hour  Intake    200 ml  Output    125 ml  Net     75 ml     Exam: General:  A/O 3 (did not know when), No acute respiratory distress Lungs: Clear to auscultation bilaterally without wheezes or crackles Cardiovascular: Regular rate and rhythm without murmur gallop or rub normal S1 and S2 Abdomen: Nontender, nondistended, soft, bowel sounds positive, no rebound, no ascites, no appreciable mass Extremities: No significant cyanosis, clubbing, or edema bilateral lower extremities  Data Reviewed: Basic Metabolic Panel:  Recent Labs Lab 05/01/14 0756 05/01/14 0838 05/02/14 0405 05/03/14 0330 05/03/14 1330 05/05/14 0512  NA 136  --  137 140 140 136  K 4.8  --  4.7 4.8 4.8 5.2*  CL 101  --  108 110 109 105  CO2 22  --  20 20 19  18*  GLUCOSE 203*  --  152* 108* 141* 117*  BUN 43*  --  50* 62* 68* 81*  CREATININE 2.02*  --  3.26* 4.78* 5.40* 7.26*  CALCIUM 9.4  --  7.7* 8.0* 8.0* 8.3*  MG  --  2.3  --   --   --   --   PHOS  --  5.3*  --   --  5.4*  --    Liver Function Tests:  Recent Labs Lab 05/01/14 0756 05/02/14 0405 05/03/14 0330 05/03/14 1330 05/05/14 0512  AST 284* 620* 414*  --  177*  ALT 105* 208* 183*  --  140*  ALKPHOS 82 67 57  --  61  BILITOT 0.5 0.6 0.5  --  0.6  PROT 7.3 5.4* 5.2*  --  5.1*  ALBUMIN 3.7 2.5* 2.4* 2.3* 2.1*   No results for input(s): LIPASE, AMYLASE in the last 168 hours.  Recent Labs Lab 05/01/14 0838  AMMONIA 25   CBC:  Recent Labs Lab 05/01/14 0756 05/02/14 0405 05/03/14 0330 05/04/14 0335 05/05/14 0512  WBC 10.2 9.6 7.8 7.7 6.5  HGB 14.4 11.5* 10.3* 9.8* 9.8*  HCT 43.4 35.6* 31.4* 28.5* 28.3*  MCV 81.1 81.1 81.3 78.7 77.7*  PLT 191 182 187 161 197   Cardiac Enzymes:  Recent Labs Lab 05/01/14 0854 05/01/14 1207 05/01/14 1754 05/01/14 2255  TROPONINI <0.03 <0.03 0.04* 0.04*   BNP (last  3 results) No results for input(s): BNP in the last 8760 hours.  ProBNP (last 3 results) No results for input(s): PROBNP in the last 8760 hours.  CBG:  Recent Labs Lab 05/03/14 2338 05/04/14 1702 05/05/14 0028 05/05/14 0757 05/05/14 1438  GLUCAP 107* 150* 121* 126* 167*    Recent Results (from the past 240 hour(s))  Culture, blood (routine x 2)     Status: None (Preliminary result)   Collection Time: 05/01/14  7:56 AM  Result Value Ref Range Status   Specimen Description BLOOD LEFT HAND  Final   Special Requests BOTTLES DRAWN AEROBIC ONLY 4 CC  Final   Culture   Final           BLOOD CULTURE RECEIVED NO GROWTH TO DATE CULTURE WILL BE HELD FOR 5 DAYS BEFORE ISSUING A FINAL NEGATIVE REPORT Performed at [facility]    Report Status PENDING  Incomplete  Culture, blood (routine x 2)     Status: None (Preliminary result)   Collection Time: 05/01/14  7:56 AM  Result Value Ref Range Status  Specimen Description BLOOD RIGHT HAND  Final   Special Requests BOTTLES DRAWN AEROBIC AND ANAEROBIC 3 CC EACH  Final   Culture   Final           BLOOD CULTURE RECEIVED NO GROWTH TO DATE CULTURE WILL BE HELD FOR 5 DAYS BEFORE ISSUING A FINAL NEGATIVE REPORT Performed at [facility]    Report Status PENDING  Incomplete  MRSA PCR Screening     Status: None   Collection Time: 05/01/14 11:03 AM  Result Value Ref Range Status   MRSA by PCR NEGATIVE NEGATIVE Final    Comment:        The GeneXpert MRSA Assay (FDA approved for NASAL specimens only), is one component of a comprehensive MRSA colonization surveillance program. It is not intended to diagnose MRSA infection nor to guide or monitor treatment for MRSA infections.   Culture, Urine     Status: None   Collection Time: 05/03/14  5:19 AM  Result Value Ref Range Status   Specimen Description URINE, CATHETERIZED  Final   Special Requests NONE  Final   Colony Count NO GROWTH Performed at [facility]   Final    Culture NO GROWTH Performed at [facility]   Final   Report Status 05/04/2014 FINAL  Final     Studies:  Recent x-ray studies have been reviewed in detail by the Attending Physician  Scheduled Meds:  Scheduled Meds: . aspirin EC  81 mg Oral Daily  . brimonidine  1 drop Right Eye BID  . docusate sodium  100 mg Oral BID  . heparin  5,000 Units Subcutaneous 3 times per day  . nystatin   Topical BID  . sodium chloride  3 mL Intravenous Q12H  . sodium polystyrene  45 g Oral Once    Time spent on care of this patient: 40 mins   WOODS, Geraldo Docker , MD  [facility] Office  [phone] Pager - [phone]  On-Call/Text Page:      [email or web address]      password [email or web address]  If 7PM-7AM, please contact night-coverage [email or web address] Password [email or web address] 05/05/2014, 7:31 PM   LOS: 4 days   Care during the described time interval was provided by me .  I have reviewed this patient's available data, including medical history, events of note, physical examination, radiology studies and test results as part of my evaluation  Dia Crawford, MD [phone] Pager

## 2014-05-05 NOTE — Progress Notes (Addendum)
[facility] Progress Note   Subjective: remains confused  Filed Vitals:   05/05/14 0757 05/05/14 0900 05/05/14 1200 05/05/14 1700  BP: 135/73 164/70 142/64 101/81  Pulse: 73 71 73 85  Temp: 97.7 F (36.5 C)  97.3 F (36.3 C) 97.5 F (36.4 C)  TempSrc: Oral  Axillary Oral  Resp: 16 18 15 17   Height:      Weight:      SpO2: 100% 99% 100% 100%   Exam: Elderly AAM, no distress, frail, pleasant No jvd Chest clear bilat RRR no MRG Abd obese, NTND, no mass or ascites, no HSM GU foley w small amount clear urine today 2+ diffuse edema dependent LE's and RUE Neuro alert, responsive, oriented to place not year  UA (undersigned) > large Hb, 2+ protein, 3+ WBC (20-50 per hpf) and diffuse bacteria, 5-10 rbc, moderate gran casts Last creat 1.01 in Jan 2016   Assessment: 1. Acute renal failure - ATN due to shock/ ARB.  Remains oliguric and creat rising, although he is making some urine now and it has cleared up the volume is still small. He is doing remarkably well considering w/o n/v or MS changes. I spoke to the daughter to see how aggressive she would want to be regarding dialysis; i told her I was not convinced he was strong enough to withstand dialysis but wanted family input. She deferred to her father saying it was his choice.  I did not encourage dialysis as I feel it would likely be very difficult for the patient and the risk of side effects is very high.  I told the patient the options including possible death if no dialysis was done and also the potential risks of doing dialysis given his frail condition but gave him the choice and he said he wanted to go the route with "no dialysis".  Continue conservative management.  2. Volume excess / edema - stopped IVF's 3. HTN home meds on hold 4. Blind L eye 5. DM 6. Hx falls / debility        Plan - as above    Kelly Splinter MD  pager [phone]    cell [phone]  05/05/2014, 7:06 PM     Recent Labs Lab  05/01/14 0838  05/03/14 0330 05/03/14 1330 05/05/14 0512  NA  --   < > 140 140 136  K  --   < > 4.8 4.8 5.2*  CL  --   < > 110 109 105  CO2  --   < > 20 19 18*  GLUCOSE  --   < > 108* 141* 117*  BUN  --   < > 62* 68* 81*  CREATININE  --   < > 4.78* 5.40* 7.26*  CALCIUM  --   < > 8.0* 8.0* 8.3*  PHOS 5.3*  --   --  5.4*  --   < > = values in this interval not displayed.  Recent Labs Lab 05/02/14 0405 05/03/14 0330 05/03/14 1330 05/05/14 0512  AST 620* 414*  --  177*  ALT 208* 183*  --  140*  ALKPHOS 67 57  --  61  BILITOT 0.6 0.5  --  0.6  PROT 5.4* 5.2*  --  5.1*  ALBUMIN 2.5* 2.4* 2.3* 2.1*    Recent Labs Lab 05/03/14 0330 05/04/14 0335 05/05/14 0512  WBC 7.8 7.7 6.5  HGB 10.3* 9.8* 9.8*  HCT 31.4* 28.5* 28.3*  MCV 81.3 78.7 77.7*  PLT 187 161 197   .  aspirin EC  81 mg Oral Daily  . brimonidine  1 drop Right Eye BID  . docusate sodium  100 mg Oral BID  . heparin  5,000 Units Subcutaneous 3 times per day  . nystatin   Topical BID  . sodium chloride  3 mL Intravenous Q12H     bisacodyl, ondansetron **OR** ondansetron (ZOFRAN) IV, senna-docusate

## 2014-05-05 NOTE — Progress Notes (Signed)
Physical Therapy Treatment Patient Details Name: Curtis Brock MRN: [id number] DOB: [birth year] Today's Date: 05/05/2014    History of Present Illness 81 yo male admitted with SIRS, sepsis, AMS. Hx of DM,CVA    PT Comments    Pt admitted with above diagnosis. Pt currently with functional limitations due to balance and endurance deficits. Pt performing better today overall with ability to stand.  Still very weak and daughter concerned. MD came in while PT in room and pt having difficulty with urine and MD talking with pt about whether he wants dialysis or not.  Pt stated he did not.  Will continue as pt able.   Pt will benefit from skilled PT to increase their independence and safety with mobility to allow discharge to the venue listed below.    Follow Up Recommendations  SNF;Supervision/Assistance - 24 hour     Equipment Recommendations  None recommended by PT    Recommendations for Other Services       Precautions / Restrictions Precautions Precautions: Fall Restrictions Weight Bearing Restrictions: No    Mobility  Bed Mobility Overal bed mobility: Needs Assistance Bed Mobility: Rolling;Sidelying to Sit Rolling: Max assist;+2 for physical assistance Sidelying to sit: Max assist;+2 for physical assistance       General bed mobility comments: Pt needed assist for LEs and for elevation of trunk.    Transfers Overall transfer level: Needs assistance Equipment used: Rolling walker (2 wheeled) Transfers: Sit to/from [facility] Sit to Stand: From elevated surface;+2 physical assistance;Max assist;Mod assist Stand pivot transfers: Max assist;+2 physical assistance       General transfer comment: Pt needed cues for hand placement and physical assist for sit to stand.  Pt needed incr assist to achieve standing with assist for forward lean.  Pt not standing up fully with hips and knees flexed partially.  Noted once pt stood, BM on pad and pt not standing fully  upright.  Sat pt down and obtained a third person to clean pt while PT and tech held pt and once cleaned, pt was able to take a few pivotal steps to recliner with assist for upright posture as well as assist for weight shifting bil to step around to chair.  Pt with uncontrolled descent into chair needing assist.    Ambulation/Gait                 Stairs            Wheelchair Mobility    Modified Rankin (Stroke Patients Only)       Balance Overall balance assessment: Needs assistance;History of Falls Sitting-balance support: Bilateral upper extremity supported;Feet supported Sitting balance-Leahy Scale: Poor Sitting balance - Comments: Sat EOB with bil UE support today with min guard assist for 5 min   Standing balance support: Bilateral upper extremity supported;During functional activity Standing balance-Leahy Scale: Poor Standing balance comment: Pt requires bil UE support and assist of 2 persons as well.                     Cognition Arousal/Alertness: Awake/alert Behavior During Therapy: Flat affect Overall Cognitive Status: History of cognitive impairments - at baseline Area of Impairment: Orientation;Attention;Following commands;Safety/judgement;Problem solving Orientation Level: Place;Time;Situation Current Attention Level: Sustained   Following Commands: Follows one step commands inconsistently Safety/Judgement: Decreased awareness of safety;Decreased awareness of deficits   Problem Solving: Slow processing;Decreased initiation;Difficulty sequencing;Requires verbal cues;Requires tactile cues      Exercises General Exercises - Lower Extremity Long Arc Quad:  AROM;Both;10 reps;Seated    General Comments        Pertinent Vitals/Pain Pain Assessment: Faces Faces Pain Scale: Hurts whole lot Pain Location: bil LEs Pain Descriptors / Indicators: Grimacing;Sore Pain Intervention(s): Limited activity within patient's tolerance;Monitored during  session;Repositioned  HR 72-80 bpm, 99% on 2LO2, 17 RR, 96/40 BP to 122/59 at end of treatment.     Home Living                      Prior Function            PT Goals (current goals can now be found in the care plan section) Progress towards PT goals: Progressing toward goals    Frequency  Min 3X/week    PT Plan Current plan remains appropriate    Co-evaluation             End of Session Equipment Utilized During Treatment: Gait belt;Oxygen Activity Tolerance: Patient limited by fatigue;Patient limited by pain Patient left: in chair;with call bell/phone within reach;with family/visitor present     Time: [id number] PT Time Calculation (min) (ACUTE ONLY): 26 min  Charges:  $Therapeutic Activity: 8-22 mins $Self Care/Home Management: 10-02-2022                    G Codes:      Denice Paradise 06/01/14, 2:11 PM Simra Fiebig,PT Acute Rehabilitation [phone] (pager)

## 2014-05-06 DIAGNOSIS — I639 Cerebral infarction, unspecified: Secondary | ICD-10-CM | POA: Diagnosis present

## 2014-05-06 DIAGNOSIS — E872 Acidosis, unspecified: Secondary | ICD-10-CM | POA: Diagnosis present

## 2014-05-06 DIAGNOSIS — M545 Low back pain, unspecified: Secondary | ICD-10-CM | POA: Diagnosis present

## 2014-05-06 DIAGNOSIS — I270 Primary pulmonary hypertension: Secondary | ICD-10-CM

## 2014-05-06 DIAGNOSIS — I272 Pulmonary hypertension, unspecified: Secondary | ICD-10-CM | POA: Diagnosis present

## 2014-05-06 DIAGNOSIS — L89159 Pressure ulcer of sacral region, unspecified stage: Secondary | ICD-10-CM | POA: Diagnosis present

## 2014-05-06 LAB — LIPID PANEL
Cholesterol: 143 mg/dL (ref 0–200)
HDL: 37 mg/dL — AB (ref >39)
LDL CALC: 85 mg/dL (ref 0–99)
Total CHOL/HDL Ratio: 3.9 RATIO
Triglycerides: 105 mg/dL (ref <150)
VLDL: 21 mg/dL (ref 0–40)

## 2014-05-06 LAB — COMPREHENSIVE METABOLIC PANEL
ALBUMIN: 2 g/dL — AB (ref 3.5–5.2)
ALT: 124 U/L — ABNORMAL HIGH (ref 0–53)
AST: 122 U/L — AB (ref 0–37)
Alkaline Phosphatase: 59 U/L (ref 39–117)
Anion gap: 13 (ref 5–15)
BUN: 88 mg/dL — ABNORMAL HIGH (ref 6–23)
CALCIUM: 8.2 mg/dL — AB (ref 8.4–10.5)
CO2: 17 mmol/L — AB (ref 19–32)
Chloride: 104 mmol/L (ref 96–112)
Creatinine, Ser: 7.91 mg/dL — ABNORMAL HIGH (ref 0.50–1.35)
GFR calc Af Amer: 6 mL/min — ABNORMAL LOW (ref >90)
GFR calc non Af Amer: 5 mL/min — ABNORMAL LOW (ref >90)
GLUCOSE: 123 mg/dL — AB (ref 70–99)
Potassium: 5.2 mmol/L — ABNORMAL HIGH (ref 3.5–5.1)
Sodium: 134 mmol/L — ABNORMAL LOW (ref 135–145)
Total Bilirubin: 0.6 mg/dL (ref 0.3–1.2)
Total Protein: 5.1 g/dL — ABNORMAL LOW (ref 6.0–8.3)

## 2014-05-06 LAB — GLUCOSE, CAPILLARY
Glucose-Capillary: 122 mg/dL — ABNORMAL HIGH (ref 70–99)
Glucose-Capillary: 162 mg/dL — ABNORMAL HIGH (ref 70–99)
Glucose-Capillary: 172 mg/dL — ABNORMAL HIGH (ref 70–99)
Glucose-Capillary: 128 mg/dL — ABNORMAL HIGH (ref 70–99)

## 2014-05-06 LAB — BASIC METABOLIC PANEL
Anion gap: 14 (ref 5–15)
BUN: 91 mg/dL — AB (ref 6–23)
CHLORIDE: 104 mmol/L (ref 96–112)
CO2: 20 mmol/L (ref 19–32)
Calcium: 8.2 mg/dL — ABNORMAL LOW (ref 8.4–10.5)
Creatinine, Ser: 8.19 mg/dL — ABNORMAL HIGH (ref 0.50–1.35)
GFR calc Af Amer: 6 mL/min — ABNORMAL LOW (ref >90)
GFR calc non Af Amer: 5 mL/min — ABNORMAL LOW (ref >90)
Glucose, Bld: 184 mg/dL — ABNORMAL HIGH (ref 70–99)
Potassium: 4.7 mmol/L (ref 3.5–5.1)
Sodium: 138 mmol/L (ref 135–145)

## 2014-05-06 LAB — MAGNESIUM: Magnesium: 2.1 mg/dL (ref 1.5–2.5)

## 2014-05-06 LAB — AMMONIA: AMMONIA: 17 umol/L (ref 11–32)

## 2014-05-06 MED ORDER — TRAMADOL HCL 50 MG PO TABS
50.0000 mg | ORAL_TABLET | Freq: Four times a day (QID) | ORAL | Status: DC | PRN
Start: 2014-05-06 — End: 2014-05-16
  Administered 2014-05-07 – 2014-05-16 (×3): 50 mg via ORAL
  Filled 2014-05-06 (×3): qty 1

## 2014-05-06 MED ORDER — SODIUM BICARBONATE 8.4 % IV SOLN
100.0000 meq | Freq: Once | INTRAVENOUS | Status: AC
  Administered 2014-05-06: 100 meq via INTRAVENOUS
  Filled 2014-05-06: qty 50

## 2014-05-06 MED ORDER — SODIUM BICARBONATE 650 MG PO TABS
1300.0000 mg | ORAL_TABLET | Freq: Two times a day (BID) | ORAL | Status: DC
  Administered 2014-05-06 – 2014-05-13 (×14): 1300 mg via ORAL
  Filled 2014-05-06 (×16): qty 2

## 2014-05-06 MED ORDER — SODIUM POLYSTYRENE SULFONATE 15 GM/60ML PO SUSP
60.0000 g | Freq: Every day | ORAL | Status: DC
  Administered 2014-05-06: 60 g via ORAL
  Filled 2014-05-06 (×2): qty 240

## 2014-05-06 MED ORDER — FUROSEMIDE 80 MG PO TABS
160.0000 mg | ORAL_TABLET | Freq: Four times a day (QID) | ORAL | Status: AC
  Administered 2014-05-06 (×2): 160 mg via ORAL
  Filled 2014-05-06 (×2): qty 2

## 2014-05-06 NOTE — Progress Notes (Signed)
[facility] TEAM 1 - Stepdown/ICU TEAM Progress Note  Curtis Brock [name] DOB: [birth year] DOA: 05/01/2014 PCP: Jenny Reichmann, MD  Admit HPI / Brief Narrative: 81 y.o. 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 In ED workup was significant for acute renal failure with a creatinine of 2, hypothermia with temperature of 92.4, urinalysis was negative, no acute finding on chest x-ray, blood cultures were sent, CT head with no acute findings.   Patient's crt continued to increase after admission to [facility], he remained anuric despite being +7000 mL positive, and was therefore transferred to [facility] for possible need of hemodialysis in near future.  HPI/Subjective: 4/22 lethargic but arousable, A/O 3 (did not know when), was able to answer all the questions appropriately. Negative CP, negative SOB, negative N/V, states has back pain rated at 5/10  Assessment/Plan:  Acute renal failure / anuric. -Nephrology following - family reporting some urinary output noted today - unfortunately creatinine continues to climb at this time -Strict in and out since admission + 8.2 L -Per nephrology after speaking with patient and family no HD; patient's renal function continues to worsen -Continue Lasix 160 mg QID per nephrology  SIRS -Patient meets SIRS criteria on admission given his hypothermia, tachypnea. -No convincing radiographic or clinical evidence of infection - stopping antibiotics, if patient spikes fever or leukocytosis will panculture and restart antibiotics  Elevated LFTs -ultrasound showing 1 cm gallstone lodged in the gallbladder neck with no evidence of cholecystitis, CT abdomen pelvis without contrast with no acute finding. -No pain in right upper quadrant on physical exam. -LFTs trending down  Pressure ulcers/deep tissue injury in sacral area -Wound care following -Out of bed to  chair daily -ObtainTriadyne bed, and Ensure on rotation setting at all times  Acute encephalopathy - CT head with no acute findings - most likely metabolic encephalopathy secondary to acute renal failure -ammonia within normal limits  History of CVA - Resume aspirin when able to take oral consistently  Fungal rash in groin and abdominal fold areas - Treated with Diflucan, continue with nystatin powder  2 runs of NSVT - 2-D echo is poor quality, but findings suggestive of overall EF is within normal limit. - No recurrence on telemetry - No significant elevation in troponin  Essential hypertension -At 81 years old BP is appropriate, monitor closely  Pulmonary hypertension -See essential hypertension  Diabetes mellitus Type 2 controlled - Does not take any home medication, CBGs are controlled without insulin. -Hemoglobin A1c pending -Lipid panel; LDL slightly elevated by 88 guidelines and HDL low will hold on adding additional medication at this time.   Hyperkalemia -Kayexalate 60 gm 2 doses  Metabolic acidosis -Sodium Bicarbonate 100 mEq 1 -Continue sodium bicarbonate 1300 mg BID -Repeat BMP at 1600  Back pain   -Out of bed to chair q shift -Tramadol 50 mg QID PRN  -Unable to treat pain with NSAIDs or Tylenol secondary patient's renal and liver failure    Code Status: FULL Family Communication: no family present at time of exam Disposition Plan: SNF    Consultants: Dr.Robert Doctor, hospital (nephrology) Dr. Rhea Pink (palliative care)    Procedure/Significant Events: 4/17 CT head without contrast;-no acute findings-small old lacune infarcts the right base a ganglia and left periventricular white matter.  4/17 ultrasound RUQ;1.0 cm gallstone lodged in the gallbladder neck with no evidence of cholecystitis.  4/18 CT abdomen pelvis without contrast;No acute  intra-abdominal or intrapelvic abnormalities. -Minimal RIGHT pleural effusion and basilar  atelectasis. -Cholelithiasis with otherwise unremarkable gallbladder.-Sigmoid diverticulosis. -Umbilical hernia containing fat. 4/18 echocardiogram;- Left ventricle: suggest that overall EF is probably normal. -(grade 1diastolic dysfunction). - Pulmonary arteries: PA peak pressure: 39 mm Hg (S).  Culture   Antibiotics: Zosyn 4/17 > 4/20 Vancomycin 4/17   DVT prophylaxis: Subcutaneous heparin   Devices    LINES / TUBES:      Continuous Infusions:   Objective: VITAL SIGNS: Temp: 97 F (36.1 C) (04/22 0851) Temp Source: Oral (04/22 0851) BP: 126/63 mmHg (04/22 0851) Pulse Rate: 81 (04/22 0851) SPO2; FIO2:   Intake/Output Summary (Last 24 hours) at 05/06/14 1151 Last data filed at 05/06/14 0953  Gross per 24 hour  Intake    600 ml  Output    225 ml  Net    375 ml     Exam: General:  Lethargic, but arousable, A/O 3 (did not know when), No acute respiratory distress, follows all commands Lungs: Clear to auscultation bilaterally without wheezes or crackles Cardiovascular: Regular rate and rhythm without murmur gallop or rub normal S1 and S2 Abdomen: Nontender, nondistended, soft, bowel sounds positive, no rebound, no ascites, no appreciable mass Extremities: No significant cyanosis, clubbing. Bilateral +1 pitting edema to knees.     Data Reviewed: Basic Metabolic Panel:  Recent Labs Lab 05/01/14 0838 05/02/14 0405 05/03/14 0330 05/03/14 1330 05/05/14 0512 05/06/14 0323  NA  --  137 140 140 136 134*  K  --  4.7 4.8 4.8 5.2* 5.2*  CL  --  108 110 109 105 104  CO2  --  20 20 19  18* 17*  GLUCOSE  --  152* 108* 141* 117* 123*  BUN  --  50* 62* 68* 81* 88*  CREATININE  --  3.26* 4.78* 5.40* 7.26* 7.91*  CALCIUM  --  7.7* 8.0* 8.0* 8.3* 8.2*  MG 2.3  --   --   --   --  2.1  PHOS 5.3*  --   --  5.4*  --   --    Liver Function Tests:  Recent Labs Lab 05/01/14 0756 05/02/14 0405 05/03/14 0330 05/03/14 1330 05/05/14 0512 05/06/14 0323  AST  284* 620* 414*  --  177* 122*  ALT 105* 208* 183*  --  140* 124*  ALKPHOS 82 67 57  --  61 59  BILITOT 0.5 0.6 0.5  --  0.6 0.6  PROT 7.3 5.4* 5.2*  --  5.1* 5.1*  ALBUMIN 3.7 2.5* 2.4* 2.3* 2.1* 2.0*   No results for input(s): LIPASE, AMYLASE in the last 168 hours.  Recent Labs Lab 05/01/14 0838 05/06/14 0323  AMMONIA 25 17   CBC:  Recent Labs Lab 05/01/14 0756 05/02/14 0405 05/03/14 0330 05/04/14 0335 05/05/14 0512  WBC 10.2 9.6 7.8 7.7 6.5  HGB 14.4 11.5* 10.3* 9.8* 9.8*  HCT 43.4 35.6* 31.4* 28.5* 28.3*  MCV 81.1 81.1 81.3 78.7 77.7*  PLT 191 182 187 161 197   Cardiac Enzymes:  Recent Labs Lab 05/01/14 0854 05/01/14 1207 05/01/14 1754 05/01/14 2255  TROPONINI <0.03 <0.03 0.04* 0.04*   BNP (last 3 results) No results for input(s): BNP in the last 8760 hours.  ProBNP (last 3 results) No results for input(s): PROBNP in the last 8760 hours.  CBG:  Recent Labs Lab 05/05/14 0757 05/05/14 1438 05/05/14 1651 05/05/14 2118 05/06/14 0847  GLUCAP 126* 167* 158* 161* 122*    Recent Results (from the past 240  hour(s))  Culture, blood (routine x 2)     Status: None (Preliminary result)   Collection Time: 05/01/14  7:56 AM  Result Value Ref Range Status   Specimen Description BLOOD LEFT HAND  Final   Special Requests BOTTLES DRAWN AEROBIC ONLY 4 CC  Final   Culture   Final           BLOOD CULTURE RECEIVED NO GROWTH TO DATE CULTURE WILL BE HELD FOR 5 DAYS BEFORE ISSUING A FINAL NEGATIVE REPORT Performed at [facility]    Report Status PENDING  Incomplete  Culture, blood (routine x 2)     Status: None (Preliminary result)   Collection Time: 05/01/14  7:56 AM  Result Value Ref Range Status   Specimen Description BLOOD RIGHT HAND  Final   Special Requests BOTTLES DRAWN AEROBIC AND ANAEROBIC 3 CC EACH  Final   Culture   Final           BLOOD CULTURE RECEIVED NO GROWTH TO DATE CULTURE WILL BE HELD FOR 5 DAYS BEFORE ISSUING A FINAL NEGATIVE  REPORT Performed at [facility]    Report Status PENDING  Incomplete  MRSA PCR Screening     Status: None   Collection Time: 05/01/14 11:03 AM  Result Value Ref Range Status   MRSA by PCR NEGATIVE NEGATIVE Final    Comment:        The GeneXpert MRSA Assay (FDA approved for NASAL specimens only), is one component of a comprehensive MRSA colonization surveillance program. It is not intended to diagnose MRSA infection nor to guide or monitor treatment for MRSA infections.   Culture, Urine     Status: None   Collection Time: 05/03/14  5:19 AM  Result Value Ref Range Status   Specimen Description URINE, CATHETERIZED  Final   Special Requests NONE  Final   Colony Count NO GROWTH Performed at [facility]   Final   Culture NO GROWTH Performed at [facility]   Final   Report Status 05/04/2014 FINAL  Final     Studies:  Recent x-ray studies have been reviewed in detail by the Attending Physician  Scheduled Meds:  Scheduled Meds: . aspirin EC  81 mg Oral Daily  . brimonidine  1 drop Right Eye BID  . docusate sodium  100 mg Oral BID  . furosemide  160 mg Oral Q6H  . heparin  5,000 Units Subcutaneous 3 times per day  . nystatin   Topical BID  . sodium bicarbonate  100 mEq Intravenous Once  . sodium bicarbonate  1,300 mg Oral BID  . sodium chloride  3 mL Intravenous Q12H  . sodium polystyrene  60 g Oral Daily    Time spent on care of this patient: 40 mins   WOODS, Geraldo Docker , MD  [facility] Office  [phone] Pager - [phone]  On-Call/Text Page:      [email or web address]      password [email or web address]  If 7PM-7AM, please contact night-coverage [email or web address] Password [facility] 05/06/2014, 11:51 AM   LOS: 5 days   Care during the described time interval was provided by me .  I have reviewed this patient's available data, including medical history, events of note, physical examination, radiology studies and test results as part of my  evaluation  Dia Crawford, MD [phone] Pager

## 2014-05-06 NOTE — Progress Notes (Signed)
Subjective: Interval History: has no complaint, appetite ok.would do  HD if needed Objective: Vital signs in last 24 hours: Temp:  [97.3 F (36.3 C)-98.5 F (36.9 C)] 97.9 F (36.6 C) (04/22 0356) Pulse Rate:  [71-85] 80 (04/22 0356) Resp:  [15-19] 19 (04/22 0356) BP: (101-164)/(64-90) 137/76 mmHg (04/22 0356) SpO2:  [99 %-100 %] 100 % (04/22 0356) Weight:  [111.9 kg (246 lb 11.1 oz)] 111.9 kg (246 lb 11.1 oz) (04/22 0356) Weight change: 4.4 kg (9 lb 11.2 oz)  Intake/Output from previous day: 04/21 0701 - 04/22 0700 In: 360 [P.O.:360] Out: 225 [Urine:225] Intake/Output this shift:    General appearance: moderately obese and slow mentation, NAD Back: rales in bases, RR 8 Cardio: S1, S2 normal and systolic murmur: holosystolic 2/6, blowing at apex GI: obese, pos bs, liver down 5 cm Extremities: edema 3+  Lab Results:  Recent Labs  05/04/14 0335 05/05/14 0512  WBC 7.7 6.5  HGB 9.8* 9.8*  HCT 28.5* 28.3*  PLT 161 197   BMET:  Recent Labs  05/05/14 0512 05/06/14 0323  NA 136 134*  K 5.2* 5.2*  CL 105 104  CO2 18* 17*  GLUCOSE 117* 123*  BUN 81* 88*  CREATININE 7.26* 7.91*  CALCIUM 8.3* 8.2*   No results for input(s): PTH in the last 72 hours. Iron Studies: No results for input(s): IRON, TIBC, TRANSFERRIN, FERRITIN in the last 72 hours.  Studies/Results: No results found.  I have reviewed the patient's current medications.  Assessment/Plan: 1 AKI oliguric, solute rising, acidemic and borderline K.  Will use po bicarb. See if responds to Lasix.  Will give 1-2 more d and will need dialysis,  2 Anemia stable 3 CVA 4 DM controlled 5 encephal 6 obesity P Na bicarb, Lasix, follow chem, and urine    LOS: 5 days   Rasul Decola,Schexnayder L 05/06/2014,7:31 AM

## 2014-05-06 NOTE — Consult Note (Signed)
Consultation Note Date: 05/06/2014   Patient Name: Curtis Brock  DOB: 1925/08/06  MRN: [id number]  Age / Sex: 81 y.o., male   PCP: Darlyne Russian, MD Referring Physician: Allie Bossier, MD  Reason for Consultation: Establishing goals of care and Other  Palliative Care Assessment and Plan Summary of Established Goals of Care and Medical Treatment Preferences    Palliative Care Discussion Held Today Contacts/Participants in Discussion: Primary Decision Maker: Patient has capacity in general according to family but some delay in word finding. He states if he is unable to make decisions for himself that decision making should be carried out by his daughter and son-in-law  HCPOA: His daughter per his report-he also supposedly has an advance directive.  I met with Mr. Arp and his son-in-law was present for my visit. We discussed his current medical condition and also his goals of care in general given his age and chronic medical problems as well as the uncertainty of his renal function. He is able to tell me that his kidney's are bad and provide information on his functional status. His son in law provided some of the history-of particular note his son-in-law works at [employer] and tells me he knows a lot about medicine. His son in law had many questions about the etiology of his renal failure-he reports that Mr. Demma was prior to a few days ago highly functional and had "no health problems". He wants to know why scans and labs never showed any problems. He furthr reports that Mr. Demore see's Dr. Everlene Farrier regularly and has always gotten good reports and that his blood an urine never showed any kidney failure. He further asks me how he could have been dehydrated since he was apparently eating and drinking well prior to admission. I explained in detail Renal's assessment. His son-in-law wanted to know why Mr. Dorko would not be a dialysis candidate because he takes people from [place] to  HD who are in a lot worse shape. I shifted the discussion to Mr. Spiller who basically told me teh following, "I have lived a good and long life, I never want to be dependent on any machine to live and I would not want to do HD"-he further tells me he would consider temporary  HD or a trial of HD but he is hesitant-suspect he was feeling some pressure from his son-in-law-additionally his son-in-law had many misconceptions about hospice care and palliative care in general- I sense mistrust but he also seemed open to education. Mr. Roker himself had few words but when specific questions were directed at him he answered appropriately.   Code Status/Advance Care Planning:  DNR-Mr. Scribner was clear on this wish- I confirmed the son-in-law understood that and that they knew I would designate him as DNR.   Symptom Management:  *General discomfort, no overt signs of uremia remarkably although I suspect some cognitive and mental slowing is occuring-possibly some early hypoactive delirium. He denies pain and dyspnea.I suspect he also has diabetic neuropathy-he has a Diabetic Foot Ulcer, Sacral decubitus-?source of infection.  Psycho-social/Spiritual:   Support System: He lives with his daughter and son-in-law-they are supportive and advocate for him to make his own decisions-we need to facilitate further conversation with his daughter present. I doubt he was a highly mobile person given his skin breakdown and lower extremity condition. Recommend WOC.  Desire for further Chaplaincy support:yes Other Recommendations:  Recommend avoiding HD indefinitely or for as long as possible.  Creatinine worse  today  Liver function is improving  No fever or further signs of severe infection Prognosis: < 6 months  Discharge Planning:  To be determine pending progression or resolution of his kidney failure I discussed hospice care in general terms and provided education.       Chief Complaint/History of Present  Illness: 81 yo man with poorly controlled DM, lower extremity DM foot ulcer, sacral decubitus, obesity, HTN, and HLD who presented to ED with Sepsis of unknown etiology-possible LE cellulitis, wound infection with acute renal failure, abnormal LFT and encephalopathy. PMT consulted for goals of care by nephrology due to patient not being a good HD candidate.  Primary Diagnoses  Present on Admission:  . SIRS (systemic inflammatory response syndrome) . CVA (cerebral infarction) . Elevated LFTs . Acute encephalopathy . Acute renal failure . Fungal rash of trunk . Altered mental status . Hypothermia . Essential hypertension . Hyperkalemia . Diabetes type 2, controlled  Palliative Review of Systems: Pain:  no, Dyspnea: no, Cough: no, Nutritional status(weight change, appetite, taste disturbance) yes, Oral Symptoms (xerostomia, dysphagia, odynophagia): no, Nausea/Vomiting: no, Constipation/diarrhea: no, Urination problems: no, Sleep: no, Fatigue: yes, Sedation: no, Cognitive/ memory problems: yes, Anxiety: no, Depression: no, Concerns/worries: no, Other: no  I have reviewed the medical record, interviewed the patient and family, and examined the patient. The following aspects are pertinent.  Past Medical History  Diagnosis Date  . Diabetes mellitus without complication   . Hypertension   . Blind left eye    History   Social History  . Marital Status: Widowed    Spouse Name: N/A  . Number of Children: N/A  . Years of Education: N/A   Social History Main Topics  . Smoking status: Never Smoker   . Smokeless tobacco: Not on file  . Alcohol Use: No  . Drug Use: No  . Sexual Activity: No   Other Topics Concern  . None   Social History Narrative   History reviewed. No pertinent family history. Scheduled Meds: . aspirin EC  81 mg Oral Daily  . brimonidine  1 drop Right Eye BID  . docusate sodium  100 mg Oral BID  . heparin  5,000 Units Subcutaneous 3 times per day  . nystatin    Topical BID  . sodium chloride  3 mL Intravenous Q12H   Continuous Infusions:  PRN Meds:.bisacodyl, ondansetron **OR** ondansetron (ZOFRAN) IV, senna-docusate Medications Prior to Admission:  Prior to Admission medications   Medication Sig Start Date End Date Taking? Authorizing Provider  acetaminophen (TYLENOL) 325 MG tablet Take 2 tablets (650 mg total) by mouth every 6 (six) hours as needed for mild pain or moderate pain (or Fever >/= 101). 04/20/13  Yes Sheila Oats, MD  aspirin EC 81 MG tablet Take 81 mg by mouth daily.   Yes Historical Provider, MD  brimonidine (ALPHAGAN) 0.2 % ophthalmic solution Place 1 drop into the right eye 2 (two) times daily. 03/28/14  Yes Historical Provider, MD  cyanocobalamin (,VITAMIN B-12,) 1000 MCG/ML injection INJECT 1 ML AS DIRECTED EVERY MONTH 04/05/13  Yes Darlyne Russian, MD  furosemide (LASIX) 20 MG tablet TAKE 1 TABLET (20 MG TOTAL) BY MOUTH DAILY. 03/30/14  Yes Darlyne Russian, MD  losartan (COZAAR) 50 MG tablet Take 1 tablet (50 mg total) by mouth daily. 03/30/14  Yes Darlyne Russian, MD   No Known Allergies CBC:    Component Value Date/Time   WBC 6.5 05/05/2014 0512   HGB 9.8* 05/05/2014 1761  HCT 28.3* 05/05/2014 0512   PLT 197 05/05/2014 0512   MCV 77.7* 05/05/2014 0512   NEUTROABS 4.6 02/05/2014 1830   LYMPHSABS 1.2 02/05/2014 1830   MONOABS 0.7 02/05/2014 1830   EOSABS 0.1 02/05/2014 1830   BASOSABS 0.0 02/05/2014 1830   Comprehensive Metabolic Panel:    Component Value Date/Time   NA 136 05/05/2014 0512   K 5.2* 05/05/2014 0512   CL 105 05/05/2014 0512   CO2 18* 05/05/2014 0512   BUN 81* 05/05/2014 0512   CREATININE 7.26* 05/05/2014 0512   CREATININE 1.14 12/28/2013 1159   GLUCOSE 117* 05/05/2014 0512   CALCIUM 8.3* 05/05/2014 0512   AST 177* 05/05/2014 0512   ALT 140* 05/05/2014 0512   ALKPHOS 61 05/05/2014 0512   BILITOT 0.6 05/05/2014 0512   PROT 5.1* 05/05/2014 0512   ALBUMIN 2.1* 05/05/2014 0512    Physical  Exam: Vital Signs: BP 151/69 mmHg  Pulse 78  Temp(Src) 98.5 F (36.9 C) (Oral)  Resp 19  Ht '5\' 6"'  (1.676 m)  Wt 107.5 kg (236 lb 15.9 oz)  BMI 38.27 kg/m2  SpO2 100% SpO2: SpO2: 100 % O2 Device: O2 Device: Not Delivered O2 Flow Rate:   Intake/output summary:  Intake/Output Summary (Last 24 hours) at 05/06/14 0002 Last data filed at 05/05/14 1700  Gross per 24 hour  Intake      0 ml  Output    125 ml  Net   -125 ml   LBM:   Baseline Weight: Weight: 107.8 kg (237 lb 10.5 oz) Most recent weight: Weight: 107.5 kg (236 lb 15.9 oz)  Exam Findings: General: Alert and Oriented  NAD  HEENT: Normal Moist Mucous Membranes, nearly blind, glasses on +strabismus CV: RRR  Tachycardic  Respiratory: Decreased breath sounds  Abdomen: Soft, Non-tender  Bowel Sounds  Extremities: Edema (+) Palpable Pulses (weak), left foot ulcer covered, right leg wound covered, sacral decubitus covered Skin: Decubitus + Neuro/Psych: Flat Affect but Cooperative and Pleasant, Follows Commands          Palliative Performance Scale: 40%            Additional Data Reviewed: Recent Labs     05/03/14  1330  05/04/14  0335  05/05/14  0512  WBC   --   7.7  6.5  HGB   --   9.8*  9.8*  PLT   --   161  197  NA  140   --   136  BUN  68*   --   81*  CREATININE  5.40*   --   7.26*     Time In: 2PM Time Out: 3PM Time Total: 60 minutes  Greater than 50%  of this time was spent counseling and coordinating care related to the above assessment and plan.  Signed by: Roma Schanz, DO  05/06/2014, 12:02 AM  Please contact Palliative Medicine Team phone at [phone] for questions and concerns.

## 2014-05-07 DIAGNOSIS — L89150 Pressure ulcer of sacral region, unstageable: Secondary | ICD-10-CM

## 2014-05-07 LAB — COMPREHENSIVE METABOLIC PANEL
ALT: 114 U/L — ABNORMAL HIGH (ref 0–53)
AST: 102 U/L — AB (ref 0–37)
Albumin: 2 g/dL — ABNORMAL LOW (ref 3.5–5.2)
Alkaline Phosphatase: 60 U/L (ref 39–117)
Anion gap: 16 — ABNORMAL HIGH (ref 5–15)
BUN: 95 mg/dL — ABNORMAL HIGH (ref 6–23)
CALCIUM: 8.2 mg/dL — AB (ref 8.4–10.5)
CO2: 17 mmol/L — ABNORMAL LOW (ref 19–32)
CREATININE: 8.89 mg/dL — AB (ref 0.50–1.35)
Chloride: 106 mmol/L (ref 96–112)
GFR calc non Af Amer: 5 mL/min — ABNORMAL LOW (ref >90)
GFR, EST AFRICAN AMERICAN: 5 mL/min — AB (ref >90)
Glucose, Bld: 129 mg/dL — ABNORMAL HIGH (ref 70–99)
POTASSIUM: 4.9 mmol/L (ref 3.5–5.1)
Sodium: 139 mmol/L (ref 135–145)
Total Bilirubin: 0.6 mg/dL (ref 0.3–1.2)
Total Protein: 5.1 g/dL — ABNORMAL LOW (ref 6.0–8.3)

## 2014-05-07 LAB — RENAL FUNCTION PANEL
Albumin: 2 g/dL — ABNORMAL LOW (ref 3.5–5.2)
Anion gap: 16 — ABNORMAL HIGH (ref 5–15)
BUN: 95 mg/dL — ABNORMAL HIGH (ref 6–23)
CO2: 17 mmol/L — AB (ref 19–32)
Calcium: 8.1 mg/dL — ABNORMAL LOW (ref 8.4–10.5)
Chloride: 105 mmol/L (ref 96–112)
Creatinine, Ser: 8.79 mg/dL — ABNORMAL HIGH (ref 0.50–1.35)
GFR calc non Af Amer: 5 mL/min — ABNORMAL LOW (ref >90)
GFR, EST AFRICAN AMERICAN: 6 mL/min — AB (ref >90)
GLUCOSE: 128 mg/dL — AB (ref 70–99)
POTASSIUM: 4.9 mmol/L (ref 3.5–5.1)
Phosphorus: 9 mg/dL — ABNORMAL HIGH (ref 2.3–4.6)
Sodium: 138 mmol/L (ref 135–145)

## 2014-05-07 LAB — CBC
HCT: 27.4 % — ABNORMAL LOW (ref 39.0–52.0)
Hemoglobin: 9.5 g/dL — ABNORMAL LOW (ref 13.0–17.0)
MCH: 26.6 pg (ref 26.0–34.0)
MCHC: 34.7 g/dL (ref 30.0–36.0)
MCV: 76.8 fL — ABNORMAL LOW (ref 78.0–100.0)
Platelets: 188 10*3/uL (ref 150–400)
RBC: 3.57 MIL/uL — ABNORMAL LOW (ref 4.22–5.81)
RDW: 13.6 % (ref 11.5–15.5)
WBC: 6.7 10*3/uL (ref 4.0–10.5)

## 2014-05-07 LAB — HEMOGLOBIN A1C
Hgb A1c MFr Bld: 6.5 % — ABNORMAL HIGH (ref 4.8–5.6)
Mean Plasma Glucose: 140 mg/dL

## 2014-05-07 LAB — CULTURE, BLOOD (ROUTINE X 2)
Culture: NO GROWTH
Culture: NO GROWTH

## 2014-05-07 LAB — GLUCOSE, CAPILLARY
GLUCOSE-CAPILLARY: 163 mg/dL — AB (ref 70–99)
Glucose-Capillary: 104 mg/dL — ABNORMAL HIGH (ref 70–99)
Glucose-Capillary: 150 mg/dL — ABNORMAL HIGH (ref 70–99)
Glucose-Capillary: 143 mg/dL — ABNORMAL HIGH (ref 70–99)

## 2014-05-07 LAB — MAGNESIUM: Magnesium: 2.1 mg/dL (ref 1.5–2.5)

## 2014-05-07 MED ORDER — SODIUM CHLORIDE 0.9 % IV SOLN
INTRAVENOUS | Status: DC
  Administered 2014-05-07 – 2014-05-08 (×2): via INTRAVENOUS
  Administered 2014-05-08: 50 mL/h via INTRAVENOUS
  Administered 2014-05-10 (×2): via INTRAVENOUS

## 2014-05-07 NOTE — Progress Notes (Signed)
[facility] Progress Note   Subjective: no compliants  Filed Vitals:   05/07/14 0444 05/07/14 0700 05/07/14 0754 05/07/14 1156  BP: 175/85 156/67 156/67 132/90  Pulse: 83 79 78 69  Temp: 97.5 F (36.4 C)  97.7 F (36.5 C) 96.8 F (36 C)  TempSrc: Axillary  Oral Axillary  Resp: 16 16 20 17   Height:      Weight: 109.6 kg (241 lb 10 oz)     SpO2: 100% 99% 99% 97%   Exam: Elderly AAM, no distress, frail, pleasant No jvd Chest clear bilat RRR no MRG Abd obese, NTND, no mass or ascites, no HSM 1+ LE edema bilat Neuro alert, responsive, no asterixis  UA (undersigned) > large Hb, 2+ protein, 3+ WBC (20-50 per hpf) and diffuse bacteria, 5-10 rbc, moderate gran casts Last creat 1.01 in Jan 2016   Assessment: 1. Acute renal failure - ATN due to shock/ ARB.  UOP improving, 250 cc in bag today. No gross signs of uremia or need for HD. Alert, eating. He would want temp HD if needed. Will reassess daily, looks like starting to recover function. Will resume some IVF"s at 50/ hr 2. Shock/ hypotension - resolved; no specific infection found, w/u negative and abx stopped 3. HTN home meds on hold 4. Blind L eye 5. DM 6. Hx falls / debility        Plan - as above    Kelly Splinter MD  pager [phone]    cell [phone]  05/07/2014, 12:14 PM     Recent Labs Lab 05/01/14 0838  05/03/14 1330  05/06/14 0323 05/06/14 1559 05/07/14 0302  NA  --   < > 140  < > 134* 138 139  138  K  --   < > 4.8  < > 5.2* 4.7 4.9  4.9  CL  --   < > 109  < > 104 104 106  105  CO2  --   < > 19  < > 17* 20 17*  17*  GLUCOSE  --   < > 141*  < > 123* 184* 129*  128*  BUN  --   < > 68*  < > 88* 91* 95*  95*  CREATININE  --   < > 5.40*  < > 7.91* 8.19* 8.89*  8.79*  CALCIUM  --   < > 8.0*  < > 8.2* 8.2* 8.2*  8.1*  PHOS 5.3*  --  5.4*  --   --   --  9.0*  < > = values in this interval not displayed.  Recent Labs Lab 05/05/14 0512 05/06/14 0323 05/07/14 0302  AST 177* 122*  102*  ALT 140* 124* 114*  ALKPHOS 61 59 60  BILITOT 0.6 0.6 0.6  PROT 5.1* 5.1* 5.1*  ALBUMIN 2.1* 2.0* 2.0*  2.0*    Recent Labs Lab 05/04/14 0335 05/05/14 0512 05/07/14 0302  WBC 7.7 6.5 6.7  HGB 9.8* 9.8* 9.5*  HCT 28.5* 28.3* 27.4*  MCV 78.7 77.7* 76.8*  PLT 161 197 188   . aspirin EC  81 mg Oral Daily  . brimonidine  1 drop Right Eye BID  . docusate sodium  100 mg Oral BID  . heparin  5,000 Units Subcutaneous 3 times per day  . nystatin   Topical BID  . sodium bicarbonate  1,300 mg Oral BID  . sodium chloride  3 mL Intravenous Q12H  . sodium polystyrene  60 g Oral Daily  bisacodyl, ondansetron **OR** ondansetron (ZOFRAN) IV, senna-docusate, traMADol

## 2014-05-07 NOTE — Progress Notes (Signed)
[place] TEAM 1 - Stepdown/ICU TEAM Progress Note  Curtis Brock [name] DOB: Dec 28, 1925 DOA: 05/01/2014 PCP: Jenny Reichmann, MD  Admit HPI / Brief Narrative: 81 y.o. 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 In ED workup was significant for acute renal failure with a creatinine of 2, hypothermia with temperature of 92.4, urinalysis was negative, no acute finding on chest x-ray, blood cultures were sent, CT head with no acute findings.   Patient's crt continued to increase after admission to [facility], he remained anuric despite being +7000 mL positive, and was therefore transferred to [facility] for possible need of hemodialysis in near future.  HPI/Subjective: 4/23 A/O 3 (did not know when), sitting in chair, follows all commands, answer all the questions appropriately. Negative CP, negative SOB, negative N/V, states has back pain rated at 5/10  Assessment/Plan:  Acute renal failure / anuric. -Nephrology following; making urine, however BUN/creatinine continues to climb. -Strict in and out since admission + 7.4 L -Per nephrology note patient now willing to do temporary HD if required  SIRS -Patient meets SIRS criteria on admission given his hypothermia, tachypnea. -No convincing radiographic or clinical evidence of infection - stopping antibiotics, if patient spikes fever or leukocytosis will panculture and restart antibiotics  Elevated LFTs -ultrasound showing 1 cm gallstone lodged in the gallbladder neck with no evidence of cholecystitis, CT abdomen pelvis without contrast with no acute finding. -No pain in right upper quadrant on physical exam. -LFTs continue trending down  Pressure ulcers/deep tissue injury in sacral area -Wound care following -Out of bed to chair daily -ObtainTriadyne bed, and Ensure on rotation setting at all times  Acute encephalopathy - CT head with  no acute findings - most likely metabolic encephalopathy secondary to acute renal failure -ammonia within normal limits  History of CVA - Resume aspirin when able to take oral consistently  Fungal rash in groin and abdominal fold areas - Treated with Diflucan, continue with nystatin powder  Essential hypertension -At 81 years old BP is appropriate, monitor closely  Pulmonary hypertension -See essential hypertension  Diabetes mellitus Type 2 controlled -Does not take any home medication, CBGs are controlled without insulin. -4/22 Hemoglobin A1c=6.5 -Lipid panel; LDL slightly elevated by 88 guidelines and HDL low will hold on adding additional medication at this time.   Hyperkalemia -Kayexalate 60 gm 2 doses  Metabolic acidosis -Continue sodium bicarbonate 1300 mg BID  Back pain   -Out of bed to chair q shift -Tramadol 50 mg QID PRN  -Unable to treat pain with NSAIDs or Tylenol secondary patient's renal and liver failure  Hypothermic -Bear hugger maintain Temp  37c    Code Status: FULL Family Communication: no family present at time of exam Disposition Plan: SNF    Consultants: Dr.Robert Doctor, hospital (nephrology) Dr. Rhea Pink (palliative care)    Procedure/Significant Events: 4/17 CT head without contrast;-no acute findings-small old lacune infarcts the right base a ganglia and left periventricular white matter.  4/17 ultrasound RUQ;1.0 cm gallstone lodged in the gallbladder neck with no evidence of cholecystitis.  4/18 CT abdomen pelvis without contrast;No acute intra-abdominal or intrapelvic abnormalities. -Minimal RIGHT pleural effusion and basilar atelectasis. -Cholelithiasis with otherwise unremarkable gallbladder.-Sigmoid diverticulosis. -Umbilical hernia containing fat. 4/18 echocardiogram;- Left ventricle: suggest that overall EF is probably normal. -(grade 1diastolic dysfunction). - Pulmonary arteries: PA peak pressure: 39 mm Hg  (S).  Culture   Antibiotics: Zosyn 4/17 >  4/20 Vancomycin 4/17   DVT prophylaxis: Subcutaneous heparin   Devices    LINES / TUBES:      Continuous Infusions: . sodium chloride 50 mL/hr at 05/07/14 1358    Objective: VITAL SIGNS: Temp: 96.8 F (36 C) (04/23 1156) Temp Source: Axillary (04/23 1156) BP: 132/90 mmHg (04/23 1156) Pulse Rate: 69 (04/23 1156) SPO2; FIO2:   Intake/Output Summary (Last 24 hours) at 05/07/14 1435 Last data filed at 05/07/14 1015  Gross per 24 hour  Intake      0 ml  Output    350 ml  Net   -350 ml     Exam: General: A/O 3 (did not know when), No acute respiratory distress, follows all commands Lungs: Clear to auscultation bilaterally without wheezes or crackles Cardiovascular: Regular rate and rhythm without murmur gallop or rub normal S1 and S2 Abdomen: Nontender, nondistended, soft, bowel sounds positive, no rebound, no ascites, no appreciable mass Extremities: No significant cyanosis, clubbing. Bilateral +1 pitting edema to knees.     Data Reviewed: Basic Metabolic Panel:  Recent Labs Lab 05/01/14 0838  05/03/14 1330 05/05/14 0512 05/06/14 0323 05/06/14 1559 05/07/14 0302  NA  --   < > 140 136 134* 138 139  138  K  --   < > 4.8 5.2* 5.2* 4.7 4.9  4.9  CL  --   < > 109 105 104 104 106  105  CO2  --   < > 19 18* 17* 20 17*  17*  GLUCOSE  --   < > 141* 117* 123* 184* 129*  128*  BUN  --   < > 68* 81* 88* 91* 95*  95*  CREATININE  --   < > 5.40* 7.26* 7.91* 8.19* 8.89*  8.79*  CALCIUM  --   < > 8.0* 8.3* 8.2* 8.2* 8.2*  8.1*  MG 2.3  --   --   --  2.1  --  2.1  PHOS 5.3*  --  5.4*  --   --   --  9.0*  < > = values in this interval not displayed. Liver Function Tests:  Recent Labs Lab 05/02/14 0405 05/03/14 0330 05/03/14 1330 05/05/14 0512 05/06/14 0323 05/07/14 0302  AST 620* 414*  --  177* 122* 102*  ALT 208* 183*  --  140* 124* 114*  ALKPHOS 67 57  --  61 59 60  BILITOT 0.6 0.5  --  0.6 0.6 0.6   PROT 5.4* 5.2*  --  5.1* 5.1* 5.1*  ALBUMIN 2.5* 2.4* 2.3* 2.1* 2.0* 2.0*  2.0*   No results for input(s): LIPASE, AMYLASE in the last 168 hours.  Recent Labs Lab 05/01/14 0838 05/06/14 0323  AMMONIA 25 17   CBC:  Recent Labs Lab 05/02/14 0405 05/03/14 0330 05/04/14 0335 05/05/14 0512 05/07/14 0302  WBC 9.6 7.8 7.7 6.5 6.7  HGB 11.5* 10.3* 9.8* 9.8* 9.5*  HCT 35.6* 31.4* 28.5* 28.3* 27.4*  MCV 81.1 81.3 78.7 77.7* 76.8*  PLT 182 187 161 197 188   Cardiac Enzymes:  Recent Labs Lab 05/01/14 0854 05/01/14 1207 05/01/14 1754 05/01/14 2255  TROPONINI <0.03 <0.03 0.04* 0.04*   BNP (last 3 results) No results for input(s): BNP in the last 8760 hours.  ProBNP (last 3 results) No results for input(s): PROBNP in the last 8760 hours.  CBG:  Recent Labs Lab 05/06/14 1212 05/06/14 1626 05/06/14 2152 05/07/14 0821 05/07/14 1258  GLUCAP 162* 172* 128* 104* 150*    Recent  Results (from the past 240 hour(s))  Culture, blood (routine x 2)     Status: None   Collection Time: 05/01/14  7:56 AM  Result Value Ref Range Status   Specimen Description BLOOD LEFT HAND  Final   Special Requests BOTTLES DRAWN AEROBIC ONLY 4 CC  Final   Culture   Final    NO GROWTH 5 DAYS Performed at [facility]    Report Status 05/07/2014 FINAL  Final  Culture, blood (routine x 2)     Status: None   Collection Time: 05/01/14  7:56 AM  Result Value Ref Range Status   Specimen Description BLOOD RIGHT HAND  Final   Special Requests BOTTLES DRAWN AEROBIC AND ANAEROBIC 3 CC EACH  Final   Culture   Final    NO GROWTH 5 DAYS Performed at [facility]    Report Status 05/07/2014 FINAL  Final  MRSA PCR Screening     Status: None   Collection Time: 05/01/14 11:03 AM  Result Value Ref Range Status   MRSA by PCR NEGATIVE NEGATIVE Final    Comment:        The GeneXpert MRSA Assay (FDA approved for NASAL specimens only), is one component of a comprehensive MRSA  colonization surveillance program. It is not intended to diagnose MRSA infection nor to guide or monitor treatment for MRSA infections.   Culture, Urine     Status: None   Collection Time: 05/03/14  5:19 AM  Result Value Ref Range Status   Specimen Description URINE, CATHETERIZED  Final   Special Requests NONE  Final   Colony Count NO GROWTH Performed at [facility]   Final   Culture NO GROWTH Performed at [facility]   Final   Report Status 05/04/2014 FINAL  Final     Studies:  Recent x-ray studies have been reviewed in detail by the Attending Physician  Scheduled Meds:  Scheduled Meds: . aspirin EC  81 mg Oral Daily  . brimonidine  1 drop Right Eye BID  . docusate sodium  100 mg Oral BID  . heparin  5,000 Units Subcutaneous 3 times per day  . nystatin   Topical BID  . sodium bicarbonate  1,300 mg Oral BID  . sodium chloride  3 mL Intravenous Q12H  . sodium polystyrene  60 g Oral Daily    Time spent on care of this patient: 40 mins   Danford Tat, Geraldo Docker , MD  [facility] Office  [phone] Pager - [phone]  On-Call/Text Page:      [email or web address]      password [email or web address]  If 7PM-7AM, please contact night-coverage [email or web address] Password [email or web address] 05/07/2014, 2:35 PM   LOS: 6 days   Care during the described time interval was provided by me .  I have reviewed this patient's available data, including medical history, events of note, physical examination, radiology studies and test results as part of my evaluation  Dia Crawford, MD [phone] Pager

## 2014-05-08 DIAGNOSIS — N179 Acute kidney failure, unspecified: Secondary | ICD-10-CM | POA: Diagnosis present

## 2014-05-08 LAB — COMPREHENSIVE METABOLIC PANEL
ALT: 103 U/L — ABNORMAL HIGH (ref 0–53)
ANION GAP: 13 (ref 5–15)
AST: 73 U/L — AB (ref 0–37)
Albumin: 1.9 g/dL — ABNORMAL LOW (ref 3.5–5.2)
Alkaline Phosphatase: 59 U/L (ref 39–117)
BILIRUBIN TOTAL: 0.5 mg/dL (ref 0.3–1.2)
BUN: 103 mg/dL — ABNORMAL HIGH (ref 6–23)
CHLORIDE: 103 mmol/L (ref 96–112)
CO2: 19 mmol/L (ref 19–32)
CREATININE: 9.14 mg/dL — AB (ref 0.50–1.35)
Calcium: 7.8 mg/dL — ABNORMAL LOW (ref 8.4–10.5)
GFR calc Af Amer: 5 mL/min — ABNORMAL LOW (ref >90)
GFR calc non Af Amer: 5 mL/min — ABNORMAL LOW (ref >90)
GLUCOSE: 168 mg/dL — AB (ref 70–99)
POTASSIUM: 5.2 mmol/L — AB (ref 3.5–5.1)
SODIUM: 135 mmol/L (ref 135–145)
TOTAL PROTEIN: 4.8 g/dL — AB (ref 6.0–8.3)

## 2014-05-08 LAB — GLUCOSE, CAPILLARY
GLUCOSE-CAPILLARY: 129 mg/dL — AB (ref 70–99)
Glucose-Capillary: 119 mg/dL — ABNORMAL HIGH (ref 70–99)
Glucose-Capillary: 127 mg/dL — ABNORMAL HIGH (ref 70–99)
Glucose-Capillary: 122 mg/dL — ABNORMAL HIGH (ref 70–99)

## 2014-05-08 LAB — MAGNESIUM: Magnesium: 2.1 mg/dL (ref 1.5–2.5)

## 2014-05-08 LAB — HEPATITIS B SURFACE ANTIBODY,QUALITATIVE: Hep B S Ab: NEGATIVE

## 2014-05-08 LAB — HEPATITIS B SURFACE ANTIGEN: Hepatitis B Surface Ag: NEGATIVE

## 2014-05-08 MED ORDER — LIDOCAINE HCL (PF) 1 % IJ SOLN: 5.0000 mL | Freq: Once | INTRAMUSCULAR | Status: DC

## 2014-05-08 MED ORDER — LIDOCAINE-PRILOCAINE 2.5-2.5 % EX CREA: 1.0000 "application " | TOPICAL_CREAM | CUTANEOUS | Status: DC | PRN

## 2014-05-08 MED ORDER — PENTAFLUOROPROP-TETRAFLUOROETH EX AERO: 1.0000 "application " | INHALATION_SPRAY | CUTANEOUS | Status: DC | PRN

## 2014-05-08 MED ORDER — NEPRO/CARBSTEADY PO LIQD
237.0000 mL | Freq: Two times a day (BID) | ORAL | Status: DC
  Administered 2014-05-08 – 2014-05-24 (×16): 237 mL via ORAL

## 2014-05-08 MED ORDER — HEPARIN SODIUM (PORCINE) 1000 UNIT/ML DIALYSIS: 2000.0000 [IU] | INTRAMUSCULAR | Status: DC | PRN

## 2014-05-08 MED ORDER — LIDOCAINE HCL (PF) 1 % IJ SOLN: 5.0000 mL | INTRAMUSCULAR | Status: DC | PRN

## 2014-05-08 MED ORDER — SODIUM CHLORIDE 0.9 % IV SOLN: 100.0000 mL | INTRAVENOUS | Status: DC | PRN

## 2014-05-08 MED ORDER — ALTEPLASE 2 MG IJ SOLR: 2.0000 mg | Freq: Once | INTRAMUSCULAR | Status: AC | PRN

## 2014-05-08 MED ORDER — SODIUM POLYSTYRENE SULFONATE 15 GM/60ML PO SUSP
60.0000 g | Freq: Two times a day (BID) | ORAL | Status: DC
  Administered 2014-05-08: 60 g via ORAL
  Filled 2014-05-08 (×2): qty 240

## 2014-05-08 MED ORDER — HEPARIN SODIUM (PORCINE) 1000 UNIT/ML DIALYSIS: 1000.0000 [IU] | INTRAMUSCULAR | Status: DC | PRN

## 2014-05-08 MED ORDER — NEPRO/CARBSTEADY PO LIQD: 237.0000 mL | ORAL | Status: DC | PRN

## 2014-05-08 MED ORDER — LIDOCAINE HCL (PF) 1 % IJ SOLN: 30.0000 mL | Freq: Once | INTRAMUSCULAR | Status: DC

## 2014-05-08 MED ORDER — LIDOCAINE HCL (PF) 1 % IJ SOLN
INTRAMUSCULAR | Status: AC
  Filled 2014-05-08: qty 5

## 2014-05-08 NOTE — Procedures (Signed)
Under sterile conditions and using US guidance a 20 cm 3-lumen temp Trialysis hemodialysis catheter was placed without difficulty.  Indication is uremia and AKI.  No complications, ready to use.   Kelly Splinter MD (pgr) [phone]    ([phone] 05/08/2014, 5:57 PM

## 2014-05-08 NOTE — Progress Notes (Signed)
Report called and given by Lesly Rubenstein, HD RN.

## 2014-05-08 NOTE — Progress Notes (Signed)
05/08/2014 7:19 PM  Pt arrived to unit via bed to room 6E19.  Pt lethargic but alert and oriented when fully aroused.  Denies pain.  Placed on tele box #19, confirmed with CCMD.  Skin assessment reveals DTI to gluteal cleft that is now open.  Pt is incontinent of stool at this time so unable to place foam dressing.  Area cleansed and barrier cream applied; pt is already on a specialty bed to prevent further breakdown.  Heels red but blancheable; fungal irritation noted to abdominal folds.  Foley in place.  HD at bedside now to take patient for treatment.  Pt oriented to room/unit, and was instructed on how to utilize the call bell, to which he verbalized understanding.  Princella Pellegrini

## 2014-05-08 NOTE — Progress Notes (Signed)
Pt arrived to unit via specialty bed to room 6E19 from HD accompanied by HD RN Bo. Pt is lethargic but alert and oriented X4 when RN attempted to wake him up. Denies having any pain as of this time. VSS, Temp=97.7 and not in any respiratory distress. Foley catheter in place attached to standard urine bag.With DTI to gluteal cleft that is now open per report from day RN Allyson.Pt oriented to room, bed alarm on, was instructed on how to use the call bell, pt verbalized understanding. Will continue to monitor.

## 2014-05-08 NOTE — Progress Notes (Signed)
05/08/2014 Patient daughter did watch educational video on Hemodialysis today. [facility] RN.

## 2014-05-08 NOTE — Progress Notes (Signed)
  [facility] Progress Note   Subjective: vomited yest x 1, first time. No c/o's, appetite worse  Filed Vitals:   05/08/14 0400 05/08/14 0424 05/08/14 0700 05/08/14 0839  BP: 131/60 131/60 133/63 127/67  Pulse: 82 80 84 86  Temp:  97.6 F (36.4 C)  98.1 F (36.7 C)  TempSrc:  Oral  Axillary  Resp: 17 14 17 15   Height:      Weight:      SpO2: 96% 98% 97% 100%   Exam: Elderly AAM, no distress, frail, pleasant No jvd Chest clear bilat RRR no MRG Abd obese, NTND, no mass or ascites, no HSM 1+ LE edema bilat Neuro alert, responsive, no asterixis, weak  UA (undersigned) > large Hb, 2+ protein, 3+ WBC (20-50 per hpf) and diffuse bacteria, 5-10 rbc, moderate gran casts Urine cx negative 4/19, BCx neg x 2 4/17 Last creat 1.01 in Jan 2016   Assessment: 1. AKI shock/ ARB [place], needs HD 2. Shock/ low BP resolved, w/u neg abx stopped 3. HTN home meds on hold 4. Blind L eye 5. DM 6. Hx falls / debility        Plan - HD today, no fluid off, cont IVF at [place] MD  pager [phone]    cell [phone]  05/08/2014, 12:28 PM     Recent Labs Lab 05/03/14 1330  05/06/14 1559 05/07/14 0302 05/08/14 0321  NA 140  < > 138 139  138 135  K 4.8  < > 4.7 4.9  4.9 5.2*  CL 109  < > 104 106  105 103  CO2 19  < > 20 17*  17* 19  GLUCOSE 141*  < > 184* 129*  128* 168*  BUN 68*  < > 91* 95*  95* 103*  CREATININE 5.40*  < > 8.19* 8.89*  8.79* 9.14*  CALCIUM 8.0*  < > 8.2* 8.2*  8.1* 7.8*  PHOS 5.4*  --   --  9.0*  --   < > = values in this interval not displayed.  Recent Labs Lab 05/06/14 0323 05/07/14 0302 05/08/14 0321  AST 122* 102* 73*  ALT 124* 114* 103*  ALKPHOS 59 60 59  BILITOT 0.6 0.6 0.5  PROT 5.1* 5.1* 4.8*  ALBUMIN 2.0* 2.0*  2.0* 1.9*    Recent Labs Lab 05/04/14 0335 05/05/14 0512 05/07/14 0302  WBC 7.7 6.5 6.7  HGB 9.8* 9.8* 9.5*  HCT 28.5* 28.3* 27.4*  MCV 78.7 77.7* 76.8*  PLT 161 197 188   . aspirin EC  81 mg  Oral Daily  . brimonidine  1 drop Right Eye BID  . docusate sodium  100 mg Oral BID  . feeding supplement (NEPRO CARB STEADY)  237 mL Oral BID  . heparin  5,000 Units Subcutaneous 3 times per day  . nystatin   Topical BID  . sodium bicarbonate  1,300 mg Oral BID  . sodium chloride  3 mL Intravenous Q12H   . sodium chloride 50 mL/hr at 05/08/14 0940   bisacodyl, ondansetron **OR** ondansetron (ZOFRAN) IV, senna-docusate, traMADol

## 2014-05-08 NOTE — Progress Notes (Signed)
[place] TEAM 1 - Stepdown/ICU TEAM Progress Note  Curtis Brock [name] DOB: August 12, 1925 DOA: 05/01/2014 PCP: Jenny Reichmann, MD  Admit HPI / Brief Narrative: 81 y.o. 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 In ED workup was significant for acute renal failure with a creatinine of 2, hypothermia with temperature of 92.4, urinalysis was negative, no acute finding on chest x-ray, blood cultures were sent, CT head with no acute findings.   Patient's crt continued to increase after admission to [facility], he remained anuric despite being +7000 mL positive, and was therefore transferred to [facility] for possible need of hemodialysis in near future.  HPI/Subjective: 4/24 lethargic, arousable, A/O 4, laying comfortably in bed, follows all commands, answer all the questions appropriately. Negative CP, negative SOB, negative N/V. Confirms that he would be willing to do temporary HD if required,   Assessment/Plan:  Acute renal failure / anuric. -Nephrology following; making urine, however BUN/creatinine continues to climb, 4/24 now 103/9.14. -Strict in and out since admission + 7.4 L -Per nephrology note patient now willing to do temporary HD if required - SIRS -Patient meets SIRS criteria on admission given his hypothermia, tachypnea. -No convincing radiographic or clinical evidence of infection - stopping antibiotics, if patient spikes fever or leukocytosis will panculture and restart antibiotics  Elevated LFTs -ultrasound showing 1 cm gallstone lodged in the gallbladder neck with no evidence of cholecystitis, CT abdomen pelvis without contrast with no acute finding. -No abdominal pain.  -LFTs continuing to trend down  Pressure ulcers/sacral decubitus ulcer -Wound care following -Out of bed to chair daily - EnsureTriadyne bed on rotation setting at all times  Acute encephalopathy -  Patient with continued lethargy most likely secondary to increasing uremia however once aroused is a/O 4.   History of CVA - Continue aspirin 81 mg daily   Fungal rash in groin and abdominal fold areas - Treated with Diflucan, continue with nystatin powder  Essential hypertension -At 81 years old BP is appropriate, monitor closely  Pulmonary hypertension -See essential hypertension  Diabetes mellitus Type 2 controlled -Does not take any home medication, CBGs are controlled without insulin. -4/22 Hemoglobin A1c=6.5 -Lipid panel; LDL slightly elevated by 88 guidelines and HDL low will hold on adding additional medication at this time.   Hyperkalemia -Kayexalate 60 gm  2 doses  Metabolic acidosis -Continue sodium bicarbonate 1300 mg BID  Back pain   -Out of bed to chair q shift -Tramadol 50 mg QID PRN  -Unable to treat pain with NSAIDs or Tylenol secondary patient's renal and liver failure  Hypothermic -Bear hugger maintain Temp  37c    Code Status: FULL Family Communication: no family present at time of exam Disposition Plan: SNF    Consultants: Dr.Robert Doctor, hospital (nephrology) Dr. Rhea Pink (palliative care)    Procedure/Significant Events: 4/17 CT head without contrast;-no acute findings-small old lacune infarcts the right base a ganglia and left periventricular white matter.  4/17 ultrasound RUQ;1.0 cm gallstone lodged in the gallbladder neck with no evidence of cholecystitis.  4/18 CT abdomen pelvis without contrast;No acute intra-abdominal or intrapelvic abnormalities. -Minimal RIGHT pleural effusion and basilar atelectasis. -Cholelithiasis with otherwise unremarkable gallbladder.-Sigmoid diverticulosis. -Umbilical hernia containing fat. 4/18 echocardiogram;- Left ventricle: suggest that overall EF is probably normal. -(grade 1diastolic dysfunction). - Pulmonary arteries: PA peak pressure: 39 mm Hg (S).  Culture   Antibiotics: Zosyn 4/17 >  >stopped 4/20 Vancomycin>> stopped4/17  DVT prophylaxis: Subcutaneous heparin   Devices    LINES / TUBES:      Continuous Infusions: . sodium chloride 50 mL/hr at 05/08/14 0940    Objective: VITAL SIGNS: Temp: 98.1 F (36.7 C) (04/24 0839) Temp Source: Axillary (04/24 0839) BP: 127/67 mmHg (04/24 0839) Pulse Rate: 86 (04/24 0839) SPO2; FIO2:   Intake/Output Summary (Last 24 hours) at 05/08/14 0951 Last data filed at 05/08/14 0700  Gross per 24 hour  Intake 1211.67 ml  Output    700 ml  Net 511.67 ml     Exam: General: lethargic, arousable, A/O 4,, No acute respiratory distress, follows all commands Lungs: Clear to auscultation bilaterally without wheezes or crackles Cardiovascular: Regular rate and rhythm without murmur gallop or rub normal S1 and S2 Abdomen: Nontender, nondistended, soft, bowel sounds positive, no rebound, no ascites, no appreciable mass Extremities: No significant cyanosis, clubbing. Bilateral +1 pitting edema to knees.     Data Reviewed: Basic Metabolic Panel:  Recent Labs Lab 05/03/14 1330 05/05/14 0512 05/06/14 0323 05/06/14 1559 05/07/14 0302 05/08/14 0321  NA 140 136 134* 138 139  138 135  K 4.8 5.2* 5.2* 4.7 4.9  4.9 5.2*  CL 109 105 104 104 106  105 103  CO2 19 18* 17* 20 17*  17* 19  GLUCOSE 141* 117* 123* 184* 129*  128* 168*  BUN 68* 81* 88* 91* 95*  95* 103*  CREATININE 5.40* 7.26* 7.91* 8.19* 8.89*  8.79* 9.14*  CALCIUM 8.0* 8.3* 8.2* 8.2* 8.2*  8.1* 7.8*  MG  --   --  2.1  --  2.1 2.1  PHOS 5.4*  --   --   --  9.0*  --    Liver Function Tests:  Recent Labs Lab 05/03/14 0330 05/03/14 1330 05/05/14 0512 05/06/14 0323 05/07/14 0302 05/08/14 0321  AST 414*  --  177* 122* 102* 73*  ALT 183*  --  140* 124* 114* 103*  ALKPHOS 57  --  61 59 60 59  BILITOT 0.5  --  0.6 0.6 0.6 0.5  PROT 5.2*  --  5.1* 5.1* 5.1* 4.8*  ALBUMIN 2.4* 2.3* 2.1* 2.0* 2.0*  2.0* 1.9*   No results for input(s): LIPASE,  AMYLASE in the last 168 hours.  Recent Labs Lab 05/06/14 0323  AMMONIA 17   CBC:  Recent Labs Lab 05/02/14 0405 05/03/14 0330 05/04/14 0335 05/05/14 0512 05/07/14 0302  WBC 9.6 7.8 7.7 6.5 6.7  HGB 11.5* 10.3* 9.8* 9.8* 9.5*  HCT 35.6* 31.4* 28.5* 28.3* 27.4*  MCV 81.1 81.3 78.7 77.7* 76.8*  PLT 182 187 161 197 188   Cardiac Enzymes:  Recent Labs Lab 05/01/14 1207 05/01/14 1754 05/01/14 2255  TROPONINI <0.03 0.04* 0.04*   BNP (last 3 results) No results for input(s): BNP in the last 8760 hours.  ProBNP (last 3 results) No results for input(s): PROBNP in the last 8760 hours.  CBG:  Recent Labs Lab 05/07/14 0821 05/07/14 1258 05/07/14 1609 05/07/14 2131 05/08/14 0838  GLUCAP 104* 150* 163* 143* 119*    Recent Results (from the past 240 hour(s))  Culture, blood (routine x 2)     Status: None   Collection Time: 05/01/14  7:56 AM  Result Value Ref Range Status   Specimen Description BLOOD LEFT HAND  Final   Special Requests BOTTLES DRAWN AEROBIC ONLY 4 CC  Final   Culture   Final    NO GROWTH 5 DAYS Performed at [facility]  Report Status 05/07/2014 FINAL  Final  Culture, blood (routine x 2)     Status: None   Collection Time: 05/01/14  7:56 AM  Result Value Ref Range Status   Specimen Description BLOOD RIGHT HAND  Final   Special Requests BOTTLES DRAWN AEROBIC AND ANAEROBIC 3 CC EACH  Final   Culture   Final    NO GROWTH 5 DAYS Performed at [facility]    Report Status 05/07/2014 FINAL  Final  MRSA PCR Screening     Status: None   Collection Time: 05/01/14 11:03 AM  Result Value Ref Range Status   MRSA by PCR NEGATIVE NEGATIVE Final    Comment:        The GeneXpert MRSA Assay (FDA approved for NASAL specimens only), is one component of a comprehensive MRSA colonization surveillance program. It is not intended to diagnose MRSA infection nor to guide or monitor treatment for MRSA infections.   Culture, Urine      Status: None   Collection Time: 05/03/14  5:19 AM  Result Value Ref Range Status   Specimen Description URINE, CATHETERIZED  Final   Special Requests NONE  Final   Colony Count NO GROWTH Performed at [facility]   Final   Culture NO GROWTH Performed at [facility]   Final   Report Status 05/04/2014 FINAL  Final     Studies:  Recent x-ray studies have been reviewed in detail by the Attending Physician  Scheduled Meds:  Scheduled Meds: . aspirin EC  81 mg Oral Daily  . brimonidine  1 drop Right Eye BID  . docusate sodium  100 mg Oral BID  . feeding supplement (NEPRO CARB STEADY)  237 mL Oral BID  . heparin  5,000 Units Subcutaneous 3 times per day  . nystatin   Topical BID  . sodium bicarbonate  1,300 mg Oral BID  . sodium chloride  3 mL Intravenous Q12H  . sodium polystyrene  60 g Oral Daily  . sodium polystyrene  60 g Oral BID    Time spent on care of this patient: 40 mins   WOODS, Geraldo Docker , MD  [facility] Office  [phone] Pager [phone]  On-Call/Text Page:      [email or web address]      password [email or web address]  If 7PM-7AM, please contact night-coverage [email or web address] Password [facility] 05/08/2014, 9:51 AM   LOS: 7 days   Care during the described time interval was provided by me .  I have reviewed this patient's available data, including medical history, events of note, physical examination, radiology studies and test results as part of my evaluation  Dia Crawford, MD [phone] Pager

## 2014-05-09 DIAGNOSIS — R4182 Altered mental status, unspecified: Secondary | ICD-10-CM

## 2014-05-09 LAB — CBC WITH DIFFERENTIAL/PLATELET
Basophils Absolute: 0 10*3/uL (ref 0.0–0.1)
Basophils Relative: 0 % (ref 0–1)
EOS PCT: 4 % (ref 0–5)
Eosinophils Absolute: 0.3 10*3/uL (ref 0.0–0.7)
HCT: 24.9 % — ABNORMAL LOW (ref 39.0–52.0)
Hemoglobin: 8.7 g/dL — ABNORMAL LOW (ref 13.0–17.0)
Lymphocytes Relative: 19 % (ref 12–46)
Lymphs Abs: 1.3 10*3/uL (ref 0.7–4.0)
MCH: 27.2 pg (ref 26.0–34.0)
MCHC: 34.9 g/dL (ref 30.0–36.0)
MCV: 77.8 fL — ABNORMAL LOW (ref 78.0–100.0)
MONO ABS: 1.1 10*3/uL — AB (ref 0.1–1.0)
Monocytes Relative: 16 % — ABNORMAL HIGH (ref 3–12)
NEUTROS ABS: 4.2 10*3/uL (ref 1.7–7.7)
Neutrophils Relative %: 61 % (ref 43–77)
Platelets: 208 10*3/uL (ref 150–400)
RBC: 3.2 MIL/uL — AB (ref 4.22–5.81)
RDW: 14 % (ref 11.5–15.5)
WBC: 6.9 10*3/uL (ref 4.0–10.5)

## 2014-05-09 LAB — GLUCOSE, CAPILLARY
GLUCOSE-CAPILLARY: 136 mg/dL — AB (ref 70–99)
Glucose-Capillary: 109 mg/dL — ABNORMAL HIGH (ref 70–99)
Glucose-Capillary: 129 mg/dL — ABNORMAL HIGH (ref 70–99)
Glucose-Capillary: 122 mg/dL — ABNORMAL HIGH (ref 70–99)

## 2014-05-09 LAB — COMPREHENSIVE METABOLIC PANEL
ALT: 103 U/L — ABNORMAL HIGH (ref 0–53)
ANION GAP: 15 (ref 5–15)
AST: 73 U/L — AB (ref 0–37)
Albumin: 2 g/dL — ABNORMAL LOW (ref 3.5–5.2)
Alkaline Phosphatase: 60 U/L (ref 39–117)
BUN: 70 mg/dL — AB (ref 6–23)
CALCIUM: 7.6 mg/dL — AB (ref 8.4–10.5)
CHLORIDE: 102 mmol/L (ref 96–112)
CO2: 22 mmol/L (ref 19–32)
CREATININE: 7.53 mg/dL — AB (ref 0.50–1.35)
GFR calc non Af Amer: 6 mL/min — ABNORMAL LOW (ref >90)
GFR, EST AFRICAN AMERICAN: 7 mL/min — AB (ref >90)
GLUCOSE: 129 mg/dL — AB (ref 70–99)
POTASSIUM: 4.2 mmol/L (ref 3.5–5.1)
Sodium: 139 mmol/L (ref 135–145)
Total Bilirubin: 0.6 mg/dL (ref 0.3–1.2)
Total Protein: 4.8 g/dL — ABNORMAL LOW (ref 6.0–8.3)

## 2014-05-09 LAB — MAGNESIUM: MAGNESIUM: 2 mg/dL (ref 1.5–2.5)

## 2014-05-09 LAB — LACTIC ACID, PLASMA: LACTIC ACID, VENOUS: 1.4 mmol/L (ref 0.5–2.0)

## 2014-05-09 NOTE — Consult Note (Signed)
WOC wound consult note Reason for Consult: sacral/buttocks Wound type: at the time of my assessment I did not appreciate any open ulcers over the sacrum or buttocks. He has quite a deep gluteal cleft but no open areas.  I have removed sacral foam dressing since the patient is having frequent stools.  Pressure Ulcer POA: No  Continue to manage moisture with LALM and ok to use barrier cream if continues to have frequent stools.   Discussed POC with patient and bedside nurse.  Re consult if needed, will not follow at this time. Thanks  Emony Dormer [place], [place] [phone])

## 2014-05-09 NOTE — Clinical Documentation Improvement (Signed)
Presents with ARF with ATN secondary to shock. It appears that Sepsis has been ruled out. Creatinine remains elevated. Patient refusing HD.  Please further clarify the type of shock the patient has and document findings in next progress note and include in discharge if applicable.  Septic Shock Cardiogenic Shock Hypovolemic Shock Other Condition Cannot Clinically determine  Thank You, Zoila Shutter ,RN Clinical Documentation Specialist:  [facility]

## 2014-05-09 NOTE — Progress Notes (Signed)
  [facility] Progress Note   Subjective: HD yesterday,  No UF, no issues Filed Vitals:   05/08/14 2200 05/08/14 2230 05/09/14 0500 05/09/14 0927  BP: 112/52 127/61 124/64 140/57  Pulse: 96 96 84 87  Temp: 98 F (36.7 C) 97.7 F (36.5 C) 98.3 F (36.8 C) 98.7 F (37.1 C)  TempSrc: Oral Oral Oral Oral  Resp: 16 16 16 17   Height:      Weight:  112.5 kg (248 lb 0.3 oz) 112.5 kg (248 lb 0.3 oz)   SpO2: 96% 96% 99% 99%   Exam: Elderly AAM, no distress, frail, pleasant Chest clear bilat RRR no MRG Abd obese, NTND, no mass or ascites, no HSM 1+ LE edema bilat Neuro alert, responsive, no asterixis, weak  Last creat 1.01 in Jan 2016   Assessment: 1. AKI shock/ ARB  1. HD #1 4/24 2. Cont to address pallative measures 3. No HD today, daily assessment based on UOP and labs 2. Shock/ low BP resolved, w/u neg abx stopped 3. HTN home meds on hold 4. Blind L eye 5. DM 6. Hx falls / debility        Plan - Cont to follow on daily basis; tentative no HD tomrorow   Pearson Grippe, MD  05/09/2014, 1:45 PM     Recent Labs Lab 05/03/14 1330  05/07/14 0302 05/08/14 0321 05/09/14 0606  NA 140  < > 139  138 135 139  K 4.8  < > 4.9  4.9 5.2* 4.2  CL 109  < > 106  105 103 102  CO2 19  < > 17*  17* 19 22  GLUCOSE 141*  < > 129*  128* 168* 129*  BUN 68*  < > 95*  95* 103* 70*  CREATININE 5.40*  < > 8.89*  8.79* 9.14* 7.53*  CALCIUM 8.0*  < > 8.2*  8.1* 7.8* 7.6*  PHOS 5.4*  --  9.0*  --   --   < > = values in this interval not displayed.  Recent Labs Lab 05/07/14 0302 05/08/14 0321 05/09/14 0606  AST 102* 73* 73*  ALT 114* 103* 103*  ALKPHOS 60 59 60  BILITOT 0.6 0.5 0.6  PROT 5.1* 4.8* 4.8*  ALBUMIN 2.0*  2.0* 1.9* 2.0*    Recent Labs Lab 05/05/14 0512 05/07/14 0302 05/09/14 0606  WBC 6.5 6.7 6.9  NEUTROABS  --   --  4.2  HGB 9.8* 9.5* 8.7*  HCT 28.3* 27.4* 24.9*  MCV 77.7* 76.8* 77.8*  PLT 197 188 208   . aspirin EC  81 mg Oral  Daily  . brimonidine  1 drop Right Eye BID  . docusate sodium  100 mg Oral BID  . feeding supplement (NEPRO CARB STEADY)  237 mL Oral BID  . heparin  5,000 Units Subcutaneous 3 times per day  . lidocaine (PF)  5 mL Other Once  . nystatin   Topical BID  . sodium bicarbonate  1,300 mg Oral BID  . sodium chloride  3 mL Intravenous Q12H   . sodium chloride 50 mL/hr (05/08/14 2222)   sodium chloride, sodium chloride, bisacodyl, feeding supplement (NEPRO CARB STEADY), heparin, heparin, lidocaine (PF), lidocaine-prilocaine, ondansetron **OR** ondansetron (ZOFRAN) IV, pentafluoroprop-tetrafluoroeth, senna-docusate, traMADol

## 2014-05-09 NOTE — Progress Notes (Signed)
Progress Note  Curtis Brock [name] DOB: 06-27-1925 DOA: 05/01/2014 PCP: Jenny Reichmann, MD  Admit HPI / Brief Narrative: 81 y.o. 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 In ED workup was significant for acute renal failure with a creatinine of 2, hypothermia with temperature of 92.4, urinalysis was negative, no acute finding on chest x-ray, blood cultures were sent, CT head with no acute findings.   Patient's crt continued to increase after admission to [facility], he remained anuric despite being +7000 mL positive, and was therefore transferred to [facility] for possible need of hemodialysis in near future.  HPI/Subjective: Afebrile, no CP or SOB. Patient dramatic improvement in mentation and level of alertness after first HD tx.   Assessment/Plan:  Acute renal failure / anuric. -Nephrology following; making urine, however  -patient initiated on temporary HD treatment -will follow clinical response and need for further HD -dramatic improvement with first treatment  SIRS -Patient meets SIRS criteria on admission given his hypothermia, tachypnea; but not findings supporting sepsis -features now resolved -No convincing radiographic or clinical evidence of infection; will continue monitoring off antibiotics for now -if patient spikes fever or leukocytosis will panculture and restart antibiotics  Elevated LFTs -ultrasound showing 1 cm gallstone lodged in the gallbladder neck with no evidence of cholecystitis, CT abdomen pelvis without contrast with no acute finding. -No abdominal pain.  -LFTs continuing to trend down -will monitor for now  Pressure ulcers/sacral decubitus ulcer -Wound care following -continue following preventive masures  Acute encephalopathy - Patient with significant improvement in mentation after HD treatment -most lekly associated with uremia -will follow  renal recommendation regarding further HD  History of CVA - Continue aspirin 81 mg daily  -no new deficit  Fungal rash in groin and abdominal fold areas - Treated with Diflucan, continue with nystatin powder and keep area clean and dry  Essential hypertension -stable and well controlled -will monitor -No meds for now  Diabetes mellitus Type 2 controlled -Does not take any home medication, CBGs are controlled without insulin. -4/22 Hemoglobin A1c=6.5 -Lipid panel; LDL slightly elevated by 88 guidelines and HDL low will hold on adding additional medication at this time.   Hyperkalemia -Kayexalate 60 gm  2 doses -potassium WNL now  Metabolic acidosis -Continue sodium bicarbonate 1300 mg BID -renal on board, will follow rec's -after HD tx #1 and use of bicarbonate CO2 19-22  Back pain   -will continue PRN pain medications (Tramadol 50 mg QID PRN) -Unable to treat pain with NSAIDs or Tylenol secondary patient's renal and liver failure  Hypothermic -resolved -patient temp in 98-99 range  Code Status: FULL Family Communication: no family present at time of exam Disposition Plan: SNF  Consultants: Dr.Robert Doctor, hospital (nephrology) Dr. Rhea Pink (palliative care)  Procedure/Significant Events: 4/17 CT head without contrast;-no acute findings-small old lacune infarcts the right base a ganglia and left periventricular white matter.  4/17 ultrasound RUQ;1.0 cm gallstone lodged in the gallbladder neck with no evidence of cholecystitis.  4/18 CT abdomen pelvis without contrast;No acute intra-abdominal or intrapelvic abnormalities. -Minimal RIGHT pleural effusion and basilar atelectasis. -Cholelithiasis with otherwise unremarkable gallbladder.-Sigmoid diverticulosis. -Umbilical hernia containing fat. 4/18 echocardiogram;- Left ventricle: suggest that overall EF is probably normal. -(grade 1diastolic dysfunction). - Pulmonary arteries: PA peak pressure: 39 mm Hg  (S).  Culture No growth appreciated in any culture.  Antibiotics: Zosyn 4/17 > >stopped 4/20 Vancomycin>> stopped4/17   DVT  prophylaxis: Subcutaneous heparin  LINES / TUBES:  Temporary femoral triple diatek cath Peripheral IV  Continuous Infusions: . sodium chloride 50 mL/hr at 05/09/14 1900    Objective: VITAL SIGNS: Temp: 97.8 F (36.6 C) (04/25 2153) Temp Source: Oral (04/25 2153) BP: 157/89 mmHg (04/25 2153) Pulse Rate: 101 (04/25 2153) SPO2; FIO2:   Intake/Output Summary (Last 24 hours) at 05/09/14 2323 Last data filed at 05/09/14 2235  Gross per 24 hour  Intake 1879.17 ml  Output    475 ml  Net 1404.17 ml     Exam: General: more active, A/O 4,, No acute respiratory distress, follows all commands properly. S/P HD X 1 on 4/24 Lungs: Clear to auscultation bilaterally without wheezes or crackles Cardiovascular: Regular rate and rhythm, without murmur, gallops or rub normal S1 and S2 Abdomen: Nontender, nondistended, soft, bowel sounds positive, no rebound, no ascites, no appreciable mass Extremities: No significant cyanosis, clubbing. Bilateral +1 pitting edema to knees.     Data Reviewed: Basic Metabolic Panel:  Recent Labs Lab 05/03/14 1330  05/06/14 0323 05/06/14 1559 05/07/14 0302 05/08/14 0321 05/09/14 0606  NA 140  < > 134* 138 139  138 135 139  K 4.8  < > 5.2* 4.7 4.9  4.9 5.2* 4.2  CL 109  < > 104 104 106  105 103 102  CO2 19  < > 17* 20 17*  17* 19 22  GLUCOSE 141*  < > 123* 184* 129*  128* 168* 129*  BUN 68*  < > 88* 91* 95*  95* 103* 70*  CREATININE 5.40*  < > 7.91* 8.19* 8.89*  8.79* 9.14* 7.53*  CALCIUM 8.0*  < > 8.2* 8.2* 8.2*  8.1* 7.8* 7.6*  MG  --   --  2.1  --  2.1 2.1 2.0  PHOS 5.4*  --   --   --  9.0*  --   --   < > = values in this interval not displayed. Liver Function Tests:  Recent Labs Lab 05/05/14 0512 05/06/14 0323 05/07/14 0302 05/08/14 0321 05/09/14 0606  AST 177* 122* 102* 73* 73*  ALT 140* 124*  114* 103* 103*  ALKPHOS 61 59 60 59 60  BILITOT 0.6 0.6 0.6 0.5 0.6  PROT 5.1* 5.1* 5.1* 4.8* 4.8*  ALBUMIN 2.1* 2.0* 2.0*  2.0* 1.9* 2.0*   No results for input(s): LIPASE, AMYLASE in the last 168 hours.  Recent Labs Lab 05/06/14 0323  AMMONIA 17   CBC:  Recent Labs Lab 05/03/14 0330 05/04/14 0335 05/05/14 0512 05/07/14 0302 05/09/14 0606  WBC 7.8 7.7 6.5 6.7 6.9  NEUTROABS  --   --   --   --  4.2  HGB 10.3* 9.8* 9.8* 9.5* 8.7*  HCT 31.4* 28.5* 28.3* 27.4* 24.9*  MCV 81.3 78.7 77.7* 76.8* 77.8*  PLT 187 161 197 188 208   CBG:  Recent Labs Lab 05/08/14 2228 05/09/14 0729 05/09/14 1137 05/09/14 1626 05/09/14 2150  GLUCAP 122* 109* 129* 136* 122*    Recent Results (from the past 240 hour(s))  Culture, blood (routine x 2)     Status: None   Collection Time: 05/01/14  7:56 AM  Result Value Ref Range Status   Specimen Description BLOOD LEFT HAND  Final   Special Requests BOTTLES DRAWN AEROBIC ONLY 4 CC  Final   Culture   Final    NO GROWTH 5 DAYS Performed at [facility]    Report Status 05/07/2014 FINAL  Final  Culture, blood (  routine x 2)     Status: None   Collection Time: 05/01/14  7:56 AM  Result Value Ref Range Status   Specimen Description BLOOD RIGHT HAND  Final   Special Requests BOTTLES DRAWN AEROBIC AND ANAEROBIC 3 CC EACH  Final   Culture   Final    NO GROWTH 5 DAYS Performed at [facility]    Report Status 05/07/2014 FINAL  Final  MRSA PCR Screening     Status: None   Collection Time: 05/01/14 11:03 AM  Result Value Ref Range Status   MRSA by PCR NEGATIVE NEGATIVE Final    Comment:        The GeneXpert MRSA Assay (FDA approved for NASAL specimens only), is one component of a comprehensive MRSA colonization surveillance program. It is not intended to diagnose MRSA infection nor to guide or monitor treatment for MRSA infections.   Culture, Urine     Status: None   Collection Time: 05/03/14  5:19 AM  Result Value  Ref Range Status   Specimen Description URINE, CATHETERIZED  Final   Special Requests NONE  Final   Colony Count NO GROWTH Performed at [facility]   Final   Culture NO GROWTH Performed at [facility]   Final   Report Status 05/04/2014 FINAL  Final     Studies:  Recent x-ray studies have been reviewed in detail by the Attending Physician  Scheduled Meds:  Scheduled Meds: . aspirin EC  81 mg Oral Daily  . brimonidine  1 drop Right Eye BID  . docusate sodium  100 mg Oral BID  . feeding supplement (NEPRO CARB STEADY)  237 mL Oral BID  . heparin  5,000 Units Subcutaneous 3 times per day  . lidocaine (PF)  5 mL Other Once  . nystatin   Topical BID  . sodium bicarbonate  1,300 mg Oral BID  . sodium chloride  3 mL Intravenous Q12H    Time spent on care of this patient: 30 mins   Barton Dubois , MD [phone]  [facility] Office  [phone] Pager - [phone]  On-Call/Text Page:      [email or web address]      password [email or web address]  If 7PM-7AM, please contact night-coverage [email or web address] Password [facility] 05/09/2014, 11:23 PM   LOS: 8 days

## 2014-05-09 NOTE — Clinical Documentation Improvement (Signed)
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    Septic Shock and Severe Sepsis documented in 4/23 progress note by Dr. Chase Caller. Sepsis not found on Problem List  Please clarify if Sepsis with Septic Shock was: Present or evolving on admission                 Acquired after admission                  Other Condition                 Unable to Determine  Please document findings in next progress note and include in discharge summary if applicable.  Thank You, Zoila Shutter ,RN Clinical Documentation Specialist:  [facility] [employer]

## 2014-05-09 NOTE — Progress Notes (Signed)
Physical Therapy Treatment Patient Details Name: Curtis Brock MRN: [id number] DOB: 1925/12/18 Today's Date: 05/09/2014    History of Present Illness 81 yo male admitted with SIRS, sepsis, AMS. as of 4/24, pt with temporary femoral HD catheter; Hx of DM,CVA    PT Comments    Initial goal of session was to assist pt OOB via [place]; Noted temporary R Femoral HD catheter while assisting with bed mobility and hygiene, so held mobility; assisted pt back to a comfortable position in bed;   Discussed with Dr. Joelyn Oms, will discharge acute PT and be happy to reconsult if PT remains congruent with Goals of Care once temporary femoral HD catheter is out;   Of note, Mr. Senske is a [employer] fan.    Follow Up Recommendations  SNF;Supervision/Assistance - 24 hour     Equipment Recommendations  Other (comment) (to be determined)    Recommendations for Other Services       Precautions / Restrictions Precautions Precautions: Fall    Mobility  Bed Mobility Overal bed mobility: Needs Assistance Bed Mobility: Rolling Rolling: Max assist;+2 for physical assistance         General bed mobility comments: Assisted pt in rolling for bed mobility/hygeine; cues for technique, and noted pt participation/reaching for rails with hand over hand assist  Transfers                    Ambulation/Gait                 Stairs            Wheelchair Mobility    Modified Rankin (Stroke Patients Only)       Balance                                    Cognition Arousal/Alertness: Awake/alert Behavior During Therapy: WFL for tasks assessed/performed Overall Cognitive Status: History of cognitive impairments - at baseline                 General Comments: Pt seemed very happy when his primary care physician, Dr. Everlene Farrier, came to visit    Exercises      General Comments        Pertinent Vitals/Pain Pain Assessment: Faces Faces Pain Scale: Hurts  even more Pain Location: generalized with bed mobility Pain Descriptors / Indicators: Grimacing Pain Intervention(s): Limited activity within patient's tolerance;Repositioned    Home Living                      Prior Function            PT Goals (current goals can now be found in the care plan section) Acute Rehab PT Goals Patient Stated Goal: none stated Progress towards PT goals: Not progressing toward goals - comment (Must hold mobility die to temporary femoral HD catheter)    Frequency       PT Plan Current plan remains appropriate    Co-evaluation             End of Session Equipment Utilized During Treatment: Oxygen Activity Tolerance: Other (comment) (Temporary femoral HD catheter) Patient left: in bed;with call bell/phone within reach     Time: 1530-1600 PT Time Calculation (min) (ACUTE ONLY): 30 min  Charges:  $Therapeutic Activity: 8-22 mins  G Codes:      Roney Marion [facility] 05/09/2014, 4:23 PM  Roney Marion, [place] Pager [phone] Office [phone]

## 2014-05-10 ENCOUNTER — Inpatient Hospital Stay (HOSPITAL_COMMUNITY): Payer: Medicare Other

## 2014-05-10 DIAGNOSIS — T68XXXD Hypothermia, subsequent encounter: Secondary | ICD-10-CM

## 2014-05-10 DIAGNOSIS — L89153 Pressure ulcer of sacral region, stage 3: Secondary | ICD-10-CM

## 2014-05-10 DIAGNOSIS — N184 Chronic kidney disease, stage 4 (severe): Secondary | ICD-10-CM

## 2014-05-10 LAB — COMPREHENSIVE METABOLIC PANEL
ALT: 93 U/L — AB (ref 0–53)
ANION GAP: 12 (ref 5–15)
AST: 65 U/L — AB (ref 0–37)
Albumin: 1.9 g/dL — ABNORMAL LOW (ref 3.5–5.2)
Alkaline Phosphatase: 61 U/L (ref 39–117)
BUN: 80 mg/dL — ABNORMAL HIGH (ref 6–23)
CALCIUM: 7.9 mg/dL — AB (ref 8.4–10.5)
CO2: 24 mmol/L (ref 19–32)
CREATININE: 8.34 mg/dL — AB (ref 0.50–1.35)
Chloride: 105 mmol/L (ref 96–112)
GFR calc non Af Amer: 5 mL/min — ABNORMAL LOW (ref >90)
GFR, EST AFRICAN AMERICAN: 6 mL/min — AB (ref >90)
GLUCOSE: 97 mg/dL (ref 70–99)
Potassium: 4.4 mmol/L (ref 3.5–5.1)
Sodium: 141 mmol/L (ref 135–145)
Total Bilirubin: 0.6 mg/dL (ref 0.3–1.2)
Total Protein: 4.7 g/dL — ABNORMAL LOW (ref 6.0–8.3)

## 2014-05-10 LAB — GLUCOSE, CAPILLARY
GLUCOSE-CAPILLARY: 123 mg/dL — AB (ref 70–99)
Glucose-Capillary: 103 mg/dL — ABNORMAL HIGH (ref 70–99)
Glucose-Capillary: 140 mg/dL — ABNORMAL HIGH (ref 70–99)
Glucose-Capillary: 175 mg/dL — ABNORMAL HIGH (ref 70–99)

## 2014-05-10 LAB — MAGNESIUM: MAGNESIUM: 1.9 mg/dL (ref 1.5–2.5)

## 2014-05-10 MED ORDER — PRO-STAT SUGAR FREE PO LIQD
30.0000 mL | Freq: Every day | ORAL | Status: DC
  Administered 2014-05-10 – 2014-05-24 (×11): 30 mL via ORAL
  Filled 2014-05-10 (×15): qty 30

## 2014-05-10 MED ORDER — HEPARIN SODIUM (PORCINE) 1000 UNIT/ML IJ SOLN
2400.0000 [IU] | INTRAMUSCULAR | Status: DC
  Filled 2014-05-10: qty 2.4

## 2014-05-10 NOTE — Progress Notes (Signed)
RN went in with Phineas Real, LPN for bedside report. Upon entering, patient had pulled out both his R IJ hemodialysis catheter and his PIV out. Patient not actively bleeding, but RN noticed an old blood on the collar of his gown. NP on call notified. Vital signs taken; HR=95, SO2=97, BP=170/77, Tempt=97.9, Resp=20. Gauze dressing applied. Will continue to monitor.

## 2014-05-10 NOTE — Progress Notes (Signed)
Patient pull IV cath and IJ tubing out for Dailysis. MD notified.

## 2014-05-10 NOTE — Progress Notes (Signed)
Reported to [place] coming on for patient.

## 2014-05-10 NOTE — Care Management Note (Signed)
CARE MANAGEMENT NOTE 05/10/2014  Patient:  Curtis Brock, Curtis Brock   Account Number:  [id number]  Date Initiated:  05/02/2014  Documentation initiated by:  DAVIS,RHONDA  Subjective/Objective Assessment:   svt,poss. sepsis, low urinary outpt, low diasytolic pressures, abnormal ekg and elevated troponins.     Action/Plan:   tbd  05/10/2014 CM to room, pt sleeping no family present. Noted Pall consult ordered.   Anticipated DC Date:  05/14/2014   Anticipated DC Plan:  SKILLED NURSING FACILITY  In-house referral  NA      DC Planning Services  CM consult      Choice offered to / List presented to:             Status of service:  In process, will continue to follow Medicare Important Message given?  YES (If response is "NO", the following Medicare IM given date fields will be blank) Date Medicare IM given:  05/04/2014 Medicare IM given by:  MAYO,HENRIETTA Date Additional Medicare IM given:  05/10/2014 Additional Medicare IM given by:  [facility]  Discharge Disposition:    Per UR Regulation:  Reviewed for med. necessity/level of care/duration of stay  If discussed at [place] of Stay Meetings, dates discussed:    Comments:  05/10/2014 CM following for progression, pt has been to HD x1 with increasing alertness following treatment, family to meet with Pall care to discuss plan of care. Pt currently not ambulatory.  CRoyal RN MPH, [phone]  05/04/14 1041 Dol[place] RN MSN BSN CCM Pt transferred to [facility] 2/2 worsening renal function/potential need for HD.  Lives with dtr and her family, has cane, walker, and BSC.  [place], [place], BSN, [place] [phone] Chart Reviewed. Discharge needs at time of review: None present will follow for needs.

## 2014-05-10 NOTE — Progress Notes (Signed)
[facility] Progress Note   Subjective: No new events, can't do PT 2/2 femoral CVC   Filed Vitals:   05/09/14 1800 05/09/14 2153 05/10/14 0440 05/10/14 0935  BP:  157/89 137/65 140/75  Pulse:  101 96 98  Temp: 100.9 F (38.3 C) 97.8 F (36.6 C) 98.2 F (36.8 C) 98.1 F (36.7 C)  TempSrc: Oral Oral Oral Oral  Resp:  17 17 18   Height:      Weight:   114.5 kg (252 lb 6.8 oz)   SpO2:  97% 100% 100%   Exam: Elderly AAM, no distress, frail, pleasant Chest clear bilat RRR no MRG Abd obese, NTND, no mass or ascites, no HSM 1+ LE edema bilat Neuro alert, responsive, no asterixis, weak  Last creat 1.01 in Jan 2016   Assessment: 1. AKI shock/ ARB  1. HD #1 4/24  2. There is a reasonable indication for continuing hemodialysis but given his age, frailty, I'm worried about how much benefit he will receive 3. No clear evidence of renal recovery to this point 4. I explored this with the patient that no major changes are made 5. Plan for next dialysis today or tomorrow, will ask for assistance from critical care to transfer dialysis catheter to internal jugular to permit more therapy 6. Daily assessment based on UOP and labs 2. Shock/ low BP resolved, w/u neg abx stopped 3. HTN home meds on hold 4. Blind L eye 5. DM 6. Hx falls / debility     Pearson Grippe, MD  05/10/2014, 1:02 PM     Recent Labs Lab 05/03/14 1330  05/07/14 0302 05/08/14 0321 05/09/14 0606 05/10/14 0739  NA 140  < > 139  138 135 139 141  K 4.8  < > 4.9  4.9 5.2* 4.2 4.4  CL 109  < > 106  105 103 102 105  CO2 19  < > 17*  17* 19 22 24   GLUCOSE 141*  < > 129*  128* 168* 129* 97  BUN 68*  < > 95*  95* 103* 70* 80*  CREATININE 5.40*  < > 8.89*  8.79* 9.14* 7.53* 8.34*  CALCIUM 8.0*  < > 8.2*  8.1* 7.8* 7.6* 7.9*  PHOS 5.4*  --  9.0*  --   --   --   < > = values in this interval not displayed.  Recent Labs Lab 05/08/14 0321 05/09/14 0606 05/10/14 0739  AST 73* 73* 65*  ALT  103* 103* 93*  ALKPHOS 59 60 61  BILITOT 0.5 0.6 0.6  PROT 4.8* 4.8* 4.7*  ALBUMIN 1.9* 2.0* 1.9*    Recent Labs Lab 05/05/14 0512 05/07/14 0302 05/09/14 0606  WBC 6.5 6.7 6.9  NEUTROABS  --   --  4.2  HGB 9.8* 9.5* 8.7*  HCT 28.3* 27.4* 24.9*  MCV 77.7* 76.8* 77.8*  PLT 197 188 208   . aspirin EC  81 mg Oral Daily  . brimonidine  1 drop Right Eye BID  . docusate sodium  100 mg Oral BID  . feeding supplement (NEPRO CARB STEADY)  237 mL Oral BID  . heparin  5,000 Units Subcutaneous 3 times per day  . lidocaine (PF)  5 mL Other Once  . nystatin   Topical BID  . sodium bicarbonate  1,300 mg Oral BID  . sodium chloride  3 mL Intravenous Q12H   . sodium chloride 50 mL/hr at 05/10/14 0511   sodium chloride, sodium chloride, bisacodyl, feeding supplement (NEPRO CARB  STEADY), heparin, heparin, lidocaine (PF), lidocaine-prilocaine, ondansetron **OR** ondansetron (ZOFRAN) IV, pentafluoroprop-tetrafluoroeth, senna-docusate, traMADol

## 2014-05-10 NOTE — Progress Notes (Signed)
Pt alert and responsive. Pt needs assistance with feeding.  Denies pain at this time. Pt on air mattress.

## 2014-05-10 NOTE — Progress Notes (Signed)
Progress Note  Curtis Brock [name] DOB: [birth year] DOA: 05/01/2014 PCP: Jenny Reichmann, MD  Admit HPI / Brief Narrative: 81 y.o. 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 In ED workup was significant for acute renal failure with a creatinine of 2, hypothermia with temperature of 92.4, urinalysis was negative, no acute finding on chest x-ray, blood cultures were sent, CT head with no acute findings.   Patient's crt continued to increase after admission to [facility], he remained anuric despite being +7000 mL positive, and was therefore transferred to [facility] for possible need of hemodialysis in near future. Has so far received 1 HD treatment (4/24) and renal is discussing/debating regarding future treatment given patient overall health status and life quality declined.  HPI/Subjective: Afebrile, no CP or SOB. Patient is oriented to person and able to answer simple questions. Still confuse.   Assessment/Plan:  Acute renal failure / anuric. -Nephrology following; making urine, however  -patient initiated on temporary HD treatment -will follow clinical response and need for further HD -dramatic improvement with first treatment  SIRS -Patient meets SIRS criteria on admission given his hypothermia, tachypnea; but not findings supporting sepsis -features now resolved -No convincing radiographic or clinical evidence of infection; will continue monitoring off antibiotics for now -if patient spikes fever or leukocytosis will panculture and restart antibiotics  Elevated LFTs -ultrasound showing 1 cm gallstone lodged in the gallbladder neck with no evidence of cholecystitis, CT abdomen pelvis without contrast with no acute finding. -No abdominal pain.  -LFTs continuing to trend down -will monitor for now  Pressure ulcers/sacral decubitus ulcer -Wound care following -continue following  preventive measures -on air mattress   Acute encephalopathy - Patient with significant improvement in mentation after HD treatment -most lekly associated with uremia -will follow renal recommendation regarding further HD  History of CVA -Continue aspirin 81 mg daily for secondary prevention -no new deficit  Fungal rash in groin and abdominal fold areas - Treated with Diflucan, continue with nystatin powder and keep area clean and dry  Essential hypertension -stable and well controlled -will monitor -No meds for now  Diabetes mellitus Type 2 controlled -Does not take any home medication, CBGs are controlled without insulin. -4/22 Hemoglobin A1c=6.5 -Lipid panel; LDL slightly elevated by 88 guidelines and HDL low will hold on adding additional medication at this time.   Hyperkalemia -Kayexalate 60 gm  2 doses -potassium WNL now -will monitor  Metabolic acidosis -Continue sodium bicarbonate 1300 mg BID -renal on board, will follow rec's -after HD tx #1 and use of bicarbonate CO2 19-22  Back pain   -will continue PRN pain medications (Tramadol 50 mg QID PRN) -Unable to treat pain with NSAIDs or Tylenol secondary patient's renal and liver failure  Hypothermic -resolved -patient temp in 98-99 range  Code Status: FULL Family Communication: no family present at time of exam Disposition Plan: SNF  Consultants: Dr.Robert Doctor, hospital (nephrology) Dr. Rhea Pink (palliative care)  Procedure/Significant Events: 4/17 CT head without contrast;-no acute findings-small old lacune infarcts the right base a ganglia and left periventricular white matter.  4/17 ultrasound RUQ;1.0 cm gallstone lodged in the gallbladder neck with no evidence of cholecystitis.  4/18 CT abdomen pelvis without contrast;No acute intra-abdominal or intrapelvic abnormalities. -Minimal RIGHT pleural effusion and basilar atelectasis. -Cholelithiasis with otherwise unremarkable gallbladder.-Sigmoid  diverticulosis. -Umbilical hernia containing fat. 4/18 echocardiogram;- Left ventricle: suggest that overall EF is probably normal. -(grade 1diastolic  dysfunction). - Pulmonary arteries: PA peak pressure: 39 mm Hg (S).  Culture No growth appreciated in any culture.  Antibiotics: Zosyn 4/17 > >stopped 4/20 Vancomycin>> stopped4/17   DVT prophylaxis: Subcutaneous heparin  LINES / TUBES:  Temporary femoral triple diatek cath Peripheral IV  Continuous Infusions: . sodium chloride 50 mL/hr at 05/10/14 2242    Objective: VITAL SIGNS: Temp: 97.5 F (36.4 C) (04/26 2049) Temp Source: Oral (04/26 2049) BP: 163/77 mmHg (04/26 2049) Pulse Rate: 94 (04/26 2049) SPO2; FIO2:   Intake/Output Summary (Last 24 hours) at 05/10/14 2258 Last data filed at 05/10/14 1743  Gross per 24 hour  Intake    880 ml  Output    125 ml  Net    755 ml     Exam: General: more active, A/O 1,, No acute respiratory distress, follows simple commands and was more confused today. No HD in 2 days now. Lungs: Clear to auscultation bilaterally without wheezes or crackles Cardiovascular: Regular rate and rhythm, without murmur, gallops or rub normal S1 and S2 Abdomen: Nontender, nondistended, soft, bowel sounds positive, no rebound, no ascites, no appreciable mass Extremities: No significant cyanosis, clubbing. Bilateral +1 pitting edema to knees.    Data Reviewed: Basic Metabolic Panel:  Recent Labs Lab 05/06/14 0323 05/06/14 1559 05/07/14 0302 05/08/14 0321 05/09/14 0606 05/10/14 0739  NA 134* 138 139  138 135 139 141  K 5.2* 4.7 4.9  4.9 5.2* 4.2 4.4  CL 104 104 106  105 103 102 105  CO2 17* 20 17*  17* 19 22 24   GLUCOSE 123* 184* 129*  128* 168* 129* 97  BUN 88* 91* 95*  95* 103* 70* 80*  CREATININE 7.91* 8.19* 8.89*  8.79* 9.14* 7.53* 8.34*  CALCIUM 8.2* 8.2* 8.2*  8.1* 7.8* 7.6* 7.9*  MG 2.1  --  2.1 2.1 2.0 1.9  PHOS  --   --  9.0*  --   --   --    Liver Function  Tests:  Recent Labs Lab 05/06/14 0323 05/07/14 0302 05/08/14 0321 05/09/14 0606 05/10/14 0739  AST 122* 102* 73* 73* 65*  ALT 124* 114* 103* 103* 93*  ALKPHOS 59 60 59 60 61  BILITOT 0.6 0.6 0.5 0.6 0.6  PROT 5.1* 5.1* 4.8* 4.8* 4.7*  ALBUMIN 2.0* 2.0*  2.0* 1.9* 2.0* 1.9*    Recent Labs Lab 05/06/14 0323  AMMONIA 17   CBC:  Recent Labs Lab 05/04/14 0335 05/05/14 0512 05/07/14 0302 05/09/14 0606  WBC 7.7 6.5 6.7 6.9  NEUTROABS  --   --   --  4.2  HGB 9.8* 9.8* 9.5* 8.7*  HCT 28.5* 28.3* 27.4* 24.9*  MCV 78.7 77.7* 76.8* 77.8*  PLT 161 197 188 208   CBG:  Recent Labs Lab 05/09/14 2150 05/10/14 0753 05/10/14 1133 05/10/14 1625 05/10/14 2128  GLUCAP 122* 103* 140* 123* 175*    Recent Results (from the past 240 hour(s))  Culture, blood (routine x 2)     Status: None   Collection Time: 05/01/14  7:56 AM  Result Value Ref Range Status   Specimen Description BLOOD LEFT HAND  Final   Special Requests BOTTLES DRAWN AEROBIC ONLY 4 CC  Final   Culture   Final    NO GROWTH 5 DAYS Performed at [facility]    Report Status 05/07/2014 FINAL  Final  Culture, blood (routine x 2)     Status: None   Collection Time: 05/01/14  7:56 AM  Result Value  Ref Range Status   Specimen Description BLOOD RIGHT HAND  Final   Special Requests BOTTLES DRAWN AEROBIC AND ANAEROBIC 3 CC EACH  Final   Culture   Final    NO GROWTH 5 DAYS Performed at [facility]    Report Status 05/07/2014 FINAL  Final  MRSA PCR Screening     Status: None   Collection Time: 05/01/14 11:03 AM  Result Value Ref Range Status   MRSA by PCR NEGATIVE NEGATIVE Final    Comment:        The GeneXpert MRSA Assay (FDA approved for NASAL specimens only), is one component of a comprehensive MRSA colonization surveillance program. It is not intended to diagnose MRSA infection nor to guide or monitor treatment for MRSA infections.   Culture, Urine     Status: None   Collection Time:  05/03/14  5:19 AM  Result Value Ref Range Status   Specimen Description URINE, CATHETERIZED  Final   Special Requests NONE  Final   Colony Count NO GROWTH Performed at [facility]   Final   Culture NO GROWTH Performed at [facility]   Final   Report Status 05/04/2014 FINAL  Final     Studies:  Recent x-ray studies have been reviewed in detail by the Attending Physician  Scheduled Meds:  Scheduled Meds: . aspirin EC  81 mg Oral Daily  . brimonidine  1 drop Right Eye BID  . docusate sodium  100 mg Oral BID  . feeding supplement (NEPRO CARB STEADY)  237 mL Oral BID  . feeding supplement (PRO-STAT SUGAR FREE 64)  30 mL Oral Daily  . heparin  5,000 Units Subcutaneous 3 times per day  . lidocaine (PF)  5 mL Other Once  . nystatin   Topical BID  . sodium bicarbonate  1,300 mg Oral BID  . sodium chloride  3 mL Intravenous Q12H    Time spent on care of this patient: 30 mins   Barton Dubois , MD [phone]  If 7PM-7AM, please contact night-coverage [email or web address] Password [email or web address] 05/10/2014, 10:58 PM   LOS: 9 days

## 2014-05-10 NOTE — Progress Notes (Signed)
Notified MD Lorrene Reid (neprology) that patient had pulled hemodialysis catheter out. Patient placed on NPO order effective midnight per MD.

## 2014-05-10 NOTE — Progress Notes (Signed)
NUTRITION FOLLOW UP  DOCUMENTATION CODES Per approved criteria  -Obesity Unspecified   INTERVENTION: Continue Nepro Shake po BID, each supplement provides 425 kcal and 19 grams protein.  Provide 30 ml Prostat po once daily, each supplement provides 100 kcal and 15 grams of protein.   Encourage adequate PO intake.   NUTRITION DIAGNOSIS: Increased nutrient needs related to wound healing as evidenced by estimated nutrition needs; onging  Goal: Pt to meet >/= 90% of their estimated nutrition needs; progressing  Monitor:  PO & supplemental intake, weight, labs, I/O's  81 y.o. male  Admitting Dx: SIRS (systemic inflammatory response syndrome)  ASSESSMENT: [name] with PMH of diabetes mellitus (not taking any home medication), history of CVA, hyperlipidemia, lives at home with family; daughter found him sitting on a chair at 4 AM in the kitchen this morning, appears to be more confused, weak could not stand up, so she brought him to ED, in ED workup was significant for acute renal failure with a creatinine of 2, hypothermia with temperature of 92.4.  Procedure (4/26): Insertion of Central Venous Catheter Indications: dialysis  Patient reports a good appetite. Meal completion however has been 10-50%. Pt has been consuming his Nepro Shakes. Will continue with current orders. RD to additionally order Prostat to aid in protein needs as well as in wound healing. Pt was encouraged to eat his food at meals and to drink his supplements.   Labs: Low calcium and GFR. High BUN, creatinine, AST, and ALT.  Height: Ht Readings from Last 1 Encounters:  05/03/14 5\' 6"  (1.676 m)    Weight: Wt Readings from Last 1 Encounters:  05/10/14 252 lb 6.8 oz (114.5 kg)  Admit weight (4/17) 137 lbs *Weight trending up)* Net I/O's +11 L since admission  BMI:  Body mass index is 40.76 kg/(m^2).  Re-Estimated Nutritional Needs: Kcal: 1750-1950 Protein: 95-105 gm Fluid: Per MD  Skin: Stage II  pressure ulcer on L foot, +1 generalized edema  Diet Order: DIET DYS 3 Room service appropriate?: Yes; Fluid consistency:: Thin   Intake/Output Summary (Last 24 hours) at 05/10/14 1545 Last data filed at 05/10/14 0900  Gross per 24 hour  Intake   1797 ml  Output    500 ml  Net   1297 ml    Last BM: 4/25  Labs:   Recent Labs Lab 05/07/14 0302 05/08/14 0321 05/09/14 0606 05/10/14 0739  NA 139  138 135 139 141  K 4.9  4.9 5.2* 4.2 4.4  CL 106  105 103 102 105  CO2 17*  17* 19 22 24   BUN 95*  95* 103* 70* 80*  CREATININE 8.89*  8.79* 9.14* 7.53* 8.34*  CALCIUM 8.2*  8.1* 7.8* 7.6* 7.9*  MG 2.1 2.1 2.0 1.9  PHOS 9.0*  --   --   --   GLUCOSE 129*  128* 168* 129* 97    CBG (last 3)   Recent Labs  05/09/14 2150 05/10/14 0753 05/10/14 1133  GLUCAP 122* 103* 140*    Scheduled Meds: . aspirin EC  81 mg Oral Daily  . brimonidine  1 drop Right Eye BID  . docusate sodium  100 mg Oral BID  . feeding supplement (NEPRO CARB STEADY)  237 mL Oral BID  . heparin  5,000 Units Subcutaneous 3 times per day  . lidocaine (PF)  5 mL Other Once  . nystatin   Topical BID  . sodium bicarbonate  1,300 mg Oral BID  . sodium chloride  3 mL Intravenous Q12H    Continuous Infusions: . sodium chloride 50 mL/hr at 05/10/14 [id number]    Past Medical History  Diagnosis Date  . Diabetes mellitus without complication   . Hypertension   . Blind left eye     History reviewed. No pertinent past surgical history.   Kallie Locks, MS, RD, LDN Pager # [phone] After hours/ weekend pager # [phone]

## 2014-05-10 NOTE — Procedures (Signed)
Central Venous  Dialysis Catheter Insertion Procedure Note Curtis Brock [id number] Feb 26, 1925  Procedure: Insertion of Central Venous Catheter Indications: dialysis  Procedure Details Consent: Risks of procedure as well as the alternatives and risks of each were explained to the (patient/caregiver).  Consent for procedure obtained. Time Out: Verified patient identification, verified procedure, site/side was marked, verified correct patient position, special equipment/implants available, medications/allergies/relevent history reviewed, required imaging and test results available.  Performed Real time [place] was used to ID and cannulate the vessel  Maximum sterile technique was used including antiseptics, cap, gloves, gown, hand hygiene, mask and sheet. Skin prep: Chlorhexidine; local anesthetic administered A antimicrobial bonded/coated triple lumen catheter was placed in the right internal jugular vein using the Seldinger technique.  Evaluation Blood flow good Complications: No apparent complications Patient did tolerate procedure well. Chest X-ray ordered to verify placement.  CXR: pending.  [name],PETE 05/10/2014, 3:16 PM

## 2014-05-11 DIAGNOSIS — N170 Acute kidney failure with tubular necrosis: Principal | ICD-10-CM

## 2014-05-11 LAB — CBC
HCT: 25.8 % — ABNORMAL LOW (ref 39.0–52.0)
Hemoglobin: 8.7 g/dL — ABNORMAL LOW (ref 13.0–17.0)
MCH: 26.4 pg (ref 26.0–34.0)
MCHC: 33.7 g/dL (ref 30.0–36.0)
MCV: 78.2 fL (ref 78.0–100.0)
PLATELETS: 239 10*3/uL (ref 150–400)
RBC: 3.3 MIL/uL — AB (ref 4.22–5.81)
RDW: 13.8 % (ref 11.5–15.5)
WBC: 9.5 10*3/uL (ref 4.0–10.5)

## 2014-05-11 LAB — RENAL FUNCTION PANEL
ALBUMIN: 2 g/dL — AB (ref 3.5–5.2)
Anion gap: 12 (ref 5–15)
BUN: 91 mg/dL — ABNORMAL HIGH (ref 6–23)
CALCIUM: 8.2 mg/dL — AB (ref 8.4–10.5)
CHLORIDE: 105 mmol/L (ref 96–112)
CO2: 23 mmol/L (ref 19–32)
Creatinine, Ser: 8.89 mg/dL — ABNORMAL HIGH (ref 0.50–1.35)
GFR calc Af Amer: 5 mL/min — ABNORMAL LOW (ref >90)
GFR, EST NON AFRICAN AMERICAN: 5 mL/min — AB (ref >90)
GLUCOSE: 124 mg/dL — AB (ref 70–99)
POTASSIUM: 4.7 mmol/L (ref 3.5–5.1)
Phosphorus: 8.8 mg/dL — ABNORMAL HIGH (ref 2.3–4.6)
SODIUM: 140 mmol/L (ref 135–145)

## 2014-05-11 LAB — HEPATITIS B CORE ANTIBODY, TOTAL: Hep B Core Total Ab: NONREACTIVE

## 2014-05-11 LAB — HEPATITIS C ANTIBODY: HCV AB: NEGATIVE

## 2014-05-11 LAB — AMMONIA: Ammonia: 21 umol/L (ref 11–32)

## 2014-05-11 LAB — MAGNESIUM: Magnesium: 2 mg/dL (ref 1.5–2.5)

## 2014-05-11 LAB — GLUCOSE, CAPILLARY
GLUCOSE-CAPILLARY: 123 mg/dL — AB (ref 70–99)
Glucose-Capillary: 122 mg/dL — ABNORMAL HIGH (ref 70–99)
Glucose-Capillary: 143 mg/dL — ABNORMAL HIGH (ref 70–99)

## 2014-05-11 MED ORDER — HYDRALAZINE HCL 20 MG/ML IJ SOLN: 10.0000 mg | Freq: Three times a day (TID) | INTRAMUSCULAR | Status: DC | PRN

## 2014-05-11 NOTE — Progress Notes (Addendum)
This afternoon I had a meeting with patient's son, Charline Bills.  It also was about his current renal failure and whether or not we should continue with hemodialysis. Again Iaddressed that he is functionally doing poorly and is quite debilitated; given his age and duration of renal failure it is increasingly unlikely he will recover renal function. Though I advised against continue dialysis, I let him know that we could give it a try as long as we stayed in close communication and maintain honest feedback about how he was performing. Second option would be to pursue palliative measures at this time. He was undecided at the current time. He wishes to speak to his sister. Renal panel from today with stable potassium and serum bicarbonate. His volume status is stable. There is no acute/emergent indication for hemodialysis.  Pearson Grippe

## 2014-05-11 NOTE — Evaluation (Signed)
Physical Therapy Evaluation Patient Details Name: BRIANNA BERTOLET MRN: [id number] DOB: 03-Oct-1925 Today's Date: 05/11/2014   History of Present Illness  81 yo male admitted with SIRS, sepsis, AMS. as of 4/24, pt with temporary femoral HD catheter; Temporary femoral HD catheter removed 4/26, and PT reordered; Pt and family in ongoing discussion/decision-making re: plan of care, specifically, to pursue HD or not; Hx of DM,CVA  Clinical Impression   Pt admitted with above diagnosis. Pt currently with functional limitations due to the deficits listed below (see PT Problem List).   Session focused on assisting pt OOB, evaluate overall function; He presents with decr arousal today; Still, he participated in bed mobility and we were able to use the Maximove to help him OOB; He seemed to enjoy being OOB in recliner, visiting with son at end of session;   Re: dc planning; Mr. Tornes clearly needs a higher level of care than he has at home, and as long as he can participate, and if PT/functional mobility is in his goals of care, SNF for rehabilitation is appropriate; Would expect very slow progress;  Will take Palliative, Medicine, and Nephrology's lead;   While in-hospital, Pt will benefit from skilled PT to increase their independence and safety with mobility, decr caregiver burden, and to allow discharge to the venue listed below.       Follow Up Recommendations SNF;Supervision/Assistance - 24 hour    Equipment Recommendations  Other (comment) (to be determined, based on Goals of Care)    Recommendations for Other Services OT consult     Precautions / Restrictions Precautions Precautions: Fall      Mobility  Bed Mobility Overal bed mobility: Needs Assistance Bed Mobility: Rolling Rolling: Max assist;+2 for physical assistance         General bed mobility comments: Assisted pt in rolling for bed mobility/maximove pad placement; cues for technique, and noted pt participation/reaching for  rails with hand over hand assist  Transfers Overall transfer level: Needs assistance Equipment used:  (Maximove lift) Transfers:  (Maximove lift for out of bed)           General transfer comment: Employed Maximove for safe out of bed transfer due to height of [place] bed and risk of slipping off of side if sitting EOB; Tolerated being lifted in the lift pad well  Ambulation/Gait                Stairs            Wheelchair Mobility    Modified Rankin (Stroke Patients Only)       Balance Overall balance assessment: Needs assistance Sitting-balance support: Bilateral upper extremity supported;Feet supported Sitting balance-Leahy Scale: Poor Sitting balance - Comments: Abel to pull from supoprted sittign to unsupported sitting in recliner with mod assist and hands pulling on end of armrest; noted able to organize trunk and stabilize enough to sit at relative edge of chair briefly without suport Postural control: Right lateral lean (placed pillow at pt's Right side to encourage upright sittin)                                   Pertinent Vitals/Pain Pain Assessment: Faces Faces Pain Scale: Hurts even more Pain Location: LEs with rolling; does not seem to be hurting as much as last PT session, when pt was moaning while rolling Pain Descriptors / Indicators: Grimacing Pain Intervention(s): Monitored during session  Home Living Family/patient expects to be discharged to:: Private residence Living Arrangements: Children Available Help at Discharge: Family (alone during the day) Type of Home: House Home Access: Stairs to enter Entrance Stairs-Rails: Right Entrance Stairs-Number of Steps: 3 Home Layout: Multi-level Home Equipment: Walker - 2 wheels;Cane - single point;Wheelchair - Careers adviser Dominance   Dominant Hand: Right    Extremity/Trunk Assessment   Upper Extremity Assessment:  Generalized weakness (with limited initiation)           Lower Extremity Assessment: Generalized weakness;Difficult to assess due to impaired cognition      Cervical / Trunk Assessment: Kyphotic  Communication   Communication: Other (comment) (very sleepy)  Cognition Arousal/Alertness: Lethargic (Eyes closed most of session) Behavior During Therapy: WFL for tasks assessed/performed Overall Cognitive Status: No family/caregiver present to determine baseline cognitive functioning Area of Impairment: Orientation;Attention;Following commands;Safety/judgement;Problem solving       Following Commands: Follows one step commands with increased time     Problem Solving: Slow processing;Decreased initiation General Comments: Opened eyes when cued; majority of session with eyes closed; did answer the "Lares" when asked what his favorite football team is    General Comments General comments (skin integrity, edema, etc.): will request pressure redistribution cushion for more tiem in the chair    Exercises        Assessment/Plan    PT Assessment Patient needs continued PT services  PT Diagnosis Difficulty walking;Generalized weakness;Altered mental status   PT Problem List Decreased strength;Decreased activity tolerance;Decreased balance;Decreased mobility;Decreased knowledge of use of DME;Decreased safety awareness;Decreased cognition  PT Treatment Interventions DME instruction;Gait training;Functional mobility training;Therapeutic exercise;Balance training;Patient/family education;Therapeutic activities   PT Goals (Current goals can be found in the Care Plan section) Acute Rehab PT Goals Patient Stated Goal: none stated PT Goal Formulation: Patient unable to participate in goal setting Time For Goal Achievement: 05/25/14 Potential to Achieve Goals: Fair    Frequency Min 3X/week   Barriers to discharge Decreased caregiver support      Co-evaluation                End of Session   Activity Tolerance: Patient limited by fatigue (decr arousal) Patient left: in chair;with call bell/phone within reach;with family/visitor present Nurse Communication: Mobility status;Need for lift equipment         Time: 1412-1435 PT Time Calculation (min) (ACUTE ONLY): 23 min   Charges:   PT Evaluation $PT Re-evaluation: 1 Procedure PT Treatments $Therapeutic Activity: 8-22 mins   PT G Codes:        Quin Hoop 05/11/2014, 4:20 PM  [place]oney [place], [place]  Acute Rehabilitation Services Pager [phone] Office [phone]

## 2014-05-11 NOTE — Progress Notes (Signed)
Palliative Medicine Team Follow-up  Palliative consultation done on 4/21 at which time Curtis Brock was very clear on his wishes to have QOL and maintain his independence and also his desire to be DNR which I confirmed with his son-in-law who was present and provided information on this. At the time of my consult Curtis Brock was certainly not interested in long term HD, but his son-in-law was in favor of HD and works for [employer] [facility] taking people to HD who are "in much worse shape". In general Curtis Brock is agreeable but when given the opportunity to speak and time to respond he is able to provide valuable insight into his own wishes.   PMT is available to assist with any additional goals of care needs or conversation pending the decision to pursue or not to pursue long term HD. Please call [phone] to discuss any specific needs or questions regarding his care.  Curtis Hacker, DO Palliative Medicine

## 2014-05-11 NOTE — Progress Notes (Signed)
[facility] Progress Note   Subjective:  Femoral dialysis catheter removed yesterday and non-tunneled internal jugular catheter placed. Overnight patient became agitated and pulled the catheter out. His morning he remains awake but confused. I have sat down spoken with the patient's daughter. We discussed that there is no evidence of recovery of kidney functions at this point. He continues to be very weak and worsening in terms of his strength on a daily basis. The original initiation of hemodialysis was on a short-term basis to see if he recovered. At this point we discussed that we can either replace the dialysis catheter and continue to perform hemodialysis with frequent medication between patient/family and treatment team in terms of how much effect and improvement is being seen. I discussed my concerns that we might not impact his overall/global health through hemodialysis. Further, the procedure itself will come with a certain amount of discomfort and suffering. I also discussed that we could decide to not perform further hemodialysis and pursue palliative measures, even potentially home hospice. The daughter was contemplative of both options and had no immediate decision. She will think about this and let me know which way she thinks is the proper path.  As of right now I let her know that we are not going to be performing dialysis or replacing the dialysis catheter until we hear back from her.   Filed Vitals:   05/10/14 2049 05/11/14 0122 05/11/14 0602 05/11/14 0927  BP: 163/77 147/68 145/77 156/58  Pulse: 94  94 99  Temp: 97.5 F (36.4 C)  98 F (36.7 C) 97.6 F (36.4 C)  TempSrc: Oral  Oral Oral  Resp: 18  18 18   Height:      Weight: 118.8 kg (261 lb 14.5 oz)     SpO2: 100%  96% 99%   Exam: Elderly AAM, no distress, frail, pleasant Chest clear bilat RRR no MRG Abd obese, NTND, no mass or ascites, no HSM 1+ LE edema bilat Neuro alert, responsive, no asterixis,  weak  Last creat 1.01 in Jan 2016   Assessment: 1. AKI shock/ ARB  1. HD #1 4/24  2. See above in Subjective 4/27 3. Cont to follow closely for recovery, update RP today 2. Shock/ low BP resolved, w/u neg abx stopped 3. HTN home meds on hold 4. Blind L eye 5. DM 6. Hx falls / debility     Pearson Grippe, MD  05/11/2014, 10:26 AM     Recent Labs Lab 05/07/14 0302 05/08/14 0321 05/09/14 0606 05/10/14 0739  NA 139  138 135 139 141  K 4.9  4.9 5.2* 4.2 4.4  CL 106  105 103 102 105  CO2 17*  17* 19 22 24   GLUCOSE 129*  128* 168* 129* 97  BUN 95*  95* 103* 70* 80*  CREATININE 8.89*  8.79* 9.14* 7.53* 8.34*  CALCIUM 8.2*  8.1* 7.8* 7.6* 7.9*  PHOS 9.0*  --   --   --     Recent Labs Lab 05/08/14 0321 05/09/14 0606 05/10/14 0739  AST 73* 73* 65*  ALT 103* 103* 93*  ALKPHOS 59 60 61  BILITOT 0.5 0.6 0.6  PROT 4.8* 4.8* 4.7*  ALBUMIN 1.9* 2.0* 1.9*    Recent Labs Lab 05/05/14 0512 05/07/14 0302 05/09/14 0606  WBC 6.5 6.7 6.9  NEUTROABS  --   --  4.2  HGB 9.8* 9.5* 8.7*  HCT 28.3* 27.4* 24.9*  MCV 77.7* 76.8* 77.8*  PLT 197 188 208   .  aspirin EC  81 mg Oral Daily  . brimonidine  1 drop Right Eye BID  . docusate sodium  100 mg Oral BID  . feeding supplement (NEPRO CARB STEADY)  237 mL Oral BID  . feeding supplement (PRO-STAT SUGAR FREE 64)  30 mL Oral Daily  . heparin  5,000 Units Subcutaneous 3 times per day  . lidocaine (PF)  5 mL Other Once  . nystatin   Topical BID  . sodium bicarbonate  1,300 mg Oral BID  . sodium chloride  3 mL Intravenous Q12H   . sodium chloride 50 mL/hr at 05/10/14 2242   sodium chloride, sodium chloride, bisacodyl, feeding supplement (NEPRO CARB STEADY), heparin, heparin, lidocaine (PF), lidocaine-prilocaine, ondansetron **OR** ondansetron (ZOFRAN) IV, pentafluoroprop-tetrafluoroeth, senna-docusate, traMADol

## 2014-05-11 NOTE — Progress Notes (Signed)
Progress Note  MC FECKO [name] DOB: Oct 25, 1925 DOA: 05/01/2014 PCP: Jenny Reichmann, MD  Admit HPI / Brief Narrative: 81 y.o. 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 In ED workup was significant for acute renal failure with a creatinine of 2, hypothermia with temperature of 92.4, urinalysis was negative, no acute finding on chest x-ray, blood cultures were sent, CT head with no acute findings.   Patient's crt continued to increase after admission to [facility], he remained anuric despite being +7000 mL positive, and was therefore transferred to [facility] for possible need of hemodialysis in near future. Has so far received 1 HD treatment (4/24) and renal is discussing/debating regarding future treatment given patient overall health status and life quality declined.  HPI/Subjective: Alert, follows commands. Denies abdominal pain, chest pain.  He was able to tell me he is at [facility].   Assessment/Plan:  Acute renal failure / anuric. -Nephrology following.  -patient initiated on temporary HD treatment -Patient remove/pull  HD catheter 4-26. -Family to make decision regarding continuing with dialysis.     SIRS -Patient meets SIRS criteria on admission given his hypothermia, tachypnea; but not findings supporting sepsis -resolved.  -No convincing radiographic or clinical evidence of infection; will continue monitoring off antibiotics for now  Elevated LFTs -ultrasound showing 1 cm gallstone lodged in the gallbladder neck with no evidence of cholecystitis, CT abdomen pelvis without contrast with no acute finding. -No abdominal pain.  -LFTs continuing to trend down -will monitor for now  Pressure ulcers/sacral decubitus ulcer -Wound care following -continue following preventive measures -on air mattress   Acute encephalopathy -most lekly associated with  uremia -Will follow renal recommendation regarding further HD -check ammonia level.   History of CVA -Continue aspirin 81 mg daily for secondary prevention -no new deficit  Fungal rash in groin and abdominal fold areas - Treated with Diflucan, continue with nystatin powder and keep area clean and dry  Essential hypertension -Continue to hold lasix and Cozaar in setting of renal failure.  -Will order PRN Hydralazine.   Diabetes mellitus Type 2 controlled -Does not take any home medication, CBGs are controlled without insulin. -4/22 Hemoglobin A1c=6.5 -Lipid panel; LDL slightly elevated by 88 guidelines and HDL low will hold on adding additional medication at this time. Also patient with elevated LFT>   Hyperkalemia -Kayexalate 60 gm  2 doses -potassium WNL now -will monitor  Metabolic acidosis -Continue sodium bicarbonate 1300 mg BID -renal on board, will follow rec's  Back pain   -will continue PRN pain medications (Tramadol 50 mg QID PRN) -Unable to treat pain with NSAIDs or Tylenol secondary patient's renal and liver failure  Hypothermic -resolved -patient temp in 98-99 range  Code Status: FULL Family Communication: daughter at bedside.  Disposition Plan: SNF  Consultants: Dr.Robert Doctor, hospital (nephrology) Dr. Rhea Pink (palliative care)  Procedure/Significant Events: 4/17 CT head without contrast;-no acute findings-small old lacune infarcts the right base a ganglia and left periventricular white matter.  4/17 ultrasound RUQ;1.0 cm gallstone lodged in the gallbladder neck with no evidence of cholecystitis.  4/18 CT abdomen pelvis without contrast;No acute intra-abdominal or intrapelvic abnormalities. -Minimal RIGHT pleural effusion and basilar atelectasis. -Cholelithiasis with otherwise unremarkable gallbladder.-Sigmoid diverticulosis. -Umbilical hernia containing fat. 4/18 echocardiogram;- Left ventricle: suggest that overall EF is probably normal. -(grade  1diastolic dysfunction). - Pulmonary arteries: PA peak pressure: 39 mm Hg (S).  Culture No growth appreciated  in any culture.  Antibiotics: Zosyn 4/17 > >stopped 4/20 Vancomycin>> stopped4/17   DVT prophylaxis: Subcutaneous heparin  LINES / TUBES:  Temporary femoral triple diatek cath Peripheral IV  Continuous Infusions: . sodium chloride 50 mL/hr at 05/10/14 2242    Objective: VITAL SIGNS: Temp: 97.6 F (36.4 C) (04/27 0927) Temp Source: Oral (04/27 0927) BP: 156/58 mmHg (04/27 0927) Pulse Rate: 99 (04/27 0927) SPO2; FIO2:   Intake/Output Summary (Last 24 hours) at 05/11/14 1104 Last data filed at 05/11/14 [id number]  Gross per 24 hour  Intake   1440 ml  Output    251 ml  Net   1189 ml     Exam: General: more active, A/O 1,, No acute respiratory distress.  Lungs: Clear to auscultation bilaterally without wheezes or crackles Cardiovascular: Regular rate and rhythm, without murmur, gallops or rub normal S1 and S2 Abdomen: Nontender, nondistended, soft, bowel sounds positive, no rebound, no ascites, no appreciable mass Extremities: No significant cyanosis, clubbing. Bilateral +1 pitting edema to knees.    Data Reviewed: Basic Metabolic Panel:  Recent Labs Lab 05/06/14 0323 05/06/14 1559 05/07/14 0302 05/08/14 0321 05/09/14 0606 05/10/14 0739  NA 134* 138 139  138 135 139 141  K 5.2* 4.7 4.9  4.9 5.2* 4.2 4.4  CL 104 104 106  105 103 102 105  CO2 17* 20 17*  17* 19 22 24   GLUCOSE 123* 184* 129*  128* 168* 129* 97  BUN 88* 91* 95*  95* 103* 70* 80*  CREATININE 7.91* 8.19* 8.89*  8.79* 9.14* 7.53* 8.34*  CALCIUM 8.2* 8.2* 8.2*  8.1* 7.8* 7.6* 7.9*  MG 2.1  --  2.1 2.1 2.0 1.9  PHOS  --   --  9.0*  --   --   --    Liver Function Tests:  Recent Labs Lab 05/06/14 0323 05/07/14 0302 05/08/14 0321 05/09/14 0606 05/10/14 0739  AST 122* 102* 73* 73* 65*  ALT 124* 114* 103* 103* 93*  ALKPHOS 59 60 59 60 61  BILITOT 0.6 0.6 0.5 0.6 0.6  PROT  5.1* 5.1* 4.8* 4.8* 4.7*  ALBUMIN 2.0* 2.0*  2.0* 1.9* 2.0* 1.9*    Recent Labs Lab 05/06/14 0323  AMMONIA 17   CBC:  Recent Labs Lab 05/05/14 0512 05/07/14 0302 05/09/14 0606  WBC 6.5 6.7 6.9  NEUTROABS  --   --  4.2  HGB 9.8* 9.5* 8.7*  HCT 28.3* 27.4* 24.9*  MCV 77.7* 76.8* 77.8*  PLT 197 188 208   CBG:  Recent Labs Lab 05/09/14 2150 05/10/14 0753 05/10/14 1133 05/10/14 1625 05/10/14 2128  GLUCAP 122* 103* 140* 123* 175*    Recent Results (from the past 240 hour(s))  Culture, Urine     Status: None   Collection Time: 05/03/14  5:19 AM  Result Value Ref Range Status   Specimen Description URINE, CATHETERIZED  Final   Special Requests NONE  Final   Colony Count NO GROWTH Performed at [facility]   Final   Culture NO GROWTH Performed at [facility]   Final   Report Status 05/04/2014 FINAL  Final     Studies:  Recent x-ray studies have been reviewed in detail by the Attending Physician  Scheduled Meds:  Scheduled Meds: . aspirin EC  81 mg Oral Daily  . brimonidine  1 drop Right Eye BID  . docusate sodium  100 mg Oral BID  . feeding supplement (NEPRO CARB STEADY)  237 mL Oral BID  . feeding  supplement (PRO-STAT SUGAR FREE 64)  30 mL Oral Daily  . heparin  5,000 Units Subcutaneous 3 times per day  . lidocaine (PF)  5 mL Other Once  . nystatin   Topical BID  . sodium bicarbonate  1,300 mg Oral BID  . sodium chloride  3 mL Intravenous Q12H    Time spent on care of this patient: 30 mins   Regalado, Belkys A , MD [phone]  If 7PM-7AM, please contact night-coverage [email or web address] Password [email or web address] 05/11/2014, 11:04 AM   LOS: 10 days

## 2014-05-12 ENCOUNTER — Inpatient Hospital Stay (HOSPITAL_COMMUNITY): Payer: Medicare Other

## 2014-05-12 DIAGNOSIS — N179 Acute kidney failure, unspecified: Secondary | ICD-10-CM | POA: Insufficient documentation

## 2014-05-12 LAB — RENAL FUNCTION PANEL
Albumin: 2.1 g/dL — ABNORMAL LOW (ref 3.5–5.2)
Anion gap: 14 (ref 5–15)
BUN: 97 mg/dL — AB (ref 6–23)
CO2: 23 mmol/L (ref 19–32)
CREATININE: 9.97 mg/dL — AB (ref 0.50–1.35)
Calcium: 8.2 mg/dL — ABNORMAL LOW (ref 8.4–10.5)
Chloride: 101 mmol/L (ref 96–112)
GFR calc Af Amer: 5 mL/min — ABNORMAL LOW (ref >90)
GFR calc non Af Amer: 4 mL/min — ABNORMAL LOW (ref >90)
GLUCOSE: 126 mg/dL — AB (ref 70–99)
PHOSPHORUS: 9.2 mg/dL — AB (ref 2.3–4.6)
Potassium: 4.6 mmol/L (ref 3.5–5.1)
Sodium: 138 mmol/L (ref 135–145)

## 2014-05-12 LAB — GLUCOSE, CAPILLARY
GLUCOSE-CAPILLARY: 133 mg/dL — AB (ref 70–99)
Glucose-Capillary: 137 mg/dL — ABNORMAL HIGH (ref 70–99)
Glucose-Capillary: 112 mg/dL — ABNORMAL HIGH (ref 70–99)

## 2014-05-12 LAB — MAGNESIUM: Magnesium: 2 mg/dL (ref 1.5–2.5)

## 2014-05-12 LAB — APTT: APTT: 34 s (ref 24–37)

## 2014-05-12 LAB — PROTIME-INR
INR: 1.12 (ref 0.00–1.49)
Prothrombin Time: 14.5 seconds (ref 11.6–15.2)

## 2014-05-12 MED ORDER — BISACODYL 10 MG RE SUPP: 10.0000 mg | Freq: Once | RECTAL | Status: DC

## 2014-05-12 MED ORDER — LIDOCAINE HCL 1 % IJ SOLN
INTRAMUSCULAR | Status: AC
  Filled 2014-05-12: qty 20

## 2014-05-12 MED ORDER — DEXTROSE 5 % IV SOLN
3.0000 g | INTRAVENOUS | Status: AC
  Administered 2014-05-12: 3 g via INTRAVENOUS
  Filled 2014-05-12 (×2): qty 3000

## 2014-05-12 MED ORDER — SORBITOL 70 % SOLN
30.0000 mL | Freq: Every day | Status: DC | PRN
Start: 2014-05-12 — End: 2014-05-24

## 2014-05-12 MED ORDER — FENTANYL CITRATE (PF) 100 MCG/2ML IJ SOLN
INTRAMUSCULAR | Status: AC
  Filled 2014-05-12: qty 2

## 2014-05-12 MED ORDER — DEXTROSE 5 % IV SOLN: 3.0000 g | INTRAVENOUS | Status: DC

## 2014-05-12 MED ORDER — GELATIN ABSORBABLE 12-7 MM EX MISC
CUTANEOUS | Status: AC
  Filled 2014-05-12: qty 1

## 2014-05-12 MED ORDER — MIDAZOLAM HCL 2 MG/2ML IJ SOLN
INTRAMUSCULAR | Status: AC
  Filled 2014-05-12: qty 2

## 2014-05-12 MED ORDER — HEPARIN SODIUM (PORCINE) 1000 UNIT/ML DIALYSIS
20.0000 [IU]/kg | INTRAMUSCULAR | Status: DC | PRN
  Filled 2014-05-12: qty 3

## 2014-05-12 MED ORDER — FENTANYL CITRATE (PF) 100 MCG/2ML IJ SOLN
INTRAMUSCULAR | Status: AC | PRN
  Administered 2014-05-12 (×4): 25 ug via INTRAVENOUS

## 2014-05-12 MED ORDER — MIDAZOLAM HCL 2 MG/2ML IJ SOLN
INTRAMUSCULAR | Status: AC | PRN
  Administered 2014-05-12 (×4): 0.5 mg via INTRAVENOUS

## 2014-05-12 MED ORDER — DM-GUAIFENESIN ER 30-600 MG PO TB12
1.0000 | ORAL_TABLET | Freq: Two times a day (BID) | ORAL | Status: DC
  Administered 2014-05-13 – 2014-05-14 (×3): 1 via ORAL
  Filled 2014-05-12 (×6): qty 1

## 2014-05-12 MED ORDER — HEPARIN SODIUM (PORCINE) 1000 UNIT/ML IJ SOLN
INTRAMUSCULAR | Status: AC
  Filled 2014-05-12: qty 1

## 2014-05-12 NOTE — Sedation Documentation (Addendum)
Successful HD cath placed

## 2014-05-12 NOTE — Procedures (Signed)
Placement of right IJ HD catheter.  Tip at SVC/RA junction.  No immediate complication.  Minimal blood loss.  Catheter is ready to use.

## 2014-05-12 NOTE — Procedures (Signed)
I was present at this session.  I have reviewed the session itself and made appropriate changes.  BFR 250, RIJ cath.  tol HD  Curtis Brock,[name] 4/28/20169:55 PM

## 2014-05-12 NOTE — Progress Notes (Signed)
Progress Note  Curtis Brock [name] DOB: [birth year] DOA: 05/01/2014 PCP: Jenny Reichmann, MD  Admit HPI / Brief Narrative: 81 y.o. 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 In ED workup was significant for acute renal failure with a creatinine of 2, hypothermia with temperature of 92.4, urinalysis was negative, no acute finding on chest x-ray, blood cultures were sent, CT head with no acute findings.   Patient's crt continued to increase after admission to [facility], he remained anuric despite being +7000 mL positive, and was therefore transferred to [facility] for possible need of hemodialysis in near future. Has so far received 1 HD treatment (4/24) and renal is discussing/debating regarding future treatment given patient overall health status and life quality declined.  HPI/Subjective: He has been coughing this morning, dry cough, he has eyes close but answer some questions. He denies abdominal pain. He denies passing gas.   Assessment/Plan:  Acute renal failure / anuric. -Nephrology following.  -patient initiated on temporary HD treatment -Patient remove/pull  HD catheter 4-26. -Family wants to try dialysis for now. Plan for HD catheter placement.   Constipation;  Check KUB.  Will order dulcolax suppository.   Cough; start guaifenesin.   SIRS -Patient meets SIRS criteria on admission given his hypothermia, tachypnea; but not findings supporting sepsis -resolved.  -No convincing radiographic or clinical evidence of infection; will continue monitoring off antibiotics for now  Elevated LFTs -ultrasound showing 1 cm gallstone lodged in the gallbladder neck with no evidence of cholecystitis, CT abdomen pelvis without contrast with no acute finding. -No abdominal pain.  -LFTs continuing to trend down -will monitor for now  Pressure ulcers/sacral decubitus ulcer -Wound care  following -continue following preventive measures -on air mattress   Acute encephalopathy -most lekly associated with uremia - ammonia level at 21.   History of CVA -Continue aspirin 81 mg daily for secondary prevention -no new deficit  Fungal rash in groin and abdominal fold areas - Treated with Diflucan, continue with nystatin powder and keep area clean and dry  Essential hypertension -Continue to hold lasix and Cozaar in setting of renal failure.  -Will order PRN Hydralazine.   Diabetes mellitus Type 2 controlled -Does not take any home medication, CBGs are controlled without insulin. -4/22 Hemoglobin A1c=6.5 -Lipid panel; LDL slightly elevated by 88 guidelines and HDL low will hold on adding additional medication at this time. Also patient with elevated LFT>   Hyperkalemia -Kayexalate 60 gm  2 doses -potassium WNL now -will monitor  Metabolic acidosis -Continue sodium bicarbonate 1300 mg BID -renal on board, will follow rec's  Back pain   -will continue PRN pain medications (Tramadol 50 mg QID PRN) -Unable to treat pain with NSAIDs or Tylenol secondary patient's renal and liver failure  Hypothermic -resolved  Code Status: DNR Family Communication: daughter at bedside.  Disposition Plan: SNF  Consultants: Dr.Robert Doctor, hospital (nephrology) Dr. Rhea Pink (palliative care)  Procedure/Significant Events: 4/17 CT head without contrast;-no acute findings-small old lacune infarcts the right base a ganglia and left periventricular white matter.  4/17 ultrasound RUQ;1.0 cm gallstone lodged in the gallbladder neck with no evidence of cholecystitis.  4/18 CT abdomen pelvis without contrast;No acute intra-abdominal or intrapelvic abnormalities. -Minimal RIGHT pleural effusion and basilar atelectasis. -Cholelithiasis with otherwise unremarkable gallbladder.-Sigmoid diverticulosis. -Umbilical hernia containing fat. 4/18 echocardiogram;- Left ventricle: suggest that  overall EF is probably normal. -(grade 1diastolic dysfunction). - Pulmonary arteries: PA  peak pressure: 39 mm Hg (S).  Culture No growth appreciated in any culture.  Antibiotics: Zosyn 4/17 > >stopped 4/20 Vancomycin>> stopped4/17   DVT prophylaxis: Subcutaneous heparin  LINES / TUBES:       Objective: VITAL SIGNS: Temp: 97.7 F (36.5 C) (04/28 0815) Temp Source: Oral (04/28 0815) BP: 146/68 mmHg (04/28 0815) Pulse Rate: 92 (04/28 0815) SPO2; FIO2:   Intake/Output Summary (Last 24 hours) at 05/12/14 1034 Last data filed at 05/12/14 0915  Gross per 24 hour  Intake    521 ml  Output    251 ml  Net    270 ml     Exam: General: No acute respiratory distress. Eyes close, answer some questions.  Lungs: bilateral ronchus.  Cardiovascular: Regular rate and rhythm, without murmur, gallops or rub normal S1 and S2 Abdomen: Nontender, nondistended, soft, bowel sounds positive, no rebound,  Extremities: No significant cyanosis, clubbing. Bilateral +1 pitting edema to knees.    Data Reviewed: Basic Metabolic Panel:  Recent Labs Lab 05/07/14 0302 05/08/14 0321 05/09/14 0606 05/10/14 0739 05/11/14 1040 05/11/14 1058 05/12/14 0530  NA 139  138 135 139 141  --  140 138  K 4.9  4.9 5.2* 4.2 4.4  --  4.7 4.6  CL 106  105 103 102 105  --  105 101  CO2 17*  17* 19 22 24   --  23 23  GLUCOSE 129*  128* 168* 129* 97  --  124* 126*  BUN 95*  95* 103* 70* 80*  --  91* 97*  CREATININE 8.89*  8.79* 9.14* 7.53* 8.34*  --  8.89* 9.97*  CALCIUM 8.2*  8.1* 7.8* 7.6* 7.9*  --  8.2* 8.2*  MG 2.1 2.1 2.0 1.9 2.0  --  2.0  PHOS 9.0*  --   --   --   --  8.8* 9.2*   Liver Function Tests:  Recent Labs Lab 05/06/14 0323 05/07/14 0302 05/08/14 0321 05/09/14 0606 05/10/14 0739 05/11/14 1058 05/12/14 0530  AST 122* 102* 73* 73* 65*  --   --   ALT 124* 114* 103* 103* 93*  --   --   ALKPHOS 59 60 59 60 61  --   --   BILITOT 0.6 0.6 0.5 0.6 0.6  --   --   PROT 5.1*  5.1* 4.8* 4.8* 4.7*  --   --   ALBUMIN 2.0* 2.0*  2.0* 1.9* 2.0* 1.9* 2.0* 2.1*    Recent Labs Lab 05/06/14 0323 05/11/14 1345  AMMONIA 17 21   CBC:  Recent Labs Lab 05/07/14 0302 05/09/14 0606 05/11/14 1058  WBC 6.7 6.9 9.5  NEUTROABS  --  4.2  --   HGB 9.5* 8.7* 8.7*  HCT 27.4* 24.9* 25.8*  MCV 76.8* 77.8* 78.2  PLT 188 208 239   CBG:  Recent Labs Lab 05/10/14 2128 05/11/14 1136 05/11/14 1704 05/11/14 2120 05/12/14 0756  GLUCAP 175* 122* 143* 123* 133*    Recent Results (from the past 240 hour(s))  Culture, Urine     Status: None   Collection Time: 05/03/14  5:19 AM  Result Value Ref Range Status   Specimen Description URINE, CATHETERIZED  Final   Special Requests NONE  Final   Colony Count NO GROWTH Performed at [facility]   Final   Culture NO GROWTH Performed at [facility]   Final   Report Status 05/04/2014 FINAL  Final     Studies:  Recent x-ray studies have been  reviewed in detail by the Attending Physician  Scheduled Meds:  Scheduled Meds: . aspirin EC  81 mg Oral Daily  . brimonidine  1 drop Right Eye BID  . dextromethorphan-guaiFENesin  1 tablet Oral BID  . docusate sodium  100 mg Oral BID  . feeding supplement (NEPRO CARB STEADY)  237 mL Oral BID  . feeding supplement (PRO-STAT SUGAR FREE 64)  30 mL Oral Daily  . heparin  5,000 Units Subcutaneous 3 times per day  . nystatin   Topical BID  . sodium bicarbonate  1,300 mg Oral BID  . sodium chloride  3 mL Intravenous Q12H    Time spent on care of this patient: 30 mins   Frenchie Pribyl A , MD [phone]  If 7PM-7AM, please contact night-coverage [email or web address] Password [email or web address] 05/12/2014, 10:34 AM   LOS: 11 days

## 2014-05-12 NOTE — Consult Note (Signed)
Reason for consult: Tunneled hemodialysis catheter placement Referring Physician(s): Dr. Pearson Grippe  History of Present Illness: Curtis Brock is a 81 y.o. male with past medical history significant for diabetes, blind in left eye, prior CVA, hyperlipidemia, admitted on 05/01/14 with increased lethargy, confusion, tachypnea , hypotension, hypothermia, anuria and acute renal failure. Patient was evaluated by nephrology and initially had a femoral dialysis catheter placed which was removed on 4/26. Patient also had a right non-tunneled internal jugular catheter placed which was inadvertently removed by the patient on 4/27. Patient's creatinine continues to be elevated ,currently at 9.97 with potassium of 4.6.[name] Desire to pursue hemodialysis was discussed in detail with the family by nephrology and they wish to proceed with tunneled hemodialysis catheter placement today. Order has been received for the above procedure.  Past Medical History  Diagnosis Date  . Diabetes mellitus without complication   . Hypertension   . Blind left eye     History reviewed. No pertinent past surgical history.  Allergies: Review of patient's allergies indicates no known allergies.  Medications: Prior to Admission medications   Medication Sig Start Date End Date Taking? Authorizing Provider  acetaminophen (TYLENOL) 325 MG tablet Take 2 tablets (650 mg total) by mouth every 6 (six) hours as needed for mild pain or moderate pain (or Fever >/= 101). 04/20/13  Yes Sheila Oats, MD  aspirin EC 81 MG tablet Take 81 mg by mouth daily.   Yes Historical Provider, MD  brimonidine (ALPHAGAN) 0.2 % ophthalmic solution Place 1 drop into the right eye 2 (two) times daily. 03/28/14  Yes Historical Provider, MD  cyanocobalamin (,VITAMIN B-12,) 1000 MCG/ML injection INJECT 1 ML AS DIRECTED EVERY MONTH 04/05/13  Yes Darlyne Russian, MD  furosemide (LASIX) 20 MG tablet TAKE 1 TABLET (20 MG TOTAL) BY MOUTH DAILY. 03/30/14  Yes  Darlyne Russian, MD  losartan (COZAAR) 50 MG tablet Take 1 tablet (50 mg total) by mouth daily. 03/30/14  Yes Darlyne Russian, MD     History reviewed. No pertinent family history.  History   Social History  . Marital Status: Widowed    Spouse Name: N/A  . Number of Children: N/A  . Years of Education: N/A   Social History Main Topics  . Smoking status: Never Smoker   . Smokeless tobacco: Not on file  . Alcohol Use: No  . Drug Use: No  . Sexual Activity: No   Other Topics Concern  . None   Social History Narrative     Review of Systems patient currently denies fever, chest pain, dyspnea, abdominal or back pain, nausea, vomiting or bleeding  Vital Signs: BP 146/68 mmHg  Pulse 92  Temp(Src) 97.7 F (36.5 C) (Oral)  Resp 14  Ht 5\' 6"  (1.676 m)  Wt 268 lb (121.564 kg)  BMI 43.28 kg/m2  SpO2 100%  Physical Exam patient awake but does not open eyes; follows commands and answers questions appropriately. Chest to auscultation bilaterally. Heart with regular rate and rhythm. Abdomen obese, positive bowel sounds, nontender. Extremities with 1+ edema bilaterally  Mallampati Score:     Imaging: Ct Abdomen Pelvis Wo Contrast  05/02/2014   CLINICAL DATA:  Weakness, lethargy, hypothermia, BILATERAL lower extremity erythema, elevated LFTs, evaluation for source of sepsis  EXAM: CT ABDOMEN AND PELVIS WITHOUT CONTRAST  TECHNIQUE: Multidetector CT imaging of the abdomen and pelvis was performed following the standard protocol without IV contrast. Sagittal and coronal MPR images reconstructed from axial data  set. Patient drank dilute oral contrast for exam.  COMPARISON:  04/17/2013  FINDINGS: Minimal RIGHT pleural effusion and RIGHT basilar atelectasis.  Within limits of a nonenhanced exam no focal abnormalities of the liver, spleen, pancreas, kidneys, or adrenal glands.  Calcified gallstone within gallbladder 19 mm greatest size.  No hydronephrosis or ureteral dilatation.  Scattered  atherosclerotic calcifications.  Foley catheter decompresses urinary bladder.  Umbilical hernia containing fat.  Sigmoid diverticulosis without evidence of diverticulitis.  Stomach and bowel loops otherwise normal appearance.  No mass, adenopathy, free fluid or free air.  Normal appendix.  Osseous demineralization with scattered degenerative changes of the thoracolumbar spine.  IMPRESSION: No acute intra-abdominal or intrapelvic abnormalities.  Minimal RIGHT pleural effusion and basilar atelectasis.  Cholelithiasis with otherwise unremarkable gallbladder.  Sigmoid diverticulosis.  Umbilical hernia containing fat.   Electronically Signed   By: Lavonia Dana M.D.   On: 05/02/2014 14:00   Dg Chest 2 View  05/01/2014   CLINICAL DATA:  Per EPIC chart notes, pt brought by EMS due to family-observed mental status change, hypothermic, at time of imaging, pt seems disoriented, unable to communicate, unable to follow directions for imaging, Held by tech for imaging, pt unwilling to raise arms for lateral view  EXAM: CHEST  2 VIEW  COMPARISON:  02/05/2014  FINDINGS: Cardiac silhouette normal in size and configuration. Normal mediastinal and hilar contours.  Clear lungs.  No pleural effusion or pneumothorax.  Bony thorax is demineralized but grossly intact.  IMPRESSION: No acute cardiopulmonary disease.   Electronically Signed   By: Lajean Manes M.D.   On: 05/01/2014 07:26   Dg Abd 1 View  05/12/2014   CLINICAL DATA:  Constipation.  EXAM: ABDOMEN - 1 VIEW  COMPARISON:  CT scan dated 05/02/2014  FINDINGS: The bowel gas pattern is normal with no evidence of excessive stool in the colon. No fecal impaction.  Chronic degenerative changes in the lumbar spine. No abnormal abdominal calcifications.  IMPRESSION: Benign appearing abdomen.  No excessive stool visible in the colon.   Electronically Signed   By: Lorriane Shire M.D.   On: 05/12/2014 11:22   Ct Head Wo Contrast  05/01/2014   CLINICAL DATA:  Behavioral changes.  EXAM:  CT HEAD WITHOUT CONTRAST  TECHNIQUE: Contiguous axial images were obtained from the base of the skull through the vertex without intravenous contrast.  COMPARISON:  02/05/2014 and 04/18/2013.  FINDINGS: Ventricles are normal in configuration. There is ventricular and sulcal enlargement reflecting moderate atrophy. No hydrocephalus.  There are no parenchymal masses or mass effect. There are small old lacune infarcts the right base a ganglia and left periventricular white matter. Patchy areas of white matter hypoattenuation are noted more diffusely consistent with moderate chronic microvascular ischemic change. There is no evidence of a recent cortical infarct.  There are no extra-axial masses or abnormal fluid collections.  There is no intracranial hemorrhage.  Visualized sinuses and mastoid air cells are clear.  IMPRESSION: 1. No acute intracranial abnormalities. 2. Moderate atrophy and chronic microvascular ischemic change. Small old lacune infarcts.   Electronically Signed   By: Lajean Manes M.D.   On: 05/01/2014 07:09   Dg Chest Port 1 View  05/10/2014   CLINICAL DATA:  Central line placement  EXAM: PORTABLE CHEST - 1 VIEW  COMPARISON:  05/01/2014  FINDINGS: New right IJ central line, a dialysis catheter. The tip is at the lower SVC.  No cardiomegaly. Aortic and hilar contours are stable and there is no new mediastinal  widening. No pneumothorax. A trace right pleural effusion is present over the elevated right diaphragm, chronic based on CT 05/02/2014.  IMPRESSION: New right IJ dialysis catheter, tip at the SVC level. No pneumothorax.   Electronically Signed   By: Monte Fantasia M.D.   On: 05/10/2014 16:26   US Abdomen Limited Ruq  05/01/2014   CLINICAL DATA:  Elevated liver function tests.  Hypertension.  EXAM: US ABDOMEN LIMITED - RIGHT UPPER QUADRANT  COMPARISON:  None.  FINDINGS: Gallbladder:  1.0 cm gallstone lodged in the gallbladder neck. The remainder the gallbladder was poorly visualized due to  overlying bowel gas. No gross additional calculated, wall thickening or pericholecystic fluid. Assessment for tenderness cannot be determined due to the fact that the patient is unresponsive.  Common bile duct:  Diameter: 2.5 mm  Liver:  No focal lesion identified. Within normal limits in parenchymal echogenicity.  IMPRESSION: 1. Limited examination due to overlying bowel gas and inability of the patient to cooperate. 2. 1.0 cm gallstone lodged in the gallbladder neck with no evidence of cholecystitis. Mid   Electronically Signed   By: Claudie Revering M.D.   On: 05/01/2014 14:11    Labs:  CBC:  Recent Labs  05/05/14 0512 05/07/14 0302 05/09/14 0606 05/11/14 1058  WBC 6.5 6.7 6.9 9.5  HGB 9.8* 9.5* 8.7* 8.7*  HCT 28.3* 27.4* 24.9* 25.8*  PLT 197 188 208 239    COAGS: No results for input(s): INR, APTT in the last 8760 hours.  BMP:  Recent Labs  05/09/14 0606 05/10/14 0739 05/11/14 1058 05/12/14 0530  NA 139 141 140 138  K 4.2 4.4 4.7 4.6  CL 102 105 105 101  CO2 22 24 23 23   GLUCOSE 129* 97 124* 126*  BUN 70* 80* 91* 97*  CALCIUM 7.6* 7.9* 8.2* 8.2*  CREATININE 7.53* 8.34* 8.89* 9.97*  GFRNONAA 6* 5* 5* 4*  GFRAA 7* 6* 5* 5*    LIVER FUNCTION TESTS:  Recent Labs  05/07/14 0302 05/08/14 0321 05/09/14 0606 05/10/14 0739 05/11/14 1058 05/12/14 0530  BILITOT 0.6 0.5 0.6 0.6  --   --   AST 102* 73* 73* 65*  --   --   ALT 114* 103* 103* 93*  --   --   ALKPHOS 60 59 60 61  --   --   PROT 5.1* 4.8* 4.8* 4.7*  --   --   ALBUMIN 2.0*  2.0* 1.9* 2.0* 1.9* 2.0* 2.1*    TUMOR MARKERS: No results for input(s): AFPTM, CEA, CA199, CHROMGRNA in the last 8760 hours.  Assessment and Plan: Curtis Brock is a 81 y.o. male with past medical history significant for diabetes, blind in left eye, prior CVA, hyperlipidemia, admitted on 05/01/14 with increased lethargy, confusion, tachypnea , hypotension, hypothermia, anuria and acute renal failure. Patient was evaluated by nephrology  and initially had a femoral dialysis catheter placed which was removed on 4/26. Patient also had a right non-tunneled internal jugular catheter placed which was inadvertently removed by the patient on 4/27. Patient's creatinine continues to be elevated ,currently at 9.97 with potassium of 4.6.[name] Desire to pursue hemodialysis was discussed in detail with the family by nephrology and they wish to proceed with tunneled hemodialysis catheter placement today. Order has been received for the above procedure. Details/risks of procedure, including but not limited to, internal bleeding, infection, discussed with patient/patient's daughter with their understanding and consent.  Procedure is tentatively planned for later today.     Signed: [id number]  KEVIN 05/12/2014, 12:30 PM   I spent a total of 20 minutes in face to face in clinical consultation, greater than 50% of which was counseling/coordinating care for tunneled hemodialysis catheter placement

## 2014-05-12 NOTE — Progress Notes (Signed)
  [facility] Progress Note   Subjective:  Met with daughter Cherene Julian who has spoken with family; they all wish to pursue HD at this time with plan to see how doing after 1-2 Tx No evidenc of GFR recovery   Filed Vitals:   05/11/14 1707 05/11/14 2000 05/12/14 0523 05/12/14 0815  BP: 116/69 121/30 133/61 146/68  Pulse: 94 100 99 92  Temp: 97.9 F (36.6 C) 97.6 F (36.4 C) 97.8 F (36.6 C) 97.7 F (36.5 C)  TempSrc: Oral Oral Oral Oral  Resp: _0 Height:      Weight:  121.564 kg (268 lb)    SpO2: 100% 100% 98% 100%   Exam: Elderly AAM, no distress, frail,  Chest clear bilat RRR no MRG Abd obese, NTND, no mass or ascites, no HSM 1+ LE edema bilat Neuro alert, responsive, no asterixis, weak  Last creat 1.01 in Jan 2016   Assessment: 1. AKI shock/ ARB  1. HD #1 4/24, HD post catheter toda 2. See family conversation in Note on 4/27 3. Cont to follow closely for recovery, update RP today 2. Shock/ low BP resolved, w/u neg abx stopped 3. HTN home meds on hold 4. Blind L eye 5. DM 6. Hx falls / debility     Pearson Grippe, MD  05/12/2014, 10:07 AM     Recent Labs Lab 05/07/14 0302  05/10/14 0739 05/11/14 1058 05/12/14 0530  NA 139  138  < > 141 140 138  K 4.9  4.9  < > 4.4 4.7 4.6  CL 106  105  < > 105 105 101  CO2 17*  17*  < > _1 GLUCOSE 129*  128*  < > 97 124* 126*  BUN 95*  95*  < > 80* 91* 97*  CREATININE 8.89*  8.79*  < > 8.34* 8.89* 9.97*  CALCIUM 8.2*  8.1*  < > 7.9* 8.2* 8.2*  PHOS 9.0*  --   --  8.8* 9.2*  < > = values in this interval not displayed.  Recent Labs Lab 05/08/14 0321 05/09/14 0606 05/10/14 0739 05/11/14 1058 05/12/14 0530  AST 73* 73* 65*  --   --   ALT 103* 103* 93*  --   --   ALKPHOS 59 60 61  --   --   BILITOT 0.5 0.6 0.6  --   --   PROT 4.8* 4.8* 4.7*  --   --   ALBUMIN 1.9* 2.0* 1.9* 2.0* 2.1*    Recent Labs Lab 05/07/14 0302 05/09/14 0606 05/11/14 1058  WBC 6.7 6.9 9.5   NEUTROABS  --  4.2  --   HGB 9.5* 8.7* 8.7*  HCT 27.4* 24.9* 25.8*  MCV 76.8* 77.8* 78.2  PLT 188 208 239   . aspirin EC  81 mg Oral Daily  . brimonidine  1 drop Right Eye BID  . docusate sodium  100 mg Oral BID  . feeding supplement (NEPRO CARB STEADY)  237 mL Oral BID  . feeding supplement (PRO-STAT SUGAR FREE 64)  30 mL Oral Daily  . heparin  5,000 Units Subcutaneous 3 times per day  . nystatin   Topical BID  . sodium bicarbonate  1,300 mg Oral BID  . sodium chloride  3 mL Intravenous Q12H     bisacodyl, hydrALAZINE, ondansetron **OR** ondansetron (ZOFRAN) IV, senna-docusate, traMADol

## 2014-05-13 ENCOUNTER — Inpatient Hospital Stay (HOSPITAL_COMMUNITY): Payer: Medicare Other

## 2014-05-13 LAB — BASIC METABOLIC PANEL
Anion gap: 12 (ref 5–15)
BUN: 66 mg/dL — AB (ref 6–23)
CO2: 25 mmol/L (ref 19–32)
Calcium: 7.9 mg/dL — ABNORMAL LOW (ref 8.4–10.5)
Chloride: 101 mmol/L (ref 96–112)
Creatinine, Ser: 7.04 mg/dL — ABNORMAL HIGH (ref 0.50–1.35)
GFR, EST AFRICAN AMERICAN: 7 mL/min — AB (ref >90)
GFR, EST NON AFRICAN AMERICAN: 6 mL/min — AB (ref >90)
Glucose, Bld: 106 mg/dL — ABNORMAL HIGH (ref 70–99)
Potassium: 3.9 mmol/L (ref 3.5–5.1)
SODIUM: 138 mmol/L (ref 135–145)

## 2014-05-13 LAB — CBC
HCT: 22.4 % — ABNORMAL LOW (ref 39.0–52.0)
HEMOGLOBIN: 7.5 g/dL — AB (ref 13.0–17.0)
MCH: 26.4 pg (ref 26.0–34.0)
MCHC: 33.5 g/dL (ref 30.0–36.0)
MCV: 78.9 fL (ref 78.0–100.0)
Platelets: 214 10*3/uL (ref 150–400)
RBC: 2.84 MIL/uL — AB (ref 4.22–5.81)
RDW: 14.2 % (ref 11.5–15.5)
WBC: 9.9 10*3/uL (ref 4.0–10.5)

## 2014-05-13 LAB — GLUCOSE, CAPILLARY
GLUCOSE-CAPILLARY: 170 mg/dL — AB (ref 70–99)
Glucose-Capillary: 104 mg/dL — ABNORMAL HIGH (ref 70–99)
Glucose-Capillary: 113 mg/dL — ABNORMAL HIGH (ref 70–99)
Glucose-Capillary: 117 mg/dL — ABNORMAL HIGH (ref 70–99)
Glucose-Capillary: 92 mg/dL (ref 70–99)

## 2014-05-13 LAB — MAGNESIUM: Magnesium: 2.1 mg/dL (ref 1.5–2.5)

## 2014-05-13 MED ORDER — ACETAMINOPHEN 650 MG RE SUPP
650.0000 mg | RECTAL | Status: DC | PRN
  Filled 2014-05-13: qty 1

## 2014-05-13 MED ORDER — AMOXICILLIN-POT CLAVULANATE 400-57 MG/5ML PO SUSR
500.0000 mg | ORAL | Status: AC
  Administered 2014-05-13 – 2014-05-20 (×8): 500 mg via ORAL
  Filled 2014-05-13 (×10): qty 6.3

## 2014-05-13 MED ORDER — HEPARIN SODIUM (PORCINE) 1000 UNIT/ML DIALYSIS: 20.0000 [IU]/kg | INTRAMUSCULAR | Status: DC | PRN

## 2014-05-13 NOTE — Progress Notes (Signed)
Progress Note  Curtis Brock [name] DOB: 31-Aug-1925 DOA: 05/01/2014 PCP: Jenny Reichmann, MD  Admit HPI / Brief Narrative: 81 y.o. 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 In ED workup was significant for acute renal failure with a creatinine of 2, hypothermia with temperature of 92.4, urinalysis was negative, no acute finding on chest x-ray, blood cultures were sent, CT head with no acute findings.   Patient's crt continued to increase after admission to [facility], he remained anuric despite being +7000 mL positive, and was therefore transferred to [facility] for possible need of hemodialysis in near future. Has so far received 1 HD treatment (4/24) and renal is discussing/debating regarding future treatment given patient overall health status and life quality declined.  HPI/Subjective: Cough better.  He was coughing a lot while eating or trying to swallow pills.  He feels better today than yesterday.  He is more alert today. Still sleepy   Assessment/Plan:  Acute renal failure / anuric. -Nephrology following.  -patient initiated on temporary HD treatment -Patient remove/pull  HD catheter 4-26. -Family wants to try dialysis for now. Plan for HD catheter placement.   Constipation;  KUB negative. Received dulcolax suppository   Cough; started guaifenesin. Check chest x ray. Concern for aspiration.   Dysphagia; worsening dysphagia.  Speech consulted.  Check MRI.   SIRS -Patient meets SIRS criteria on admission given his hypothermia, tachypnea; but not findings supporting sepsis -resolved.  -No convincing radiographic or clinical evidence of infection; will continue monitoring off antibiotics for now  Elevated LFTs -ultrasound showing 1 cm gallstone lodged in the gallbladder neck with no evidence of cholecystitis, CT abdomen pelvis without contrast with no acute finding. -No  abdominal pain.  -LFTs continuing to trend down -will monitor for now  Pressure ulcers/sacral decubitus ulcer -Wound care following -continue following preventive measures -on air mattress   Acute encephalopathy -most lekly associated with uremia - ammonia level at 21.   History of CVA -Continue aspirin 81 mg daily for secondary prevention -no new deficit -worsening dysphagia. Will check MRI.   Fungal rash in groin and abdominal fold areas - Treated with Diflucan, continue with nystatin powder and keep area clean and dry  Essential hypertension -Continue to hold lasix and Cozaar in setting of renal failure.  -Will order PRN Hydralazine.   Diabetes mellitus Type 2 controlled -Does not take any home medication, CBGs are controlled without insulin. -4/22 Hemoglobin A1c=6.5 -Lipid panel; LDL slightly elevated by 88 guidelines and HDL low will hold on adding additional medication at this time. Also patient with elevated LFT>   Hyperkalemia -Kayexalate 60 gm  2 doses -will monitor  Metabolic acidosis -will discontinue sodium bicarb.  -renal on board, will follow rec's  Back pain   -will continue PRN pain medications (Tramadol 50 mg QID PRN) -Unable to treat pain with NSAIDs or Tylenol secondary patient's renal and liver failure  Hypothermic -resolved  Code Status: DNR Family Communication: daughter at bedside.  Disposition Plan: SNF  Consultants: Dr.Robert Doctor, hospital (nephrology) Dr. Rhea Pink (palliative care)  Procedure/Significant Events: 4/17 CT head without contrast;-no acute findings-small old lacune infarcts the right base a ganglia and left periventricular white matter.  4/17 ultrasound RUQ;1.0 cm gallstone lodged in the gallbladder neck with no evidence of cholecystitis.  4/18 CT abdomen pelvis without contrast;No acute intra-abdominal or intrapelvic abnormalities. -Minimal RIGHT pleural effusion and basilar atelectasis. -Cholelithiasis with otherwise  unremarkable  gallbladder.-Sigmoid diverticulosis. -Umbilical hernia containing fat. 4/18 echocardiogram;- Left ventricle: suggest that overall EF is probably normal. -(grade 1diastolic dysfunction). - Pulmonary arteries: PA peak pressure: 39 mm Hg (S).  Culture No growth appreciated in any culture.  Antibiotics: Zosyn 4/17 > >stopped 4/20 Vancomycin>> stopped4/17   DVT prophylaxis: Subcutaneous heparin  LINES / TUBES:       Objective: VITAL SIGNS: Temp: 98.3 F (36.8 C) (04/29 0919) Temp Source: Oral (04/29 0919) BP: 141/56 mmHg (04/29 0919) Pulse Rate: 88 (04/29 0919) SPO2; FIO2:   Intake/Output Summary (Last 24 hours) at 05/13/14 1000 Last data filed at 05/13/14 0945  Gross per 24 hour  Intake      0 ml  Output    900 ml  Net   -900 ml     Exam: General: No acute respiratory distress. Sleepy but wake up and answer questions.  Lungs: bilateral ronchus.  Cardiovascular: Regular rate and rhythm, without murmur, gallops or rub normal S1 and S2 Abdomen: Nontender, nondistended, soft, bowel sounds positive, no rebound,  Extremities: No significant cyanosis, clubbing. Bilateral +1 pitting edema to knees.    Data Reviewed: Basic Metabolic Panel:  Recent Labs Lab 05/07/14 0302 05/08/14 0321 05/09/14 0606 05/10/14 0739 05/11/14 1040 05/11/14 1058 05/12/14 0530  NA 139  138 135 139 141  --  140 138  K 4.9  4.9 5.2* 4.2 4.4  --  4.7 4.6  CL 106  105 103 102 105  --  105 101  CO2 17*  17* 19 22 24   --  23 23  GLUCOSE 129*  128* 168* 129* 97  --  124* 126*  BUN 95*  95* 103* 70* 80*  --  91* 97*  CREATININE 8.89*  8.79* 9.14* 7.53* 8.34*  --  8.89* 9.97*  CALCIUM 8.2*  8.1* 7.8* 7.6* 7.9*  --  8.2* 8.2*  MG 2.1 2.1 2.0 1.9 2.0  --  2.0  PHOS 9.0*  --   --   --   --  8.8* 9.2*   Liver Function Tests:  Recent Labs Lab 05/07/14 0302 05/08/14 0321 05/09/14 0606 05/10/14 0739 05/11/14 1058 05/12/14 0530  AST 102* 73* 73* 65*  --   --   ALT  114* 103* 103* 93*  --   --   ALKPHOS 60 59 60 61  --   --   BILITOT 0.6 0.5 0.6 0.6  --   --   PROT 5.1* 4.8* 4.8* 4.7*  --   --   ALBUMIN 2.0*  2.0* 1.9* 2.0* 1.9* 2.0* 2.1*    Recent Labs Lab 05/11/14 1345  AMMONIA 21   CBC:  Recent Labs Lab 05/07/14 0302 05/09/14 0606 05/11/14 1058  WBC 6.7 6.9 9.5  NEUTROABS  --  4.2  --   HGB 9.5* 8.7* 8.7*  HCT 27.4* 24.9* 25.8*  MCV 76.8* 77.8* 78.2  PLT 188 208 239   CBG:  Recent Labs Lab 05/12/14 0756 05/12/14 1123 05/12/14 1755 05/13/14 0018 05/13/14 0806  GLUCAP 133* 137* 112* 113* 117*    No results found for this or any previous visit (from the past 240 hour(s)).   Studies:  Recent x-ray studies have been reviewed in detail by the Attending Physician  Scheduled Meds:  Scheduled Meds: . aspirin EC  81 mg Oral Daily  . bisacodyl  10 mg Rectal Once  . brimonidine  1 drop Right Eye BID  . dextromethorphan-guaiFENesin  1 tablet Oral BID  . docusate sodium  100  mg Oral BID  . feeding supplement (NEPRO CARB STEADY)  237 mL Oral BID  . feeding supplement (PRO-STAT SUGAR FREE 64)  30 mL Oral Daily  . heparin  5,000 Units Subcutaneous 3 times per day  . nystatin   Topical BID  . sodium bicarbonate  1,300 mg Oral BID  . sodium chloride  3 mL Intravenous Q12H    Time spent on care of this patient: 30 mins   Taila Basinski A , MD [phone]  If 7PM-7AM, please contact night-coverage [email or web address] Password [email or web address] 05/13/2014, 10:00 AM   LOS: 12 days

## 2014-05-13 NOTE — Progress Notes (Signed)
[facility] Progress Note   Subjective:  Tunneled catheter placed for dialysis yesterday Received hemodialysis yesterday evening, 3 hours, 0.5L L UF Awake this AM, in bed Son at bedside, updated.   Filed Vitals:   05/12/14 2350 05/13/14 0015 05/13/14 0421 05/13/14 0919  BP: 98/50 127/45 146/60 141/56  Pulse: 90 92 93 88  Temp:  97.9 F (36.6 C) 98.8 F (37.1 C) 98.3 F (36.8 C)  TempSrc:  Oral Oral Oral  Resp: 16 17 16 17   Height:      Weight:  118.9 kg (262 lb 2 oz)    SpO2:  99% 99% 100%   Exam: Elderly AAM, no distress, frail,  Chest clear bilat RRR no MRG Abd obese, NTND, no mass or ascites, no HSM 1+ LE edema bilat Neuro alert, responsive, no asterixis, weak SKIN: R IJ TDC bandaged     Assessment: 1. AKI shock/ ARB  1. Last creat 1.01 in Jan 2016 2. HD #1 4/24, #2 4/28 3. Dialysis initiated as a temporary measure to try to facilitate functional improvements, if ineffective the patient was very clear that he never wish to receive long-term hemodialysis. 4. See family conversation in Note on 4/27 5. Cont to follow closely for recovery,  6. Tentative next dialysis on 05/14/14. 2. Shock/ low BP resolved, w/u neg abx stopped 3. HTN home meds on hold 4. Blind L eye 5. DM 6. Hx falls / debility     Pearson Grippe, MD  05/13/2014, 12:20 PM     Recent Labs Lab 05/07/14 0302  05/10/14 0739 05/11/14 1058 05/12/14 0530  NA 139  138  < > 141 140 138  K 4.9  4.9  < > 4.4 4.7 4.6  CL 106  105  < > 105 105 101  CO2 17*  17*  < > 24 23 23   GLUCOSE 129*  128*  < > 97 124* 126*  BUN 95*  95*  < > 80* 91* 97*  CREATININE 8.89*  8.79*  < > 8.34* 8.89* 9.97*  CALCIUM 8.2*  8.1*  < > 7.9* 8.2* 8.2*  PHOS 9.0*  --   --  8.8* 9.2*  < > = values in this interval not displayed.  Recent Labs Lab 05/08/14 0321 05/09/14 0606 05/10/14 0739 05/11/14 1058 05/12/14 0530  AST 73* 73* 65*  --   --   ALT 103* 103* 93*  --   --   ALKPHOS 59 60 61   --   --   BILITOT 0.5 0.6 0.6  --   --   PROT 4.8* 4.8* 4.7*  --   --   ALBUMIN 1.9* 2.0* 1.9* 2.0* 2.1*    Recent Labs Lab 05/07/14 0302 05/09/14 0606 05/11/14 1058  WBC 6.7 6.9 9.5  NEUTROABS  --  4.2  --   HGB 9.5* 8.7* 8.7*  HCT 27.4* 24.9* 25.8*  MCV 76.8* 77.8* 78.2  PLT 188 208 239   . aspirin EC  81 mg Oral Daily  . bisacodyl  10 mg Rectal Once  . brimonidine  1 drop Right Eye BID  . dextromethorphan-guaiFENesin  1 tablet Oral BID  . docusate sodium  100 mg Oral BID  . feeding supplement (NEPRO CARB STEADY)  237 mL Oral BID  . feeding supplement (PRO-STAT SUGAR FREE 64)  30 mL Oral Daily  . heparin  5,000 Units Subcutaneous 3 times per day  . nystatin   Topical BID  . sodium bicarbonate  1,300 mg  Oral BID  . sodium chloride  3 mL Intravenous Q12H     acetaminophen, bisacodyl, heparin, hydrALAZINE, ondansetron **OR** ondansetron (ZOFRAN) IV, senna-docusate, sorbitol, traMADol

## 2014-05-13 NOTE — Evaluation (Addendum)
Clinical/Bedside Swallow Evaluation Patient Details  Name: Curtis Brock MRN: [id number] Date of Birth: 1925/01/26  Today's Date: 05/13/2014 Time: SLP Start Time (ACUTE ONLY): 1202 SLP Stop Time (ACUTE ONLY): 1250 SLP Time Calculation (min) (ACUTE ONLY): 48 min  Past Medical History:  Past Medical History  Diagnosis Date  . Diabetes mellitus without complication   . Hypertension   . Blind left eye    Past Surgical History: History reviewed. No pertinent past surgical history. HPI:  81 y.o. male, with past medical history of diabetes mellitus, CVA, hyperlipidemia, altered mental status, shunt admitted with increased lethargy and confusion than baseline. Pt found to be in acute renal failure. CT head with no acute findings. CXR No acute cardiopulmonary disease. MBS 06/17/12 Mild oral phase dysphagia;Mild pharyngeal phase dysphagia;Mild cervical esophageal phase dysphagia; cervical osteophytes at C2-C3, C3-C4, C4-C5, C5-C6 mildly impairing barium flow into esophagus (C5-C6 mostly impacting swallow). Dys 3 and thin liquids recommended.   Assessment / Plan / Recommendation Clinical Impression  Pt presents with ongoing symptoms of suspected chronic dysphagia characterized by delayed oral transiting, suspected delayed pharyngeal swallow and delayed coughing.  Pt has baseline structural dysphagia per [facility] 06/2012.  Initially pt tolerated po well without clinical indications of airway compromise.  However after consumption of approximately 4 ounces applesauce, 4 ounces juice and 2 small crackers, pt began coughing.    Pt then reported h/o coughing after intake at home prior to admission for at least the last few months. Much better tolerance of thin via tsp noted.    Reviewed options with pt/son Hollice Espy and MD of restarting diet with strict compensation strategy usage with understanding SLP can not warranty pt is not aspirating or MBS. Pt expressed desire for diet to be re-started with compensation  strategies to determine tolerance.    Discussed with MD SLP suspicion of chronic dysphagia and episodic aspiration with current exacerbation due to medical issue.  ? if pt's constipation worsening issue as well.  Will restart diet per pt desire and follow up for tolerance and if pt/MD/son desire for MBS after MRI/CXR and/or based on diet tolerance.     Note lung sounds clear and CXR not completed yet.  Pt for MRI today due to his dysphagia.        Aspiration Risk  Severe    Diet Recommendation Dysphagia 2 (Fine chop);Thin liquid   Liquid Administration via: Spoon Medication Administration: Whole meds with puree Supervision: Full supervision/cueing for compensatory strategies;Staff to assist with self feeding Compensations: Slow rate;Small sips/bites;Follow solids with liquid;Multiple dry swallows after each bite/sip Postural Changes and/or Swallow Maneuvers: Seated upright 90 degrees    Other  Recommendations Oral Care Recommendations: Oral care BID   Follow Up Recommendations  None    Frequency and Duration min 2x/week  2 weeks   Pertinent Vitals/Pain Afebrile, decresaed      Swallow Study Prior Functional Status   see [place] Date of Onset: 05/13/14 HPI: 81 y.o. male, with past medical history of diabetes mellitus, CVA, hyperlipidemia, altered mental status, shunt admitted with increased lethargy and confusion than baseline. Pt found to be in acute renal failure. CT head with no acute findings. CXR No acute cardiopulmonary disease. MBS 06/17/12 Mild oral phase dysphagia;Mild pharyngeal phase dysphagia;Mild cervical esophageal phase dysphagia; cervical osteophytes at C2-C3, C3-C4, C4-C5, C5-C6 mildly impairing barium flow into esophagus (C5-C6 mostly impacting swallow). Dys 3 and thin liquids recommended. Type of Study: Bedside swallow evaluation    Oral/Motor/Sensory Function Overall  Oral Motor/Sensory Function: Appears within functional limits for tasks assessed   Ice  Chips Ice chips: Not tested   Thin Liquid Thin Liquid: Impaired Presentation: Cup;Straw Oral Phase Impairments: Reduced labial seal;Reduced lingual movement/coordination Oral Phase Functional Implications: Prolonged oral transit Pharyngeal  Phase Impairments: Suspected delayed Swallow;Cough - Immediate;Throat Clearing - Delayed;Cough - Delayed (audible/sluggish swallow quality)    Nectar Thick Nectar Thick Liquid: Impaired Presentation: Cup Oral Phase Impairments: Reduced labial seal;Impaired anterior to posterior transit;Reduced lingual movement/coordination Pharyngeal Phase Impairments: Suspected delayed Swallow   Honey Thick Honey Thick Liquid: Not tested   Puree Puree: Impaired Presentation: Spoon Oral Phase Impairments: Impaired anterior to posterior transit Oral Phase Functional Implications: Prolonged oral transit Pharyngeal Phase Impairments: Suspected delayed Swallow   Solid   GO    Solid: Impaired Presentation: Spoon Oral Phase Impairments: Reduced lingual movement/coordination;Impaired anterior to posterior transit Oral Phase Functional Implications: Other (comment) (prolonged transiting) Pharyngeal Phase Impairments: Suspected delayed Swallow;Cough - Delayed       Luanna Salk, [facility] SLP [phone]

## 2014-05-13 NOTE — Clinical Social Work Note (Signed)
Covering CSW continuing to follow and assist with discharge planning as needed.  Lubertha Sayres, [place] Cell: [phone]       Fax: [phone] Clinical Social Work: Orthopedics [phone]) and Surgical [phone])

## 2014-05-13 NOTE — Care Management (Signed)
CARE MANAGEMENT NOTE 05/13/2014  Patient:  Curtis Brock, Curtis Brock   Account Number:  [id number]  Date Initiated:  05/02/2014  Documentation initiated by:  DAVIS,RHONDA  Subjective/Objective Assessment:   svt,poss. sepsis, low urinary outpt, low diasytolic pressures, abnormal ekg and elevated troponins.     Action/Plan:   tbd  05/10/2014 CM to room, pt sleeping no family present. Noted Pall consult ordered.  05/13/14 HD x2, MRI pending.   Anticipated DC Date:  05/18/2014   Anticipated DC Plan:  SKILLED NURSING FACILITY  In-house referral  NA      DC Planning Services  CM consult      Choice offered to / List presented to:             Status of service:  In process, will continue to follow Medicare Important Message given?  YES (If response is "NO", the following Medicare IM given date fields will be blank) Date Medicare IM given:  05/04/2014 Medicare IM given by:  MAYO,HENRIETTA Date Additional Medicare IM given:  05/13/2014 Additional Medicare IM given by:  [facility]  Discharge Disposition:    Per UR Regulation:  Reviewed for med. necessity/level of care/duration of stay  If discussed at [place] of Stay Meetings, dates discussed:    Comments:  05/14/14 HD x 2, still eval for ongong HD.  CRoyal RN MPH, case manager, [phone]  05/10/2014 CM following for progression, pt has been to HD x1 with increasing alertness following treatment, family to meet with Pall care to discuss plan of care. Pt currently not ambulatory.  CRoyal RN MPH, [phone]  05/04/14 1041 Nikolai RN MSN BSN CCM Pt transferred to [facility] 2/2 worsening renal function/potential need for HD.  Lives with dtr and her family, has cane, walker, and BSC.  [place], [place], BSN, [place] [phone] Chart Reviewed. Discharge needs at time of review: None present will follow for needs.

## 2014-05-14 LAB — BASIC METABOLIC PANEL
ANION GAP: 11 (ref 5–15)
BUN: 77 mg/dL — ABNORMAL HIGH (ref 6–23)
CALCIUM: 7.7 mg/dL — AB (ref 8.4–10.5)
CO2: 26 mmol/L (ref 19–32)
CREATININE: 7.98 mg/dL — AB (ref 0.50–1.35)
Chloride: 101 mmol/L (ref 96–112)
GFR calc Af Amer: 6 mL/min — ABNORMAL LOW (ref >90)
GFR, EST NON AFRICAN AMERICAN: 5 mL/min — AB (ref >90)
GLUCOSE: 103 mg/dL — AB (ref 70–99)
Potassium: 4.1 mmol/L (ref 3.5–5.1)
Sodium: 138 mmol/L (ref 135–145)

## 2014-05-14 LAB — CBC
HCT: 20.8 % — ABNORMAL LOW (ref 39.0–52.0)
Hemoglobin: 7 g/dL — ABNORMAL LOW (ref 13.0–17.0)
MCH: 27.2 pg (ref 26.0–34.0)
MCHC: 33.7 g/dL (ref 30.0–36.0)
MCV: 80.9 fL (ref 78.0–100.0)
PLATELETS: 210 10*3/uL (ref 150–400)
RBC: 2.57 MIL/uL — AB (ref 4.22–5.81)
RDW: 14.5 % (ref 11.5–15.5)
WBC: 9.5 10*3/uL (ref 4.0–10.5)

## 2014-05-14 LAB — IRON AND TIBC
Iron: 23 ug/dL — ABNORMAL LOW (ref 42–165)
Saturation Ratios: 14 % — ABNORMAL LOW (ref 20–55)
TIBC: 168 ug/dL — AB (ref 215–435)
UIBC: 145 ug/dL (ref 125–400)

## 2014-05-14 LAB — GLUCOSE, CAPILLARY
GLUCOSE-CAPILLARY: 125 mg/dL — AB (ref 70–99)
GLUCOSE-CAPILLARY: 101 mg/dL — AB (ref 70–99)
Glucose-Capillary: 116 mg/dL — ABNORMAL HIGH (ref 70–99)
Glucose-Capillary: 93 mg/dL (ref 70–99)
Glucose-Capillary: 128 mg/dL — ABNORMAL HIGH (ref 70–99)

## 2014-05-14 LAB — ABO/RH: ABO/RH(D): O POS

## 2014-05-14 LAB — PREPARE RBC (CROSSMATCH)

## 2014-05-14 LAB — MAGNESIUM: Magnesium: 2.1 mg/dL (ref 1.5–2.5)

## 2014-05-14 LAB — FERRITIN: Ferritin: 353 ng/mL — ABNORMAL HIGH (ref 22–322)

## 2014-05-14 MED ORDER — SODIUM CHLORIDE 0.9 % IV SOLN: Freq: Once | INTRAVENOUS | Status: DC

## 2014-05-14 MED ORDER — RENA-VITE PO TABS
1.0000 | ORAL_TABLET | Freq: Every day | ORAL | Status: DC
  Administered 2014-05-14 – 2014-05-23 (×10): 1 via ORAL
  Filled 2014-05-14 (×11): qty 1

## 2014-05-14 MED ORDER — MUSCLE RUB 10-15 % EX CREA
TOPICAL_CREAM | CUTANEOUS | Status: DC | PRN
  Administered 2014-05-22: 1 via TOPICAL
  Administered 2014-05-24: 06:00:00 via TOPICAL
  Filled 2014-05-14 (×2): qty 85

## 2014-05-14 NOTE — Progress Notes (Signed)
Speech Language Pathology Treatment:    Patient Details Name: Curtis Brock MRN: [id number] DOB: [birth year] Today's Date: 05/14/2014 Time: 0118-0150 SLP Time Calculation (min) (ACUTE ONLY): 32 min  Assessment / Plan / Recommendation Clinical Impression  Chest x-ray indicative of left lower lobe PNA on 05-13-14, MRI with no acute abnormalities. Nursing reports difficulty with dysphagia 2 PO this am. Upon SLP arrival pt with mild oral residue noted likley from lunch meal. Oral care provided prior to tx. Pt displaying decreased oral residue, better AP transfer, and more timely swallow initiation with puree consistencies. Pt with fluctuating mentation requiring verbal cueing for sustaining attention to feeding tasks. Thin liquids via tsp continued with no overt signs or symptoms of aspiration.  Recommend diet downgrade to dysphagia 1, thin liquids via tsp only. Objective swallow study planned for next session to determine safest least restrictive diet.    HPI Other Pertinent Information: 81 y.o. male, with past medical history of diabetes mellitus, CVA, hyperlipidemia, altered mental status, shunt admitted with increased lethargy and confusion than baseline. Pt found to be in acute renal failure. CT head with no acute findings. CXR No acute cardiopulmonary disease. MBS 06/17/12 Mild oral phase dysphagia;Mild pharyngeal phase dysphagia;Mild cervical esophageal phase dysphagia; cervical osteophytes at C2-C3, C3-C4, C4-C5, C5-C6 mildly impairing barium flow into esophagus (C5-C6 mostly impacting swallow). Dys 3 and thin liquids recommended.   Pertinent Vitals Pain Assessment: No/denies pain  SLP Plan       Recommendations Diet recommendations: Dysphagia 1 (puree);Thin liquid (liquids by tsp only ) Liquids provided via: Teaspoon Medication Administration: Whole meds with puree Supervision: Full supervision/cueing for compensatory strategies;Staff to assist with self feeding Compensations: Slow rate;Small  sips/bites;Follow solids with liquid;Multiple dry swallows after each bite/sip Postural Changes and/or Swallow Maneuvers: Seated upright 90 degrees              Oral Care Recommendations: Oral care BID    GO    Arvil Chaco MA, CCC-SLP Acute Care Speech Language Pathologist    Levi Aland 05/14/2014, 2:16 PM

## 2014-05-14 NOTE — Progress Notes (Signed)
  [facility] Progress Note   Subjective:  Awake, confused this AM Does not comprehend HD very well For HD today No increase in UOP   Filed Vitals:   05/13/14 1831 05/13/14 2014 05/14/14 0407 05/14/14 0822  BP: 131/50 125/48 131/56 123/60  Pulse: 93 104 89 86  Temp: 98.2 F (36.8 C) 98.1 F (36.7 C) 98.3 F (36.8 C) 99.8 F (37.7 C)  TempSrc: Oral Oral Oral Oral  Resp: 16 16 16 16   Height:      Weight:  117.618 kg (259 lb 4.8 oz)    SpO2: 98% 97% 99% 98%   Exam: Elderly AAM, no distress, frail,  Chest clear bilat RRR no MRG Abd obese, NTND, no mass or ascites, no HSM 1+ LE edema bilat Neuro alert, responsive, no asterixis, weak SKIN: R IJ TDC bandaged  Assessment: 1. AKI shock/ ARB  1. Last creat 1.01 in Jan 2016 2. HD #1 4/24, #2 4/28 3. For HD#3 today, check RP and CBC pre HD 4. Dialysis initiated as a temporary measure to try to facilitate functional improvements, if ineffective the patient was very clear that he never wish to receive long-term hemodialysis. 5. See family conversation in Note on 4/27 6. Will try to talk with family today 7. Cont to follow closely for recovery,  2. Shock/ low BP resolved, w/u neg abx stopped 3. HTN home meds on hold 4. Blind L eye 5. DM 6. Hx falls / debility 7. AMS     Pearson Grippe, MD  05/14/2014, 9:04 AM     Recent Labs Lab 05/11/14 1058 05/12/14 0530 05/13/14 1436  NA 140 138 138  K 4.7 4.6 3.9  CL 105 101 101  CO2 23 23 25   GLUCOSE 124* 126* 106*  BUN 91* 97* 66*  CREATININE 8.89* 9.97* 7.04*  CALCIUM 8.2* 8.2* 7.9*  PHOS 8.8* 9.2*  --     Recent Labs Lab 05/08/14 0321 05/09/14 0606 05/10/14 0739 05/11/14 1058 05/12/14 0530  AST 73* 73* 65*  --   --   ALT 103* 103* 93*  --   --   ALKPHOS 59 60 61  --   --   BILITOT 0.5 0.6 0.6  --   --   PROT 4.8* 4.8* 4.7*  --   --   ALBUMIN 1.9* 2.0* 1.9* 2.0* 2.1*    Recent Labs Lab 05/09/14 0606 05/11/14 1058 05/13/14 1436  WBC 6.9 9.5  9.9  NEUTROABS 4.2  --   --   HGB 8.7* 8.7* 7.5*  HCT 24.9* 25.8* 22.4*  MCV 77.8* 78.2 78.9  PLT 208 239 214   . amoxicillin-clavulanate  500 mg Oral Q24H  . aspirin EC  81 mg Oral Daily  . bisacodyl  10 mg Rectal Once  . brimonidine  1 drop Right Eye BID  . dextromethorphan-guaiFENesin  1 tablet Oral BID  . docusate sodium  100 mg Oral BID  . feeding supplement (NEPRO CARB STEADY)  237 mL Oral BID  . feeding supplement (PRO-STAT SUGAR FREE 64)  30 mL Oral Daily  . heparin  5,000 Units Subcutaneous 3 times per day  . nystatin   Topical BID  . sodium chloride  3 mL Intravenous Q12H     acetaminophen, bisacodyl, heparin, heparin, hydrALAZINE, ondansetron **OR** ondansetron (ZOFRAN) IV, senna-docusate, sorbitol, traMADol

## 2014-05-14 NOTE — Progress Notes (Signed)
Progress Note  Curtis Brock [name] DOB: 08/03/1925 DOA: 05/01/2014 PCP: Jenny Reichmann, MD  Admit HPI / Brief Narrative: 81 y.o. 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 In ED workup was significant for acute renal failure with a creatinine of 2, hypothermia with temperature of 92.4, urinalysis was negative, no acute finding on chest x-ray, blood cultures were sent, CT head with no acute findings.   Patient's crt continued to increase after admission to [facility], he remained anuric despite being +7000 mL positive, and was therefore transferred to [facility] for possible need of hemodialysis in near future. Has so far received 1 HD treatment (4/24) and renal is discussing/debating regarding future treatment given patient overall health status and life quality declined.  HPI/Subjective: Patient sleepy, keep eyes close, answer question, follows some command. Relates cough is better.   Assessment/Plan:  Acute renal failure / anuric. -Nephrology following.  -patient initiated on temporary HD treatment -Patient remove/pull  HD catheter 4-26. -Family wants to try dialysis for now.  -for dialysis today.   Constipation;  KUB negative. Received dulcolax suppository  Had BM.   Anemia;  Of acute illness, anemia of chronic diseases.  Transfusion today with dialysis.  Check ferritin and iron level.   Aspiration PNA; Cough; started guaifenesin. Chest x ray: with RUL infiltrates.  Started on Augmentin.   Dysphagia; worsening dysphagia.  Speech consulted.  MRI negative for stroke.    SIRS -Patient meets SIRS criteria on admission given his hypothermia, tachypnea; but not findings supporting sepsis -resolved.  -No convincing radiographic or clinical evidence of infection; will continue monitoring off antibiotics for now  Elevated LFTs -ultrasound showing 1 cm gallstone lodged in the  gallbladder neck with no evidence of cholecystitis, CT abdomen pelvis without contrast with no acute finding. -No abdominal pain.  -LFTs continuing to trend down -will monitor for now  Pressure ulcers/sacral decubitus ulcer -Wound care following -continue following preventive measures -on air mattress   Acute encephalopathy -most lekly associated with uremia - ammonia level at 21.   History of CVA -Continue aspirin 81 mg daily for secondary prevention -no new deficit -worsening dysphagia. Will check MRI.   Fungal rash in groin and abdominal fold areas - Treated with Diflucan, continue with nystatin powder and keep area clean and dry  Essential hypertension -Continue to hold lasix and Cozaar in setting of renal failure.  -Will order PRN Hydralazine.   Diabetes mellitus Type 2 controlled -Does not take any home medication, CBGs are controlled without insulin. -4/22 Hemoglobin A1c=6.5 -Lipid panel; LDL slightly elevated by 88 guidelines and HDL low will hold on adding additional medication at this time. Also patient with elevated LFT>   Hyperkalemia -Kayexalate 60 gm  2 doses -will monitor  Metabolic acidosis -will discontinue sodium bicarb.  -renal on board, will follow rec's  Back pain   -will continue PRN pain medications (Tramadol 50 mg QID PRN) -Unable to treat pain with NSAIDs or Tylenol secondary patient's renal and liver failure  Hypothermic -resolved  Code Status: DNR Family Communication: daughter at bedside.  Disposition Plan: SNF  Consultants: Dr.Robert Doctor, hospital (nephrology) Dr. Rhea Pink (palliative care)  Procedure/Significant Events: 4/17 CT head without contrast;-no acute findings-small old lacune infarcts the right base a ganglia and left periventricular white matter.  4/17 ultrasound RUQ;1.0 cm gallstone lodged in the gallbladder neck with no evidence of cholecystitis.  4/18 CT abdomen pelvis without contrast;No acute  intra-abdominal or  intrapelvic abnormalities. -Minimal RIGHT pleural effusion and basilar atelectasis. -Cholelithiasis with otherwise unremarkable gallbladder.-Sigmoid diverticulosis. -Umbilical hernia containing fat. 4/18 echocardiogram;- Left ventricle: suggest that overall EF is probably normal. -(grade 1diastolic dysfunction). - Pulmonary arteries: PA peak pressure: 39 mm Hg (S).  Culture No growth appreciated in any culture.  Antibiotics: Zosyn 4/17 > >stopped 4/20 Vancomycin>> stopped4/17   DVT prophylaxis: Subcutaneous heparin  LINES / TUBES:       Objective: VITAL SIGNS: Temp: 98.2 F (36.8 C) (04/30 1155) Temp Source: Oral (04/30 1155) BP: 123/60 mmHg (04/30 0822) Pulse Rate: 86 (04/30 0822) SPO2; FIO2:   Intake/Output Summary (Last 24 hours) at 05/14/14 1304 Last data filed at 05/14/14 1302  Gross per 24 hour  Intake    396 ml  Output     50 ml  Net    346 ml     Exam: General: No acute respiratory distress. Sleepy but wake up and answer questions.  Lungs: bilateral ronchus.  Cardiovascular: Regular rate and rhythm, without murmur, gallops or rub normal S1 and S2 Abdomen: Nontender, nondistended, soft, bowel sounds positive, no rebound,  Extremities: No significant cyanosis, clubbing. Bilateral +1 pitting edema to knees.    Data Reviewed: Basic Metabolic Panel:  Recent Labs Lab 05/09/14 0606 05/10/14 0739 05/11/14 1040 05/11/14 1058 05/12/14 0530 05/13/14 1436  NA 139 141  --  140 138 138  K 4.2 4.4  --  4.7 4.6 3.9  CL 102 105  --  105 101 101  CO2 22 24  --  23 23 25   GLUCOSE 129* 97  --  124* 126* 106*  BUN 70* 80*  --  91* 97* 66*  CREATININE 7.53* 8.34*  --  8.89* 9.97* 7.04*  CALCIUM 7.6* 7.9*  --  8.2* 8.2* 7.9*  MG 2.0 1.9 2.0  --  2.0 2.1  PHOS  --   --   --  8.8* 9.2*  --    Liver Function Tests:  Recent Labs Lab 05/08/14 0321 05/09/14 0606 05/10/14 0739 05/11/14 1058 05/12/14 0530  AST 73* 73* 65*  --   --   ALT 103* 103* 93*  --    --   ALKPHOS 59 60 61  --   --   BILITOT 0.5 0.6 0.6  --   --   PROT 4.8* 4.8* 4.7*  --   --   ALBUMIN 1.9* 2.0* 1.9* 2.0* 2.1*    Recent Labs Lab 05/11/14 1345  AMMONIA 21   CBC:  Recent Labs Lab 05/09/14 0606 05/11/14 1058 05/13/14 1436  WBC 6.9 9.5 9.9  NEUTROABS 4.2  --   --   HGB 8.7* 8.7* 7.5*  HCT 24.9* 25.8* 22.4*  MCV 77.8* 78.2 78.9  PLT 208 239 214   CBG:  Recent Labs Lab 05/13/14 2012 05/14/14 0121 05/14/14 0751 05/14/14 1118 05/14/14 1154  GLUCAP 170* 116* 93 128* 125*    No results found for this or any previous visit (from the past 240 hour(s)).   Studies:  Recent x-ray studies have been reviewed in detail by the Attending Physician  Scheduled Meds:  Scheduled Meds: . amoxicillin-clavulanate  500 mg Oral Q24H  . aspirin EC  81 mg Oral Daily  . bisacodyl  10 mg Rectal Once  . brimonidine  1 drop Right Eye BID  . docusate sodium  100 mg Oral BID  . feeding supplement (NEPRO CARB STEADY)  237 mL Oral BID  . feeding supplement (PRO-STAT SUGAR FREE  64)  30 mL Oral Daily  . heparin  5,000 Units Subcutaneous 3 times per day  . multivitamin  1 tablet Oral QHS  . nystatin   Topical BID  . sodium chloride  3 mL Intravenous Q12H    Time spent on care of this patient: 30 mins   Erlinda Solinger A , MD [phone]  If 7PM-7AM, please contact night-coverage [email or web address] Password [email or web address] 05/14/2014, 1:04 PM   LOS: 13 days

## 2014-05-14 NOTE — Progress Notes (Signed)
Pt received 2 units of PRBCs; no s/s of a rxn noted. Pt tolerated transfusion well.

## 2014-05-15 ENCOUNTER — Inpatient Hospital Stay (HOSPITAL_COMMUNITY): Payer: Medicare Other

## 2014-05-15 LAB — BASIC METABOLIC PANEL
Anion gap: 11 (ref 5–15)
BUN: 44 mg/dL — ABNORMAL HIGH (ref 6–20)
CALCIUM: 7.8 mg/dL — AB (ref 8.9–10.3)
CHLORIDE: 103 mmol/L (ref 101–111)
CO2: 25 mmol/L (ref 22–32)
CREATININE: 5.48 mg/dL — AB (ref 0.61–1.24)
GFR calc non Af Amer: 8 mL/min — ABNORMAL LOW (ref >60)
GFR, EST AFRICAN AMERICAN: 10 mL/min — AB (ref >60)
Glucose, Bld: 88 mg/dL (ref 70–99)
Potassium: 3.8 mmol/L (ref 3.5–5.1)
SODIUM: 139 mmol/L (ref 135–145)

## 2014-05-15 LAB — CBC
HCT: 27.3 % — ABNORMAL LOW (ref 39.0–52.0)
Hemoglobin: 9.1 g/dL — ABNORMAL LOW (ref 13.0–17.0)
MCH: 27 pg (ref 26.0–34.0)
MCHC: 33.3 g/dL (ref 30.0–36.0)
MCV: 81 fL (ref 78.0–100.0)
Platelets: 192 10*3/uL (ref 150–400)
RBC: 3.37 MIL/uL — ABNORMAL LOW (ref 4.22–5.81)
RDW: 14.6 % (ref 11.5–15.5)
WBC: 10.4 10*3/uL (ref 4.0–10.5)

## 2014-05-15 LAB — TYPE AND SCREEN
ABO/RH(D): O POS
Antibody Screen: NEGATIVE
Unit division: 0
Unit division: 0

## 2014-05-15 LAB — GLUCOSE, CAPILLARY
GLUCOSE-CAPILLARY: 111 mg/dL — AB (ref 70–99)
Glucose-Capillary: 103 mg/dL — ABNORMAL HIGH (ref 70–99)
Glucose-Capillary: 104 mg/dL — ABNORMAL HIGH (ref 70–99)
Glucose-Capillary: 104 mg/dL — ABNORMAL HIGH (ref 70–99)
Glucose-Capillary: 105 mg/dL — ABNORMAL HIGH (ref 70–99)
Glucose-Capillary: 119 mg/dL — ABNORMAL HIGH (ref 70–99)
Glucose-Capillary: 93 mg/dL (ref 70–99)

## 2014-05-15 LAB — MAGNESIUM: Magnesium: 1.8 mg/dL (ref 1.7–2.4)

## 2014-05-15 MED ORDER — ACETAMINOPHEN 325 MG PO TABS
650.0000 mg | ORAL_TABLET | Freq: Four times a day (QID) | ORAL | Status: DC | PRN
  Administered 2014-05-15 – 2014-05-24 (×5): 650 mg via ORAL
  Filled 2014-05-15 (×4): qty 2

## 2014-05-15 NOTE — Progress Notes (Signed)
Progress Note  Curtis Brock [name] DOB: 07-Oct-1925 DOA: 05/01/2014 PCP: Jenny Reichmann, MD  Admit HPI / Brief Narrative: 81 y.o. 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 In ED workup was significant for acute renal failure with a creatinine of 2, hypothermia with temperature of 92.4, urinalysis was negative, no acute finding on chest x-ray, blood cultures were sent, CT head with no acute findings.   Patient's crt continued to increase after admission to [facility], he remained anuric despite being +7000 mL positive, and was therefore transferred to [facility] for possible need of hemodialysis in near future. Has so far received 1 HD treatment (4/24) and renal is discussing/debating regarding future treatment given patient overall health status and life quality declined.  HPI/Subjective: Awake, complaining of knees pain. Still coughing a lot.   Assessment/Plan:  Acute renal failure / anuric. -Nephrology following.  -patient initiated on temporary HD treatment -Patient remove/pull  HD catheter 4-26. -Family wants to try dialysis for now.  -had dialysis 4-30.  Constipation;  KUB negative. Received dulcolax suppository  Had BM.   Anemia;  Of acute illness, anemia of chronic diseases.  Received 2 units PRBC 4-30  ferritin 353 and iron level 23.   Aspiration PNA; Cough; started guaifenesin. Chest x ray: with RUL infiltrates.  Started on Augmentin 4-29.   Dysphagia; worsening dysphagia.  Speech consulted.  MRI negative for stroke.    SIRS -Patient meets SIRS criteria on admission given his hypothermia, tachypnea; but not findings supporting sepsis -resolved.  -No convincing radiographic or clinical evidence of infection; will continue monitoring off antibiotics for now  Elevated LFTs -ultrasound showing 1 cm gallstone lodged in the gallbladder neck with no evidence of  cholecystitis, CT abdomen pelvis without contrast with no acute finding. -No abdominal pain.  -LFTs continuing to trend down -will monitor for now  Pressure ulcers/sacral decubitus ulcer -Wound care following -continue following preventive measures -on air mattress   Acute encephalopathy -most lekly associated with uremia - ammonia level at 21.   History of CVA -Continue aspirin 81 mg daily for secondary prevention -no new deficit -worsening dysphagia. Will check MRI.   Fungal rash in groin and abdominal fold areas - Treated with Diflucan, continue with nystatin powder and keep area clean and dry  Essential hypertension -Continue to hold lasix and Cozaar in setting of renal failure.  -Will order PRN Hydralazine.   Diabetes mellitus Type 2 controlled -Does not take any home medication, CBGs are controlled without insulin. -4/22 Hemoglobin A1c=6.5 -Lipid panel; LDL slightly elevated by 88 guidelines and HDL low will hold on adding additional medication at this time. Also patient with elevated LFT>   Hyperkalemia -Kayexalate 60 gm  2 doses -will monitor  Metabolic acidosis -will discontinue sodium bicarb.  -renal on board, will follow rec's  Back pain   -will continue PRN pain medications (Tramadol 50 mg QID PRN) -Unable to treat pain with NSAIDs or Tylenol secondary patient's renal and liver failure  Hypothermic -resolved  Code Status: DNR Family Communication: daughter updated 4-30 Disposition Plan: SNF  Consultants: Dr.Robert Doctor, hospital (nephrology) Dr. Rhea Pink (palliative care)  Procedure/Significant Events: 4/17 CT head without contrast;-no acute findings-small old lacune infarcts the right base a ganglia and left periventricular white matter.  4/17 ultrasound RUQ;1.0 cm gallstone lodged in the gallbladder neck with no evidence of cholecystitis.  4/18 CT abdomen pelvis without contrast;No acute intra-abdominal or intrapelvic abnormalities. -  Minimal  RIGHT pleural effusion and basilar atelectasis. -Cholelithiasis with otherwise unremarkable gallbladder.-Sigmoid diverticulosis. -Umbilical hernia containing fat. 4/18 echocardiogram;- Left ventricle: suggest that overall EF is probably normal. -(grade 1diastolic dysfunction). - Pulmonary arteries: PA peak pressure: 39 mm Hg (S).  Culture No growth appreciated in any culture.  Antibiotics: Zosyn 4/17 > >stopped 4/20 Vancomycin>> stopped4/17   DVT prophylaxis: Subcutaneous heparin  LINES / TUBES:       Objective: VITAL SIGNS: Temp: 98.8 F (37.1 C) (05/01 0407) Temp Source: Oral (05/01 0407) BP: 115/54 mmHg (05/01 0407) Pulse Rate: 77 (05/01 0407) SPO2; FIO2:   Intake/Output Summary (Last 24 hours) at 05/15/14 1428 Last data filed at 05/15/14 1348  Gross per 24 hour  Intake    930 ml  Output   3217 ml  Net  -2287 ml     Exam: General: No acute respiratory distress. Alert today Lungs: bilateral ronchus.  Cardiovascular: Regular rate and rhythm, without murmur, gallops or rub normal S1 and S2 Abdomen: Nontender, nondistended, soft, bowel sounds positive, no rebound,  Extremities: No significant cyanosis, clubbing. Bilateral +1 pitting edema to knees.    Data Reviewed: Basic Metabolic Panel:  Recent Labs Lab 05/11/14 1040 05/11/14 1058 05/12/14 0530 05/13/14 1436 05/14/14 1500 05/15/14 0734  NA  --  140 138 138 138 139  K  --  4.7 4.6 3.9 4.1 3.8  CL  --  105 101 101 101 103  CO2  --  23 23 25 26 25   GLUCOSE  --  124* 126* 106* 103* 88  BUN  --  91* 97* 66* 77* 44*  CREATININE  --  8.89* 9.97* 7.04* 7.98* 5.48*  CALCIUM  --  8.2* 8.2* 7.9* 7.7* 7.8*  MG 2.0  --  2.0 2.1 2.1 1.8  PHOS  --  8.8* 9.2*  --   --   --    Liver Function Tests:  Recent Labs Lab 05/09/14 0606 05/10/14 0739 05/11/14 1058 05/12/14 0530  AST 73* 65*  --   --   ALT 103* 93*  --   --   ALKPHOS 60 61  --   --   BILITOT 0.6 0.6  --   --   PROT 4.8* 4.7*  --   --     ALBUMIN 2.0* 1.9* 2.0* 2.1*    Recent Labs Lab 05/11/14 1345  AMMONIA 21   CBC:  Recent Labs Lab 05/09/14 0606 05/11/14 1058 05/13/14 1436 05/14/14 1500  WBC 6.9 9.5 9.9 9.5  NEUTROABS 4.2  --   --   --   HGB 8.7* 8.7* 7.5* 7.0*  HCT 24.9* 25.8* 22.4* 20.8*  MCV 77.8* 78.2 78.9 80.9  PLT 208 239 214 210   CBG:  Recent Labs Lab 05/14/14 2007 05/15/14 05/15/14 0405 05/15/14 0725 05/15/14 1221  GLUCAP 101* 105* 104* 93 103*    No results found for this or any previous visit (from the past 240 hour(s)).   Studies:  Recent x-ray studies have been reviewed in detail by the Attending Physician  Scheduled Meds:  Scheduled Meds: . sodium chloride   Intravenous Once  . amoxicillin-clavulanate  500 mg Oral Q24H  . aspirin EC  81 mg Oral Daily  . bisacodyl  10 mg Rectal Once  . brimonidine  1 drop Right Eye BID  . docusate sodium  100 mg Oral BID  . feeding supplement (NEPRO CARB STEADY)  237 mL Oral BID  . feeding supplement (PRO-STAT SUGAR FREE 64)  30 mL  Oral Daily  . heparin  5,000 Units Subcutaneous 3 times per day  . multivitamin  1 tablet Oral QHS  . nystatin   Topical BID  . sodium chloride  3 mL Intravenous Q12H    Time spent on care of this patient: 30 mins   Ritu Gagliardo A , MD [phone]  If 7PM-7AM, please contact night-coverage [email or web address] Password [email or web address] 05/15/2014, 2:28 PM   LOS: 14 days

## 2014-05-15 NOTE — Progress Notes (Signed)
MBS complete, report can can be found under imaging.  Arvil Chaco MA, [facility] Speech Language Pathologist

## 2014-05-15 NOTE — Progress Notes (Signed)
Speech Language Pathology Treatment:    Patient Details Name: Curtis Brock MRN: [id number] DOB: 26-Jul-1925 Today's Date: 05/15/2014 Time: [id number] SLP Time Calculation (min) (ACUTE ONLY): 46 min  Assessment / Plan / Recommendation Clinical Impression  Pt seen for tolerance of current diet and readiness for objective swallow study. Pt displaying significant worsening of swallowing consistently with thin liquid via teaspoon and puree consistencies. Nectar thick liquid trials ineffective at reducing signs and symptoms of distress including throat clearing and immediate coughing. Patient to be transported this date for MBS objective swallow study for determination of safest least restrictive diet.   HPI Other Pertinent Information: 81 y.o. male, with past medical history of diabetes mellitus, CVA, hyperlipidemia, altered mental status, shunt admitted with increased lethargy and confusion than baseline. Pt found to be in acute renal failure. CT head with no acute findings. CXR No acute cardiopulmonary disease. MBS 06/17/12 Mild oral phase dysphagia;Mild pharyngeal phase dysphagia;Mild cervical esophageal phase dysphagia; cervical osteophytes at C2-C3, C3-C4, C4-C5, C5-C6 mildly impairing barium flow into esophagus (C5-C6 mostly impacting swallow). Dys 3 and thin liquids recommended.   Pertinent Vitals Pain Assessment: No/denies pain  SLP Plan  MBS    Recommendations Diet recommendations:  (pending MBS )              Oral Care Recommendations: Oral care QID Plan: MBS    GO    Curtis Chaco MA, CCC-SLP Acute Care Speech Language Pathologist    Levi Aland 05/15/2014, 9:13 AM

## 2014-05-15 NOTE — Progress Notes (Signed)
[facility] Progress Note   Subjective:  Awake, no new events Remains very weak, mostly in bed UOP 450 mL yesterday HD #3 yesterday, 4 hours, 2.7 L UF, no complications Transfused 2 units during dialysis   Filed Vitals:   05/14/14 1900 05/14/14 2004 05/15/14 0407 05/15/14 0500  BP: 123/55 140/83 115/54   Pulse: 78 81 77   Temp: 98.3 F (36.8 C) 98.1 F (36.7 C) 98.8 F (37.1 C)   TempSrc: Oral Oral Oral   Resp: 18 17 16    Height:      Weight:  117.1 kg (258 lb 2.5 oz)  117.1 kg (258 lb 2.5 oz)  SpO2: 96% 100% 99%    Exam: Elderly AAM, no distress, frail,  Chest clear bilat RRR no MRG Abd obese, NTND, no mass or ascites, no HSM 1+ LE edema bilat Neuro alert, responsive, no asterixis, weak SKIN: R IJ TDC bandaged  Assessment: 1. AKI shock/ ARB  1. Last creat 1.01 in Jan 2016 2. HD #1 4/24, #2 4/28, #3 4/30 3. Dialysis initiated as a temporary measure to try to facilitate functional improvements, if ineffective the patient was very clear that he never wish to receive long-term hemodialysis. 4. See family conversation in Note on 4/27 5. Will try to talk with family today 6. To this point he has had little global/functional improvements with the onset of dialysis. 7. Cont to follow closely for recovery,  especially urine output 8. Tentative next dialysis on 5/3 2. Shock/ low BP resolved, w/u neg abx stopped 3. HTN home meds on hold 4. Blind L eye 5. DM 6. Hx falls / debility 7. AMS     Pearson Grippe, MD  05/15/2014, 10:16 AM     Recent Labs Lab 05/11/14 1058 05/12/14 0530 05/13/14 1436 05/14/14 1500 05/15/14 0734  NA 140 138 138 138 139  K 4.7 4.6 3.9 4.1 3.8  CL 105 101 101 101 103  CO2 23 23 25 26 25   GLUCOSE 124* 126* 106* 103* 88  BUN 91* 97* 66* 77* 44*  CREATININE 8.89* 9.97* 7.04* 7.98* 5.48*  CALCIUM 8.2* 8.2* 7.9* 7.7* 7.8*  PHOS 8.8* 9.2*  --   --   --     Recent Labs Lab 05/09/14 0606 05/10/14 0739 05/11/14 1058  05/12/14 0530  AST 73* 65*  --   --   ALT 103* 93*  --   --   ALKPHOS 60 61  --   --   BILITOT 0.6 0.6  --   --   PROT 4.8* 4.7*  --   --   ALBUMIN 2.0* 1.9* 2.0* 2.1*    Recent Labs Lab 05/09/14 0606 05/11/14 1058 05/13/14 1436 05/14/14 1500  WBC 6.9 9.5 9.9 9.5  NEUTROABS 4.2  --   --   --   HGB 8.7* 8.7* 7.5* 7.0*  HCT 24.9* 25.8* 22.4* 20.8*  MCV 77.8* 78.2 78.9 80.9  PLT 208 239 214 210   . sodium chloride   Intravenous Once  . amoxicillin-clavulanate  500 mg Oral Q24H  . aspirin EC  81 mg Oral Daily  . bisacodyl  10 mg Rectal Once  . brimonidine  1 drop Right Eye BID  . docusate sodium  100 mg Oral BID  . feeding supplement (NEPRO CARB STEADY)  237 mL Oral BID  . feeding supplement (PRO-STAT SUGAR FREE 64)  30 mL Oral Daily  . heparin  5,000 Units Subcutaneous 3 times per day  . multivitamin  1 tablet Oral QHS  . nystatin   Topical BID  . sodium chloride  3 mL Intravenous Q12H     acetaminophen, bisacodyl, heparin, heparin, hydrALAZINE, MUSCLE RUB, ondansetron **OR** ondansetron (ZOFRAN) IV, senna-docusate, sorbitol, traMADol

## 2014-05-16 LAB — BASIC METABOLIC PANEL WITH GFR
Anion gap: 9 (ref 5–15)
BUN: 53 mg/dL — ABNORMAL HIGH (ref 6–20)
CO2: 27 mmol/L (ref 22–32)
Calcium: 7.9 mg/dL — ABNORMAL LOW (ref 8.9–10.3)
Chloride: 104 mmol/L (ref 101–111)
Creatinine, Ser: 6.38 mg/dL — ABNORMAL HIGH (ref 0.61–1.24)
GFR calc Af Amer: 8 mL/min — ABNORMAL LOW
GFR calc non Af Amer: 7 mL/min — ABNORMAL LOW
Glucose, Bld: 98 mg/dL (ref 70–99)
Potassium: 4.1 mmol/L (ref 3.5–5.1)
Sodium: 140 mmol/L (ref 135–145)

## 2014-05-16 LAB — CBC
HEMATOCRIT: 27.2 % — AB (ref 39.0–52.0)
Hemoglobin: 9.1 g/dL — ABNORMAL LOW (ref 13.0–17.0)
MCH: 27.2 pg (ref 26.0–34.0)
MCHC: 33.5 g/dL (ref 30.0–36.0)
MCV: 81.2 fL (ref 78.0–100.0)
PLATELETS: 214 10*3/uL (ref 150–400)
RBC: 3.35 MIL/uL — ABNORMAL LOW (ref 4.22–5.81)
RDW: 14.4 % (ref 11.5–15.5)
WBC: 10.5 10*3/uL (ref 4.0–10.5)

## 2014-05-16 LAB — GLUCOSE, CAPILLARY
GLUCOSE-CAPILLARY: 92 mg/dL (ref 70–99)
Glucose-Capillary: 100 mg/dL — ABNORMAL HIGH (ref 70–99)
Glucose-Capillary: 134 mg/dL — ABNORMAL HIGH (ref 70–99)
Glucose-Capillary: 123 mg/dL — ABNORMAL HIGH (ref 70–99)
Glucose-Capillary: 112 mg/dL — ABNORMAL HIGH (ref 70–99)

## 2014-05-16 LAB — MAGNESIUM: Magnesium: 2 mg/dL (ref 1.7–2.4)

## 2014-05-16 NOTE — Progress Notes (Signed)
Progress Note  Curtis Brock [name] DOB: 02/03/1925 DOA: 05/01/2014 PCP: Jenny Reichmann, MD  Admit HPI / Brief Narrative: 81 y.o. 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 In ED workup was significant for acute renal failure with a creatinine of 2, hypothermia with temperature of 92.4, urinalysis was negative, no acute finding on chest x-ray, blood cultures were sent, CT head with no acute findings.   Patient's crt continued to increase after admission to [facility], he remained anuric despite being +7000 mL positive, and was therefore transferred to [facility] for possible need of hemodialysis in near future. Has so far received 1 HD treatment (4/24) and renal is discussing/debating regarding future treatment given patient overall health status and life quality declined.  HPI/Subjective: Lethargic this morning, again,. Keeps eyes close, answer questions. Move upper extremities.  Received tramadol today.   Assessment/Plan: Acute renal failure / anuric. -Nephrology following.  -patient initiated on temporary HD treatment -Patient remove/pull  HD catheter 4-26. -Family wants to try dialysis for now.  -had dialysis 4-30. Dialysis again 5-3.   Constipation;  KUB negative. Received dulcolax suppository  Had BM.   Anemia;  Of acute illness, anemia of chronic diseases.  Received 2 units PRBC 4-30 ferritin 353 and iron level 23.  Hb at 9.  Acute encephalopathy -Fluctuates. Today sleepy. Awaiting to see improvement with dialysis. Renal following.  - ammonia level at 21.   Aspiration PNA; Cough; started guaifenesin. Chest x ray: with RUL infiltrates.  Started on Augmentin 4-29. Day; 4.  Dysphagia; worsening dysphagia.  Speech consulted.  MRI negative for stroke.    SIRS -Patient meets SIRS criteria on admission given his hypothermia, tachypnea; but not findings supporting  sepsis -resolved.  -No convincing radiographic or clinical evidence of infection; will continue monitoring off antibiotics for now  Elevated LFTs -ultrasound showing 1 cm gallstone lodged in the gallbladder neck with no evidence of cholecystitis, CT abdomen pelvis without contrast with no acute finding. -No abdominal pain.  -LFTs continuing to trend down -will monitor for now  Pressure ulcers/sacral decubitus ulcer -Wound care following -continue following preventive measures -on air mattress   History of CVA -Continue aspirin 81 mg daily for secondary prevention -no new deficit -worsening dysphagia. Will check MRI.   Fungal rash in groin and abdominal fold areas - Treated with Diflucan, continue with nystatin powder and keep area clean and dry  Essential hypertension -Continue to hold lasix and Cozaar in setting of renal failure.  -Will order PRN Hydralazine.   Diabetes mellitus Type 2 controlled -Does not take any home medication, CBGs are controlled without insulin. -4/22 Hemoglobin A1c=6.5 -Lipid panel; LDL slightly elevated by 88 guidelines and HDL low will hold on adding additional medication at this time. Also patient with elevated LFT>   Hyperkalemia -Kayexalate 60 gm  2 doses -will monitor  Metabolic acidosis -will discontinue sodium bicarb.  -renal on board, will follow rec's  Back pain   -will continue PRN pain medications (Tramadol 50 mg QID PRN) -Unable to treat pain with NSAIDs or Tylenol secondary patient's renal and liver failure  Hypothermic -resolved  Code Status: DNR Family Communication: daughter updated 4-30 Disposition Plan: SNF  Consultants: Dr.Robert Doctor, hospital (nephrology) Dr. Rhea Pink (palliative care)  Procedure/Significant Events: 4/17 CT head without contrast;-no acute findings-small old lacune infarcts the right base a ganglia and left periventricular white matter.  4/17 ultrasound RUQ;1.0 cm gallstone lodged in  the  gallbladder neck with no evidence of cholecystitis.  4/18 CT abdomen pelvis without contrast;No acute intra-abdominal or intrapelvic abnormalities. -Minimal RIGHT pleural effusion and basilar atelectasis. -Cholelithiasis with otherwise unremarkable gallbladder.-Sigmoid diverticulosis. -Umbilical hernia containing fat. 4/18 echocardiogram;- Left ventricle: suggest that overall EF is probably normal. -(grade 1diastolic dysfunction). - Pulmonary arteries: PA peak pressure: 39 mm Hg (S).  Culture No growth appreciated in any culture.  Antibiotics: Zosyn 4/17 > >stopped 4/20 Vancomycin>> stopped4/17   DVT prophylaxis: Subcutaneous heparin  LINES / TUBES:       Objective: VITAL SIGNS: Temp: 98.2 F (36.8 C) (05/02 1041) Temp Source: Oral (05/02 1041) BP: 118/54 mmHg (05/02 1041) Pulse Rate: 82 (05/02 1041) SPO2; FIO2:   Intake/Output Summary (Last 24 hours) at 05/16/14 1341 Last data filed at 05/16/14 1042  Gross per 24 hour  Intake    260 ml  Output    450 ml  Net   -190 ml     Exam: General: No acute respiratory distress.  Lungs: bilateral ronchus.  Cardiovascular: Regular rate and rhythm, without murmur, gallops or rub normal S1 and S2 Abdomen: Nontender, nondistended, soft, bowel sounds positive, no rebound,  Extremities: No significant cyanosis, clubbing. Bilateral +1 pitting edema to knees.    Data Reviewed: Basic Metabolic Panel:  Recent Labs Lab 05/11/14 1058 05/12/14 0530 05/13/14 1436 05/14/14 1500 05/15/14 0734 05/16/14 0613  NA 140 138 138 138 139 140  K 4.7 4.6 3.9 4.1 3.8 4.1  CL 105 101 101 101 103 104  CO2 23 23 25 26 25 27   GLUCOSE 124* 126* 106* 103* 88 98  BUN 91* 97* 66* 77* 44* 53*  CREATININE 8.89* 9.97* 7.04* 7.98* 5.48* 6.38*  CALCIUM 8.2* 8.2* 7.9* 7.7* 7.8* 7.9*  MG  --  2.0 2.1 2.1 1.8 2.0  PHOS 8.8* 9.2*  --   --   --   --    Liver Function Tests:  Recent Labs Lab 05/10/14 0739 05/11/14 1058 05/12/14 0530  AST  65*  --   --   ALT 93*  --   --   ALKPHOS 61  --   --   BILITOT 0.6  --   --   PROT 4.7*  --   --   ALBUMIN 1.9* 2.0* 2.1*    Recent Labs Lab 05/11/14 1345  AMMONIA 21   CBC:  Recent Labs Lab 05/11/14 1058 05/13/14 1436 05/14/14 1500 05/15/14 1507 05/16/14 0613  WBC 9.5 9.9 9.5 10.4 10.5  HGB 8.7* 7.5* 7.0* 9.1* 9.1*  HCT 25.8* 22.4* 20.8* 27.3* 27.2*  MCV 78.2 78.9 80.9 81.0 81.2  PLT 239 214 210 192 214   CBG:  Recent Labs Lab 05/15/14 2041 05/15/14 2344 05/16/14 0523 05/16/14 0755 05/16/14 1211  GLUCAP 119* 104* 92 100* 134*    No results found for this or any previous visit (from the past 240 hour(s)).   Studies:  Recent x-ray studies have been reviewed in detail by the Attending Physician  Scheduled Meds:  Scheduled Meds: . sodium chloride   Intravenous Once  . amoxicillin-clavulanate  500 mg Oral Q24H  . aspirin EC  81 mg Oral Daily  . bisacodyl  10 mg Rectal Once  . brimonidine  1 drop Right Eye BID  . docusate sodium  100 mg Oral BID  . feeding supplement (NEPRO CARB STEADY)  237 mL Oral BID  . feeding supplement (PRO-STAT SUGAR FREE 64)  30 mL Oral Daily  . heparin  5,000 Units  Subcutaneous 3 times per day  . multivitamin  1 tablet Oral QHS  . nystatin   Topical BID  . sodium chloride  3 mL Intravenous Q12H    Time spent on care of this patient: 30 mins   Labria Wos A , MD [phone]  If 7PM-7AM, please contact night-coverage [email or web address] Password [email or web address] 05/16/2014, 1:41 PM   LOS: 15 days

## 2014-05-16 NOTE — Progress Notes (Signed)
UR Completed.  [phone]  

## 2014-05-16 NOTE — Progress Notes (Signed)
Physical Therapy Treatment Patient Details Name: Curtis Brock MRN: [id number] DOB: Jul 27, 1925 Today's Date: 05/16/2014    History of Present Illness 81 yo male admitted with SIRS, sepsis, AMS. as of 4/24, pt with temporary femoral HD catheter; Temporary femoral HD catheter removed 4/26, and PT reordered; Hx of DM,CVA    PT Comments    Decr arousal effecting ability to participate today; Curtis Brock' eyes were closed the majority of session, but he did have them open while sitting up at EOB; Will benefit from being OOB in the chair daily; at this point, [place] is safest way to help him up to chair   Follow Up Recommendations  SNF;Supervision/Assistance - 24 hour     Equipment Recommendations  Other (comment) (to be determined, based on Goals of Care)    Recommendations for Other Services OT consult     Precautions / Restrictions Precautions Precautions: Fall    Mobility  Bed Mobility Overal bed mobility: Needs Assistance Bed Mobility: Rolling;Supine to Sit Rolling: Mod assist;+2 for physical assistance Sidelying to sit: +2 for physical assistance;Max assist Supine to sit: Max assist;+2 for physical assistance;+2 for safety/equipment;HOB elevated Sit to supine: Max assist;+2 for physical assistance;+2 for safety/equipment;HOB elevated   General bed mobility comments: Heavy mod assist to roll; cues to initiate by looking to side where rolling; Max assist to clear feet and knees from EOB;  +2 assist to sit up, with support posteriorly as well as in front  Transfers                    Ambulation/Gait                 Stairs            Wheelchair Mobility    Modified Rankin (Stroke Patients Only)       Balance Overall balance assessment: Needs assistance Sitting-balance support: Bilateral upper extremity supported Sitting balance-Leahy Scale: Zero Sitting balance - Comments: Sat EOB with mostly heavy mod/max assist with one moment of min assist;  worked on weight shifting anteriorly in sitting, wiht son sitting in front of pt and having pt reach for his son; mod/max assist with forward reaches Postural control: Posterior lean                          Cognition Arousal/Alertness: Lethargic (Eyes closed most of session; opened them while sitting EOB) Behavior During Therapy: [facility] for tasks assessed/performed Overall Cognitive Status: Impaired/Different from baseline Area of Impairment: Attention;Following commands   Current Attention Level: Sustained   Following Commands: Follows one step commands inconsistently;Follows one step commands with increased time     Problem Solving: Slow processing;Decreased initiation General Comments: Opened eyes when cued; majority of session with eyes closed; did answer the "[place]" when asked what his favorite football team is    Training and development officer - Lower Extremity Long Arc Quad: AROM;Both;10 reps;Seated (alternating; max support posteriorly)    General Comments        Pertinent Vitals/Pain Pain Assessment: Faces Faces Pain Scale: Hurts even more Pain Location: bil LEs Pain Descriptors / Indicators: Grimacing Pain Intervention(s): Limited activity within patient's tolerance;Monitored during session;Repositioned    Home Living                      Prior Function            PT Goals (current goals can now be found in the  care plan section) Acute Rehab PT Goals Patient Stated Goal: none stated PT Goal Formulation: Patient unable to participate in goal setting Time For Goal Achievement: 05/25/14 Potential to Achieve Goals: Fair Progress towards PT goals: Progressing toward goals (extremely slowly)    Frequency  Min 3X/week    PT Plan Current plan remains appropriate    Co-evaluation             End of Session   Activity Tolerance: Patient limited by fatigue (decr arousal) Patient left: in bed;with call bell/phone within  reach;with family/visitor present     Time: 1344-1410 PT Time Calculation (min) (ACUTE ONLY): 26 min  Charges:  $Therapeutic Activity: 23-37 mins                    G CodesQuin Hoop 05/16/2014, 3:07 PM  Roney [place], [place] Pager [phone] Office [phone]

## 2014-05-16 NOTE — Progress Notes (Signed)
[facility] Progress Note   Subjective:   HD #3 on Saturday, 4 hours, 2.7 L UF, no complications Transfused 2 units during dialysis Fairly lethargic today Keeps eyes closed No family in the room UOP 300/24 hours   Filed Vitals:   05/15/14 1752 05/15/14 2043 05/16/14 0524 05/16/14 1041  BP: 147/89 132/53 153/63 118/54  Pulse: 78 76 87 82  Temp: 98.2 F (36.8 C) 98.3 F (36.8 C) 97.8 F (36.6 C) 98.2 F (36.8 C)  TempSrc: Oral Oral Oral Oral  Resp: 17 16 18 18   Height:      Weight:      SpO2: 98% 96% 97% 98%   Exam: Elderly AAM, no distress, frail, lethargic Chest clear bilat Regular S1S2 No S3  no MRG Abd obese, NTND, no mass or ascites, no HSM 1+ LE edema bilat Neuro lethargic responsive, no asterixis, weak SKIN: R IJ TDC bandaged    Recent Labs Lab 05/11/14 1058 05/12/14 0530  05/14/14 1500 05/15/14 0734 05/16/14 0613  NA 140 138  < > 138 139 140  K 4.7 4.6  < > 4.1 3.8 4.1  CL 105 101  < > 101 103 104  CO2 23 23  < > 26 25 27   GLUCOSE 124* 126*  < > 103* 88 98  BUN 91* 97*  < > 77* 44* 53*  CREATININE 8.89* 9.97*  < > 7.98* 5.48* 6.38*  CALCIUM 8.2* 8.2*  < > 7.7* 7.8* 7.9*  PHOS 8.8* 9.2*  --   --   --   --   < > = values in this interval not displayed.  Recent Labs Lab 05/10/14 0739 05/11/14 1058 05/12/14 0530  AST 65*  --   --   ALT 93*  --   --   ALKPHOS 61  --   --   BILITOT 0.6  --   --   PROT 4.7*  --   --   ALBUMIN 1.9* 2.0* 2.1*    Recent Labs Lab 05/14/14 1500 05/15/14 1507 05/16/14 0613  WBC 9.5 10.4 10.5  HGB 7.0* 9.1* 9.1*  HCT 20.8* 27.3* 27.2*  MCV 80.9 81.0 81.2  PLT 210 192 214   . sodium chloride   Intravenous Once  . amoxicillin-clavulanate  500 mg Oral Q24H  . aspirin EC  81 mg Oral Daily  . bisacodyl  10 mg Rectal Once  . brimonidine  1 drop Right Eye BID  . docusate sodium  100 mg Oral BID  . feeding supplement (NEPRO CARB STEADY)  237 mL Oral BID  . feeding supplement (PRO-STAT SUGAR FREE  64)  30 mL Oral Daily  . heparin  5,000 Units Subcutaneous 3 times per day  . multivitamin  1 tablet Oral QHS  . nystatin   Topical BID  . sodium chloride  3 mL Intravenous Q12H     acetaminophen, acetaminophen, bisacodyl, heparin, heparin, hydrALAZINE, MUSCLE RUB, ondansetron **OR** ondansetron (ZOFRAN) IV, senna-docusate, sorbitol  Assessment: 1. AKI shock/ ARB  1. Last creat 1.01 in Jan 2016 2. HD #1 4/24, #2 4/28, #3 4/30 3. Dialysis initiated as a temporary measure to try to facilitate functional improvements, if ineffective the patient was very clear that he never wished to  receive long-term hemodialysis - he is so lethargic today he can't bven tell me that. 4. See family conversation in Note on 4/27 5. Unable to talk with family today.   6. To this point he has had little global/functional improvements with  the onset of dialysis. 7. Cont to follow closely for recovery 8. Tentative next dialysis on 5/3 (tomorow) 2. Shock/ low BP resolved, w/u neg abx stopped 3. HTN home meds on hold 4. Blind L eye 5. DM 6. Hx falls / debility 7. [place], MD [place] Pager 05/16/2014, 3:37 PM

## 2014-05-16 NOTE — Progress Notes (Signed)
Speech Language Pathology Treatment: Dysphagia  Patient Details Name: Curtis Brock MRN: [id number] DOB: 06-10-1925 Today's Date: 05/16/2014 Time: [id number] SLP Time Calculation (min) (ACUTE ONLY): 31 min  Assessment / Plan / Recommendation Clinical Impression  Upon SLP entrance to room, pt noted with gurgly voice, verbal cues to cough/expectorate with SLP providing oral suction helpful to clear viscous white tinged secretions.    SLP brushed pt's gums for oral hygiene and educated pt to importance of oral care.  SLP received assist from RN to slide pt up in bed for po intake.   Pt consumed approximately 3 ounces yogurt, 2 bites eggs, 2 bites grits, 1 ounce coffee and 3 ounces orange juice.  Delayed swallow noted across consistencies - more pronounced with puree than liquids.  Pt is not conducting dry swallow with liquids but is opening oral cavity to indicate readiness for intake with Mod I.      Pt is a laborious feeder *requiring 20- minutes to consume po accepted- which will increase his malnutrition and aspiration risk.  Toward end of snack, pt again demonstrated coughing/wet voice.  Uncertain if this is due to secretions and/or pharyngeal residuals.  SLP at that time ceased pt's intake.  Pt educated to clinical reasoning for ceasing po at that time.   Pt is afebrile with lungs decreased and on room air. Note MRI negative as well as CXR.  Recommend to continue dys1/thin diet with strict precautions.  SLP to follow up for pt/family education and readiness for dietary advancement.  Educated nurse to findings.    HPI Other Pertinent Information: 81 y.o. male, with past medical history of diabetes mellitus, CVA, hyperlipidemia, altered mental status, shunt admitted with increased lethargy and confusion than baseline. Pt found to be in acute renal failure. CT head with no acute findings. CXR No acute cardiopulmonary disease. MBS 06/17/12 Mild oral phase dysphagia;Mild pharyngeal phase dysphagia;Mild  cervical esophageal phase dysphagia; cervical osteophytes at C2-C3, C3-C4, C4-C5, C5-C6 mildly impairing barium flow into esophagus (C5-C6 mostly impacting swallow). Dys 3 and thin liquids recommended.  Pt underwent MBS during this hospital course with recommendation for puree/thin diet with strict precautions.  SLP follow up to determine tolerance of po diet and readiness for dietary advancement.     Pertinent Vitals Pain Assessment:  (back pain) Pain Location: back Pain Descriptors / Indicators: Grimacing Pain Intervention(s): Monitored during session;Patient requesting pain meds-RN notified;Repositioned;Utilized relaxation techniques;Other (comment) (diversion techniques)  SLP Plan  Continue with current plan of care    Recommendations Diet recommendations: Dysphagia 1 (puree);Thin liquid Liquids provided via: Teaspoon Medication Administration: Crushed with puree Supervision: Full supervision/cueing for compensatory strategies;Staff to assist with self feeding Compensations: Slow rate;Small sips/bites Postural Changes and/or Swallow Maneuvers: Seated upright 90 degrees              Oral Care Recommendations: Oral care BID Follow up Recommendations:  (TBD) Plan: Continue with current plan of care    [place], [place] [facility] SLP [phone]

## 2014-05-17 LAB — GLUCOSE, CAPILLARY
GLUCOSE-CAPILLARY: 102 mg/dL — AB (ref 70–99)
GLUCOSE-CAPILLARY: 108 mg/dL — AB (ref 70–99)
Glucose-Capillary: 101 mg/dL — ABNORMAL HIGH (ref 70–99)
Glucose-Capillary: 117 mg/dL — ABNORMAL HIGH (ref 70–99)
Glucose-Capillary: 126 mg/dL — ABNORMAL HIGH (ref 70–99)

## 2014-05-17 LAB — BASIC METABOLIC PANEL
Anion gap: 13 (ref 5–15)
BUN: 61 mg/dL — ABNORMAL HIGH (ref 6–20)
CO2: 24 mmol/L (ref 22–32)
Calcium: 8 mg/dL — ABNORMAL LOW (ref 8.9–10.3)
Chloride: 102 mmol/L (ref 101–111)
Creatinine, Ser: 6.98 mg/dL — ABNORMAL HIGH (ref 0.61–1.24)
GFR, EST AFRICAN AMERICAN: 7 mL/min — AB (ref >60)
GFR, EST NON AFRICAN AMERICAN: 6 mL/min — AB (ref >60)
Glucose, Bld: 105 mg/dL — ABNORMAL HIGH (ref 70–99)
Potassium: 4.3 mmol/L (ref 3.5–5.1)
SODIUM: 139 mmol/L (ref 135–145)

## 2014-05-17 LAB — MAGNESIUM: Magnesium: 2 mg/dL (ref 1.7–2.4)

## 2014-05-17 MED ORDER — SODIUM CHLORIDE 0.9 % IV SOLN
250.0000 mg | INTRAVENOUS | Status: DC
  Administered 2014-05-17 – 2014-05-21 (×2): 250 mg via INTRAVENOUS
  Filled 2014-05-17 (×5): qty 20

## 2014-05-17 NOTE — Progress Notes (Signed)
Progress Note  DAR BAILE [name] DOB: 08-Mar-1925 DOA: 05/01/2014 PCP: Jenny Reichmann, MD  Admit HPI / Brief Narrative: 81 y.o. 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 In ED workup was significant for acute renal failure with a creatinine of 2, hypothermia with temperature of 92.4, urinalysis was negative, no acute finding on chest x-ray, blood cultures were sent, CT head with no acute findings.   Patient's crt continued to increase after admission to [facility], he remained anuric despite being +7000 mL positive, and was therefore transferred to [facility] for possible need of hemodialysis in near future. Nephrology discussed with family, plan is to do HD trial to see if functional status improved. Patient continue to be weak, no significant improvement. Further disposition per nephrologist.   HPI/Subjective: Seen during dialysis. He open eyes, follow some command. Goes back to sleep.   Assessment/Plan: Acute renal failure / anuric. -Nephrology following.  -patient initiated on temporary HD treatment -Patient remove/pull  HD catheter 4-26. -Family wants to try dialysis for now.  -had dialysis 4-30. Dialysis again 5-3.  -no family present today. Further discussion regarding goals of care need to happen. .   Constipation;  KUB negative. Received dulcolax suppository  Had BM.   Anemia;  Of acute illness, anemia of chronic diseases.  Received 2 units PRBC 4-30 ferritin 353 and iron level 23.  Hb at 9.  Acute encephalopathy -Fluctuates.  Awaiting to see improvement with dialysis. Renal following.  - ammonia level at 21.  -no significant improvement,   Aspiration PNA; Cough; started guaifenesin. Chest x ray: with RUL infiltrates.  Started on Augmentin 4-29. Day; 4.  Dysphagia; worsening dysphagia.  Speech consulted.  MRI negative for stroke.    SIRS -Patient meets SIRS  criteria on admission given his hypothermia, tachypnea; but not findings supporting sepsis -resolved.  -No convincing radiographic or clinical evidence of infection; will continue monitoring off antibiotics for now  Elevated LFTs -ultrasound showing 1 cm gallstone lodged in the gallbladder neck with no evidence of cholecystitis, CT abdomen pelvis without contrast with no acute finding. -No abdominal pain.  -LFTs continuing to trend down -will monitor for now  Pressure ulcers/sacral decubitus ulcer -Wound care following -continue following preventive measures -on air mattress   History of CVA -Continue aspirin 81 mg daily for secondary prevention -no new deficit -worsening dysphagia. Will check MRI.   Fungal rash in groin and abdominal fold areas - Treated with Diflucan, continue with nystatin powder and keep area clean and dry  Essential hypertension -Continue to hold lasix and Cozaar in setting of renal failure.  -Will order PRN Hydralazine.   Diabetes mellitus Type 2 controlled -Does not take any home medication, CBGs are controlled without insulin. -4/22 Hemoglobin A1c=6.5 -Lipid panel; LDL slightly elevated by 88 guidelines and HDL low will hold on adding additional medication at this time. Also patient with elevated LFT>   Hyperkalemia -Kayexalate 60 gm  2 doses -will monitor  Metabolic acidosis -will discontinue sodium bicarb.  -renal on board, will follow rec's  Back pain   -will continue PRN pain medications (Tramadol 50 mg QID PRN) -Unable to treat pain with NSAIDs or Tylenol secondary patient's renal and liver failure  Hypothermic -resolved  Code Status: DNR Family Communication: daughter updated 4-30 Disposition Plan: SNF  Consultants: Dr.Robert Doctor, hospital (nephrology) Dr. Rhea Pink (palliative care)  Procedure/Significant Events: 4/17 CT head without contrast;-no acute findings-small old  lacune infarcts the right base a ganglia and left  periventricular white matter.  4/17 ultrasound RUQ;1.0 cm gallstone lodged in the gallbladder neck with no evidence of cholecystitis.  4/18 CT abdomen pelvis without contrast;No acute intra-abdominal or intrapelvic abnormalities. -Minimal RIGHT pleural effusion and basilar atelectasis. -Cholelithiasis with otherwise unremarkable gallbladder.-Sigmoid diverticulosis. -Umbilical hernia containing fat. 4/18 echocardiogram;- Left ventricle: suggest that overall EF is probably normal. -(grade 1diastolic dysfunction). - Pulmonary arteries: PA peak pressure: 39 mm Hg (S).  Culture No growth appreciated in any culture.  Antibiotics: Zosyn 4/17 > >stopped 4/20 Vancomycin>> stopped4/17   DVT prophylaxis: Subcutaneous heparin  LINES / TUBES:       Objective: VITAL SIGNS: Temp: 98.6 F (37 C) (05/03 1046) Temp Source: Oral (05/03 1046) BP: 114/63 mmHg (05/03 1459) Pulse Rate: 77 (05/03 1459) SPO2; FIO2:   Intake/Output Summary (Last 24 hours) at 05/17/14 1531 Last data filed at 05/17/14 1459  Gross per 24 hour  Intake    220 ml  Output   2601 ml  Net  -2381 ml     Exam: General: No acute respiratory distress.  Lungs: bilateral ronchus.  Cardiovascular: Regular rate and rhythm, without murmur, gallops or rub normal S1 and S2 Abdomen: Nontender, nondistended, soft, bowel sounds positive, no rebound,  Extremities: No significant cyanosis, clubbing. Bilateral +1 pitting edema to knees.    Data Reviewed: Basic Metabolic Panel:  Recent Labs Lab 05/11/14 1058 05/12/14 0530 05/13/14 1436 05/14/14 1500 05/15/14 0734 05/16/14 0613 05/17/14 0642  NA 140 138 138 138 139 140 139  K 4.7 4.6 3.9 4.1 3.8 4.1 4.3  CL 105 101 101 101 103 104 102  CO2 23 23 25 26 25 27 24   GLUCOSE 124* 126* 106* 103* 88 98 105*  BUN 91* 97* 66* 77* 44* 53* 61*  CREATININE 8.89* 9.97* 7.04* 7.98* 5.48* 6.38* 6.98*  CALCIUM 8.2* 8.2* 7.9* 7.7* 7.8* 7.9* 8.0*  MG  --  2.0 2.1 2.1 1.8 2.0 2.0    PHOS 8.8* 9.2*  --   --   --   --   --    Liver Function Tests:  Recent Labs Lab 05/11/14 1058 05/12/14 0530  ALBUMIN 2.0* 2.1*    Recent Labs Lab 05/11/14 1345  AMMONIA 21   CBC:  Recent Labs Lab 05/11/14 1058 05/13/14 1436 05/14/14 1500 05/15/14 1507 05/16/14 0613  WBC 9.5 9.9 9.5 10.4 10.5  HGB 8.7* 7.5* 7.0* 9.1* 9.1*  HCT 25.8* 22.4* 20.8* 27.3* 27.2*  MCV 78.2 78.9 80.9 81.0 81.2  PLT 239 214 210 192 214   CBG:  Recent Labs Lab 05/16/14 1641 05/16/14 2041 05/17/14 0010 05/17/14 0439 05/17/14 0746  GLUCAP 123* 112* 108* 102* 101*    No results found for this or any previous visit (from the past 240 hour(s)).   Studies:  Recent x-ray studies have been reviewed in detail by the Attending Physician  Scheduled Meds:  Scheduled Meds: . sodium chloride   Intravenous Once  . amoxicillin-clavulanate  500 mg Oral Q24H  . aspirin EC  81 mg Oral Daily  . bisacodyl  10 mg Rectal Once  . brimonidine  1 drop Right Eye BID  . docusate sodium  100 mg Oral BID  . feeding supplement (NEPRO CARB STEADY)  237 mL Oral BID  . feeding supplement (PRO-STAT SUGAR FREE 64)  30 mL Oral Daily  . ferric gluconate (FERRLECIT/NULECIT) IV  250 mg Intravenous Q T,Th,Sa-HD  . heparin  5,000 Units Subcutaneous 3 times  per day  . multivitamin  1 tablet Oral QHS  . nystatin   Topical BID  . sodium chloride  3 mL Intravenous Q12H    Time spent on care of this patient: 30 mins   Regalado, Belkys A , MD [phone]  If 7PM-7AM, please contact night-coverage [email or web address] Password [email or web address] 05/17/2014, 3:31 PM   LOS: 16 days

## 2014-05-17 NOTE — Progress Notes (Signed)
[facility] Progress Note   Subjective:   HD #3 on Saturday, 4 hours, 2.7 L UF, no complications (Transfused 2 units during that HD) Getting HD today No evidence of functional recovery so far Could not get pt to engage about dialysis but has stated in past did not want long term    Filed Vitals:   05/17/14 1046 05/17/14 1052 05/17/14 1120 05/17/14 1153  BP: 124/62 130/64 132/59 130/67  Pulse: 76 76 75 79  Temp: 98.6 F (37 C)     TempSrc: Oral     Resp: 16     Height:      Weight:      SpO2: 99%      Exam: Elderly AAM, no distress, frail, lethargic Right IJ HD cath Chest clear bilat Regular S1S2 No S3  no MRG Abd obese, NTND, no mass or ascites, no HSM 1+ LE edema bilat Neuro lethargic responsive, no asterixis, weak SKIN: R IJ TDC bandaged    Recent Labs Lab 05/11/14 1058 05/12/14 0530  05/15/14 0734 05/16/14 0613 05/17/14 0642  NA 140 138  < > 139 140 139  K 4.7 4.6  < > 3.8 4.1 4.3  CL 105 101  < > 103 104 102  CO2 23 23  < > 25 27 24   GLUCOSE 124* 126*  < > 88 98 105*  BUN 91* 97*  < > 44* 53* 61*  CREATININE 8.89* 9.97*  < > 5.48* 6.38* 6.98*  CALCIUM 8.2* 8.2*  < > 7.8* 7.9* 8.0*  PHOS 8.8* 9.2*  --   --   --   --   < > = values in this interval not displayed.  Recent Labs Lab 05/11/14 1058 05/12/14 0530  ALBUMIN 2.0* 2.1*    Recent Labs Lab 05/14/14 1500 05/15/14 1507 05/16/14 0613  WBC 9.5 10.4 10.5  HGB 7.0* 9.1* 9.1*  HCT 20.8* 27.3* 27.2*  MCV 80.9 81.0 81.2  PLT 210 192 214   . sodium chloride   Intravenous Once  . amoxicillin-clavulanate  500 mg Oral Q24H  . aspirin EC  81 mg Oral Daily  . bisacodyl  10 mg Rectal Once  . brimonidine  1 drop Right Eye BID  . docusate sodium  100 mg Oral BID  . feeding supplement (NEPRO CARB STEADY)  237 mL Oral BID  . feeding supplement (PRO-STAT SUGAR FREE 64)  30 mL Oral Daily  . ferric gluconate (FERRLECIT/NULECIT) IV  250 mg Intravenous Q T,Th,Sa-HD  . heparin  5,000 Units  Subcutaneous 3 times per day  . multivitamin  1 tablet Oral QHS  . nystatin   Topical BID  . sodium chloride  3 mL Intravenous Q12H     acetaminophen, acetaminophen, bisacodyl, heparin, heparin, hydrALAZINE, MUSCLE RUB, ondansetron **OR** ondansetron (ZOFRAN) IV, senna-docusate, sorbitol  Assessment: 1. AKI shock/ ARB  1. Last creat 1.01 in Jan 2016 2. HD #1 4/24, #2 4/28, #3 4/30, #4 today (5/3) 3. Dialysis initiated as a temporary measure to try to facilitate functional improvements, if ineffective the patient was very clear that he never wished to receive long-term hemodialysis 4. See family conversation in Note on 4/27 5. Unable to talk with family today.   6. To this point he has had little global/functional improvements with the onset of dialysis. 7. Cont to follow closely for recovery - did have 600 UOP yesterday though creatinine still rising in between HD treatments 8. HD today  2. Shock/ low BP resolved, w/u  neg abx stopped 3. HTN home meds on hold 4. Blind L eye 5. DM 6. Hx falls / debility 7. [place], MD [facility] [phone] Pager 05/17/2014, 12:10 PM

## 2014-05-17 NOTE — Procedures (Signed)
I have personally attended this patient's dialysis session.   HD #4 today No evidence yet of recovering function 2K bath with K of 4.3 Temp IJ trialyisis catheter  Jamal Maes, MD [facility] [phone] Pager 05/17/2014, 2:24 PM

## 2014-05-17 NOTE — Plan of Care (Signed)
Problem: Phase II Progression Outcomes Goal: Tol increased activity, up in chair for at least 4 hrs/HD pt Outcome: Progressing To HD in recliner today. Goal: Tolerating diet Outcome: Progressing Decreased appetite.

## 2014-05-17 NOTE — Progress Notes (Signed)
Patient up in recliner to go to dialysis.  Joellen Jersey, RN.

## 2014-05-17 NOTE — Progress Notes (Addendum)
NUTRITION FOLLOW UP  DOCUMENTATION CODES Per approved criteria  -Obesity Unspecified   INTERVENTION: Continue Nepro Shake po BID, each supplement provides 425 kcal and 19 grams protein.  Continue 30 ml Prostat po once daily, each supplement provides 100 kcal and 15 grams of protein.   Encourage adequate PO intake.   NUTRITION DIAGNOSIS: Increased nutrient needs related to wound healing as evidenced by estimated nutrition needs; onging  Goal: Pt to meet >/= 90% of their estimated nutrition needs; progressing  Monitor:  PO & supplemental intake, weight, labs, I/O's  81 y.o. male  Admitting Dx: Acute renal failure syndrome  ASSESSMENT: [name] with PMH of diabetes mellitus (not taking any home medication), history of CVA, hyperlipidemia, lives at home with family; daughter found him sitting on a chair at 4 AM in the kitchen this morning, appears to be more confused, weak could not stand up, so she brought him to ED, in ED workup was significant for acute renal failure with a creatinine of 2, hypothermia with temperature of 92.4.  Procedure (4/26): Insertion of Central Venous Catheter Indications: dialysis  Pt pulled out HD cath on 05/09/16 and was reinserted by IR on 05/12/14.  Pt off unit at time of visit. He continues with poor prognosis; palliative care following for goals of care. Family would like to continue temporary HD. Staff report pt is very lethargic. SLP following; pt is a laborious feeder and continues as a high aspiration risk. He is on a dysphagia 1 diet with thin liquids (1200 ml fluid restriction, liquids administered by spoon only). Intake remains poor; PO: 25-50%. Pt remains on Nepro and Prostat supplements, however, has been refusing per MAR. Also on renal MVI.   Labs reviewed. BUN/Creat: 60/6.98, Calcium: 8.2 Phos: 8.2, Glucose: 105. CBGS: 101-108.   Height: Ht Readings from Last 1 Encounters:  05/03/14 5\' 6"  (1.676 m)    Weight: Wt Readings from Last  1 Encounters:  05/15/14 258 lb 2.5 oz (117.1 kg)  Admit weight (4/17) 137 lbs *Weight trending up)* Net I/O's +11 L since admission  BMI:  Body mass index is 41.69 kg/(m^2).  Re-Estimated Nutritional Needs: Kcal: 1750-1950 Protein: 95-105 gm Fluid: Per MD  Skin: Stage II pressure ulcer on L foot, +1 generalized edema  Diet Order: DIET - DYS 1 Room service appropriate?: Yes; Fluid consistency:: Thin; Fluid restriction:: 1200 mL Fluid   Intake/Output Summary (Last 24 hours) at 05/17/14 1459 Last data filed at 05/17/14 1400  Gross per 24 hour  Intake    220 ml  Output    600 ml  Net   -380 ml    Last BM: 05/14/14  Labs:   Recent Labs Lab 05/11/14 1058 05/12/14 0530  05/15/14 0734 05/16/14 0613 05/17/14 0642  NA 140 138  < > 139 140 139  K 4.7 4.6  < > 3.8 4.1 4.3  CL 105 101  < > 103 104 102  CO2 23 23  < > 25 27 24   BUN 91* 97*  < > 44* 53* 61*  CREATININE 8.89* 9.97*  < > 5.48* 6.38* 6.98*  CALCIUM 8.2* 8.2*  < > 7.8* 7.9* 8.0*  MG  --  2.0  < > 1.8 2.0 2.0  PHOS 8.8* 9.2*  --   --   --   --   GLUCOSE 124* 126*  < > 88 98 105*  < > = values in this interval not displayed.  CBG (last 3)   Recent Labs  05/17/14  0010 05/17/14 0439 05/17/14 0746  GLUCAP 108* 102* 101*    Scheduled Meds: . sodium chloride   Intravenous Once  . amoxicillin-clavulanate  500 mg Oral Q24H  . aspirin EC  81 mg Oral Daily  . bisacodyl  10 mg Rectal Once  . brimonidine  1 drop Right Eye BID  . docusate sodium  100 mg Oral BID  . feeding supplement (NEPRO CARB STEADY)  237 mL Oral BID  . feeding supplement (PRO-STAT SUGAR FREE 64)  30 mL Oral Daily  . ferric gluconate (FERRLECIT/NULECIT) IV  250 mg Intravenous Q T,Th,Sa-HD  . heparin  5,000 Units Subcutaneous 3 times per day  . multivitamin  1 tablet Oral QHS  . nystatin   Topical BID  . sodium chloride  3 mL Intravenous Q12H    Continuous Infusions:    Past Medical History  Diagnosis Date  . Diabetes mellitus without  complication   . Hypertension   . Blind left eye     History reviewed. No pertinent past surgical history.   Curtis Brock, RD, LDN, CDE Pager: [phone] After hours Pager: [phone]

## 2014-05-18 ENCOUNTER — Inpatient Hospital Stay (HOSPITAL_COMMUNITY): Payer: Medicare Other

## 2014-05-18 DIAGNOSIS — E119 Type 2 diabetes mellitus without complications: Secondary | ICD-10-CM

## 2014-05-18 LAB — CBC
HEMATOCRIT: 28.7 % — AB (ref 39.0–52.0)
Hemoglobin: 9.4 g/dL — ABNORMAL LOW (ref 13.0–17.0)
MCH: 26.9 pg (ref 26.0–34.0)
MCHC: 32.8 g/dL (ref 30.0–36.0)
MCV: 82 fL (ref 78.0–100.0)
PLATELETS: 187 10*3/uL (ref 150–400)
RBC: 3.5 MIL/uL — ABNORMAL LOW (ref 4.22–5.81)
RDW: 14.2 % (ref 11.5–15.5)
WBC: 13.3 10*3/uL — ABNORMAL HIGH (ref 4.0–10.5)

## 2014-05-18 LAB — HEPATIC FUNCTION PANEL
ALK PHOS: 56 U/L (ref 38–126)
ALT: 17 U/L (ref 17–63)
AST: 35 U/L (ref 15–41)
Albumin: 2 g/dL — ABNORMAL LOW (ref 3.5–5.0)
BILIRUBIN INDIRECT: 0.5 mg/dL (ref 0.3–0.9)
Bilirubin, Direct: 0.2 mg/dL (ref 0.1–0.5)
Total Bilirubin: 0.7 mg/dL (ref 0.3–1.2)
Total Protein: 5.6 g/dL — ABNORMAL LOW (ref 6.5–8.1)

## 2014-05-18 LAB — RENAL FUNCTION PANEL
ALBUMIN: 2.1 g/dL — AB (ref 3.5–5.0)
ANION GAP: 12 (ref 5–15)
BUN: 29 mg/dL — ABNORMAL HIGH (ref 6–20)
CHLORIDE: 102 mmol/L (ref 101–111)
CO2: 24 mmol/L (ref 22–32)
Calcium: 8 mg/dL — ABNORMAL LOW (ref 8.9–10.3)
Creatinine, Ser: 4.12 mg/dL — ABNORMAL HIGH (ref 0.61–1.24)
GFR calc Af Amer: 14 mL/min — ABNORMAL LOW (ref >60)
GFR calc non Af Amer: 12 mL/min — ABNORMAL LOW (ref >60)
Glucose, Bld: 116 mg/dL — ABNORMAL HIGH (ref 70–99)
POTASSIUM: 4 mmol/L (ref 3.5–5.1)
Phosphorus: 4.1 mg/dL (ref 2.5–4.6)
Sodium: 138 mmol/L (ref 135–145)

## 2014-05-18 LAB — GLUCOSE, CAPILLARY
GLUCOSE-CAPILLARY: 111 mg/dL — AB (ref 70–99)
GLUCOSE-CAPILLARY: 113 mg/dL — AB (ref 70–99)
GLUCOSE-CAPILLARY: 117 mg/dL — AB (ref 70–99)
GLUCOSE-CAPILLARY: 122 mg/dL — AB (ref 70–99)
Glucose-Capillary: 151 mg/dL — ABNORMAL HIGH (ref 70–99)
Glucose-Capillary: 121 mg/dL — ABNORMAL HIGH (ref 70–99)
Glucose-Capillary: 163 mg/dL — ABNORMAL HIGH (ref 70–99)

## 2014-05-18 LAB — MAGNESIUM: MAGNESIUM: 1.9 mg/dL (ref 1.7–2.4)

## 2014-05-18 NOTE — Progress Notes (Signed)
Physical Therapy Treatment Patient Details Name: Curtis Brock MRN: [id number] DOB: July 16, 1925 Today's Date: 05/18/2014    History of Present Illness 81 yo male admitted with SIRS, sepsis, AMS. as of 4/24, pt with temporary femoral HD catheter; Temporary femoral HD catheter removed 4/26, and PT reordered; Hx of DM,CVA    PT Comments    More responsive and participatory today, and able to perform UE and LE therex in chair; More alert with eyes open in the chair; Recommend he have time OOB in chair (using Maximove) daily  Follow Up Recommendations  SNF;Supervision/Assistance - 24 hour     Equipment Recommendations  Other (comment) (to be determined, based on Goals of Care)    Recommendations for Other Services OT consult     Precautions / Restrictions Precautions Precautions: Fall    Mobility  Bed Mobility Overal bed mobility: Needs Assistance Bed Mobility: Rolling Rolling: Mod assist         General bed mobility comments: Assisted pt in rolling for bed mobility/maximove pad placement; cues for technique, and noted pt participation/reaching for rails with hand over hand assist  Transfers Overall transfer level: Needs assistance Equipment used:  (Maximove lift) Transfers:  (Maximove lift for OOB) Sit to Stand: From elevated surface;+2 physical assistance;Max assist;Mod assist         General transfer comment: Employed Maximove for safe out of bed transfer; Tolerated being lifted in the lift pad well  Ambulation/Gait                 Stairs            Wheelchair Mobility    Modified Rankin (Stroke Patients Only)       Balance   Sitting-balance support: Bilateral upper extremity supported Sitting balance-Leahy Scale: Zero (approaching Poor) Sitting balance - Comments: Worked on shifting from supported sitting in recliner to unsupported sitting in recliner; required max assist to flex hips and lean forward Postural control: Right lateral  lean;Posterior lean                          Cognition Arousal/Alertness: Awake/alert (eyes still closed, but much more responsive) Behavior During Therapy: WFL for tasks assessed/performed                 Problem Solving: Slow processing;Decreased initiation General Comments: Eyes closed majority of time while laying in bed; Eyes more spontaneously opened sitting upright in recliner    Exercises General Exercises - Upper Extremity Shoulder Flexion: AAROM;Both;5 reps;Seated (cued to give the "touchdown" referee sign) General Exercises - Lower Extremity Long Arc Quad: AROM;Both;10 reps;Seated (alternating; max support posteriorly) Hip Flexion/Marching: AROM;Both;10 reps    General Comments        Pertinent Vitals/Pain Pain Assessment: Faces Faces Pain Scale: Hurts little more Pain Location: LEs with L rolling Pain Descriptors / Indicators: Grimacing Pain Intervention(s): Repositioned    Home Living                      Prior Function            PT Goals (current goals can now be found in the care plan section) Acute Rehab PT Goals Patient Stated Goal: none stated PT Goal Formulation: Patient unable to participate in goal setting Time For Goal Achievement: 05/25/14 Potential to Achieve Goals: Fair Progress towards PT goals: Progressing toward goals (improving ability to participate)    Frequency  Min 3X/week    PT  Plan Current plan remains appropriate    Co-evaluation             End of Session   Activity Tolerance: Patient tolerated treatment well Patient left: in chair;with call bell/phone within reach;with chair alarm set     Time: 1411-1432 PT Time Calculation (min) (ACUTE ONLY): 21 min  Charges:  $Therapeutic Exercise: 8-22 mins                    G Codes:      Roney Marion Hamff 05/18/2014, 4:09 PM  Roney Marion, [place] Pager [phone] Office [phone]

## 2014-05-18 NOTE — Progress Notes (Signed)
[facility] Progress Note   Subjective:   Had a 4th HD yesterday for AKI Made a liter of urine!!! Pleasant this am - "will you get me some water?"   Filed Vitals:   05/17/14 1504 05/17/14 1607 05/17/14 2139 05/18/14 0420  BP:  129/50 146/64 147/56  Pulse:  83 79 90  Temp:  98.2 F (36.8 C) 97.7 F (36.5 C) 97.5 F (36.4 C)  TempSrc:  Oral Oral Oral  Resp:  17 16 18   Height:      Weight: 109.498 kg (241 lb 6.4 oz)     SpO2:  100% 98% 96%   I/O last 3 completed shifts: In: 370 [P.O.:370] Out: 3051 [Urine:1050; Other:2001]     Exam: Elderly AAM, no distress, frail, a little more alert than I have seen Right IJ HD cath Chest clear bilaterally Regular S1S2 No S3  no MRG Abd obese, NTND, no mass or ascites, no HSM 1+ LE edema bilat. Feet in boots Neuro Weak but more awake and talking some to me today SKIN: R IJ TDC bandaged (tunnelled 4/28)   Recent Labs Lab 05/11/14 1058 05/12/14 0530  05/16/14 0613 05/17/14 0642 05/18/14 0437  NA 140 138  < > 140 139 138  K 4.7 4.6  < > 4.1 4.3 4.0  CL 105 101  < > 104 102 102  CO2 23 23  < > 27 24 24   GLUCOSE 124* 126*  < > 98 105* 116*  BUN 91* 97*  < > 53* 61* 29*  CREATININE 8.89* 9.97*  < > 6.38* 6.98* 4.12*  CALCIUM 8.2* 8.2*  < > 7.9* 8.0* 8.0*  PHOS 8.8* 9.2*  --   --   --  4.1  < > = values in this interval not displayed.  Recent Labs Lab 05/11/14 1058 05/12/14 0530 05/18/14 0437  AST  --   --  35  ALT  --   --  17  ALKPHOS  --   --  56  BILITOT  --   --  0.7  PROT  --   --  5.6*  ALBUMIN 2.0* 2.1* 2.0*  2.1*    Recent Labs Lab 05/15/14 1507 05/16/14 0613 05/18/14 0437  WBC 10.4 10.5 13.3*  HGB 9.1* 9.1* 9.4*  HCT 27.3* 27.2* 28.7*  MCV 81.0 81.2 82.0  PLT 192 214 187    Medications . sodium chloride   Intravenous Once  . amoxicillin-clavulanate  500 mg Oral Q24H  . aspirin EC  81 mg Oral Daily  . bisacodyl  10 mg Rectal Once  . brimonidine  1 drop Right Eye BID  .  docusate sodium  100 mg Oral BID  . feeding supplement (NEPRO CARB STEADY)  237 mL Oral BID  . feeding supplement (PRO-STAT SUGAR FREE 64)  30 mL Oral Daily  . ferric gluconate (FERRLECIT/NULECIT) IV  250 mg Intravenous Q T,Th,Sa-HD  . heparin  5,000 Units Subcutaneous 3 times per day  . multivitamin  1 tablet Oral QHS  . nystatin   Topical BID  . sodium chloride  3 mL Intravenous Q12H     acetaminophen, acetaminophen, bisacodyl, hydrALAZINE, MUSCLE RUB, ondansetron **OR** ondansetron (ZOFRAN) IV, senna-docusate, sorbitol  Assessment: 1. AKI shock/ ARB  1. Last creat PTA 1.01 in Jan 2016 2. HD #1 4/24, #2 4/28, #3 4/30, #4  (5/3) 3. Dialysis initiated as a temporary measure to try to facilitate functional improvements, if ineffective the patient was very clear that he never  wished to receive long-term hemodialysis 4. See family conversation in Note on 4/27 5. Unable to talk with family today.   6. To this point he has had little global/functional improvements with the onset of dialysis. 7. Cont to follow closely for recovery - did have 1000 cc UOP yesterday (despite HD)   8. Will look at labs and UOP in the AM before writing additional orders - may be starting to see some recovery  2. Shock/ low BP resolved, w/u neg abx stopped 3. HTN home meds on hold 4. Blind L eye 5. DM 6. Hx falls / debility 7. [place], MD [facility] [phone] Pager 05/18/2014, 7:54 AM

## 2014-05-18 NOTE — Progress Notes (Addendum)
Progress Note  Curtis Brock [name] DOB: [birth year] DOA: 05/01/2014 PCP: Jenny Reichmann, MD  Admit HPI / Brief Narrative: 81 y.o. 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 In ED workup was significant for acute renal failure with a creatinine of 2, hypothermia with temperature of 92.4, urinalysis was negative, no acute finding on chest x-ray, blood cultures were sent, CT head with no acute findings.   Patient's crt continued to increase after admission to [facility], he remained anuric despite being +7000 mL positive, and was therefore transferred to [facility] for possible need of hemodialysis in near future. Nephrology discussed with family, AND started on HD, Palliative following too   HPI/Subjective: No complaints this am, " i feel ok", no dyspnea, mentation improving per family  Assessment/Plan: Acute renal failure / anuric. -baseline creatinine 1.0 in 1/16 -Nephrology following.  -patient initiated on temporary HD treatment, s/p HD 4/24, 4/28, 4/30 and 5/3 -urine output improving, hopefully starting to turn the corner, per Renal  -not felt to be a long term HD candidate -Palliative following too -continue to monitor urine output, labs  Constipation;  -resolved with suppositary  Anemia;  -due to acute illness, anemia of chronic diseases.  -Received 2 units PRBC 4-30 -ferritin 353 and iron level 23.  -stable  Acute encephalopathy - some modest improvement, not significant though, felt to be metabolic from AKI, Sepsis - MRI brain 4/29 normal - ammonia level at 21.   Aspiration PNA; Cough; started guaifenesin. Chest x ray: with RUL infiltrates.  -Started on Augmentin 4-29, now day 5 -s/p speech eval, with dysphagia on D1 diet  Dysphagia; worsening  Speech consulted.  MRI negative for stroke.   -speech following, s/o eval, now on D1 diet  SIRS -Patient meets SIRS  criteria on admission given his hypothermia, tachypnea; but not findings supporting sepsis -resolved.  -No convincing radiographic or clinical evidence of infection; continue monitoring off antibiotics  Elevated LFTs -ultrasound showing 1 cm gallstone lodged in the gallbladder neck on admission with no evidence of cholecystitis, CT abdomen pelvis without contrast with no acute finding. -No abdominal pain.  -LFTs normal now -will repeat RUQ [place]  Pressure ulcers/sacral decubitus ulcer -Wound care following -airoverlay mattress   History of CVA -Continue aspirin 81 mg daily for secondary prevention -no new deficit -MRI unremarkable  Fungal rash in groin and abdominal fold areas - Treated with Diflucan, continue with nystatin powder and keep area clean and dry  Essential hypertension -Continue to hold lasix and Cozaar in setting of renal failure.  -stable  Diabetes mellitus Type 2 controlled -Does not take any home medication, CBGs are controlled without insulin. -4/22 Hemoglobin A1c=6.5  -Lipid panel; LDL slightly elevated by 88 guidelines and HDL low will hold on adding additional medication at this time. Also patient with elevated LFT>   Hyperkalemia -resolved with Kayexalate 2 doses -monitor  Metabolic acidosis -reslved with HD  Back pain   -will continue PRN pain medications (Tramadol 50 mg QID PRN) -Unable to treat pain with NSAIDs or Tylenol secondary patient's renal and liver failure  Hypothermia -resolved  Code Status: DNR Family Communication: no family at bedside Disposition Plan: SNF  Consultants: Dr.Robert Doctor, hospital (nephrology) Dr. Rhea Pink (palliative care)  Procedure/Significant Events: 4/17 CT head without contrast;-no acute findings-small old lacune infarcts the right base a ganglia and left periventricular white matter.  4/17 ultrasound RUQ;1.0 cm gallstone lodged in the gallbladder  neck with no evidence of cholecystitis.  4/18 CT abdomen  pelvis without contrast;No acute intra-abdominal or intrapelvic abnormalities. -Minimal RIGHT pleural effusion and basilar atelectasis. -Cholelithiasis with otherwise unremarkable gallbladder.-Sigmoid diverticulosis. -Umbilical hernia containing fat. 4/18 echocardiogram;- Left ventricle: suggest that overall EF is probably normal. -(grade 1diastolic dysfunction). - Pulmonary arteries: PA peak pressure: 39 mm Hg (S).  Culture No growth appreciated in any culture.  Antibiotics: Zosyn 4/17 > >stopped 4/20 Vancomycin>> stopped4/17   DVT prophylaxis: Subcutaneous heparin  LINES / TUBES:       Objective: VITAL SIGNS: Temp: 97.5 F (36.4 C) (05/04 0420) Temp Source: Oral (05/04 0420) BP: 147/56 mmHg (05/04 0420) Pulse Rate: 90 (05/04 0420) SPO2; FIO2:   Intake/Output Summary (Last 24 hours) at 05/18/14 1320 Last data filed at 05/18/14 1156  Gross per 24 hour  Intake    210 ml  Output   2626 ml  Net  -2416 ml     Exam: General:Alert, awake and oriented to self and place, no distress.  Lungs: CTAB Cardiovascular: Regular rate and rhythm, without murmur, gallops or rub normal S1 and S2 Abdomen: Nontender, nondistended, soft, bowel sounds positive, no rebound,  Extremities: No significant cyanosis, clubbing. Bilateral +1 pitting edema   Data Reviewed: Basic Metabolic Panel:  Recent Labs Lab 05/12/14 0530  05/14/14 1500 05/15/14 0734 05/16/14 0613 05/17/14 0642 05/18/14 0437  NA 138  < > 138 139 140 139 138  K 4.6  < > 4.1 3.8 4.1 4.3 4.0  CL 101  < > 101 103 104 102 102  CO2 23  < > 26 25 27 24 24   GLUCOSE 126*  < > 103* 88 98 105* 116*  BUN 97*  < > 77* 44* 53* 61* 29*  CREATININE 9.97*  < > 7.98* 5.48* 6.38* 6.98* 4.12*  CALCIUM 8.2*  < > 7.7* 7.8* 7.9* 8.0* 8.0*  MG 2.0  < > 2.1 1.8 2.0 2.0 1.9  PHOS 9.2*  --   --   --   --   --  4.1  < > = values in this interval not displayed. Liver Function Tests:  Recent Labs Lab 05/12/14 0530 05/18/14 0437   AST  --  35  ALT  --  17  ALKPHOS  --  56  BILITOT  --  0.7  PROT  --  5.6*  ALBUMIN 2.1* 2.0*  2.1*    Recent Labs Lab 05/11/14 1345  AMMONIA 21   CBC:  Recent Labs Lab 05/13/14 1436 05/14/14 1500 05/15/14 1507 05/16/14 0613 05/18/14 0437  WBC 9.9 9.5 10.4 10.5 13.3*  HGB 7.5* 7.0* 9.1* 9.1* 9.4*  HCT 22.4* 20.8* 27.3* 27.2* 28.7*  MCV 78.9 80.9 81.0 81.2 82.0  PLT 214 210 192 214 187   CBG:  Recent Labs Lab 05/17/14 1951 05/17/14 2343 05/18/14 0401 05/18/14 0735 05/18/14 1145  GLUCAP 126* 122* 117* 113* 151*    No results found for this or any previous visit (from the past 240 hour(s)).   Studies:  Recent x-ray studies have been reviewed in detail by the Attending Physician  Scheduled Meds:  Scheduled Meds: . sodium chloride   Intravenous Once  . amoxicillin-clavulanate  500 mg Oral Q24H  . aspirin EC  81 mg Oral Daily  . bisacodyl  10 mg Rectal Once  . brimonidine  1 drop Right Eye BID  . docusate sodium  100 mg Oral BID  . feeding supplement (NEPRO CARB STEADY)  237 mL Oral BID  .  feeding supplement (PRO-STAT SUGAR FREE 64)  30 mL Oral Daily  . ferric gluconate (FERRLECIT/NULECIT) IV  250 mg Intravenous Q T,Th,Sa-HD  . heparin  5,000 Units Subcutaneous 3 times per day  . multivitamin  1 tablet Oral QHS  . nystatin   Topical BID  . sodium chloride  3 mL Intravenous Q12H    Time spent on care of this patient: 30 mins   Domenic Polite , MD [phone]  If 7PM-7AM, please contact night-coverage [email or web address] Password [email or web address] 05/18/2014, 1:20 PM   LOS: 17 days

## 2014-05-18 NOTE — Clinical Social Work Note (Signed)
Call made to [facility], patient's facility preference to provide and update on patient. Message left and CSW will f/u with facility admissions director on 5/5.  [place] Spainhower Givens, MSW, LCSW Licensed Clinical Social Worker [place] [phone]

## 2014-05-19 DIAGNOSIS — R7989 Other specified abnormal findings of blood chemistry: Secondary | ICD-10-CM

## 2014-05-19 LAB — RENAL FUNCTION PANEL
Albumin: 2 g/dL — ABNORMAL LOW (ref 3.5–5.0)
Anion gap: 10 (ref 5–15)
BUN: 41 mg/dL — ABNORMAL HIGH (ref 6–20)
CHLORIDE: 106 mmol/L (ref 101–111)
CO2: 25 mmol/L (ref 22–32)
Calcium: 7.9 mg/dL — ABNORMAL LOW (ref 8.9–10.3)
Creatinine, Ser: 4.93 mg/dL — ABNORMAL HIGH (ref 0.61–1.24)
GFR calc non Af Amer: 10 mL/min — ABNORMAL LOW (ref >60)
GFR, EST AFRICAN AMERICAN: 11 mL/min — AB (ref >60)
Glucose, Bld: 130 mg/dL — ABNORMAL HIGH (ref 70–99)
PHOSPHORUS: 4.4 mg/dL (ref 2.5–4.6)
Potassium: 3.7 mmol/L (ref 3.5–5.1)
SODIUM: 141 mmol/L (ref 135–145)

## 2014-05-19 LAB — GLUCOSE, CAPILLARY
GLUCOSE-CAPILLARY: 122 mg/dL — AB (ref 70–99)
GLUCOSE-CAPILLARY: 107 mg/dL — AB (ref 70–99)
Glucose-Capillary: 132 mg/dL — ABNORMAL HIGH (ref 70–99)
Glucose-Capillary: 140 mg/dL — ABNORMAL HIGH (ref 70–99)
Glucose-Capillary: 129 mg/dL — ABNORMAL HIGH (ref 70–99)

## 2014-05-19 LAB — MAGNESIUM: Magnesium: 1.8 mg/dL (ref 1.7–2.4)

## 2014-05-19 NOTE — Progress Notes (Addendum)
Progress Note  Curtis Brock [name] DOB: 08-23-1925 DOA: 05/01/2014 PCP: Jenny Reichmann, MD  Admit HPI / Brief Narrative: 81 y.o. 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 In ED workup was significant for acute renal failure with a creatinine of 2, hypothermia with temperature of 92.4, urinalysis was negative, no acute finding on chest x-ray, blood cultures were sent, CT head with no acute findings.   Patient's crt continued to increase after admission to [facility], he remained anuric despite being +7000 mL positive, and was therefore transferred to [facility] for possible need of hemodialysis in near future. Nephrology discussed with family, AND started on HD, Palliative following too   HPI/Subjective: Feels well, no dyspnea, eating better  Assessment/Plan: Acute renal failure / anuric. -baseline creatinine 1.0 in 1/16 -Nephrology following.  -patient initiated on temporary HD treatment, s/p HD 4/24, 4/28, 4/30 and 5/3 -urine output 400 yest and 1L the day before, hopefully starting to turn the corner, per Renal  -not felt to be a long term HD candidate -Palliative following too -continue to monitor urine output, labs  Constipation;  -resolved with suppositary  Anemia;  -due to acute illness, anemia of chronic diseases.  -Received 2 units PRBC 4-30 -ferritin 353 and iron level 23.  -stable  Acute encephalopathy - with modest improvement, felt to be metabolic from AKI, SIRS - MRI brain 4/29 normal - ammonia level at 21.   Aspiration PNA; Cough; started guaifenesin. Chest x ray: with RUL infiltrates.  -Started on Augmentin 4-29, now day 6/7 -s/p speech eval, with dysphagia on D1 diet  Dysphagia; worsening  -Speech consulted.  MRI negative for stroke.   -speech following, s/o eval, now on D1 diet  SIRS -Patient meets SIRS criteria on admission given his hypothermia,  tachypnea; but not findings supporting sepsis -resolved.  -No convincing radiographic or clinical evidence of infection; continue monitoring off antibiotics  Elevated LFTs -ultrasound showing 1 cm gallstone lodged in the gallbladder neck on admission with no evidence of cholecystitis, CT abdomen pelvis without contrast with no acute finding. -No abdominal pain.  -LFTs normal now -repeat RUQ [place] with cholelithiasis without Gb wall thickening or biliary dilatation  Pressure ulcers/sacral decubitus ulcer -Wound care following -airoverlay mattress   History of CVA -Continue aspirin 81 mg daily for secondary prevention -no new deficit -MRI unremarkable  Fungal rash in groin and abdominal fold areas - Treated with Diflucan, continue with nystatin powder and keep area clean and dry  Essential hypertension -Continue to hold lasix and Cozaar in setting of renal failure.  -stable  Diabetes mellitus Type 2 controlled -Does not take any home medication, CBGs are controlled without insulin. -4/22 Hemoglobin A1c=6.5  -Lipid panel; LDL slightly elevated by 88 guidelines and HDL low will hold on adding additional medication at this time. Also patient with elevated LFT>   Hyperkalemia -resolved with Kayexalate 2 doses -monitor  Metabolic acidosis -reslved with HD  Back pain   -will continue PRN pain medications (Tramadol 50 mg QID PRN) -Unable to treat pain with NSAIDs or Tylenol secondary patient's renal and liver failure  Hypothermia -resolved  DVT proph: Hep Sq  Code Status: DNR Family Communication: daughter at bedside Disposition Plan: SNF  Consultants: Dr.Robert Doctor, hospital (nephrology) Dr. Rhea Pink (palliative care)  Procedure/Significant Events: 4/17 CT head without contrast;-no acute findings-small old lacune infarcts the right base a ganglia and left periventricular white matter.  4/17 ultrasound  RUQ;1.0 cm gallstone lodged in the gallbladder neck with no evidence  of cholecystitis.  4/18 CT abdomen pelvis without contrast;No acute intra-abdominal or intrapelvic abnormalities. -Minimal RIGHT pleural effusion and basilar atelectasis. -Cholelithiasis with otherwise unremarkable gallbladder.-Sigmoid diverticulosis. -Umbilical hernia containing fat. 4/18 echocardiogram;- Left ventricle: suggest that overall EF is probably normal. -(grade 1diastolic dysfunction). - Pulmonary arteries: PA peak pressure: 39 mm Hg (S).  Culture No growth appreciated in any culture.  Antibiotics: Zosyn 4/17 > >stopped 4/20 Vancomycin>> stopped4/17   DVT prophylaxis: Subcutaneous heparin  LINES / TUBES:       Objective: VITAL SIGNS: Temp: 98.5 F (36.9 C) (05/05 0926) Temp Source: Oral (05/05 0926) BP: 112/43 mmHg (05/05 0926) Pulse Rate: 85 (05/05 0926) SPO2; FIO2:   Intake/Output Summary (Last 24 hours) at 05/19/14 1252 Last data filed at 05/19/14 0926  Gross per 24 hour  Intake    611 ml  Output    425 ml  Net    186 ml     Exam: General:Alert, awake and oriented to self and place, no distress.  Lungs: CTAB Cardiovascular: Regular rate and rhythm, without murmur, gallops or rub normal S1 and S2 Abdomen: Nontender, nondistended, soft, bowel sounds positive, no rebound,  Extremities: No significant cyanosis, clubbing. trace pitting edema   Data Reviewed: Basic Metabolic Panel:  Recent Labs Lab 05/15/14 0734 05/16/14 0613 05/17/14 0642 05/18/14 0437 05/19/14 0435  NA 139 140 139 138 141  K 3.8 4.1 4.3 4.0 3.7  CL 103 104 102 102 106  CO2 25 27 24 24 25   GLUCOSE 88 98 105* 116* 130*  BUN 44* 53* 61* 29* 41*  CREATININE 5.48* 6.38* 6.98* 4.12* 4.93*  CALCIUM 7.8* 7.9* 8.0* 8.0* 7.9*  MG 1.8 2.0 2.0 1.9 1.8  PHOS  --   --   --  4.1 4.4   Liver Function Tests:  Recent Labs Lab 05/18/14 0437 05/19/14 0435  AST 35  --   ALT 17  --   ALKPHOS 56  --   BILITOT 0.7  --   PROT 5.6*  --   ALBUMIN 2.0*  2.1* 2.0*   No results  for input(s): AMMONIA in the last 168 hours. CBC:  Recent Labs Lab 05/13/14 1436 05/14/14 1500 05/15/14 1507 05/16/14 0613 05/18/14 0437  WBC 9.9 9.5 10.4 10.5 13.3*  HGB 7.5* 7.0* 9.1* 9.1* 9.4*  HCT 22.4* 20.8* 27.3* 27.2* 28.7*  MCV 78.9 80.9 81.0 81.2 82.0  PLT 214 210 192 214 187   CBG:  Recent Labs Lab 05/18/14 1945 05/18/14 2323 05/19/14 0403 05/19/14 0729 05/19/14 1154  GLUCAP 111* 163* 132* 122* 107*    No results found for this or any previous visit (from the past 240 hour(s)).   Studies:  Recent x-ray studies have been reviewed in detail by the Attending Physician  Scheduled Meds:  Scheduled Meds: . sodium chloride   Intravenous Once  . amoxicillin-clavulanate  500 mg Oral Q24H  . aspirin EC  81 mg Oral Daily  . bisacodyl  10 mg Rectal Once  . brimonidine  1 drop Right Eye BID  . docusate sodium  100 mg Oral BID  . feeding supplement (NEPRO CARB STEADY)  237 mL Oral BID  . feeding supplement (PRO-STAT SUGAR FREE 64)  30 mL Oral Daily  . ferric gluconate (FERRLECIT/NULECIT) IV  250 mg Intravenous Q T,Th,Sa-HD  . heparin  5,000 Units Subcutaneous 3 times per day  . multivitamin  1 tablet Oral QHS  . nystatin  Topical BID  . sodium chloride  3 mL Intravenous Q12H    Time spent on care of this patient: 30 mins   Domenic Polite , MD [phone]  If 7PM-7AM, please contact night-coverage [email or web address] Password [email or web address] 05/19/2014, 12:52 PM   LOS: 18 days

## 2014-05-19 NOTE — Clinical Social Work Note (Signed)
CSW received call from patient's daughter, Curtis Brock (in response to my voice mail) regarding placement once patient ready for discharge. Daughter advised that patient's preference is [place]. Daughter is adamantly against patient going to this facility, indicating problems with staff and administration in the past. CSW expressed understanding and encouraged Curtis Brock to talk with patient about her concerns. CSW will continue to follow and assist patient/family with discharge planning.  Hadasah Brugger Givens, MSW, LCSW Licensed Clinical Social Worker [place] [phone]

## 2014-05-19 NOTE — Progress Notes (Signed)
Speech Language Pathology Treatment: Dysphagia  Patient Details Name: Curtis Brock MRN: [id number] DOB: 1925/11/01 Today's Date: 05/19/2014 Time: [id number] SLP Time Calculation (min) (ACUTE ONLY): 11 min  Assessment / Plan / Recommendation Clinical Impression  F/u for dysphagia - pt with willingness to eat, but wanted to be fed and required mod cues and encouragement to feed himself with assist.  Tolerating purees with prolonged oral preparation, but mastication is functional.  Consumed sips of thins from cup and spoon with overall adequate toleration, mild throat-clearing initially that ceased after several swallows.  Lungs are diminished but clear - SLP to f/u next date for diet advancement as MS is improved.    HPI Other Pertinent Information: 81 y.o. male, with past medical history of diabetes mellitus, CVA, hyperlipidemia, altered mental status, shunt admitted with increased lethargy and confusion than baseline. Pt found to be in acute renal failure. CT head with no acute findings. CXR No acute cardiopulmonary disease. MBS 06/17/12 Mild oral phase dysphagia;Mild pharyngeal phase dysphagia;Mild cervical esophageal phase dysphagia; cervical osteophytes at C2-C3, C3-C4, C4-C5, C5-C6 mildly impairing barium flow into esophagus (C5-C6 mostly impacting swallow). Dys 3 and thin liquids recommended.  Pt underwent MBS during this hospital course with recommendation for puree/thin diet with strict precautions.  SLP follow up to determine tolerance of po diet and readiness for dietary advancement.     Pertinent Vitals Pain Assessment: No/denies pain  SLP Plan  Continue with current plan of care    Recommendations Diet recommendations: Dysphagia 1 (puree);Thin liquid Liquids provided via: Teaspoon;Cup Medication Administration: Crushed with puree Supervision: Full supervision/cueing for compensatory strategies;Staff to assist with self feeding Compensations: Slow rate;Small sips/bites Postural Changes  and/or Swallow Maneuvers: Seated upright 90 degrees              Oral Care Recommendations: Oral care BID Plan: Continue with current plan of care    GO     Juan Quam Laurice 05/19/2014, 3:58 PM

## 2014-05-19 NOTE — Clinical Social Work Placement (Signed)
   CLINICAL SOCIAL WORK PLACEMENT  NOTE  Date:  05/19/2014  Patient Details  Name: Curtis Brock MRN: [id number] Date of Birth: 1925-10-12  Clinical Social Work is seeking post-discharge placement for this patient at the [place] level of care (*CSW will initial, date and re-position this form in  chart as items are completed):  Yes   Patient/family provided with [facility] Work Department's list of facilities offering this level of care within the geographic area requested by the patient (or if unable, by the patient's family).  Yes   Patient/family informed of their freedom to choose among providers that offer the needed level of care, that participate in Medicare, Medicaid or managed care program needed by the patient, have an available bed and are willing to accept the patient.  Yes   Patient/family informed of [facility]'s ownership interest in [facility] and [facility], as well as of the fact that they are under no obligation to receive care at these facilities.  PASRR submitted to EDS on       PASRR number received on       Existing PASRR number confirmed on 05/04/14     FL2 transmitted to all facilities in geographic area requested by pt/family on 05/04/14 and 05/19/14     FL2 transmitted to all facilities within larger geographic area on       Patient informed that his/her managed care company has contracts with or will negotiate with certain facilities, including the following:            Patient/family informed of bed offers received.  Patient chooses bed at       Physician recommends and patient chooses bed at      Patient to be transferred to   on  .  Patient to be transferred to facility by       Patient family notified on   of transfer.  Name of family member notified:        PHYSICIAN Please sign FL2     Additional Comment:  5/516 - Updated FL-2 and clinicals re-faxed out to facilities in [facility]   _______________________________________________ Sable Feil, [place] 05/19/2014, 4:17 PM

## 2014-05-19 NOTE — Progress Notes (Signed)
[facility] Progress Note   Subjective:  Has had 4 dialysis treatments for AKI Last HD was 5/3 Only 400 UOP recorded yesterday but a lot in the foley right now A little more awake and conversant today. Very pleasant   Filed Vitals:   05/18/14 0420 05/18/14 1753 05/18/14 2000 05/19/14 0415  BP: 147/56 147/68 146/60 129/87  Pulse: 90 80 78 91  Temp: 97.5 F (36.4 C) 97.5 F (36.4 C) 97.7 F (36.5 C) 98.1 F (36.7 C)  TempSrc: Oral Oral Oral Oral  Resp: 18 16 18 16   Height:      Weight:   109.317 kg (241 lb)   SpO2: 96% 100% 99% 100%   I/O last 3 completed shifts: In: 57 [P.O.:801] Out: 34 [Urine:650]    Exam: Elderly AAM, no distress, frail, a little more alert  Right IJ HD cath (tunnelled) Chest clear bilaterally Regular S1S2 No S3  no MRG Abd obese, NTND, no mass or ascites, no HSM 1+ LE edema bilat. Feet in boots Neuro Weak but more awake and talking some to me today SKIN: R IJ TDC bandaged (tunnelled 4/28) Nontender Foley with yellow urine   Recent Labs Lab 05/17/14 0642 05/18/14 0437 05/19/14 0435  NA 139 138 141  K 4.3 4.0 3.7  CL 102 102 106  CO2 24 24 25   GLUCOSE 105* 116* 130*  BUN 61* 29* 41*  CREATININE 6.98* 4.12* 4.93*  CALCIUM 8.0* 8.0* 7.9*  PHOS  --  4.1 4.4    Recent Labs Lab 05/18/14 0437 05/19/14 0435  AST 35  --   ALT 17  --   ALKPHOS 56  --   BILITOT 0.7  --   PROT 5.6*  --   ALBUMIN 2.0*  2.1* 2.0*    Recent Labs Lab 05/15/14 1507 05/16/14 0613 05/18/14 0437  WBC 10.4 10.5 13.3*  HGB 9.1* 9.1* 9.4*  HCT 27.3* 27.2* 28.7*  MCV 81.0 81.2 82.0  PLT 192 214 187    Medications . sodium chloride   Intravenous Once  . amoxicillin-clavulanate  500 mg Oral Q24H  . aspirin EC  81 mg Oral Daily  . bisacodyl  10 mg Rectal Once  . brimonidine  1 drop Right Eye BID  . docusate sodium  100 mg Oral BID  . feeding supplement (NEPRO CARB STEADY)  237 mL Oral BID  . feeding supplement (PRO-STAT SUGAR FREE  64)  30 mL Oral Daily  . ferric gluconate (FERRLECIT/NULECIT) IV  250 mg Intravenous Q T,Th,Sa-HD  . heparin  5,000 Units Subcutaneous 3 times per day  . multivitamin  1 tablet Oral QHS  . nystatin   Topical BID  . sodium chloride  3 mL Intravenous Q12H     acetaminophen, acetaminophen, bisacodyl, hydrALAZINE, MUSCLE RUB, ondansetron **OR** ondansetron (ZOFRAN) IV, senna-docusate, sorbitol  Assessment: 1. AKI shock/ ARB  1. Last creat PTA 1.01 in Jan 2016 2. HD #1 4/24, #2 4/28, #3 4/30, #4  (5/3) 3. Dialysis initiated as a temporary measure to try to facilitate functional improvements, if ineffective the patient was very clear that he never wished to receive long-term hemodialysis 4. See family conversation in Note on 4/27. 5. Cont to follow closely for recovery - is making some though not great quantities of urine. Creatinine did rise since yesterday but I am inclined to hold off on HD today and see where we are tomorrow.  He is obviously still HD dependent and had wishes NOT to do long term,  but it is too early to tell if will recover or not and family has wanted to push on for now  2. Shock/ low BP resolved, w/u neg Just on Augmentin 3. HTN home meds on hold 4. Blind L eye 5. DM 6. Hx falls / debility 7. Amado, MD [facility] [phone] Pager 05/19/2014, 8:19 AM

## 2014-05-20 LAB — RENAL FUNCTION PANEL
ALBUMIN: 2 g/dL — AB (ref 3.5–5.0)
Anion gap: 10 (ref 5–15)
BUN: 45 mg/dL — AB (ref 6–20)
CO2: 26 mmol/L (ref 22–32)
CREATININE: 5.23 mg/dL — AB (ref 0.61–1.24)
Calcium: 8.1 mg/dL — ABNORMAL LOW (ref 8.9–10.3)
Chloride: 104 mmol/L (ref 101–111)
GFR calc Af Amer: 10 mL/min — ABNORMAL LOW (ref >60)
GFR calc non Af Amer: 9 mL/min — ABNORMAL LOW (ref >60)
Glucose, Bld: 108 mg/dL — ABNORMAL HIGH (ref 70–99)
Phosphorus: 4.5 mg/dL (ref 2.5–4.6)
Potassium: 3.9 mmol/L (ref 3.5–5.1)
Sodium: 140 mmol/L (ref 135–145)

## 2014-05-20 LAB — GLUCOSE, CAPILLARY
GLUCOSE-CAPILLARY: 102 mg/dL — AB (ref 70–99)
GLUCOSE-CAPILLARY: 118 mg/dL — AB (ref 70–99)
GLUCOSE-CAPILLARY: 106 mg/dL — AB (ref 70–99)
Glucose-Capillary: 103 mg/dL — ABNORMAL HIGH (ref 70–99)
Glucose-Capillary: 107 mg/dL — ABNORMAL HIGH (ref 70–99)
Glucose-Capillary: 121 mg/dL — ABNORMAL HIGH (ref 70–99)

## 2014-05-20 LAB — MAGNESIUM: Magnesium: 1.9 mg/dL (ref 1.7–2.4)

## 2014-05-20 NOTE — Progress Notes (Signed)
Curtis Brock Progress Note   Subjective: No acute events overnight. Last HD#4 on 5/3.  Today patient's daughter is at bedside and patient seems to be in good spirits and more animated with her in the room. He denies any new complaints today. Seems to be tolerating PO now with pureed and sips with assistance and SLP. Additionally, CSW working on SNF placement for rehab, disagreement between pt and daughter with regards to location. Admits to continued improved UOP. Yesterday had 700cc UOP (improved from 400), foley remains in place. Denies any CP, SOB, fevers/chills, abd pain, worsening edema   Filed Vitals:   05/19/14 0926 05/19/14 1629 05/19/14 2002 05/20/14 0410  BP: 112/43 124/57 142/57 159/71  Pulse: 85 81 83 92  Temp: 98.5 F (36.9 C) 99.3 F (37.4 C) 98.6 F (37 C) 98.3 F (36.8 C)  TempSrc: Oral Axillary Oral Oral  Resp: 17 17 16 18   Height:      Weight:   253 lb (114.76 kg)   SpO2: 100% 99% 100% 100%   I/O last 3 completed shifts: In: 1051 [P.O.:1051] Out: 26 [Urine:925]    Exam: Gen - elderly frail 59 yr AAM, sitting up in bed, daughter at bedside, pleasant and more animated, NAD Neck: Right IJ HD cath (tunnelled) Heart: Regular S1S2 No S3  no murmurs Lungs - Mostly CTAB with minimal diminished breath sounds at bases, but overall good air movement. Non-labored Abd - obese, NTND, no mass or ascites, no HSM Ext - Stable and mostly unchanged +1 pitting LE edema below knees. Feet in air cushion boot, SCDs in place. Neuro - awake, more alert and conversant today SKIN: R IJ TDC bandaged (tunnelled 4/28) Nontender GU - Foley with yellow urine   Recent Labs Lab 05/18/14 0437 05/19/14 0435 05/20/14 0413  NA 138 141 140  K 4.0 3.7 3.9  CL 102 106 104  CO2 24 25 26   GLUCOSE 116* 130* 108*  BUN 29* 41* 45*  CREATININE 4.12* 4.93* 5.23*  CALCIUM 8.0* 7.9* 8.1*  PHOS 4.1 4.4 4.5    Recent Labs Lab 05/18/14 0437 05/19/14 0435 05/20/14 0413   AST 35  --   --   ALT 17  --   --   ALKPHOS 56  --   --   BILITOT 0.7  --   --   PROT 5.6*  --   --   ALBUMIN 2.0*  2.1* 2.0* 2.0*    Recent Labs Lab 05/15/14 1507 05/16/14 0613 05/18/14 0437  WBC 10.4 10.5 13.3*  HGB 9.1* 9.1* 9.4*  HCT 27.3* 27.2* 28.7*  MCV 81.0 81.2 82.0  PLT 192 214 187    Medications . sodium chloride   Intravenous Once  . amoxicillin-clavulanate  500 mg Oral Q24H  . aspirin EC  81 mg Oral Daily  . bisacodyl  10 mg Rectal Once  . brimonidine  1 drop Right Eye BID  . docusate sodium  100 mg Oral BID  . feeding supplement (NEPRO CARB STEADY)  237 mL Oral BID  . feeding supplement (PRO-STAT SUGAR FREE 64)  30 mL Oral Daily  . ferric gluconate (FERRLECIT/NULECIT) IV  250 mg Intravenous Q T,Th,Sa-HD  . heparin  5,000 Units Subcutaneous 3 times per day  . multivitamin  1 tablet Oral QHS  . nystatin   Topical BID  . sodium chloride  3 mL Intravenous Q12H     acetaminophen, acetaminophen, bisacodyl, hydrALAZINE, MUSCLE RUB, ondansetron **OR** ondansetron (ZOFRAN) IV, senna-docusate, sorbitol  Background:  29 yr M PMH controlled DM (A1c 6.5), h/o CVA, HLD, HTN, L-eye blindness, admitted on 4/17 after being found at home alone more lethargic (AMS). In ED, considered to be severe sepsis with hypotension (?source LE cellulitis), also found to have elevated SCr 2.02 (b/l 1) and oliguria, Renal consulted for ARF (on 4/18) and pt had 1st HD 4/28 with creatinine close to 10 and oligoanuric. Pulled out his temp cath and rec'd [facility] on 4/28 in IR.  Assessment: 1. AKI shock/ ARB  1. Baseline SCr 1.0 (last checked 1.01, 01/2014) 2. Gradual SCr again from 4.12 > 4.93 >> 5.23 (today), however rate of rise seems slower than previous following last HD treatment, seems to be encouraging and may be mild improvement in trend. Also UOP seems improved 400cc > 700cc today. Cont to follow closely for recovery, anticipate may take more time if kidneys will respond 3. Hold off on  HD#5 at this time. No indications, electrolytes stable. Re-evaluate daily. S/p HD #1 4/24, #2 4/28, #3 4/30, #4 5/3 4. Remains HD dependent. Dialysis initiated as a temporary measure to try to facilitate functional improvements, if ineffective the patient was very clear that he never wished to receive long-term hemodialysis. 1. See family conversation in Note on 4/27. 2. Shock/ low BP resolved, w/u neg Just on Augmentin 3. HTN - Hold ARB. 4. Blind L eye 5. DM - controlled. Per primary 6. Hx falls / debility / H/o CVA 7. Dyshagia - pureed diet, SLP following 8. AMS - Resolved  Curtis Brock, [place], PGY-2 (Currently working with [place] [facility])  05/20/2014, 7:53 AM  I have seen and examined this patient and agree with plan and assessment in the above note. Pt has had HD X 3. He is making some urine and rate of rise of creatinine MAY be slowing. Holding off on HD today, and will reassess in the AM. Spoke with his daughter today about his status.  Curtis Brock B,MD 05/20/2014 12:37 PM

## 2014-05-20 NOTE — Progress Notes (Signed)
Progress Note  Curtis Brock [name] DOB: [birth year] DOA: 05/01/2014 PCP: Jenny Reichmann, MD  Admit HPI / Brief Narrative: 81 y.o. 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 In ED workup was significant for acute renal failure with a creatinine of 2, hypothermia with temperature of 92.4, urinalysis was negative, no acute finding on chest x-ray, blood cultures were sent, CT head with no acute findings.   Patient's crt continued to increase after admission to [facility], he remained anuric despite being +7000 mL positive, and was therefore transferred to [facility] for possible need of hemodialysis in near future. Nephrology discussed with family, AND started on HD, Palliative following too   HPI/Subjective: Denies any complaints, making good urine, eating better  Assessment/Plan: Acute renal failure / anuric. -baseline creatinine 1.0 in 1/16 -Nephrology following.  -patient initiated on temporary HD treatment, s/p HD 4/24, 4/28, 4/30 and 5/3 -urine output 700 yesterday but creatinine trending up, per Renal  -not felt to be a long term HD candidate -Palliative following too -continue to monitor urine output, labs  Constipation;  -resolved with suppositary  Anemia;  -due to acute illness, anemia of chronic diseases.  -Received 2 units PRBC 4-30 -ferritin 353 and iron level 23.  -stable  Acute encephalopathy - with good improvement, felt to be metabolic from AKI, SIRS - MRI brain 4/29 normal - ammonia level at 21.   Aspiration PNA; Cough;  -Chest x ray: with RUL infiltrates.  -Started on Augmentin 4-29, completed 7days of Augmentin, stop this -s/p speech eval, with dysphagia on D3 diet today, from D1  Dysphagia; worsening  -Speech consulted.  MRI negative for stroke.   -speech following, s/o eval, was on D1 diet, today advanced to D3 per SLP  SIRS -Patient meets SIRS criteria  on admission given his hypothermia, tachypnea; but not findings supporting sepsis -resolved.  -No convincing radiographic or clinical evidence of infection; continue monitoring off antibiotics  Elevated LFTs -ultrasound showing 1 cm gallstone lodged in the gallbladder neck on admission with no evidence of cholecystitis, CT abdomen pelvis without contrast with no acute finding. -No abdominal pain.  -LFTs normal now -repeat RUQ [place] with cholelithiasis without Gb wall thickening or biliary dilatation  Pressure ulcers/sacral decubitus ulcer -Wound care following -airoverlay mattress   History of CVA -Continue aspirin 81 mg daily for secondary prevention -no new deficit -MRI unremarkable  Fungal rash in groin and abdominal fold areas - Treated with Diflucan, continue with nystatin powder and keep area clean and dry  Essential hypertension -Continue to hold lasix and Cozaar in setting of renal failure.  -stable  Diabetes mellitus Type 2 controlled -Does not take any home medication, CBGs are controlled without insulin. -4/22 Hemoglobin A1c=6.5  Dyslipidemia -Lipid panel; LDL slightly elevated by 88 guidelines and HDL low will hold on adding additional medication at this time. Also patient with elevated LFT>   Hyperkalemia -resolved with Kayexalate 2 doses -monitor  Metabolic acidosis -resolved with HD  Back pain   -will continue PRN pain medications (Tramadol 50 mg QID PRN) -Unable to treat pain with NSAIDs or Tylenol secondary patient's renal and liver failure  Hypothermia -resolved  DVT proph: Hep Sq  Code Status: DNR Family Communication: daughter at bedside Disposition Plan: SNF  Consultants: Dr.Robert Doctor, hospital (nephrology) Dr. Rhea Pink (palliative care)  Procedure/Significant Events: 4/17 CT head without contrast;-no acute findings-small old lacune infarcts the right base a ganglia and  left periventricular white matter.  4/17 ultrasound RUQ;1.0 cm  gallstone lodged in the gallbladder neck with no evidence of cholecystitis.  4/18 CT abdomen pelvis without contrast;No acute intra-abdominal or intrapelvic abnormalities. -Minimal RIGHT pleural effusion and basilar atelectasis. -Cholelithiasis with otherwise unremarkable gallbladder.-Sigmoid diverticulosis. -Umbilical hernia containing fat. 4/18 echocardiogram;- Left ventricle: suggest that overall EF is probably normal. -(grade 1diastolic dysfunction). - Pulmonary arteries: PA peak pressure: 39 mm Hg (S).  Culture No growth appreciated in any culture.  Antibiotics: Zosyn 4/17 > >stopped 4/20 Vancomycin>> stopped4/17   DVT prophylaxis: Subcutaneous heparin  LINES / TUBES:       Objective: VITAL SIGNS: Temp: 98.2 F (36.8 C) (05/06 0956) Temp Source: Oral (05/06 0956) BP: 129/56 mmHg (05/06 0956) Pulse Rate: 88 (05/06 0956) SPO2; FIO2:   Intake/Output Summary (Last 24 hours) at 05/20/14 1125 Last data filed at 05/20/14 0600  Gross per 24 hour  Intake    540 ml  Output    500 ml  Net     40 ml     Exam: General:Alert, awake and oriented to self and place, no distress.  Lungs: CTAB Cardiovascular: Regular rate and rhythm, without murmur, gallops or rub normal S1 and S2 Abdomen: Nontender, nondistended, soft, bowel sounds positive, no rebound,  Extremities: No significant cyanosis, clubbing. trace pitting edema, pressure boots on   Data Reviewed: Basic Metabolic Panel:  Recent Labs Lab 05/16/14 0613 05/17/14 0642 05/18/14 0437 05/19/14 0435 05/20/14 0413  NA 140 139 138 141 140  K 4.1 4.3 4.0 3.7 3.9  CL 104 102 102 106 104  CO2 27 24 24 25 26   GLUCOSE 98 105* 116* 130* 108*  BUN 53* 61* 29* 41* 45*  CREATININE 6.38* 6.98* 4.12* 4.93* 5.23*  CALCIUM 7.9* 8.0* 8.0* 7.9* 8.1*  MG 2.0 2.0 1.9 1.8 1.9  PHOS  --   --  4.1 4.4 4.5   Liver Function Tests:  Recent Labs Lab 05/18/14 0437 05/19/14 0435 05/20/14 0413  AST 35  --   --   ALT 17  --    --   ALKPHOS 56  --   --   BILITOT 0.7  --   --   PROT 5.6*  --   --   ALBUMIN 2.0*  2.1* 2.0* 2.0*   No results for input(s): AMMONIA in the last 168 hours. CBC:  Recent Labs Lab 05/13/14 1436 05/14/14 1500 05/15/14 1507 05/16/14 0613 05/18/14 0437  WBC 9.9 9.5 10.4 10.5 13.3*  HGB 7.5* 7.0* 9.1* 9.1* 9.4*  HCT 22.4* 20.8* 27.3* 27.2* 28.7*  MCV 78.9 80.9 81.0 81.2 82.0  PLT 214 210 192 214 187   CBG:  Recent Labs Lab 05/19/14 1627 05/19/14 2000 05/20/14 0011 05/20/14 0412 05/20/14 0734  GLUCAP 140* 129* 107* 102* 103*    No results found for this or any previous visit (from the past 240 hour(s)).   Studies:   Scheduled Meds:  Scheduled Meds: . sodium chloride   Intravenous Once  . amoxicillin-clavulanate  500 mg Oral Q24H  . aspirin EC  81 mg Oral Daily  . bisacodyl  10 mg Rectal Once  . brimonidine  1 drop Right Eye BID  . docusate sodium  100 mg Oral BID  . feeding supplement (NEPRO CARB STEADY)  237 mL Oral BID  . feeding supplement (PRO-STAT SUGAR FREE 64)  30 mL Oral Daily  . ferric gluconate (FERRLECIT/NULECIT) IV  250 mg Intravenous Q T,Th,Sa-HD  . heparin  5,000 Units Subcutaneous  3 times per day  . multivitamin  1 tablet Oral QHS  . nystatin   Topical BID  . sodium chloride  3 mL Intravenous Q12H    Time spent on care of this patient: 30 mins   Domenic Polite , MD [phone]  If 7PM-7AM, please contact night-coverage [email or web address] Password [email or web address] 05/20/2014, 11:25 AM   LOS: 19 days

## 2014-05-20 NOTE — Progress Notes (Signed)
Speech Language Pathology Treatment: Dysphagia  Patient Details Name: Curtis Brock MRN: [id number] DOB: 1925/11/17 Today's Date: 05/20/2014 Time: 0840-0909 SLP Time Calculation (min) (ACUTE ONLY): 29 min  Assessment / Plan / Recommendation Clinical Impression  Pt demonstrating significant improvement in swallow ability today self feeding with set up assist.  Daughter present during treatment session and SLP educated her to aspiration precautions, diet modifications, etc to maximize airway protection.    Upon raising HOB, pt with coughing and expectoration of viscous secretions - which he reports to be baseline.  Pt consumed moist crackers, oatmeal, juice, milk and coffee.  Liquids tolerated well via cup today with assurance of pt sitting upright. Adequate mastication of soft solids noted today - likely due to improved mental status.  Moderate cues for pt to recall need to dry swallow intermittently after liquids required.    Recommend to advance diet to dys3/thin with intermittent supervision  - encouraging pt to self feed for maximal airway protection.    Daughter reports pt eats oatmeal for breakfast at home and solids for lunch/dinner and that they have pt feed himself.        HPI Other Pertinent Information: 81 y.o. male, with past medical history of diabetes mellitus, CVA, hyperlipidemia, altered mental status, shunt admitted with increased lethargy and confusion than baseline. Pt found to be in acute renal failure. CT head with no acute findings. CXR No acute cardiopulmonary disease. MBS 06/17/12 Mild oral phase dysphagia;Mild pharyngeal phase dysphagia;Mild cervical esophageal phase dysphagia; cervical osteophytes at C2-C3, C3-C4, C4-C5, C5-C6 mildly impairing barium flow into esophagus (C5-C6 mostly impacting swallow). Dys 3 and thin liquids recommended.  Pt underwent MBS during this hospital course with recommendation for puree/thin diet with strict precautions.  SLP follow up to determine  tolerance of po diet and readiness for dietary advancement.     Pertinent Vitals Pain Assessment: No/denies pain  SLP Plan  Continue with current plan of care    Recommendations Diet recommendations: Dysphagia 3 (mechanical soft);Thin liquid Liquids provided via: Teaspoon;Cup Medication Administration: Crushed with puree Supervision: Intermittent supervision to cue for compensatory strategies;Patient able to self feed Compensations: Slow rate;Small sips/bites Postural Changes and/or Swallow Maneuvers: Seated upright 90 degrees;Upright 30-60 min after meal              Oral Care Recommendations: Oral care BID Follow up Recommendations:  (TBD) Plan: Continue with current plan of care    [place], [facility] SLP [phone]

## 2014-05-21 LAB — RENAL FUNCTION PANEL
Albumin: 2.2 g/dL — ABNORMAL LOW (ref 3.5–5.0)
Anion gap: 9 (ref 5–15)
BUN: 52 mg/dL — ABNORMAL HIGH (ref 6–20)
CALCIUM: 8.1 mg/dL — AB (ref 8.9–10.3)
CO2: 25 mmol/L (ref 22–32)
CREATININE: 5.51 mg/dL — AB (ref 0.61–1.24)
Chloride: 105 mmol/L (ref 101–111)
GFR, EST AFRICAN AMERICAN: 10 mL/min — AB (ref >60)
GFR, EST NON AFRICAN AMERICAN: 8 mL/min — AB (ref >60)
Glucose, Bld: 102 mg/dL — ABNORMAL HIGH (ref 70–99)
PHOSPHORUS: 4.6 mg/dL (ref 2.5–4.6)
Potassium: 4.2 mmol/L (ref 3.5–5.1)
Sodium: 139 mmol/L (ref 135–145)

## 2014-05-21 LAB — GLUCOSE, CAPILLARY
GLUCOSE-CAPILLARY: 120 mg/dL — AB (ref 70–99)
GLUCOSE-CAPILLARY: 151 mg/dL — AB (ref 70–99)
Glucose-Capillary: 101 mg/dL — ABNORMAL HIGH (ref 70–99)
Glucose-Capillary: 112 mg/dL — ABNORMAL HIGH (ref 70–99)
Glucose-Capillary: 119 mg/dL — ABNORMAL HIGH (ref 70–99)
Glucose-Capillary: 125 mg/dL — ABNORMAL HIGH (ref 70–99)
Glucose-Capillary: 139 mg/dL — ABNORMAL HIGH (ref 70–99)

## 2014-05-21 LAB — MAGNESIUM: MAGNESIUM: 1.9 mg/dL (ref 1.7–2.4)

## 2014-05-21 NOTE — Progress Notes (Signed)
Progress Note  Curtis Brock [name] DOB: 07/29/1925 DOA: 05/01/2014 PCP: Jenny Reichmann, MD  Admit HPI / Brief Narrative: 81 y.o. 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 In ED workup was significant for acute renal failure with a creatinine of 2, hypothermia with temperature of 92.4, urinalysis was negative, no acute finding on chest x-ray, blood cultures were sent, CT head with no acute findings.   Patient's crt continued to increase after admission to [facility], he remained anuric despite being +7000 mL positive, and was therefore transferred to [facility] for possible need of hemodialysis in near future. Nephrology discussed with family, AND started on HD, Palliative following too   HPI/Subjective: No dyspnea, eating ok  Assessment/Plan: Acute renal failure / anuric. -baseline creatinine 1.0 in 1/16 -Nephrology following.  -patient initiated on temporary HD treatment, s/p HD 4/24, 4/28, 4/30 and 5/3 -urine output 861ml but creatinine trending up slowly, per Renal  -not felt to be a long term HD candidate -Palliative following too -continue to monitor urine output, labs  Constipation;  -resolved with suppositary  Anemia;  -due to acute illness, anemia of chronic diseases.  -Received 2 units PRBC 4-30 -ferritin 353 and iron level 23.  -stable  Acute encephalopathy - with good improvement, felt to be metabolic from AKI, SIRS - MRI brain 4/29 normal - ammonia level at 21.   Aspiration PNA; Cough;  -Chest x ray: with RUL infiltrates.  -Started on Augmentin 4-29, completed 7days of Augmentin, stopped 5/6 -s/p speech eval, with dysphagia on D3 diet today, from D1  Dysphagia; worsening  -Speech consulted.  MRI negative for stroke.   -speech following, s/o eval, was on D1 diet, 5/6 advanced to D3 per SLP  SIRS -Patient meets SIRS criteria on admission given his  hypothermia, tachypnea; but not findings supporting sepsis -resolved.  -No convincing radiographic or clinical evidence of infection; continue monitoring off antibiotics  Elevated LFTs -ultrasound showing 1 cm gallstone lodged in the gallbladder neck on admission with no evidence of cholecystitis, CT abdomen pelvis without contrast with no acute finding. -No abdominal pain.  -LFTs normal now -repeat RUQ [place] with cholelithiasis without Gb wall thickening or biliary dilatation  Pressure ulcers/sacral decubitus ulcer -Wound care following -airoverlay mattress   History of CVA -Continue aspirin 81 mg daily for secondary prevention -no new deficit -MRI unremarkable  Fungal rash in groin and abdominal fold areas - Treated with Diflucan, continue with nystatin powder and keep area clean and dry  Essential hypertension -Continue to hold lasix and Cozaar in setting of renal failure.  -stable  Diabetes mellitus Type 2 controlled -Does not take any home medication, CBGs are controlled without insulin. -4/22 Hemoglobin A1c=6.5  Dyslipidemia -Lipid panel; LDL slightly elevated by 88 guidelines and HDL low will hold on adding additional medication at this time. Also patient with elevated LFT>   Hyperkalemia -resolved with Kayexalate 2 doses -monitor  Metabolic acidosis -resolved with HD  Back pain   -will continue PRN pain medications (Tramadol 50 mg QID PRN) -Unable to treat pain with NSAIDs or Tylenol secondary patient's renal and liver failure  Hypothermia -resolved  DVT proph: Hep SQ  Code Status: DNR Family Communication: daughter at bedside Disposition Plan: SNF  Consultants: Dr.Robert Doctor, hospital (nephrology) Dr. Rhea Pink (palliative care)  Procedure/Significant Events: 4/17 CT head without contrast;-no acute findings-small old lacune infarcts the right base a ganglia and left periventricular white matter.  4/17 ultrasound RUQ;1.0 cm gallstone lodged in the  gallbladder neck with no evidence of cholecystitis.  4/18 CT abdomen pelvis without contrast;No acute intra-abdominal or intrapelvic abnormalities. -Minimal RIGHT pleural effusion and basilar atelectasis. -Cholelithiasis with otherwise unremarkable gallbladder.-Sigmoid diverticulosis. -Umbilical hernia containing fat. 4/18 echocardiogram;- Left ventricle: suggest that overall EF is probably normal. -(grade 1diastolic dysfunction). - Pulmonary arteries: PA peak pressure: 39 mm Hg (S).  Culture No growth appreciated in any culture.  Antibiotics: Zosyn 4/17 > >stopped 4/20 Vancomycin>> stopped4/17   DVT prophylaxis: Subcutaneous heparin  LINES / TUBES:       Objective: VITAL SIGNS: Temp: 99.3 F (37.4 C) (05/07 1021) Temp Source: Oral (05/07 1021) BP: 130/55 mmHg (05/07 1021) Pulse Rate: 78 (05/07 1021)   Intake/Output Summary (Last 24 hours) at 05/21/14 1307 Last data filed at 05/21/14 1144  Gross per 24 hour  Intake    360 ml  Output   1200 ml  Net   -840 ml     Exam: General:Alert, awake and oriented to self and place, no distress.  Lungs: CTAB Cardiovascular: Regular rate and rhythm, without murmur, gallops or rub normal S1 and S2 Abdomen: Nontender, nondistended, soft, bowel sounds positive, no rebound,  Extremities: No significant cyanosis, clubbing. trace pitting edema, pressure boots on   Data Reviewed: Basic Metabolic Panel:  Recent Labs Lab 05/17/14 0642 05/18/14 0437 05/19/14 0435 05/20/14 0413 05/21/14 0720  NA 139 138 141 140 139  K 4.3 4.0 3.7 3.9 4.2  CL 102 102 106 104 105  CO2 24 24 25 26 25   GLUCOSE 105* 116* 130* 108* 102*  BUN 61* 29* 41* 45* 52*  CREATININE 6.98* 4.12* 4.93* 5.23* 5.51*  CALCIUM 8.0* 8.0* 7.9* 8.1* 8.1*  MG 2.0 1.9 1.8 1.9 1.9  PHOS  --  4.1 4.4 4.5 4.6   Liver Function Tests:  Recent Labs Lab 05/18/14 0437 05/19/14 0435 05/20/14 0413 05/21/14 0720  AST 35  --   --   --   ALT 17  --   --   --     ALKPHOS 56  --   --   --   BILITOT 0.7  --   --   --   PROT 5.6*  --   --   --   ALBUMIN 2.0*  2.1* 2.0* 2.0* 2.2*   No results for input(s): AMMONIA in the last 168 hours. CBC:  Recent Labs Lab 05/14/14 1500 05/15/14 1507 05/16/14 0613 05/18/14 0437  WBC 9.5 10.4 10.5 13.3*  HGB 7.0* 9.1* 9.1* 9.4*  HCT 20.8* 27.3* 27.2* 28.7*  MCV 80.9 81.0 81.2 82.0  PLT 210 192 214 187   CBG:  Recent Labs Lab 05/20/14 1955 05/21/14 0057 05/21/14 0406 05/21/14 0824 05/21/14 1118  GLUCAP 106* 151* 112* 101* 139*    No results found for this or any previous visit (from the past 240 hour(s)).   Studies:   Scheduled Meds:  Scheduled Meds: . sodium chloride   Intravenous Once  . aspirin EC  81 mg Oral Daily  . bisacodyl  10 mg Rectal Once  . brimonidine  1 drop Right Eye BID  . docusate sodium  100 mg Oral BID  . feeding supplement (NEPRO CARB STEADY)  237 mL Oral BID  . feeding supplement (PRO-STAT SUGAR FREE 64)  30 mL Oral Daily  . ferric gluconate (FERRLECIT/NULECIT) IV  250 mg Intravenous Q T,Th,Sa-HD  . heparin  5,000 Units Subcutaneous 3 times per day  . multivitamin  1 tablet Oral QHS  . nystatin   Topical BID  . sodium chloride  3 mL Intravenous Q12H    Time spent on care of this patient: 30 mins   Domenic Polite , MD [phone]  If 7PM-7AM, please contact night-coverage [email or web address] Password [email or web address] 05/21/2014, 1:07 PM   LOS: 20 days

## 2014-05-21 NOTE — Progress Notes (Signed)
[facility] Progress Note   Subjective: No acute events overnight. Last HD#4 on 5/3.  Today patient resting without family at bedside. He has not started his breakfast yet. He reports overall feels good. Denies any new symptoms or concerns. Tolerating PO yesterday. Foley catheter in place, with improved urine output, 850cc yesterday, clear/yellow urine. Denies any CP, SOB, fevers/chills, abd pain, worsening edema   Filed Vitals:   05/20/14 0410 05/20/14 0956 05/20/14 1957 05/21/14 0404  BP: 159/71 129/56 145/59 140/64  Pulse: 92 88 79 77  Temp: 98.3 F (36.8 C) 98.2 F (36.8 C) 99 F (37.2 C) 99.6 F (37.6 C)  TempSrc: Oral Oral Oral Oral  Resp: 18 16 17 18   Height:      Weight:   251 lb 3.2 oz (113.944 kg)   SpO2: 100% 99% 100% 98%   I/O last 3 completed shifts: In: 300 [P.O.:300] Out: 1250 [Urine:1250]    Exam: Gen - elderly frail 20 yr AAM, resting in bed, tired but cooperative, NAD Neck: Right IJ HD cath (tunnelled) Heart: Regular S1S2 No S3  no murmurs Lungs - Mostly CTAB with minimal diminished breath sounds at bases, but overall good air movement. Non-labored Abd - obese, NTND, no mass or ascites, no HSM Ext - Mildly increased +1 pitting LE edema extending entire LE to thighs. Feet in air cushion boot, SCDs in place. Neuro - awake, more alert and conversant today SKIN: R IJ TDC bandaged (tunnelled 4/28) Nontender GU - Foley with yellow urine   Recent Labs Lab 05/19/14 0435 05/20/14 0413 05/21/14 0720  NA 141 140 139  K 3.7 3.9 4.2  CL 106 104 105  CO2 25 26 25   GLUCOSE 130* 108* 102*  BUN 41* 45* 52*  CREATININE 4.93* 5.23* 5.51*  CALCIUM 7.9* 8.1* 8.1*  PHOS 4.4 4.5 4.6    Recent Labs Lab 05/18/14 0437 05/19/14 0435 05/20/14 0413  AST 35  --   --   ALT 17  --   --   ALKPHOS 56  --   --   BILITOT 0.7  --   --   PROT 5.6*  --   --   ALBUMIN 2.0*  2.1* 2.0* 2.0*    Recent Labs Lab 05/15/14 1507 05/16/14 0613  05/18/14 0437  WBC 10.4 10.5 13.3*  HGB 9.1* 9.1* 9.4*  HCT 27.3* 27.2* 28.7*  MCV 81.0 81.2 82.0  PLT 192 214 187    Medications . sodium chloride   Intravenous Once  . aspirin EC  81 mg Oral Daily  . bisacodyl  10 mg Rectal Once  . brimonidine  1 drop Right Eye BID  . docusate sodium  100 mg Oral BID  . feeding supplement (NEPRO CARB STEADY)  237 mL Oral BID  . feeding supplement (PRO-STAT SUGAR FREE 64)  30 mL Oral Daily  . ferric gluconate (FERRLECIT/NULECIT) IV  250 mg Intravenous Q T,Th,Sa-HD  . heparin  5,000 Units Subcutaneous 3 times per day  . multivitamin  1 tablet Oral QHS  . nystatin   Topical BID  . sodium chloride  3 mL Intravenous Q12H     acetaminophen, acetaminophen, bisacodyl, hydrALAZINE, MUSCLE RUB, ondansetron **OR** ondansetron (ZOFRAN) IV, senna-docusate, sorbitol  Background: 65 yr M PMH controlled DM (A1c 6.5), h/o CVA, HLD, HTN, L-eye blindness, admitted on 4/17 after being found at home alone more lethargic (AMS). In ED, considered to be severe sepsis with hypotension (?source LE cellulitis), also found to have elevated SCr 2.02 (  b/l 1) and oliguria, Renal consulted for ARF (on 4/18) and pt had 1st HD 4/28 with creatinine close to 10 and oligoanuric. Pulled out his temp cath and rec'd [facility] on 4/28 in IR.  Assessment: 1. AKI shock/ ARB  1. Baseline SCr 1.0 (last checked 1.01, 01/2014) 2. Continued gradual increased SCr again from 4.93 >> 5.23 >> 5.51 (today), however rate of rise seems to be stable and slower to than prior to last HD on 5/3. Cautiously optimistic with improved UOP 700 > 850cc in 24 hours. Cont to follow closely for recovery, anticipate may take more time if kidneys will respond 3. Hold off on HD#5 at this time. No indications, electrolytes stable (K 4.2, Mag 1.9, Phos 4.6, bicarb 25). Re-evaluate daily. S/p HD #1 4/24, #2 4/28, #3 4/30, #4 5/3 4. Remains HD dependent. Dialysis initiated as a temporary measure to try to facilitate functional  improvements, if ineffective the patient was very clear that he never wished to receive long-term hemodialysis. 1. See family conversation in Note on 4/27. 2. Shock/ low BP resolved, w/u neg. Per primary, on augmentin. 3. HTN - Hold ARB. 4. Blind L eye 5. DM - controlled. Per primary 6. Hx falls / debility / H/o CVA 7. Dyshagia - pureed diet, SLP following 8. AMS - Resolved  [name], [place], PGY-2 (Currently working with [facility] - Renal Service)  05/21/2014, 7:03 AM  I have seen and examined this patient and agree with plan as outlined in the above thorough note by Dr. Raliegh Ip. Slow rate of rise of creatinine and hoping for functional recovery. Last HD was 5/3 and lytes/volume are all stable. Making urine. Continue to follow. Dialysis prn. Not felt to be a good candidate for long term HD but the jury is not out yet as to whether he will recover and family pushed for at least temporary HD . Keatin Benham B,MD 05/21/2014 11:25 AM

## 2014-05-22 LAB — RENAL FUNCTION PANEL
ANION GAP: 12 (ref 5–15)
Albumin: 2.2 g/dL — ABNORMAL LOW (ref 3.5–5.0)
BUN: 54 mg/dL — AB (ref 6–20)
CO2: 24 mmol/L (ref 22–32)
Calcium: 8.3 mg/dL — ABNORMAL LOW (ref 8.9–10.3)
Chloride: 105 mmol/L (ref 101–111)
Creatinine, Ser: 5.41 mg/dL — ABNORMAL HIGH (ref 0.61–1.24)
GFR calc non Af Amer: 9 mL/min — ABNORMAL LOW (ref >60)
GFR, EST AFRICAN AMERICAN: 10 mL/min — AB (ref >60)
GLUCOSE: 104 mg/dL — AB (ref 70–99)
PHOSPHORUS: 4.8 mg/dL — AB (ref 2.5–4.6)
POTASSIUM: 4.2 mmol/L (ref 3.5–5.1)
SODIUM: 141 mmol/L (ref 135–145)

## 2014-05-22 LAB — GLUCOSE, CAPILLARY
GLUCOSE-CAPILLARY: 100 mg/dL — AB (ref 70–99)
GLUCOSE-CAPILLARY: 103 mg/dL — AB (ref 70–99)
Glucose-Capillary: 97 mg/dL (ref 70–99)
Glucose-Capillary: 140 mg/dL — ABNORMAL HIGH (ref 70–99)
Glucose-Capillary: 94 mg/dL (ref 70–99)

## 2014-05-22 LAB — MAGNESIUM: Magnesium: 2 mg/dL (ref 1.7–2.4)

## 2014-05-22 MED ORDER — DOCUSATE SODIUM 50 MG/5ML PO LIQD
100.0000 mg | Freq: Two times a day (BID) | ORAL | Status: DC
  Administered 2014-05-22 – 2014-05-23 (×3): 100 mg via ORAL
  Filled 2014-05-22 (×6): qty 10

## 2014-05-22 NOTE — Progress Notes (Signed)
Progress Note  Curtis HOUGHLAND [name] DOB: July 30, 1925 DOA: 05/01/2014 PCP: Jenny Reichmann, MD  Admit HPI / Brief Narrative: 81 y.o. 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 In ED workup was significant for acute renal failure with a creatinine of 2, hypothermia with temperature of 92.4, urinalysis was negative, no acute finding on chest x-ray, blood cultures were sent, CT head with no acute findings.   Patient's crt continued to increase after admission to [facility], he remained anuric despite being +7000 mL positive, and was therefore transferred to [facility] for possible need of hemodialysis in near future. Nephrology discussed with family, AND started on HD, Palliative following too   HPI/Subjective: In good spirits, No dyspnea, eating ok  Assessment/Plan: Acute renal failure / anuric. -baseline creatinine 1.0 in 1/16 -Nephrology following.  -patient initiated on temporary HD treatment, s/p HD 4/24, 4/28, 4/30 and 5/3 -no HD in 5days now -urine output 625ml and creatinine finally plataeuing, per Renal  -not felt to be a long term HD candidate -Palliative following too -continue to monitor urine output, labs  Constipation;  -resolved with suppositary  Anemia;  -due to acute illness, anemia of chronic diseases.  -Received 2 units PRBC 4-30 -ferritin 353 and iron level 23.  -stable  Acute encephalopathy - with good improvement, felt to be metabolic from AKI, SIRS - MRI brain 4/29 normal - ammonia level at 21.   Aspiration PNA; Cough;  -Chest x ray: with RUL infiltrates.  -Started on Augmentin 4-29, completed 7days of Augmentin, stopped 5/6 -s/p speech eval, with dysphagia on D3 diet today, from D1  Dysphagia; worsening  -Speech consulted.  MRI negative for stroke.   -speech following, s/o eval, was on D1 diet, 5/6 advanced to D3 per SLP  SIRS -Patient meets SIRS  criteria on admission given his hypothermia, tachypnea; but not findings supporting sepsis -resolved.  -No convincing radiographic or clinical evidence of infection; continue monitoring off antibiotics  Elevated LFTs -ultrasound showing 1 cm gallstone lodged in the gallbladder neck on admission with no evidence of cholecystitis, CT abdomen pelvis without contrast with no acute finding. -No abdominal pain.  -LFTs normal now -repeat RUQ [place] with cholelithiasis without Gb wall thickening or biliary dilatation  Pressure ulcers/sacral decubitus ulcer -Wound care following -airoverlay mattress   History of CVA -Continue aspirin 81 mg daily for secondary prevention -no new deficit -MRI unremarkable  Fungal rash in groin and abdominal fold areas - Treated with Diflucan, continue with nystatin powder and keep area clean and dry  Essential hypertension -Continue to hold lasix and Cozaar in setting of renal failure.  -stable  Diabetes mellitus Type 2 controlled -Does not take any home medication, CBGs are controlled without insulin. -4/22 Hemoglobin A1c=6.5  Dyslipidemia -Lipid panel; LDL slightly elevated by 88 guidelines and HDL low will hold on adding additional medication at this time. Also patient with elevated LFT>   Hyperkalemia -resolved with Kayexalate 2 doses -monitor  Metabolic acidosis -resolved with HD  Back pain   -will continue PRN pain medications (Tramadol 50 mg QID PRN) -Unable to treat pain with NSAIDs or Tylenol secondary patient's renal and liver failure  Hypothermia -resolved  DVT proph: Hep SQ  Code Status: DNR Family Communication: daughter at bedside Disposition Plan: SNF  Consultants: Dr.Robert Doctor, hospital (nephrology) Dr. Rhea Pink (palliative care)  Procedure/Significant Events: 4/17 CT head without contrast;-no acute findings-small old lacune infarcts the right base  a ganglia and left periventricular white matter.  4/17 ultrasound RUQ;1.0  cm gallstone lodged in the gallbladder neck with no evidence of cholecystitis.  4/18 CT abdomen pelvis without contrast;No acute intra-abdominal or intrapelvic abnormalities. -Minimal RIGHT pleural effusion and basilar atelectasis. -Cholelithiasis with otherwise unremarkable gallbladder.-Sigmoid diverticulosis. -Umbilical hernia containing fat. 4/18 echocardiogram;- Left ventricle: suggest that overall EF is probably normal. -(grade 1diastolic dysfunction). - Pulmonary arteries: PA peak pressure: 39 mm Hg (S).  Culture No growth appreciated in any culture.  Antibiotics: Zosyn 4/17 > >stopped 4/20 Vancomycin>> stopped4/17   DVT prophylaxis: Subcutaneous heparin  LINES / TUBES:       Objective: VITAL SIGNS: Temp: 98.8 F (37.1 C) (05/08 0820) Temp Source: Oral (05/08 0820) BP: 160/83 mmHg (05/08 0820) Pulse Rate: 81 (05/08 0820)   Intake/Output Summary (Last 24 hours) at 05/22/14 1037 Last data filed at 05/22/14 [id number]  Gross per 24 hour  Intake    340 ml  Output    625 ml  Net   -285 ml     Exam: General:Alert, awake and oriented to self and place, no distress.  Lungs: CTAB Cardiovascular: Regular rate and rhythm, without murmur, gallops or rub normal S1 and S2 Abdomen: Nontender, nondistended, soft, bowel sounds positive, no rebound,  Extremities: No significant cyanosis, clubbing. trace pitting edema, pressure boots on   Data Reviewed: Basic Metabolic Panel:  Recent Labs Lab 05/18/14 0437 05/19/14 0435 05/20/14 0413 05/21/14 0720 05/22/14 0415  NA 138 141 140 139 141  K 4.0 3.7 3.9 4.2 4.2  CL 102 106 104 105 105  CO2 24 25 26 25 24   GLUCOSE 116* 130* 108* 102* 104*  BUN 29* 41* 45* 52* 54*  CREATININE 4.12* 4.93* 5.23* 5.51* 5.41*  CALCIUM 8.0* 7.9* 8.1* 8.1* 8.3*  MG 1.9 1.8 1.9 1.9 2.0  PHOS 4.1 4.4 4.5 4.6 4.8*   Liver Function Tests:  Recent Labs Lab 05/18/14 0437 05/19/14 0435 05/20/14 0413 05/21/14 0720 05/22/14 0415  AST 35  --    --   --   --   ALT 17  --   --   --   --   ALKPHOS 56  --   --   --   --   BILITOT 0.7  --   --   --   --   PROT 5.6*  --   --   --   --   ALBUMIN 2.0*  2.1* 2.0* 2.0* 2.2* 2.2*   No results for input(s): AMMONIA in the last 168 hours. CBC:  Recent Labs Lab 05/15/14 1507 05/16/14 0613 05/18/14 0437  WBC 10.4 10.5 13.3*  HGB 9.1* 9.1* 9.4*  HCT 27.3* 27.2* 28.7*  MCV 81.0 81.2 82.0  PLT 192 214 187   CBG:  Recent Labs Lab 05/21/14 1809 05/21/14 1956 05/21/14 2353 05/22/14 0406 05/22/14 0756  GLUCAP 119* 120* 125* 100* 97    No results found for this or any previous visit (from the past 240 hour(s)).   Studies:   Scheduled Meds:  Scheduled Meds: . sodium chloride   Intravenous Once  . aspirin EC  81 mg Oral Daily  . bisacodyl  10 mg Rectal Once  . brimonidine  1 drop Right Eye BID  . docusate  100 mg Oral BID  . feeding supplement (NEPRO CARB STEADY)  237 mL Oral BID  . feeding supplement (PRO-STAT SUGAR FREE 64)  30 mL Oral Daily  . heparin  5,000 Units Subcutaneous 3 times per day  .  multivitamin  1 tablet Oral QHS  . nystatin   Topical BID  . sodium chloride  3 mL Intravenous Q12H    Time spent on care of this patient: 30 mins   Domenic Polite , MD [phone]  If 7PM-7AM, please contact night-coverage [email or web address] Password [email or web address] 05/22/2014, 10:37 AM   LOS: 21 days

## 2014-05-22 NOTE — Progress Notes (Signed)
[facility] Progress Note   Subjective: No acute events overnight.  Last HD#4 on 5/3. Lying in bed - no family in Has not eaten breakfast yet because no one in to help him but says he IS hungry Still has foley in, UOP 625-800/day (with negative fluid balance past several days because takes in little)    Filed Vitals:   05/21/14 1021 05/21/14 1746 05/21/14 1957 05/22/14 0420  BP: 130/55 139/66 141/62 146/58  Pulse: 78 79 83 80  Temp: 99.3 F (37.4 C) 98 F (36.7 C) 99 F (37.2 C) 98.4 F (36.9 C)  TempSrc: Oral Axillary    Resp: 19 16 20 20   Height:      Weight:   247 lb (112.038 kg)   SpO2: 98% 97% 98% 100%   I/O last 3 completed shifts: In: 75 [P.O.:640] Out: 1475 [Urine:1475]    Exam: Gen - elderly frail 36 yr AAM. Lying in bed, answers questions, waiting for breakfast Neck: Right IJ HD cath (tunnelled) (4/28) Heart: Regular S1S2 No S3  no murmur Lungs - Anteriorly clear Abd - obese, NTND. + BS Ext - Trace edema, including post thights. Feet in protective boots Neuro - Awake, converses when asked questions, does not volunteer much SKIN: R IJ TDC dressing intact (tunnelled 4/28) Nontender GU - Foley with yellow urine   Recent Labs Lab 05/20/14 0413 05/21/14 0720 05/22/14 0415  NA 140 139 141  K 3.9 4.2 4.2  CL 104 105 105  CO2 26 25 24   GLUCOSE 108* 102* 104*  BUN 45* 52* 54*  CREATININE 5.23* 5.51* 5.41*  CALCIUM 8.1* 8.1* 8.3*  PHOS 4.5 4.6 4.8*    Recent Labs Lab 05/18/14 0437  05/20/14 0413 05/21/14 0720 05/22/14 0415  AST 35  --   --   --   --   ALT 17  --   --   --   --   ALKPHOS 56  --   --   --   --   BILITOT 0.7  --   --   --   --   PROT 5.6*  --   --   --   --   ALBUMIN 2.0*  2.1*  < > 2.0* 2.2* 2.2*  < > = values in this interval not displayed.  Recent Labs Lab 05/15/14 1507 05/16/14 0613 05/18/14 0437  WBC 10.4 10.5 13.3*  HGB 9.1* 9.1* 9.4*  HCT 27.3* 27.2* 28.7*  MCV 81.0 81.2 82.0  PLT 192 214 187     Medications . sodium chloride   Intravenous Once  . aspirin EC  81 mg Oral Daily  . bisacodyl  10 mg Rectal Once  . brimonidine  1 drop Right Eye BID  . docusate sodium  100 mg Oral BID  . feeding supplement (NEPRO CARB STEADY)  237 mL Oral BID  . feeding supplement (PRO-STAT SUGAR FREE 64)  30 mL Oral Daily  . ferric gluconate (FERRLECIT/NULECIT) IV  250 mg Intravenous Q T,Th,Sa-HD  . heparin  5,000 Units Subcutaneous 3 times per day  . multivitamin  1 tablet Oral QHS  . nystatin   Topical BID  . sodium chloride  3 mL Intravenous Q12H     acetaminophen, acetaminophen, bisacodyl, hydrALAZINE, MUSCLE RUB, ondansetron **OR** ondansetron (ZOFRAN) IV, senna-docusate, sorbitol  Background: 31 yr M PMH controlled DM (A1c 6.5), h/o CVA, HLD, HTN, L-eye blindness, admitted on 4/17 after being found at home alone more lethargic (AMS). In ED, considered to  be severe sepsis with hypotension, also found to have elevated SCr 2.02 (b/l 1) and oliguria, with progressive worsening in renal function.  Renal consulted for ARF (on 4/18) and pt had 1st HD 4/28 with creatinine close to 10 and oligoanuric. Pulled out his temp cath and rec'd [facility] on 4/28 in IR.  Assessment: 1. AKI shock/ ARB  1. Baseline SCr 1.0 (last checked 1.01, 01/2014) 2. Creatinine appears to be reaching plateau  (5.23 >> 5.51 >> 5.41 today (5/8). (Significant for one of first drops in SCr not following HD, and previously with slower rate in rise, may represent plateau). Remain cautiously optimistic with overall improved UOP 625cc in 24 hours. Cont to follow closely for recovery, anticipate may take more time if kidneys will respond. 3. Hold off on HD#5 at this time (last HD on 5/3). No indications, electrolytes stable (K 4.2, Mag 2.0, Phos 4.8 - minimally elevated, bicarb 24). Re-evaluate daily. S/p HD #1 4/24, #2 4/28, #3 4/30, #4 5/3 4. Remains HD dependent. Dialysis initiated as a temporary measure to try to facilitate functional  improvements, if ineffective the patient was very clear that he never wished to receive long-term hemodialysis. 1. See family conversation in Note on 4/27. 2. Shock/ low BP resolved, w/u neg. Per primary. 3. Aspiration PNA - per primary. Completed course Augmentin PO 4. HTN - Hold ARB. 5. Blind L eye 6. DM - controlled. Per primary 7. Hx falls / debility / H/o CVA 8. Dyshagia - pureed diet, SLP following 9. AMS - Resolved  [name], [place], PGY-2 (Currently working with [place] [facility])  05/22/2014, 7:09 AM   I have seen and examined this patient and agree with plan and assessment in the above note. Creatinine may have actually reached a plateau and pt is non-oliguric, perhaps heralding some return of renal function. As above, will continue to hold HD and follow the trend. He is a marginal to poor candidate for long term HD due to age/overally frailty and marginal functional status/mobility (and patient himself had apparently said at some point that he did not want long term dialysis, although in my conversations with him in the hospital it is not clear to me that he understands the implications either way).  Hopeful will not have to further those discussions if current trend continues. Dymir Neeson B,MD 05/22/2014 8:27 AM

## 2014-05-23 LAB — RENAL FUNCTION PANEL
Albumin: 2.3 g/dL — ABNORMAL LOW (ref 3.5–5.0)
Anion gap: 11 (ref 5–15)
BUN: 59 mg/dL — AB (ref 6–20)
CALCIUM: 8.2 mg/dL — AB (ref 8.9–10.3)
CO2: 24 mmol/L (ref 22–32)
CREATININE: 5.42 mg/dL — AB (ref 0.61–1.24)
Chloride: 106 mmol/L (ref 101–111)
GFR calc Af Amer: 10 mL/min — ABNORMAL LOW (ref >60)
GFR calc non Af Amer: 9 mL/min — ABNORMAL LOW (ref >60)
GLUCOSE: 92 mg/dL (ref 70–99)
PHOSPHORUS: 4.9 mg/dL — AB (ref 2.5–4.6)
Potassium: 4.3 mmol/L (ref 3.5–5.1)
SODIUM: 141 mmol/L (ref 135–145)

## 2014-05-23 LAB — MAGNESIUM: Magnesium: 1.9 mg/dL (ref 1.7–2.4)

## 2014-05-23 LAB — GLUCOSE, CAPILLARY
GLUCOSE-CAPILLARY: 88 mg/dL (ref 70–99)
Glucose-Capillary: 101 mg/dL — ABNORMAL HIGH (ref 70–99)
Glucose-Capillary: 108 mg/dL — ABNORMAL HIGH (ref 70–99)
Glucose-Capillary: 110 mg/dL — ABNORMAL HIGH (ref 70–99)
Glucose-Capillary: 87 mg/dL (ref 70–99)
Glucose-Capillary: 97 mg/dL (ref 70–99)

## 2014-05-23 MED ORDER — FUROSEMIDE 40 MG PO TABS
40.0000 mg | ORAL_TABLET | Freq: Every day | ORAL | Status: DC
  Administered 2014-05-23: 40 mg via ORAL
  Filled 2014-05-23 (×2): qty 1

## 2014-05-23 NOTE — Progress Notes (Signed)
[facility] Progress Note   Subjective: No acute events overnight.  Last HD#4 on 5/3. Today patient resting in bed, just finished breakfast, daughter at bedside. He is in good spirits. No complaints. Would like to get up to bedside chair or work more with nursing / PT. Still has foley in, improved UOP 1175cc yesterday. Admits persistent lower ext edema. Denies any CP, SOB, nausea / vomiting, abdominal pain.   Filed Vitals:   05/22/14 0820 05/22/14 1700 05/22/14 2003 05/23/14 0435  BP: 160/83 138/50 139/60 154/71  Pulse: 81 77 75 76  Temp: 98.8 F (37.1 C) 99 F (37.2 C) 97.8 F (36.6 C) 98.9 F (37.2 C)  TempSrc: Oral Oral Oral Oral  Resp: 18     Height:      Weight:   249 lb 1.6 oz (112.991 kg)   SpO2: 100% 94% 96% 99%   I/O last 3 completed shifts: In: 64 [P.O.:510] Out: 1450 [Urine:1450]    Exam: Gen - elderly frail 6 yr AAM. Sitting up in bed with daughter at bedside. Comfortable, NAD Heart: Regular S1S2 No S3  no murmur Lungs - Anteriorly clear bilaterally without significant crackles. Mild diminished air movement bases. Non-labored Abd - obese, soft, NTND. + BS Ext - mildly increased bilateral lower ext +1 pitting edema ankles to knees, including post thights. Feet in protective boots Neuro - Awake, alert, oriented [facility] hospital, 2016, month, president) SKIN: R IJ TDC dressing intact (tunnelled 4/28) Nontender GU - Foley with yellow urine   Recent Labs Lab 05/21/14 0720 05/22/14 0415 05/23/14 0453  NA 139 141 141  K 4.2 4.2 4.3  CL 105 105 106  CO2 25 24 24   GLUCOSE 102* 104* 92  BUN 52* 54* 59*  CREATININE 5.51* 5.41* 5.42*  CALCIUM 8.1* 8.3* 8.2*  PHOS 4.6 4.8* 4.9*    Recent Labs Lab 05/18/14 0437  05/21/14 0720 05/22/14 0415 05/23/14 0453  AST 35  --   --   --   --   ALT 17  --   --   --   --   ALKPHOS 56  --   --   --   --   BILITOT 0.7  --   --   --   --   PROT 5.6*  --   --   --   --   ALBUMIN 2.0*  2.1*  < > 2.2*  2.2* 2.3*  < > = values in this interval not displayed.  Recent Labs Lab 05/18/14 0437  WBC 13.3*  HGB 9.4*  HCT 28.7*  MCV 82.0  PLT 187    Medications . sodium chloride   Intravenous Once  . aspirin EC  81 mg Oral Daily  . bisacodyl  10 mg Rectal Once  . brimonidine  1 drop Right Eye BID  . docusate  100 mg Oral BID  . feeding supplement (NEPRO CARB STEADY)  237 mL Oral BID  . feeding supplement (PRO-STAT SUGAR FREE 64)  30 mL Oral Daily  . heparin  5,000 Units Subcutaneous 3 times per day  . multivitamin  1 tablet Oral QHS  . nystatin   Topical BID  . sodium chloride  3 mL Intravenous Q12H     acetaminophen, acetaminophen, bisacodyl, hydrALAZINE, MUSCLE RUB, ondansetron **OR** ondansetron (ZOFRAN) IV, senna-docusate, sorbitol  Background: 83 yr M PMH controlled DM (A1c 6.5), h/o CVA, HLD, HTN, L-eye blindness, admitted on 4/17 after being found at home alone more lethargic (AMS). In ED,  severe sepsis with hypotension, also found to have elevated SCr 2.02 (b/l 1) and oliguria, with progressive worsening in renal function.  Renal consulted for ARF (on 4/18) and pt had 1st HD 4/28 with creatinine close to 10 and oligoanuric. Pulled out his temp cath and rec'd [facility] on 4/28 in IR.  Assessment: 1. AKI shock/ ARB - non-oliguric 1. Baseline SCr 1.0 (last checked 1.01, 01/2014) 2. Creatinine appears to have reached a plateau  5.51 > 5.41 > 5.42 today (5/9).Without worsening, remain cautiously optimistic with overall cont improved UOP. Cont to follow closely for recovery, anticipate may take more time if kidneys will respond. 3. Hold off on HD#5 at this time (last HD on 5/3). No indications, electrolytes stable (K 4.3, Mag 1.9, Phos 4.9 - minimally elevated, bicarb 24). Re-evaluate daily. S/p HD #1 4/24, #2 4/28, #3 4/30, #4 5/3 4. Start low dose diuresis with Lasix PO 40mg  daily today (given mildly HTN BPs, inc volume status, stagnant SCr). Titrate up daily as needed. 5. Still  considered HD dependent, but may be gradually improving. Dialysis initiated as a temporary measure to try to facilitate functional improvements, if ineffective the patient was very clear that he never wished to receive long-term hemodialysis. 1. See family conversation in Note on 4/27. 2. Shock/ low BP resolved, w/u neg. Per primary. 3. Aspiration PNA - per primary. Completed course Augmentin PO 4. HTN - Hold ARB. 5. Blind L eye 6. DM - controlled. Per primary 7. Hx falls / debility / H/o CVA 8. Dyshagia - pureed diet, SLP following 9. AMS - Resolved  [name], [place], PGY-2 (Currently working with [place] - [facility])  05/23/2014, 7:54 AM   Patient seen and examined, agree with above note with above modifications. Patient is stable and in good spirits.  GFR is quite poor but no absolute indication for HD and he/family do not want chronic HD so we are just hoping for some more improvement.  Will start low dose lasix today for volume and blood pressure  Corliss Parish, MD 05/23/2014

## 2014-05-23 NOTE — Progress Notes (Signed)
Progress Note  Curtis Brock [name] DOB: [birth year] DOA: 05/01/2014 PCP: Jenny Reichmann, MD  Admit HPI / Brief Narrative: 81 y.o. 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 In ED workup was significant for acute renal failure with a creatinine of 2, hypothermia with temperature of 92.4, urinalysis was negative, no acute finding on chest x-ray, blood cultures were sent, CT head with no acute findings.   Patient's crt continued to increase after admission to [facility], he remained anuric despite being +7000 mL positive, and was therefore transferred to [facility] for possible need of hemodialysis in near future. Nephrology discussed with family, AND started on HD, Palliative following too   HPI/Subjective: Remains in good spirits,  eating better, breathing ok  Assessment/Plan: Acute renal failure / anuric. -baseline creatinine 1.0 in 1/16 -Nephrology following.  -patient initiated on temporary HD treatment, s/p HD 4/24, 4/28, 4/30 and 5/3 -no HD in 5days now -urine output 1.1L and creatinine finally plataeuing, per Renal  -not felt to be a long term HD candidate -Palliative following too -continue to monitor urine output, labs  Anemia;  -due to acute illness, anemia of chronic diseases.  -Received 2 units PRBC 4-30 -ferritin 353 and iron level 23.  -stable  Acute encephalopathy - with good improvement, felt to be metabolic from AKI, SIRS - MRI brain 4/29 normal - ammonia level at 21.   Aspiration PNA; Cough;  -Chest x ray: with RUL infiltrates.  -Started on Augmentin 4-29, completed 7days of Augmentin, stopped 5/6 -s/p speech eval, with dysphagia on D3 diet today, from D1  Dysphagia; worsening  -Speech consulted.  MRI negative for stroke.   -speech following, s/o eval, was on D1 diet, 5/6 advanced to D3 per SLP  SIRS -Patient meets SIRS criteria on admission given his  hypothermia, tachypnea; but not findings supporting sepsis -resolved.  -No convincing radiographic or clinical evidence of infection; continue monitoring off antibiotics  Elevated LFTs -ultrasound showing 1 cm gallstone lodged in the gallbladder neck on admission with no evidence of cholecystitis, CT abdomen pelvis without contrast with no acute finding. -No abdominal pain.  -LFTs normal now -repeat RUQ [place] with cholelithiasis without GB wall thickening or biliary dilatation  Pressure ulcers/sacral decubitus ulcer -Wound care following -airoverlay mattress   History of CVA -Continue aspirin 81 mg daily for secondary prevention -no new deficit -MRI unremarkable  Fungal rash in groin and abdominal fold areas - Treated with Diflucan, continue with nystatin powder and keep area clean and dry  Essential hypertension -Continue to hold lasix and Cozaar in setting of renal failure.  -stable  Constipation;  -resolved with suppositary  Diabetes mellitus Type 2 controlled -Does not take any home medication, CBGs are controlled without insulin. -4/22 Hemoglobin A1c=6.5  Dyslipidemia -Lipid panel; LDL slightly elevated by 88 guidelines and HDL low will hold on adding additional medication at this time. Also patient with elevated LFT>   Hyperkalemia -resolved with Kayexalate 2 doses -monitor  Metabolic acidosis -resolved with HD  Back pain   -will continue PRN pain medications (Tramadol 50 mg QID PRN) -Unable to treat pain with NSAIDs or Tylenol secondary patient's renal and liver failure  Hypothermia -resolved  DVT proph: Hep SQ  Code Status: DNR Family Communication: daughter at bedside Disposition Plan: SNF  Consultants: Dr.Robert Doctor, hospital (nephrology) Dr. Rhea Pink (palliative care)  Procedure/Significant Events: 4/17 CT head without contrast;-no acute findings-small old lacune infarcts the  right base a ganglia and left periventricular white matter.  4/17  ultrasound RUQ;1.0 cm gallstone lodged in the gallbladder neck with no evidence of cholecystitis.  4/18 CT abdomen pelvis without contrast;No acute intra-abdominal or intrapelvic abnormalities. -Minimal RIGHT pleural effusion and basilar atelectasis. -Cholelithiasis with otherwise unremarkable gallbladder.-Sigmoid diverticulosis. -Umbilical hernia containing fat. 4/18 echocardiogram;- Left ventricle: suggest that overall EF is probably normal. -(grade 1diastolic dysfunction). - Pulmonary arteries: PA peak pressure: 39 mm Hg (S).  Culture No growth appreciated in any culture.  Antibiotics: Zosyn 4/17 > >stopped 4/20 Vancomycin>> stopped4/17  DVT prophylaxis: Subcutaneous heparin  LINES / TUBES:     Objective: VITAL SIGNS: Temp: 98.7 F (37.1 C) (05/09 0836) Temp Source: Oral (05/09 0836) BP: 150/59 mmHg (05/09 0836) Pulse Rate: 80 (05/09 0836)   Intake/Output Summary (Last 24 hours) at 05/23/14 1406 Last data filed at 05/23/14 0836  Gross per 24 hour  Intake    510 ml  Output   1175 ml  Net   -665 ml     Exam:  General:Alert, awake and oriented to self and place, no distress.  Lungs: CTAB Cardiovascular: Regular rate and rhythm, without murmur, gallops or rub normal S1 and S2 Abdomen: Nontender, nondistended, soft, bowel sounds positive, no rebound,  Extremities: No significant cyanosis, clubbing. trace pitting edema, pressure boots on   Data Reviewed: Basic Metabolic Panel:  Recent Labs Lab 05/19/14 0435 05/20/14 0413 05/21/14 0720 05/22/14 0415 05/23/14 0453  NA 141 140 139 141 141  K 3.7 3.9 4.2 4.2 4.3  CL 106 104 105 105 106  CO2 25 26 25 24 24   GLUCOSE 130* 108* 102* 104* 92  BUN 41* 45* 52* 54* 59*  CREATININE 4.93* 5.23* 5.51* 5.41* 5.42*  CALCIUM 7.9* 8.1* 8.1* 8.3* 8.2*  MG 1.8 1.9 1.9 2.0 1.9  PHOS 4.4 4.5 4.6 4.8* 4.9*   Liver Function Tests:  Recent Labs Lab 05/18/14 0437 05/19/14 0435 05/20/14 0413 05/21/14 0720 05/22/14 0415  05/23/14 0453  AST 35  --   --   --   --   --   ALT 17  --   --   --   --   --   ALKPHOS 56  --   --   --   --   --   BILITOT 0.7  --   --   --   --   --   PROT 5.6*  --   --   --   --   --   ALBUMIN 2.0*  2.1* 2.0* 2.0* 2.2* 2.2* 2.3*   No results for input(s): AMMONIA in the last 168 hours. CBC:  Recent Labs Lab 05/18/14 0437  WBC 13.3*  HGB 9.4*  HCT 28.7*  MCV 82.0  PLT 187   CBG:  Recent Labs Lab 05/22/14 1957 05/22/14 2351 05/23/14 0346 05/23/14 0747 05/23/14 1154  GLUCAP 103* 87 97 88 108*    No results found for this or any previous visit (from the past 240 hour(s)).   Studies:   Scheduled Meds:  Scheduled Meds: . sodium chloride   Intravenous Once  . aspirin EC  81 mg Oral Daily  . bisacodyl  10 mg Rectal Once  . brimonidine  1 drop Right Eye BID  . docusate  100 mg Oral BID  . feeding supplement (NEPRO CARB STEADY)  237 mL Oral BID  . feeding supplement (PRO-STAT SUGAR FREE 64)  30 mL Oral Daily  . furosemide  40 mg Oral Daily  .  heparin  5,000 Units Subcutaneous 3 times per day  . multivitamin  1 tablet Oral QHS  . nystatin   Topical BID  . sodium chloride  3 mL Intravenous Q12H    Time spent on care of this patient: 30 mins   Domenic Polite , MD [phone]  If 7PM-7AM, please contact night-coverage [email or web address] Password [email or web address] 05/23/2014, 2:06 PM   LOS: 22 days

## 2014-05-23 NOTE — Progress Notes (Signed)
Physical Therapy Treatment Patient Details Name: Curtis Brock MRN: [id number] DOB: 05-Aug-1925 Today's Date: 05/23/2014    History of Present Illness 81 yo male admitted with SIRS, sepsis, AMS. as of 4/24, pt with temporary femoral HD catheter; Temporary femoral HD catheter removed 4/26, and PT reordered; Hx of DM,CVA    PT Comments    Patient making slow gains with mobility.  Agree with need for SNF at discharge for continued therapy.  Follow Up Recommendations  SNF;Supervision/Assistance - 24 hour     Equipment Recommendations  Wheelchair (measurements PT);Wheelchair cushion (measurements PT)    Recommendations for Other Services       Precautions / Restrictions Precautions Precautions: Fall Restrictions Weight Bearing Restrictions: No    Mobility  Bed Mobility               General bed mobility comments: Patient in recliner as PT entered room (maximove sling in chair with patient).  Transfers Overall transfer level: Needs assistance Equipment used: Rolling walker (2 wheeled) Transfers: Sit to/from Stand Sit to Stand: Max assist;+2 physical assistance         General transfer comment: Patient required assist to lean forward in chair to get trunk forward to stand.  Required mod assist for sitting balance.  Verbal and tactile cues for hand placement.  Required +2 max assist to power up to standing.  Cues to extend hips and stand upright.  Patient able to stand 30-40 seconds.  Assist to control descent into chair.  After sitting rest, patient stood a second time with similar results.  Once in chair, required +2 max assist to position patient.  Ambulation/Gait                 Stairs            Wheelchair Mobility    Modified Rankin (Stroke Patients Only)       Balance Overall balance assessment: Needs assistance Sitting-balance support: Bilateral upper extremity supported;Feet supported Sitting balance-Leahy Scale: Zero     Standing balance  support: Bilateral upper extremity supported Standing balance-Leahy Scale: Zero                      Cognition Arousal/Alertness: Lethargic Behavior During Therapy: Flat affect Overall Cognitive Status: Impaired/Different from baseline Area of Impairment: Attention;Following commands;Problem solving   Current Attention Level: Sustained (Needed frequent cues to open eyes and attend to task)   Following Commands: Follows one step commands with increased time     Problem Solving: Slow processing;Decreased initiation;Requires verbal cues General Comments: Eyes closed majority of the time.  Verbal cues to open eyes.  Lethargic.    Exercises      General Comments        Pertinent Vitals/Pain Pain Assessment: No/denies pain    Home Living                      Prior Function            PT Goals (current goals can now be found in the care plan section) Progress towards PT goals: Progressing toward goals    Frequency  Min 3X/week    PT Plan Current plan remains appropriate    Co-evaluation             End of Session Equipment Utilized During Treatment: Gait belt Activity Tolerance: Patient limited by fatigue Patient left: in chair;with call bell/phone within reach;with chair alarm set;with family/visitor present  Time: [id number] PT Time Calculation (min) (ACUTE ONLY): 24 min  Charges:  $Therapeutic Activity: 23-37 mins                    G Codes:      Despina Pole May 28, 2014, 1:17 PM Carita Pian. Sanjuana Kava, [place] Pager [phone]

## 2014-05-23 NOTE — Progress Notes (Signed)
Progress Note  TUG REUM [name] DOB: September 17, 1925 DOA: 05/01/2014 PCP: Jenny Reichmann, MD  Admit HPI / Brief Narrative: 81 y.o. 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 In ED workup was significant for acute renal failure with a creatinine of 2, hypothermia with temperature of 92.4, urinalysis was negative, no acute finding on chest x-ray, blood cultures were sent, CT head with no acute findings.   Patient's crt continued to increase after admission to [facility], he remained anuric despite being +7000 mL positive, and was therefore transferred to [facility] for possible need of hemodialysis in near future. Nephrology discussed with family, AND started on HD, Palliative following too   HPI/Subjective: Remains in good spirits,  eating better, breathing ok  Assessment/Plan: Acute renal failure / anuric. -baseline creatinine 1.0 in 1/16 -Nephrology following.  -patient initiated on temporary HD treatment, s/p HD 4/24, 4/28, 4/30 and 5/3 -no HD in 5days now -urine output 1.1L and creatinine finally plataeuing, per Renal  -not felt to be a long term HD candidate -Palliative following too -continue to monitor urine output, labs  Constipation;  -resolved with suppositary  Anemia;  -due to acute illness, anemia of chronic diseases.  -Received 2 units PRBC 4-30 -ferritin 353 and iron level 23.  -stable  Acute encephalopathy - with good improvement, felt to be metabolic from AKI, SIRS - MRI brain 4/29 normal - ammonia level at 21.   Aspiration PNA; Cough;  -Chest x ray: with RUL infiltrates.  -Started on Augmentin 4-29, completed 7days of Augmentin, stopped 5/6 -s/p speech eval, with dysphagia on D3 diet today, from D1  Dysphagia; worsening  -Speech consulted.  MRI negative for stroke.   -speech following, s/o eval, was on D1 diet, 5/6 advanced to D3 per SLP  SIRS -Patient  meets SIRS criteria on admission given his hypothermia, tachypnea; but not findings supporting sepsis -resolved.  -No convincing radiographic or clinical evidence of infection; continue monitoring off antibiotics  Elevated LFTs -ultrasound showing 1 cm gallstone lodged in the gallbladder neck on admission with no evidence of cholecystitis, CT abdomen pelvis without contrast with no acute finding. -No abdominal pain.  -LFTs normal now -repeat RUQ [place] with cholelithiasis without Gb wall thickening or biliary dilatation  Pressure ulcers/sacral decubitus ulcer -Wound care following -airoverlay mattress   History of CVA -Continue aspirin 81 mg daily for secondary prevention -no new deficit -MRI unremarkable  Fungal rash in groin and abdominal fold areas - Treated with Diflucan, continue with nystatin powder and keep area clean and dry  Essential hypertension -Continue to hold lasix and Cozaar in setting of renal failure.  -stable  Diabetes mellitus Type 2 controlled -Does not take any home medication, CBGs are controlled without insulin. -4/22 Hemoglobin A1c=6.5  Dyslipidemia -Lipid panel; LDL slightly elevated by 88 guidelines and HDL low will hold on adding additional medication at this time. Also patient with elevated LFT>   Hyperkalemia -resolved with Kayexalate 2 doses -monitor  Metabolic acidosis -resolved with HD  Back pain   -will continue PRN pain medications (Tramadol 50 mg QID PRN) -Unable to treat pain with NSAIDs or Tylenol secondary patient's renal and liver failure  Hypothermia -resolved  DVT proph: Hep SQ  Code Status: DNR Family Communication: daughter at bedside Disposition Plan: SNF  Consultants: Dr.Robert Doctor, hospital (nephrology) Dr. Rhea Pink (palliative care)  Procedure/Significant Events: 4/17 CT head without contrast;-no acute findings-small old lacune infarcts the  right base a ganglia and left periventricular white matter.  4/17  ultrasound RUQ;1.0 cm gallstone lodged in the gallbladder neck with no evidence of cholecystitis.  4/18 CT abdomen pelvis without contrast;No acute intra-abdominal or intrapelvic abnormalities. -Minimal RIGHT pleural effusion and basilar atelectasis. -Cholelithiasis with otherwise unremarkable gallbladder.-Sigmoid diverticulosis. -Umbilical hernia containing fat. 4/18 echocardiogram;- Left ventricle: suggest that overall EF is probably normal. -(grade 1diastolic dysfunction). - Pulmonary arteries: PA peak pressure: 39 mm Hg (S).  Culture No growth appreciated in any culture.  Antibiotics: Zosyn 4/17 > >stopped 4/20 Vancomycin>> stopped4/17   DVT prophylaxis: Subcutaneous heparin  LINES / TUBES:       Objective: VITAL SIGNS: Temp: 98.7 F (37.1 C) (05/09 0836) Temp Source: Oral (05/09 0836) BP: 150/59 mmHg (05/09 0836) Pulse Rate: 80 (05/09 0836)   Intake/Output Summary (Last 24 hours) at 05/23/14 1331 Last data filed at 05/23/14 0836  Gross per 24 hour  Intake    510 ml  Output   1175 ml  Net   -665 ml     Exam: General:Alert, awake and oriented to self and place, no distress.  Lungs: CTAB Cardiovascular: Regular rate and rhythm, without murmur, gallops or rub normal S1 and S2 Abdomen: Nontender, nondistended, soft, bowel sounds positive, no rebound,  Extremities: No significant cyanosis, clubbing. trace pitting edema, pressure boots on   Data Reviewed: Basic Metabolic Panel:  Recent Labs Lab 05/19/14 0435 05/20/14 0413 05/21/14 0720 05/22/14 0415 05/23/14 0453  NA 141 140 139 141 141  K 3.7 3.9 4.2 4.2 4.3  CL 106 104 105 105 106  CO2 25 26 25 24 24   GLUCOSE 130* 108* 102* 104* 92  BUN 41* 45* 52* 54* 59*  CREATININE 4.93* 5.23* 5.51* 5.41* 5.42*  CALCIUM 7.9* 8.1* 8.1* 8.3* 8.2*  MG 1.8 1.9 1.9 2.0 1.9  PHOS 4.4 4.5 4.6 4.8* 4.9*   Liver Function Tests:  Recent Labs Lab 05/18/14 0437 05/19/14 0435 05/20/14 0413 05/21/14 0720  05/22/14 0415 05/23/14 0453  AST 35  --   --   --   --   --   ALT 17  --   --   --   --   --   ALKPHOS 56  --   --   --   --   --   BILITOT 0.7  --   --   --   --   --   PROT 5.6*  --   --   --   --   --   ALBUMIN 2.0*  2.1* 2.0* 2.0* 2.2* 2.2* 2.3*   No results for input(s): AMMONIA in the last 168 hours. CBC:  Recent Labs Lab 05/18/14 0437  WBC 13.3*  HGB 9.4*  HCT 28.7*  MCV 82.0  PLT 187   CBG:  Recent Labs Lab 05/22/14 1957 05/22/14 2351 05/23/14 0346 05/23/14 0747 05/23/14 1154  GLUCAP 103* 87 97 88 108*    No results found for this or any previous visit (from the past 240 hour(s)).   Studies:   Scheduled Meds:  Scheduled Meds: . sodium chloride   Intravenous Once  . aspirin EC  81 mg Oral Daily  . bisacodyl  10 mg Rectal Once  . brimonidine  1 drop Right Eye BID  . docusate  100 mg Oral BID  . feeding supplement (NEPRO CARB STEADY)  237 mL Oral BID  . feeding supplement (PRO-STAT SUGAR FREE 64)  30 mL Oral Daily  . furosemide  40 mg  Oral Daily  . heparin  5,000 Units Subcutaneous 3 times per day  . multivitamin  1 tablet Oral QHS  . nystatin   Topical BID  . sodium chloride  3 mL Intravenous Q12H    Time spent on care of this patient: 30 mins   Curtis Brock , MD [phone]  If 7PM-7AM, please contact night-coverage [email or web address] Password [email or web address] 05/23/2014, 1:31 PM   LOS: 22 days

## 2014-05-23 NOTE — Clinical Social Work Placement (Signed)
   CLINICAL SOCIAL WORK PLACEMENT  NOTE  Date:  05/23/2014  Patient Details  Name: Curtis Brock MRN: [id number] Date of Birth: 06/04/1925  Clinical Social Work is seeking post-discharge placement for this patient at the [place] level of care (*CSW will initial, date and re-position this form in  chart as items are completed):  Yes   Patient/family provided with [place] [facility] Work Department's list of facilities offering this level of care within the geographic area requested by the patient (or if unable, by the patient's family).  Yes   Patient/family informed of their freedom to choose among providers that offer the needed level of care, that participate in Medicare, Medicaid or managed care program needed by the patient, have an available bed and are willing to accept the patient.  Yes   Patient/family informed of [facility]'s ownership interest in [facility] and [facility], as well as of the fact that they are under no obligation to receive care at these facilities.  PASRR submitted to EDS on       PASRR number received on       Existing PASRR number confirmed on 05/04/14     FL2 transmitted to all facilities in geographic area requested by pt/family on 05/04/14     FL2 transmitted to all facilities within larger geographic area on       Patient informed that his/her managed care company has contracts with or will negotiate with certain facilities, including the following:         05/23/14   Patient/family informed of bed offers received.  Patient chooses bed at       Physician recommends and patient chooses bed at      Patient to be transferred to   on  .  Patient to be transferred to facility by       Patient family notified on   of transfer.  Name of family member notified:        PHYSICIAN Please sign FL2     Additional Comment: 05/23/14: CSW provided patient with SNF list with responses. He gave CSW permission to contact his  daughter, [name] [phone]) and also provide her with the facility responses.    _______________________________________________ Sable Feil, LCSW 05/23/2014, 4:27 PM

## 2014-05-23 NOTE — Progress Notes (Signed)
Patient gradually improving. Plan is for SNF, consider requesting Palliative Care services to follow at SNF. I have communicated with his PCP Dr. Everlene Farrier who has offered to continue assisting patient and family with goals of care and advance care planning discussions. Will sign off for now- re-consult if his condition changes or if there are urgent palliative needs.  Lane Hacker, DO Palliative Medicine [phone]

## 2014-05-24 ENCOUNTER — Inpatient Hospital Stay (HOSPITAL_COMMUNITY): Payer: Medicare Other

## 2014-05-24 DIAGNOSIS — I472 Ventricular tachycardia: Secondary | ICD-10-CM | POA: Diagnosis not present

## 2014-05-24 DIAGNOSIS — N170 Acute kidney failure with tubular necrosis: Secondary | ICD-10-CM | POA: Diagnosis not present

## 2014-05-24 DIAGNOSIS — J690 Pneumonitis due to inhalation of food and vomit: Secondary | ICD-10-CM | POA: Diagnosis not present

## 2014-05-24 DIAGNOSIS — R8299 Other abnormal findings in urine: Secondary | ICD-10-CM | POA: Diagnosis not present

## 2014-05-24 DIAGNOSIS — M6281 Muscle weakness (generalized): Secondary | ICD-10-CM | POA: Diagnosis not present

## 2014-05-24 DIAGNOSIS — H548 Legal blindness, as defined in USA: Secondary | ICD-10-CM | POA: Diagnosis not present

## 2014-05-24 DIAGNOSIS — I10 Essential (primary) hypertension: Secondary | ICD-10-CM | POA: Diagnosis not present

## 2014-05-24 DIAGNOSIS — I270 Primary pulmonary hypertension: Secondary | ICD-10-CM | POA: Diagnosis not present

## 2014-05-24 DIAGNOSIS — N185 Chronic kidney disease, stage 5: Secondary | ICD-10-CM | POA: Diagnosis not present

## 2014-05-24 DIAGNOSIS — R262 Difficulty in walking, not elsewhere classified: Secondary | ICD-10-CM | POA: Diagnosis not present

## 2014-05-24 DIAGNOSIS — Z452 Encounter for adjustment and management of vascular access device: Secondary | ICD-10-CM | POA: Diagnosis not present

## 2014-05-24 DIAGNOSIS — N179 Acute kidney failure, unspecified: Secondary | ICD-10-CM | POA: Diagnosis not present

## 2014-05-24 DIAGNOSIS — I639 Cerebral infarction, unspecified: Secondary | ICD-10-CM | POA: Diagnosis not present

## 2014-05-24 DIAGNOSIS — F99 Mental disorder, not otherwise specified: Secondary | ICD-10-CM | POA: Diagnosis not present

## 2014-05-24 DIAGNOSIS — E118 Type 2 diabetes mellitus with unspecified complications: Secondary | ICD-10-CM | POA: Diagnosis not present

## 2014-05-24 DIAGNOSIS — R1311 Dysphagia, oral phase: Secondary | ICD-10-CM | POA: Diagnosis not present

## 2014-05-24 DIAGNOSIS — I6990 Unspecified sequelae of unspecified cerebrovascular disease: Secondary | ICD-10-CM | POA: Diagnosis not present

## 2014-05-24 DIAGNOSIS — L89152 Pressure ulcer of sacral region, stage 2: Secondary | ICD-10-CM | POA: Diagnosis not present

## 2014-05-24 DIAGNOSIS — N184 Chronic kidney disease, stage 4 (severe): Secondary | ICD-10-CM | POA: Diagnosis not present

## 2014-05-24 DIAGNOSIS — R4182 Altered mental status, unspecified: Secondary | ICD-10-CM | POA: Diagnosis not present

## 2014-05-24 DIAGNOSIS — F0391 Unspecified dementia with behavioral disturbance: Secondary | ICD-10-CM | POA: Diagnosis not present

## 2014-05-24 DIAGNOSIS — R531 Weakness: Secondary | ICD-10-CM | POA: Diagnosis not present

## 2014-05-24 DIAGNOSIS — I69952 Hemiplegia and hemiparesis following unspecified cerebrovascular disease affecting left dominant side: Secondary | ICD-10-CM | POA: Diagnosis not present

## 2014-05-24 DIAGNOSIS — I120 Hypertensive chronic kidney disease with stage 5 chronic kidney disease or end stage renal disease: Secondary | ICD-10-CM | POA: Diagnosis not present

## 2014-05-24 DIAGNOSIS — R1310 Dysphagia, unspecified: Secondary | ICD-10-CM | POA: Diagnosis not present

## 2014-05-24 DIAGNOSIS — M79673 Pain in unspecified foot: Secondary | ICD-10-CM | POA: Diagnosis not present

## 2014-05-24 DIAGNOSIS — G9340 Encephalopathy, unspecified: Secondary | ICD-10-CM | POA: Diagnosis not present

## 2014-05-24 LAB — GLUCOSE, CAPILLARY
GLUCOSE-CAPILLARY: 105 mg/dL — AB (ref 70–99)
GLUCOSE-CAPILLARY: 101 mg/dL — AB (ref 70–99)
GLUCOSE-CAPILLARY: 97 mg/dL (ref 70–99)
Glucose-Capillary: 114 mg/dL — ABNORMAL HIGH (ref 70–99)
Glucose-Capillary: 93 mg/dL (ref 70–99)

## 2014-05-24 LAB — RENAL FUNCTION PANEL
Albumin: 2.3 g/dL — ABNORMAL LOW (ref 3.5–5.0)
Anion gap: 13 (ref 5–15)
BUN: 58 mg/dL — AB (ref 6–20)
CO2: 22 mmol/L (ref 22–32)
CREATININE: 5.16 mg/dL — AB (ref 0.61–1.24)
Calcium: 8.3 mg/dL — ABNORMAL LOW (ref 8.9–10.3)
Chloride: 107 mmol/L (ref 101–111)
GFR calc Af Amer: 11 mL/min — ABNORMAL LOW (ref >60)
GFR, EST NON AFRICAN AMERICAN: 9 mL/min — AB (ref >60)
Glucose, Bld: 93 mg/dL (ref 70–99)
POTASSIUM: 4.2 mmol/L (ref 3.5–5.1)
Phosphorus: 4.9 mg/dL — ABNORMAL HIGH (ref 2.5–4.6)
Sodium: 142 mmol/L (ref 135–145)

## 2014-05-24 LAB — MAGNESIUM: Magnesium: 1.9 mg/dL (ref 1.7–2.4)

## 2014-05-24 MED ORDER — CHLORHEXIDINE GLUCONATE 4 % EX LIQD
CUTANEOUS | Status: AC
  Filled 2014-05-24: qty 15

## 2014-05-24 MED ORDER — FUROSEMIDE 40 MG PO TABS: 40.0000 mg | ORAL_TABLET | Freq: Two times a day (BID) | ORAL | Status: DC

## 2014-05-24 MED ORDER — SENNOSIDES-DOCUSATE SODIUM 8.6-50 MG PO TABS: 1.0000 | ORAL_TABLET | Freq: Every evening | ORAL | Status: DC | PRN

## 2014-05-24 MED ORDER — FUROSEMIDE 40 MG PO TABS
60.0000 mg | ORAL_TABLET | Freq: Every day | ORAL | Status: DC
  Filled 2014-05-24 (×2): qty 1

## 2014-05-24 MED ORDER — FUROSEMIDE 40 MG PO TABS
40.0000 mg | ORAL_TABLET | Freq: Two times a day (BID) | ORAL | Status: DC
  Administered 2014-05-24 (×2): 40 mg via ORAL
  Filled 2014-05-24 (×3): qty 1

## 2014-05-24 MED ORDER — LIDOCAINE HCL 1 % IJ SOLN
INTRAMUSCULAR | Status: AC
  Filled 2014-05-24: qty 20

## 2014-05-24 NOTE — Discharge Summary (Signed)
Physician Discharge Summary  Curtis Brock [name] DOB: 1925/04/21 DOA: 05/01/2014  PCP: Jenny Reichmann, MD  Admit date: 05/01/2014 Discharge date: 05/24/2014  Time spent: 45 minutes  Recommendations for Outpatient Follow-up:  1. PCP in 1 week, Palliative Care FU at SNF  Discharge Diagnoses:    Anuric Acute Renal Failure s/p Temporary hemodialysis   Diabetes mellitus   CVA (cerebral infarction)   Elevated LFTs   SIRS (systemic inflammatory response syndrome)   Acute encephalopathy   Acute renal failure   Fungal rash of trunk   Altered mental status   Hypothermia   Essential hypertension   Hyperkalemia   Diabetes type 2, controlled   Sacral decubitus ulcer   CVA (cerebral vascular accident)   Pulmonary hypertension   Metabolic acidosis   Midline low back pain without sciatica   Acute kidney injury   Discharge Condition: stable  Diet recommendation: Renal  Filed Weights   05/21/14 1957 05/22/14 2003 05/23/14 2016  Weight: 112.038 kg (247 lb) 112.991 kg (249 lb 1.6 oz) 110.814 kg (244 lb 4.8 oz)    History of present illness:  Chief complaint: altered mentation HPI:Curtis Brock is a 81 y.o. male, with past medical history of diabetes mellitus, history of CVA, hyperlipidemia, presented with altered mental status. daughter noticed that he was more lethargic than usual, she found him sitting on a chair more confused, weak could not stand up, so she brought him to ED, in ED workup was significant for acute renal failure with a creatinine of 2, hypothermia with temperature of 92.4       Hospital Course:  Acute renal failure -presented with AKI/ATN on admission which progressively worsened to creatinine of 10 with anuria -baseline creatinine was 1.0 in 1/16 -Followed closely by Nephrology and Palliative care  -patient initiated on temporary HD treatment, s/p HD 4/24, 4/28, 4/30 and 5/3 -since then urine output started improving  -has not required HD for 7days in a row  now -urine output much improved and creatinine finally improving albeit very slowly -not felt to be a long term HD candidate, HD catheter removed per Renal recommendations -continue lasix 40mg  BID now and then titrate dose PRN -Per Renal can go to SNF now   Anemia;  -due to acute illness, anemia of chronic diseases.  -Received 2 units PRBC 4-30 -ferritin 353 and iron level 23.  -stable  Acute encephalopathy - with good improvement, felt to be metabolic from AKI, SIRS - MRI brain 4/29 normal - ammonia level at 21.   Aspiration PNA; Cough;  -Chest x ray at one point showed: with RUL infiltrates.  -Started on Augmentin 4-29, completed 7days of Augmentin, stopped 5/6 -improved -s/p speech eval, with dysphagia, now  on D3 diet  Dysphagia; worsening  -Speech consulted. MRI negative for stroke.  -speech following, s/o eval, was on D1 diet, 5/6 advanced to D3 per SLP  SIRS -Patient meets SIRS criteria on admission given his hypothermia, tachypnea; but not findings supporting sepsis -resolved.  -No convincing radiographic or clinical evidence of infection; was monitored off antibiotics  Elevated LFTs-resolved -ultrasound showing 1 cm gallstone lodged in the gallbladder neck on admission with no evidence of cholecystitis, CT abdomen pelvis without contrast with no acute finding. -No abdominal pain.  -repeat RUQ [place] with cholelithiasis without GB wall thickening or biliary dilatation  Pressure ulcers/sacral decubitus ulcer -Seen by Wound care, wearing boots to prevent worsening of pressure sores  -airoverlay mattress   History of CVA -Continue aspirin 81 mg  daily for secondary prevention -no new deficit -MRI unremarkable  Essential hypertension -stopped Cozaar in setting of renal failure.  -stable  Constipation;  -resolved with suppositary  Diabetes mellitus Type 2 controlled -Does not take any home medication, CBGs are controlled without insulin. -4/22  Hemoglobin A1c=6.5  Dyslipidemia -Lipid panel; LDL slightly elevated by 88 guidelines and HDL low will hold on adding additional medication at this time. Also patient with elevated LFT recently  Hyperkalemia -resolved with Kayexalate 2 doses -monitor  Metabolic acidosis -resolved with HD   Consultations:  Renal  Discharge Exam: Filed Vitals:   05/24/14 1000  BP: 162/72  Pulse: 82  Temp: 98 F (36.7 C)  Resp: 20    General: AAox self and place, elderly male, no distress Cardiovascular: S1S2/RRR Respiratory: CTAB  Discharge Instructions   Discharge Instructions    Diet - low sodium heart healthy    Complete by:  As directed      Increase activity slowly    Complete by:  As directed           Current Discharge Medication List    START taking these medications   Details  senna-docusate (SENOKOT-S) 8.6-50 MG per tablet Take 1 tablet by mouth at bedtime as needed for mild constipation.      CONTINUE these medications which have CHANGED   Details  furosemide (LASIX) 40 MG tablet Take 1 tablet (40 mg total) by mouth 2 (two) times daily. TAKE 1 TABLET (40 MG TOTAL) BY MOUTH DAILY.      CONTINUE these medications which have NOT CHANGED   Details  acetaminophen (TYLENOL) 325 MG tablet Take 2 tablets (650 mg total) by mouth every 6 (six) hours as needed for mild pain or moderate pain (or Fever >/= 101).    aspirin EC 81 MG tablet Take 81 mg by mouth daily.    brimonidine (ALPHAGAN) 0.2 % ophthalmic solution Place 1 drop into the right eye 2 (two) times daily.    cyanocobalamin (,VITAMIN B-12,) 1000 MCG/ML injection INJECT 1 ML AS DIRECTED EVERY MONTH Qty: 10 mL, Refills: 0      STOP taking these medications     losartan (COZAAR) 50 MG tablet        No Known Allergies    The results of significant diagnostics from this hospitalization (including imaging, microbiology, ancillary and laboratory) are listed below for reference.    Significant Diagnostic  Studies: Ct Abdomen Pelvis Wo Contrast  05/02/2014   CLINICAL DATA:  Weakness, lethargy, hypothermia, BILATERAL lower extremity erythema, elevated LFTs, evaluation for source of sepsis  EXAM: CT ABDOMEN AND PELVIS WITHOUT CONTRAST  TECHNIQUE: Multidetector CT imaging of the abdomen and pelvis was performed following the standard protocol without IV contrast. Sagittal and coronal MPR images reconstructed from axial data set. Patient drank dilute oral contrast for exam.  COMPARISON:  04/17/2013  FINDINGS: Minimal RIGHT pleural effusion and RIGHT basilar atelectasis.  Within limits of a nonenhanced exam no focal abnormalities of the liver, spleen, pancreas, kidneys, or adrenal glands.  Calcified gallstone within gallbladder 19 mm greatest size.  No hydronephrosis or ureteral dilatation.  Scattered atherosclerotic calcifications.  Foley catheter decompresses urinary bladder.  Umbilical hernia containing fat.  Sigmoid diverticulosis without evidence of diverticulitis.  Stomach and bowel loops otherwise normal appearance.  No mass, adenopathy, free fluid or free air.  Normal appendix.  Osseous demineralization with scattered degenerative changes of the thoracolumbar spine.  IMPRESSION: No acute intra-abdominal or intrapelvic abnormalities.  Minimal RIGHT pleural effusion  and basilar atelectasis.  Cholelithiasis with otherwise unremarkable gallbladder.  Sigmoid diverticulosis.  Umbilical hernia containing fat.   Electronically Signed   By: Lavonia Dana M.D.   On: 05/02/2014 14:00   Dg Chest 2 View  05/13/2014   CLINICAL DATA:  2-3 days of nonproductive cough, history of hypertension diabetes and acute renal failure  EXAM: CHEST  2 VIEW  COMPARISON:  Chest x-ray of April 26th 2016  FINDINGS: The left lung is well-expanded. The left hemidiaphragm is now partially obscured. There is new increased density posteriorly in the left lower lobe. There is a trace of pleural fluid on the right. There is stable volume loss on the  right with apparent elevation of the hemidiaphragm. The cardiac silhouette is mildly enlarged. The pulmonary vascularity is not engorged. The mediastinum is normal in width. The dialysis catheter tip projects over the midportion of the SVC. There is multilevel degenerative disc disease. There are degenerative changes of both shoulders.  IMPRESSION: New increased density in the left lower lobe consistent with atelectasis and/or pneumonia. The examination is otherwise stable.   Electronically Signed   By: David  Martinique M.D.   On: 05/13/2014 17:05   Dg Chest 2 View  05/01/2014   CLINICAL DATA:  Per EPIC chart notes, pt brought by EMS due to family-observed mental status change, hypothermic, at time of imaging, pt seems disoriented, unable to communicate, unable to follow directions for imaging, Held by tech for imaging, pt unwilling to raise arms for lateral view  EXAM: CHEST  2 VIEW  COMPARISON:  02/05/2014  FINDINGS: Cardiac silhouette normal in size and configuration. Normal mediastinal and hilar contours.  Clear lungs.  No pleural effusion or pneumothorax.  Bony thorax is demineralized but grossly intact.  IMPRESSION: No acute cardiopulmonary disease.   Electronically Signed   By: Lajean Manes M.D.   On: 05/01/2014 07:26   Dg Abd 1 View  05/12/2014   CLINICAL DATA:  Constipation.  EXAM: ABDOMEN - 1 VIEW  COMPARISON:  CT scan dated 05/02/2014  FINDINGS: The bowel gas pattern is normal with no evidence of excessive stool in the colon. No fecal impaction.  Chronic degenerative changes in the lumbar spine. No abnormal abdominal calcifications.  IMPRESSION: Benign appearing abdomen.  No excessive stool visible in the colon.   Electronically Signed   By: Lorriane Shire M.D.   On: 05/12/2014 11:22   Ct Head Wo Contrast  05/01/2014   CLINICAL DATA:  Behavioral changes.  EXAM: CT HEAD WITHOUT CONTRAST  TECHNIQUE: Contiguous axial images were obtained from the base of the skull through the vertex without  intravenous contrast.  COMPARISON:  02/05/2014 and 04/18/2013.  FINDINGS: Ventricles are normal in configuration. There is ventricular and sulcal enlargement reflecting moderate atrophy. No hydrocephalus.  There are no parenchymal masses or mass effect. There are small old lacune infarcts the right base a ganglia and left periventricular white matter. Patchy areas of white matter hypoattenuation are noted more diffusely consistent with moderate chronic microvascular ischemic change. There is no evidence of a recent cortical infarct.  There are no extra-axial masses or abnormal fluid collections.  There is no intracranial hemorrhage.  Visualized sinuses and mastoid air cells are clear.  IMPRESSION: 1. No acute intracranial abnormalities. 2. Moderate atrophy and chronic microvascular ischemic change. Small old lacune infarcts.   Electronically Signed   By: Lajean Manes M.D.   On: 05/01/2014 07:09   Mr Brain Wo Contrast  05/13/2014   CLINICAL DATA:  Altered  mental status. Acute renal failure. Lethargy. Confusion.  EXAM: MRI HEAD WITHOUT CONTRAST  TECHNIQUE: Multiplanar, multiecho pulse sequences of the brain and surrounding structures were obtained without intravenous contrast.  COMPARISON:  Head CT 05/01/2014.  MRI 05/10/2012.  FINDINGS: Diffusion imaging does not show any acute or subacute infarction. The brain shows generalized atrophy. No focal insult affecting the brainstem or cerebellum. The cerebral hemispheres show chronic small-vessel ischemic change of the white matter. No cortical or large vessel territory infarction. No mass lesion, hemorrhage, hydrocephalus or extra-axial collection. No pituitary mass. No inflammatory sinus disease. Small amount of fluid in the mastoid air cells. No skull or skullbase lesion.  IMPRESSION: No acute or reversible finding. Age related atrophy and chronic small vessel disease.   Electronically Signed   By: Nelson Chimes M.D.   On: 05/13/2014 14:37   Ir Fluoro Guide Cv  Line Right  05/12/2014   INDICATION: 81 year old with end-stage renal disease and needs a catheter for hemodialysis.  EXAM: FLUOROSCOPIC AND ULTRASOUND GUIDED PLACEMENT OF A TUNNELED DIALYSIS CATHETER  Physician: Stephan Minister. Henn, MD  FLUOROSCOPY TIME:  30 seconds, 20.3 mGy  MEDICATIONS: 3 g Ancef. 2 mg versed, 100 mcg fentanyl. A radiology nurse monitored the patient for moderate sedation.  As antibiotic prophylaxis, Ancef was ordered pre-procedure and administered intravenously within one hour of incision.  ANESTHESIA/SEDATION: Moderate sedation time: 20 minutes  PROCEDURE: Informed consent was obtained for placement of a tunneled dialysis catheter. The patient was placed supine on the interventional table. Ultrasound confirmed a patent right internal jugularvein. Ultrasound images were obtained for documentation. The right side of the neck was prepped and draped in a sterile fashion. The right side of the neck was anesthetized with 1% lidocaine. Maximal barrier sterile technique was utilized including caps, mask, sterile gowns, sterile gloves, sterile drape, hand hygiene and skin antiseptic. A small incision was made with #11 blade scalpel. A 21 gauge needle directed into the right internal jugular vein with ultrasound guidance. A micropuncture dilator set was placed. A 23 cm tip to cuff HemoSplit catheter was selected. The skin below the right clavicle was anesthetized and a small incision was made with an #11 blade scalpel. A subcutaneous tunnel was formed to the vein dermatotomy site. The catheter was brought through the tunnel. The vein dermatotomy site was dilated to accommodate a peel-away sheath. The catheter was placed through the peel-away sheath and directed into the central venous structures. The tip of the catheter was placed at the superior cavoatrial junction with fluoroscopy. Fluoroscopic images were obtained for documentation. Both lumens were found to aspirate and flush well. The proper amount of  heparin was flushed in both lumens. The vein dermatotomy site was closed using a single layer of absorbable suture and Dermabond. Gel-Foam was placed in the subcutaneous tract for hemostasis. The catheter was secured to the skin using Prolene suture.  FINDINGS: Catheter tip at the superior cavoatrial junction.  Estimated blood loss: Minimal  COMPLICATIONS: None  IMPRESSION: Successful placement of a right chest tunneled dialysis catheter using ultrasound and fluoroscopic guidance.   Electronically Signed   By: Markus Daft M.D.   On: 05/12/2014 18:25   Ir US Guide Vasc Access Right  05/12/2014   INDICATION: 81 year old with end-stage renal disease and needs a catheter for hemodialysis.  EXAM: FLUOROSCOPIC AND ULTRASOUND GUIDED PLACEMENT OF A TUNNELED DIALYSIS CATHETER  Physician: Stephan Minister. Henn, MD  FLUOROSCOPY TIME:  30 seconds, 20.3 mGy  MEDICATIONS: 3 g Ancef. 2 mg versed, 100  mcg fentanyl. A radiology nurse monitored the patient for moderate sedation.  As antibiotic prophylaxis, Ancef was ordered pre-procedure and administered intravenously within one hour of incision.  ANESTHESIA/SEDATION: Moderate sedation time: 20 minutes  PROCEDURE: Informed consent was obtained for placement of a tunneled dialysis catheter. The patient was placed supine on the interventional table. Ultrasound confirmed a patent right internal jugularvein. Ultrasound images were obtained for documentation. The right side of the neck was prepped and draped in a sterile fashion. The right side of the neck was anesthetized with 1% lidocaine. Maximal barrier sterile technique was utilized including caps, mask, sterile gowns, sterile gloves, sterile drape, hand hygiene and skin antiseptic. A small incision was made with #11 blade scalpel. A 21 gauge needle directed into the right internal jugular vein with ultrasound guidance. A micropuncture dilator set was placed. A 23 cm tip to cuff HemoSplit catheter was selected. The skin below the right  clavicle was anesthetized and a small incision was made with an #11 blade scalpel. A subcutaneous tunnel was formed to the vein dermatotomy site. The catheter was brought through the tunnel. The vein dermatotomy site was dilated to accommodate a peel-away sheath. The catheter was placed through the peel-away sheath and directed into the central venous structures. The tip of the catheter was placed at the superior cavoatrial junction with fluoroscopy. Fluoroscopic images were obtained for documentation. Both lumens were found to aspirate and flush well. The proper amount of heparin was flushed in both lumens. The vein dermatotomy site was closed using a single layer of absorbable suture and Dermabond. Gel-Foam was placed in the subcutaneous tract for hemostasis. The catheter was secured to the skin using Prolene suture.  FINDINGS: Catheter tip at the superior cavoatrial junction.  Estimated blood loss: Minimal  COMPLICATIONS: None  IMPRESSION: Successful placement of a right chest tunneled dialysis catheter using ultrasound and fluoroscopic guidance.   Electronically Signed   By: Markus Daft M.D.   On: 05/12/2014 18:25   Dg Chest Port 1 View  05/10/2014   CLINICAL DATA:  Central line placement  EXAM: PORTABLE CHEST - 1 VIEW  COMPARISON:  05/01/2014  FINDINGS: New right IJ central line, a dialysis catheter. The tip is at the lower SVC.  No cardiomegaly. Aortic and hilar contours are stable and there is no new mediastinal widening. No pneumothorax. A trace right pleural effusion is present over the elevated right diaphragm, chronic based on CT 05/02/2014.  IMPRESSION: New right IJ dialysis catheter, tip at the SVC level. No pneumothorax.   Electronically Signed   By: Monte Fantasia M.D.   On: 05/10/2014 16:26   Dg Swallowing Func-speech Pathology  05/15/2014    Objective Swallowing Evaluation:    Patient Details  Name: RYLAN MINE MRN: [id number] Date of Birth: [birth year]  Today's Date: 05/15/2014 Time: SLP Start Time  (ACUTE ONLY): 1135-SLP Stop Time (ACUTE ONLY): 1201 SLP Time Calculation (min) (ACUTE ONLY): 26 min  Past Medical History:  Past Medical History  Diagnosis Date  . Diabetes mellitus without complication   . Hypertension   . Blind left eye    Past Surgical History: No past surgical history on file. HPI:  Other Pertinent Information: 81 y.o. male, with past medical history of  diabetes mellitus, CVA, hyperlipidemia, altered mental status, shunt  admitted with increased lethargy and confusion than baseline. Pt found to  be in acute renal failure. CT head with no acute findings. CXR No acute  cardiopulmonary disease. MBS 06/17/12 Mild oral phase dysphagia;Mild  pharyngeal phase dysphagia;Mild cervical esophageal phase dysphagia;  cervical osteophytes at C2-C3, C3-C4, C4-C5, C5-C6 mildly impairing barium  flow into esophagus (C5-C6 mostly impacting swallow). Dys 3 and thin  liquids recommended.  No Data Recorded  Assessment / Plan / Recommendation CHL IP CLINICAL IMPRESSIONS 05/15/2014  Therapy Diagnosis Moderate oral phase dysphagia;Moderate pharyngeal phase  dysphagia  Clinical Impression Pt displaying improved mentation during MBS procedure  as compared to drowsiness and difficultly with PO this am. Fluctuating  mentation concerning putting patient at risk for episodic aspiration. Pt  exhibits moderate oral pharyngeal dysphagia. Oral phase deficits  characterized by lingual pumping, decreased timely AP transfer, and poor  bolus control. Pharyngeal phase significant for delayed swallow initiation  across all consistencies at the level of valleculae with puree, regular,  and thin liquid via tsp. Swallow initiation of thin liquid by cup or straw  delaying more to the level of pyriform sinuses. No penetration or  aspiration evidenced throughout study. Based upon patients variation in  cognitive functioning and episodic difficulty with PO recommend dysphagia  1 diet wtih thin liquids.  Meds: crushed with puree. Continue  swallow  intervention for diet tolerance and potential diet advancement.        CHL IP TREATMENT RECOMMENDATION 05/15/2014  Treatment Recommendations Therapy as outlined in treatment plan below     CHL IP DIET RECOMMENDATION 05/15/2014  SLP Diet Recommendations Dysphagia 1 (Puree);Thin  Liquid Administration via (None)  Medication Administration Crushed with puree  Compensations Slow rate;Small sips/bites  Postural Changes and/or Swallow Maneuvers (None)     CHL IP OTHER RECOMMENDATIONS 05/15/2014  Recommended Consults (None)  Oral Care Recommendations Oral care BID  Other Recommendations (None)     CHL IP FOLLOW UP RECOMMENDATIONS 05/13/2014  Follow up Recommendations None     CHL IP FREQUENCY AND DURATION 05/15/2014  Speech Therapy Frequency (ACUTE ONLY) (None)  Treatment Duration 2 weeks         SLP Swallow Goals No flowsheet data found.  No flowsheet data found.    No flowsheet data found.   CHL IP ORAL PHASE 05/15/2014  Lips (None)  Tongue (None)  Mucous membranes (None)  Nutritional status (None)  Other (None)  Oxygen therapy (None)  Oral Phase Impaired  Oral - Pudding Teaspoon (None)  Oral - Pudding Cup (None)  Oral - Honey Teaspoon (None)  Oral - Honey Cup (None)  Oral - Honey Syringe (None)  Oral - Nectar Teaspoon (None)  Oral - Nectar Cup (None)  Oral - Nectar Straw (None)  Oral - Nectar Syringe (None)  Oral - Ice Chips (None)  Oral - Thin Teaspoon (None)  Oral - Thin Cup (None)  Oral - Thin Straw (None)  Oral - Thin Syringe (None)  Oral - Puree (None)  Oral - Mechanical Soft (None)  Oral - Regular (None)  Oral - Multi-consistency (None)  Oral - Pill (None)  Oral Phase - Comment (None)      CHL IP PHARYNGEAL PHASE 05/15/2014  Pharyngeal Phase Impaired  Pharyngeal - Pudding Teaspoon (None)  Penetration/Aspiration details (pudding teaspoon) (None)  Pharyngeal - Pudding Cup (None)  Penetration/Aspiration details (pudding cup) (None)  Pharyngeal - Honey Teaspoon (None)  Penetration/Aspiration details (honey teaspoon)  (None)  Pharyngeal - Honey Cup (None)  Penetration/Aspiration details (honey cup) (None)  Pharyngeal - Honey Syringe (None)  Penetration/Aspiration details (honey syringe) (None)  Pharyngeal - Nectar Teaspoon (None)  Penetration/Aspiration details (nectar teaspoon) (None)  Pharyngeal - Nectar Cup (None)  Penetration/Aspiration details (nectar cup) (None)  Pharyngeal - Nectar Straw (None)  Penetration/Aspiration details (nectar straw) (None)  Pharyngeal - Nectar Syringe (None)  Penetration/Aspiration details (nectar syringe) (None)  Pharyngeal - Ice Chips (None)  Penetration/Aspiration details (ice chips) (None)  Pharyngeal - Thin Teaspoon (None)  Penetration/Aspiration details (thin teaspoon) (None)  Pharyngeal - Thin Cup (None)  Penetration/Aspiration details (thin cup) (None)  Pharyngeal - Thin Straw (None)  Penetration/Aspiration details (thin straw) (None)  Pharyngeal - Thin Syringe (None)  Penetration/Aspiration details (thin syringe') (None)  Pharyngeal - Puree (None)  Penetration/Aspiration details (puree) (None)  Pharyngeal - Mechanical Soft (None)  Penetration/Aspiration details (mechanical soft) (None)  Pharyngeal - Regular (None)  Penetration/Aspiration details (regular) (None)  Pharyngeal - Multi-consistency (None)  Penetration/Aspiration details (multi-consistency) (None)  Pharyngeal - Pill (None)  Penetration/Aspiration details (pill) (None)  Pharyngeal Comment (None)     CHL IP CERVICAL ESOPHAGEAL PHASE 06/17/2012  Cervical Esophageal Phase Impaired  Pudding Teaspoon (None)  Pudding Cup (None)  Honey Teaspoon (None)  Honey Cup (None)  Honey Straw (None)  Nectar Teaspoon (None)  Nectar Cup Reduced cricopharyngeal relaxation  Nectar Straw (None)  Nectar Sippy Cup (None)  Thin Teaspoon (None)  Thin Cup Reduced cricopharyngeal relaxation  Thin Straw Reduced cricopharyngeal relaxation  Thin Sippy Cup (None)  Cervical Esophageal Comment appearance of cervical osteophytes impair  barium flow mildly- barium  appears to flow around osteophyte, decreased  UES opening noted intermittently, barium tablet readily transited into  esophagus to stomoach without delay    CHL IP GO 04/19/2013  Functional Assessment Tool Used skilled clinical judgment  Functional Limitations Swallowing  Swallow Current Status [id number]) CI  Swallow Goal Status [id number]) CI  Swallow Discharge Status [id number]) (None)  Motor Speech Current Status [id number]) (None)  Motor Speech Goal Status [id number]) (None)  Motor Speech Goal Status [id number]) (None)  Spoken Language Comprehension Current Status [id number]) (None)  Spoken Language Comprehension Goal Status [id number]) (None)  Spoken Language Comprehension Discharge Status [id number]) (None)  Spoken Language Expression Current Status [id number]) (None)  Spoken Language Expression Goal Status [id number]) (None)  Spoken Language Expression Discharge Status [phone]) (None)  Attention Current Status [id number]) (None)  Attention Goal Status [id number]) (None)  Attention Discharge Status [id number]) (None)  Memory Current Status [id number]) (None)  Memory Goal Status [id number]) (None)  Memory Discharge Status [id number]) (None)  Voice Current Status [id number]) (None)  Voice Goal Status [id number]) (None)  Voice Discharge Status [id number]) (None)  Other Speech-Language Pathology Functional Limitation [id number]) (None)  Other Speech-Language Pathology Functional Limitation Goal Status [id number])  (None)  Other Speech-Language Pathology Functional Limitation Discharge Status  [phone]) (None)    Arvil Chaco MA, CCC-SLP Acute Care Speech Language Pathologist          Arvil Chaco E 05/15/2014, 1:58 PM    US Abdomen Limited Ruq  05/18/2014   CLINICAL DATA:  Right upper quadrant abdominal pain. History of hypertension and diabetes. Initial encounter.  EXAM: US ABDOMEN LIMITED - RIGHT UPPER QUADRANT  COMPARISON:  Prior examination 05/01/2014.  FINDINGS: Gallbladder:  Examination remains limited by body habitus and bowel gas. Again demonstrated is a gallstone in the gallbladder neck  with decreased mobility. This measures approximately 1.4 cm. No gallbladder wall thickening or sonographic Murphy's sign.  Common bile duct:  Diameter: 2.6 mm  Liver:  Coarse echogenic most consistent with steatosis. No focal lesions demonstrated.  IMPRESSION: 1. Cholelithiasis without evidence of gallbladder wall thickening or biliary dilatation. 2. No significant change from prior study.   Electronically Signed   By: Caryl Comes.D.  On: 05/18/2014 19:37   US Abdomen Limited Ruq  05/01/2014   CLINICAL DATA:  Elevated liver function tests.  Hypertension.  EXAM: US ABDOMEN LIMITED - RIGHT UPPER QUADRANT  COMPARISON:  None.  FINDINGS: Gallbladder:  1.0 cm gallstone lodged in the gallbladder neck. The remainder the gallbladder was poorly visualized due to overlying bowel gas. No gross additional calculated, wall thickening or pericholecystic fluid. Assessment for tenderness cannot be determined due to the fact that the patient is unresponsive.  Common bile duct:  Diameter: 2.5 mm  Liver:  No focal lesion identified. Within normal limits in parenchymal echogenicity.  IMPRESSION: 1. Limited examination due to overlying bowel gas and inability of the patient to cooperate. 2. 1.0 cm gallstone lodged in the gallbladder neck with no evidence of cholecystitis. Mid   Electronically Signed   By: Claudie Revering M.D.   On: 05/01/2014 14:11    Microbiology: No results found for this or any previous visit (from the past 240 hour(s)).   Labs: Basic Metabolic Panel:  Recent Labs Lab 05/20/14 0413 05/21/14 0720 05/22/14 0415 05/23/14 0453 05/24/14 0546  NA 140 139 141 141 142  K 3.9 4.2 4.2 4.3 4.2  CL 104 105 105 106 107  CO2 26 25 24 24 22   GLUCOSE 108* 102* 104* 92 93  BUN 45* 52* 54* 59* 58*  CREATININE 5.23* 5.51* 5.41* 5.42* 5.16*  CALCIUM 8.1* 8.1* 8.3* 8.2* 8.3*  MG 1.9 1.9 2.0 1.9 1.9  PHOS 4.5 4.6 4.8* 4.9* 4.9*   Liver Function Tests:  Recent Labs Lab 05/18/14 0437  05/20/14 0413  05/21/14 0720 05/22/14 0415 05/23/14 0453 05/24/14 0546  AST 35  --   --   --   --   --   --   ALT 17  --   --   --   --   --   --   ALKPHOS 56  --   --   --   --   --   --   BILITOT 0.7  --   --   --   --   --   --   PROT 5.6*  --   --   --   --   --   --   ALBUMIN 2.0*  2.1*  < > 2.0* 2.2* 2.2* 2.3* 2.3*  < > = values in this interval not displayed. No results for input(s): LIPASE, AMYLASE in the last 168 hours. No results for input(s): AMMONIA in the last 168 hours. CBC:  Recent Labs Lab 05/18/14 0437  WBC 13.3*  HGB 9.4*  HCT 28.7*  MCV 82.0  PLT 187   Cardiac Enzymes: No results for input(s): CKTOTAL, CKMB, CKMBINDEX, TROPONINI in the last 168 hours. BNP: BNP (last 3 results) No results for input(s): BNP in the last 8760 hours.  ProBNP (last 3 results) No results for input(s): PROBNP in the last 8760 hours.  CBG:  Recent Labs Lab 05/23/14 2015 05/24/14 0003 05/24/14 0427 05/24/14 0808 05/24/14 1204  GLUCAP 110* 114* 105* 93 101*       Signed:  Waneta Fitting  [facility] 05/24/2014, 1:00 PM

## 2014-05-24 NOTE — Progress Notes (Signed)
SLP Cancellation Note  Patient Details Name: Curtis Brock MRN: [id number] DOB: 12-Mar-1925   Cancelled treatment:       Reason Eval/Treat Not Completed: Other (comment) (pt working with hospital personnel in room, will reattempt at another time.  )  Luanna Salk, [place] [facility] SLP [phone]

## 2014-05-24 NOTE — Clinical Social Work Placement (Signed)
   CLINICAL SOCIAL WORK PLACEMENT  NOTE  Date:  05/24/2014  Patient Details  Name: Curtis Brock MRN: [id number] Date of Birth: March 11, 1925  Clinical Social Work is seeking post-discharge placement for this patient at the [place] level of care (*CSW will initial, date and re-position this form in  chart as items are completed):  Yes   Patient/family provided with [place] Work Department's list of facilities offering this level of care within the geographic area requested by the patient (or if unable, by the patient's family).  Yes   Patient/family informed of their freedom to choose among providers that offer the needed level of care, that participate in Medicare, Medicaid or managed care program needed by the patient, have an available bed and are willing to accept the patient.  Yes   Patient/family informed of [facility]'s ownership interest in [facility] and [facility], as well as of the fact that they are under no obligation to receive care at these facilities.  PASRR submitted to EDS (on) in 2014     PASRR number received in  2014     [place] number confirmed on 05/04/14     FL2 transmitted to all facilities in geographic area requested by pt/family on 05/04/14     FL2 transmitted to all facilities within larger geographic area on       Patient informed that his/her managed care company has contracts with or will negotiate with certain facilities, including the following:         05/23/14  Patient/family informed of bed offers received.  Patient chooses bed at  [facility]     Physician recommends and patient chooses bed at      Patient to be transferred to  [facility] on  05/24/14.  Patient to be transferred to facility by  ambulance     Patient family notified on  Mrs. [name]' husband, (who visited patient 5/10 and advised CSW of facility decision) of transfer.  Name of family member notified:         PHYSICIAN Please sign FL2     Additional Comment:    _______________________________________________ Sable Feil, LCSW 05/24/2014, 6:39 PM

## 2014-05-24 NOTE — Progress Notes (Signed)
[facility] Progress Note   Subjective: No acute events overnight.  Last HD#4 on 5/3, has been over 1 week since required HD. Overall encouraging sign.  Today patient sitting upright in chair at bedside with daughter in room. He tolerated breakfast well. Per notes and PT, plans to go to SNF for rehab upon discharge. Previously was living at home with daughter before hospitalization. Foley in, improved UOP 1175cc to 1275cc yesterday. Admits persistent lower ext edema. Denies any CP, SOB, nausea / vomiting, abdominal pain.   Filed Vitals:   05/23/14 0836 05/23/14 1651 05/23/14 2016 05/24/14 0430  BP: 150/59 137/63 139/60 147/71  Pulse: 80 79 76 78  Temp: 98.7 F (37.1 C) 98 F (36.7 C) 97.5 F (36.4 C) 98 F (36.7 C)  TempSrc: Oral Oral Oral Oral  Resp: 18 18 17 17   Height:   5' (1.524 m)   Weight:   244 lb 4.8 oz (110.814 kg)   SpO2: 100% 100% 100% 100%   I/O last 3 completed shifts: In: 600 [P.O.:600] Out: 1975 [Urine:1975]    Exam: Gen - elderly frail 63 yr AAM. Sitting up in chair at bedside, comfortable and cooperative, NAD Heart: Regular S1S2 No S3  no murmur Lungs - Anteriorly clear bilaterally without significant crackles. Mild diminished air movement bases. Non-labored Abd - obese, soft, NTND. + BS Ext - Stable bilateral lower ext +1 pitting edema ankles to knees, including post thighs. Feet in protective boots Neuro - Awake, alert, oriented ([place], 2016, month, president) SKIN: R IJ TDC dressing intact (tunnelled 4/28) Nontender, dry dressing, no swelling or erythema. GU - Foley with yellow urine   Recent Labs Lab 05/22/14 0415 05/23/14 0453 05/24/14 0546  NA 141 141 142  K 4.2 4.3 4.2  CL 105 106 107  CO2 24 24 22   GLUCOSE 104* 92 93  BUN 54* 59* 58*  CREATININE 5.41* 5.42* 5.16*  CALCIUM 8.3* 8.2* 8.3*  PHOS 4.8* 4.9* 4.9*    Recent Labs Lab 05/18/14 0437  05/22/14 0415 05/23/14 0453 05/24/14 0546  AST 35  --   --    --   --   ALT 17  --   --   --   --   ALKPHOS 56  --   --   --   --   BILITOT 0.7  --   --   --   --   PROT 5.6*  --   --   --   --   ALBUMIN 2.0*  2.1*  < > 2.2* 2.3* 2.3*  < > = values in this interval not displayed.  Recent Labs Lab 05/18/14 0437  WBC 13.3*  HGB 9.4*  HCT 28.7*  MCV 82.0  PLT 187    Medications . sodium chloride   Intravenous Once  . aspirin EC  81 mg Oral Daily  . bisacodyl  10 mg Rectal Once  . brimonidine  1 drop Right Eye BID  . docusate  100 mg Oral BID  . feeding supplement (NEPRO CARB STEADY)  237 mL Oral BID  . feeding supplement (PRO-STAT SUGAR FREE 64)  30 mL Oral Daily  . furosemide  40 mg Oral Daily  . heparin  5,000 Units Subcutaneous 3 times per day  . multivitamin  1 tablet Oral QHS  . nystatin   Topical BID  . sodium chloride  3 mL Intravenous Q12H     acetaminophen, acetaminophen, bisacodyl, hydrALAZINE, MUSCLE RUB, ondansetron **OR** ondansetron (ZOFRAN) IV,  senna-docusate, sorbitol  Background: 3 yr M PMH controlled DM (A1c 6.5), h/o CVA, HLD, HTN, L-eye blindness, admitted on 4/17 after being found at home alone more lethargic (AMS). In ED,  severe sepsis with hypotension, also found to have elevated SCr 2.02 (b/l 1) and oliguria, with progressive worsening in renal function.  Renal consulted for ARF (on 4/18) and pt had 1st HD 4/28 with creatinine close to 10 and oligoanuric. Pulled out his temp cath and rec'd [facility] on 4/28 in IR.  Assessment: 1. AKI shock/ ARB - non-oliguric 1. Baseline SCr 1.0 (last checked 1.01, 01/2014) 2. Improved SCr 5.42 > 5.16 today (5/10). Previously at plateau, seems improved following low dose PO Lasix trial. 3. Increase Lasix PO from 40mg  daily to BID today (given still mildly HTN BPs (140-150s/70s), inc volume status, SCr with good response). May titrate as needed. Seems to respond to 40mg  4. Overall encouraging renal functional improvement, no req HD x 1 week, cont improved UOP. Unclear timeline for  further renal recovery. 5. Hold off on HD#5 at this time (last HD on 5/3). No indications, electrolytes stable (K 4.2, Mag 1.9, Phos 4.9 - minimally elevated, bicarb 22) 6. No longer considered HD dependent given gradual improvement, kidneys seem to be filtering appropriately. Again, per prior notes and repeat discussion today. HD initiated as temporary measure only, patient and family had expressed wishes to not receive HD long-term. Today recommend to remove Tunneled HD Cath (placed 4/28), to reduce potential infection risk and pt seems unlikely to req repeat HD at this time. If future req HD, then would repeat discussion with pt and family, any future episode of worsening kidney function would likely entail long-term HD. 1. See family conversation in Note on 4/27. 2. Shock/ low BP resolved, w/u neg. Per primary. 3. Aspiration PNA - per primary. Completed course Augmentin PO 4. HTN - Hold ARB. 5. Blind L eye 6. DM - controlled. Per primary 7. Hx falls / debility / H/o CVA 8. Dyshagia - pureed diet, SLP following 9. AMS - Resolved  Disposition - Renal consult service to sign off today, given continued gradual renal function improvement and x 7 days since last HD, hopeful that pt will continue to recover and not req HD for this episode. Recommend removal of Tunneled HD Cath (reduce infection risk). Regarding diuresis, would continue Lasix PO 40mg  1-2x daily PRN. Pt to go to SNF.  [name], [place], PGY-2 (Currently working with [facility] - Renal Service)  05/24/2014, 8:16 AM   Patient seen and examined, agree with above note with above modifications. Patient stable- creatinine slightly improved.  No further need for HD- is having reasonable UOP with PO lasix.  Will remove HD cath- pt did not desire prolonged therapy - primary tam can proceed with plans to transfer to SNF- will not need follow up with nephrology as OP- concentrate mainly on QOL at this time Corliss Parish, MD 05/24/2014

## 2014-05-24 NOTE — Procedures (Signed)
Pt with resolved Renal failure  No longer needs dialysis  Right chest/ PC area prepped with Cloraprep  Suture removed  Cuff was already exposed at the skin.  Cath was easily removed  Pressure was held to ensure no bleeding through the tract.  No lidocaine needed.  Pt tolerated procedure well.

## 2014-05-26 DIAGNOSIS — N179 Acute kidney failure, unspecified: Secondary | ICD-10-CM | POA: Diagnosis not present

## 2014-05-26 DIAGNOSIS — R1310 Dysphagia, unspecified: Secondary | ICD-10-CM | POA: Diagnosis not present

## 2014-05-26 DIAGNOSIS — I270 Primary pulmonary hypertension: Secondary | ICD-10-CM | POA: Diagnosis not present

## 2014-05-26 DIAGNOSIS — N185 Chronic kidney disease, stage 5: Secondary | ICD-10-CM | POA: Diagnosis not present

## 2014-05-26 DIAGNOSIS — J690 Pneumonitis due to inhalation of food and vomit: Secondary | ICD-10-CM | POA: Diagnosis not present

## 2014-06-02 DIAGNOSIS — R531 Weakness: Secondary | ICD-10-CM | POA: Diagnosis not present

## 2014-06-07 DIAGNOSIS — M79673 Pain in unspecified foot: Secondary | ICD-10-CM | POA: Diagnosis not present

## 2014-06-16 DIAGNOSIS — M79673 Pain in unspecified foot: Secondary | ICD-10-CM | POA: Diagnosis not present

## 2014-06-16 DIAGNOSIS — L89152 Pressure ulcer of sacral region, stage 2: Secondary | ICD-10-CM | POA: Diagnosis not present

## 2014-06-16 DIAGNOSIS — N184 Chronic kidney disease, stage 4 (severe): Secondary | ICD-10-CM | POA: Diagnosis not present

## 2014-06-16 DIAGNOSIS — I639 Cerebral infarction, unspecified: Secondary | ICD-10-CM | POA: Diagnosis not present

## 2014-06-16 DIAGNOSIS — F0391 Unspecified dementia with behavioral disturbance: Secondary | ICD-10-CM | POA: Diagnosis not present

## 2014-07-24 DIAGNOSIS — I10 Essential (primary) hypertension: Secondary | ICD-10-CM | POA: Diagnosis not present

## 2014-07-24 DIAGNOSIS — R627 Adult failure to thrive: Secondary | ICD-10-CM | POA: Diagnosis not present

## 2014-07-24 DIAGNOSIS — I63019 Cerebral infarction due to thrombosis of unspecified vertebral artery: Secondary | ICD-10-CM | POA: Diagnosis not present

## 2014-07-24 DIAGNOSIS — N179 Acute kidney failure, unspecified: Secondary | ICD-10-CM | POA: Diagnosis not present

## 2014-07-24 DIAGNOSIS — R4182 Altered mental status, unspecified: Secondary | ICD-10-CM | POA: Diagnosis not present

## 2014-07-25 DIAGNOSIS — I63019 Cerebral infarction due to thrombosis of unspecified vertebral artery: Secondary | ICD-10-CM | POA: Diagnosis not present

## 2014-07-25 DIAGNOSIS — R4182 Altered mental status, unspecified: Secondary | ICD-10-CM | POA: Diagnosis not present

## 2014-07-25 DIAGNOSIS — R627 Adult failure to thrive: Secondary | ICD-10-CM | POA: Diagnosis not present

## 2014-07-25 DIAGNOSIS — N179 Acute kidney failure, unspecified: Secondary | ICD-10-CM | POA: Diagnosis not present

## 2014-07-25 DIAGNOSIS — I10 Essential (primary) hypertension: Secondary | ICD-10-CM | POA: Diagnosis not present

## 2014-07-26 ENCOUNTER — Ambulatory Visit: Payer: Medicare Other | Admitting: Emergency Medicine

## 2014-07-27 DIAGNOSIS — I10 Essential (primary) hypertension: Secondary | ICD-10-CM | POA: Diagnosis not present

## 2014-07-27 DIAGNOSIS — N179 Acute kidney failure, unspecified: Secondary | ICD-10-CM | POA: Diagnosis not present

## 2014-07-27 DIAGNOSIS — R627 Adult failure to thrive: Secondary | ICD-10-CM | POA: Diagnosis not present

## 2014-07-27 DIAGNOSIS — R4182 Altered mental status, unspecified: Secondary | ICD-10-CM | POA: Diagnosis not present

## 2014-07-27 DIAGNOSIS — I63019 Cerebral infarction due to thrombosis of unspecified vertebral artery: Secondary | ICD-10-CM | POA: Diagnosis not present

## 2014-07-28 DIAGNOSIS — R4182 Altered mental status, unspecified: Secondary | ICD-10-CM | POA: Diagnosis not present

## 2014-07-28 DIAGNOSIS — R627 Adult failure to thrive: Secondary | ICD-10-CM | POA: Diagnosis not present

## 2014-07-28 DIAGNOSIS — I63019 Cerebral infarction due to thrombosis of unspecified vertebral artery: Secondary | ICD-10-CM | POA: Diagnosis not present

## 2014-07-28 DIAGNOSIS — N179 Acute kidney failure, unspecified: Secondary | ICD-10-CM | POA: Diagnosis not present

## 2014-07-28 DIAGNOSIS — I10 Essential (primary) hypertension: Secondary | ICD-10-CM | POA: Diagnosis not present

## 2014-07-29 ENCOUNTER — Telehealth: Payer: Self-pay | Admitting: Emergency Medicine

## 2014-07-29 DIAGNOSIS — I63019 Cerebral infarction due to thrombosis of unspecified vertebral artery: Secondary | ICD-10-CM | POA: Diagnosis not present

## 2014-07-29 DIAGNOSIS — I10 Essential (primary) hypertension: Secondary | ICD-10-CM | POA: Diagnosis not present

## 2014-07-29 DIAGNOSIS — N179 Acute kidney failure, unspecified: Secondary | ICD-10-CM | POA: Diagnosis not present

## 2014-07-29 DIAGNOSIS — R627 Adult failure to thrive: Secondary | ICD-10-CM | POA: Diagnosis not present

## 2014-07-29 DIAGNOSIS — R4182 Altered mental status, unspecified: Secondary | ICD-10-CM | POA: Diagnosis not present

## 2014-07-29 NOTE — Telephone Encounter (Signed)
[facility] (home health) called and needs verbal orders for the patient in the following areas. Tranfers, adaptive equitment training adl home exercise, ok to discharge OT when patient reaches max potential.  [phone]

## 2014-08-01 ENCOUNTER — Telehealth: Payer: Self-pay

## 2014-08-01 NOTE — Telephone Encounter (Signed)
Left detailed message with verbal order.  

## 2014-08-01 NOTE — Telephone Encounter (Signed)
Please give verbal order for Curtis Brock to continue care at home but

## 2014-08-01 NOTE — Telephone Encounter (Signed)
Message in duplicate account.

## 2014-08-01 NOTE — Telephone Encounter (Signed)
Jasmine L Dunlap at 07/29/2014 1:56 PM     Status: Signed       Expand All Collapse All   [facility] (home health) called and needs verbal orders for the patient in the following areas. Tranfers, adaptive equitment training adl home exercise, ok to discharge OT when patient reaches max potential.  [phone]       [facility] to give verbal orders?

## 2014-08-02 DIAGNOSIS — I63019 Cerebral infarction due to thrombosis of unspecified vertebral artery: Secondary | ICD-10-CM | POA: Diagnosis not present

## 2014-08-02 DIAGNOSIS — R4182 Altered mental status, unspecified: Secondary | ICD-10-CM | POA: Diagnosis not present

## 2014-08-02 DIAGNOSIS — N179 Acute kidney failure, unspecified: Secondary | ICD-10-CM | POA: Diagnosis not present

## 2014-08-02 DIAGNOSIS — I10 Essential (primary) hypertension: Secondary | ICD-10-CM | POA: Diagnosis not present

## 2014-08-02 DIAGNOSIS — R627 Adult failure to thrive: Secondary | ICD-10-CM | POA: Diagnosis not present

## 2014-08-04 DIAGNOSIS — R627 Adult failure to thrive: Secondary | ICD-10-CM | POA: Diagnosis not present

## 2014-08-04 DIAGNOSIS — I10 Essential (primary) hypertension: Secondary | ICD-10-CM | POA: Diagnosis not present

## 2014-08-04 DIAGNOSIS — N179 Acute kidney failure, unspecified: Secondary | ICD-10-CM | POA: Diagnosis not present

## 2014-08-04 DIAGNOSIS — R4182 Altered mental status, unspecified: Secondary | ICD-10-CM | POA: Diagnosis not present

## 2014-08-04 DIAGNOSIS — I63019 Cerebral infarction due to thrombosis of unspecified vertebral artery: Secondary | ICD-10-CM | POA: Diagnosis not present

## 2014-08-05 DIAGNOSIS — R627 Adult failure to thrive: Secondary | ICD-10-CM | POA: Diagnosis not present

## 2014-08-05 DIAGNOSIS — I63019 Cerebral infarction due to thrombosis of unspecified vertebral artery: Secondary | ICD-10-CM | POA: Diagnosis not present

## 2014-08-05 DIAGNOSIS — R4182 Altered mental status, unspecified: Secondary | ICD-10-CM | POA: Diagnosis not present

## 2014-08-05 DIAGNOSIS — I10 Essential (primary) hypertension: Secondary | ICD-10-CM | POA: Diagnosis not present

## 2014-08-05 DIAGNOSIS — N179 Acute kidney failure, unspecified: Secondary | ICD-10-CM | POA: Diagnosis not present

## 2014-08-08 DIAGNOSIS — N179 Acute kidney failure, unspecified: Secondary | ICD-10-CM | POA: Diagnosis not present

## 2014-08-08 DIAGNOSIS — I63019 Cerebral infarction due to thrombosis of unspecified vertebral artery: Secondary | ICD-10-CM | POA: Diagnosis not present

## 2014-08-08 DIAGNOSIS — I10 Essential (primary) hypertension: Secondary | ICD-10-CM | POA: Diagnosis not present

## 2014-08-08 DIAGNOSIS — R4182 Altered mental status, unspecified: Secondary | ICD-10-CM | POA: Diagnosis not present

## 2014-08-08 DIAGNOSIS — R627 Adult failure to thrive: Secondary | ICD-10-CM | POA: Diagnosis not present

## 2014-08-09 DIAGNOSIS — R4182 Altered mental status, unspecified: Secondary | ICD-10-CM | POA: Diagnosis not present

## 2014-08-09 DIAGNOSIS — N179 Acute kidney failure, unspecified: Secondary | ICD-10-CM | POA: Diagnosis not present

## 2014-08-09 DIAGNOSIS — R627 Adult failure to thrive: Secondary | ICD-10-CM | POA: Diagnosis not present

## 2014-08-09 DIAGNOSIS — I63019 Cerebral infarction due to thrombosis of unspecified vertebral artery: Secondary | ICD-10-CM | POA: Diagnosis not present

## 2014-08-09 DIAGNOSIS — I10 Essential (primary) hypertension: Secondary | ICD-10-CM | POA: Diagnosis not present

## 2014-08-10 DIAGNOSIS — R4182 Altered mental status, unspecified: Secondary | ICD-10-CM | POA: Diagnosis not present

## 2014-08-10 DIAGNOSIS — R627 Adult failure to thrive: Secondary | ICD-10-CM | POA: Diagnosis not present

## 2014-08-10 DIAGNOSIS — I10 Essential (primary) hypertension: Secondary | ICD-10-CM | POA: Diagnosis not present

## 2014-08-10 DIAGNOSIS — N179 Acute kidney failure, unspecified: Secondary | ICD-10-CM | POA: Diagnosis not present

## 2014-08-10 DIAGNOSIS — I63019 Cerebral infarction due to thrombosis of unspecified vertebral artery: Secondary | ICD-10-CM | POA: Diagnosis not present

## 2014-08-11 DIAGNOSIS — R4182 Altered mental status, unspecified: Secondary | ICD-10-CM | POA: Diagnosis not present

## 2014-08-11 DIAGNOSIS — R627 Adult failure to thrive: Secondary | ICD-10-CM | POA: Diagnosis not present

## 2014-08-11 DIAGNOSIS — I63019 Cerebral infarction due to thrombosis of unspecified vertebral artery: Secondary | ICD-10-CM | POA: Diagnosis not present

## 2014-08-11 DIAGNOSIS — N179 Acute kidney failure, unspecified: Secondary | ICD-10-CM | POA: Diagnosis not present

## 2014-08-11 DIAGNOSIS — I10 Essential (primary) hypertension: Secondary | ICD-10-CM | POA: Diagnosis not present

## 2014-08-12 DIAGNOSIS — R4182 Altered mental status, unspecified: Secondary | ICD-10-CM | POA: Diagnosis not present

## 2014-08-12 DIAGNOSIS — R627 Adult failure to thrive: Secondary | ICD-10-CM | POA: Diagnosis not present

## 2014-08-12 DIAGNOSIS — N179 Acute kidney failure, unspecified: Secondary | ICD-10-CM | POA: Diagnosis not present

## 2014-08-12 DIAGNOSIS — I63019 Cerebral infarction due to thrombosis of unspecified vertebral artery: Secondary | ICD-10-CM | POA: Diagnosis not present

## 2014-08-12 DIAGNOSIS — I10 Essential (primary) hypertension: Secondary | ICD-10-CM | POA: Diagnosis not present

## 2014-08-16 DIAGNOSIS — R4182 Altered mental status, unspecified: Secondary | ICD-10-CM | POA: Diagnosis not present

## 2014-08-16 DIAGNOSIS — R627 Adult failure to thrive: Secondary | ICD-10-CM | POA: Diagnosis not present

## 2014-08-16 DIAGNOSIS — N179 Acute kidney failure, unspecified: Secondary | ICD-10-CM | POA: Diagnosis not present

## 2014-08-16 DIAGNOSIS — I10 Essential (primary) hypertension: Secondary | ICD-10-CM | POA: Diagnosis not present

## 2014-08-16 DIAGNOSIS — I63019 Cerebral infarction due to thrombosis of unspecified vertebral artery: Secondary | ICD-10-CM | POA: Diagnosis not present

## 2014-08-17 ENCOUNTER — Telehealth: Payer: Self-pay

## 2014-08-17 DIAGNOSIS — N179 Acute kidney failure, unspecified: Secondary | ICD-10-CM | POA: Diagnosis not present

## 2014-08-17 DIAGNOSIS — R4182 Altered mental status, unspecified: Secondary | ICD-10-CM | POA: Diagnosis not present

## 2014-08-17 DIAGNOSIS — I63019 Cerebral infarction due to thrombosis of unspecified vertebral artery: Secondary | ICD-10-CM | POA: Diagnosis not present

## 2014-08-17 DIAGNOSIS — I10 Essential (primary) hypertension: Secondary | ICD-10-CM | POA: Diagnosis not present

## 2014-08-17 DIAGNOSIS — R627 Adult failure to thrive: Secondary | ICD-10-CM | POA: Diagnosis not present

## 2014-08-17 NOTE — Telephone Encounter (Signed)
I have not seen the patient since he was hospitalized with sepsis. If he is feeling well I would not change any of his medications. If he feels lightheaded as though he will faint he needs to be seen in the emergency room so they can recheck his kidney function tests. It doesn't appear he has had a checkup since discharge. It would be good if he could be seen at the emergency room so blood testing could be done to check his sugar and kidney function. He also should be checked to be sure there is no source of infection. Please call Timmothy Sours at [facility] with that information

## 2014-08-17 NOTE — Telephone Encounter (Signed)
Curtis Brock from [facility] called giving these readings for  [place]. His resting heart rate is 102 and 124 while standing and dressing. 3 minutes later it is 102. His BP at rest is  92/58 and 118/70 while standing and dressing. 10 minutes later it is 102. His typical heart rate is 70-80.

## 2014-08-18 DIAGNOSIS — I10 Essential (primary) hypertension: Secondary | ICD-10-CM | POA: Diagnosis not present

## 2014-08-18 DIAGNOSIS — I63019 Cerebral infarction due to thrombosis of unspecified vertebral artery: Secondary | ICD-10-CM | POA: Diagnosis not present

## 2014-08-18 DIAGNOSIS — R627 Adult failure to thrive: Secondary | ICD-10-CM | POA: Diagnosis not present

## 2014-08-18 DIAGNOSIS — R4182 Altered mental status, unspecified: Secondary | ICD-10-CM | POA: Diagnosis not present

## 2014-08-18 DIAGNOSIS — N179 Acute kidney failure, unspecified: Secondary | ICD-10-CM | POA: Diagnosis not present

## 2014-08-19 ENCOUNTER — Telehealth: Payer: Self-pay

## 2014-08-19 DIAGNOSIS — I63019 Cerebral infarction due to thrombosis of unspecified vertebral artery: Secondary | ICD-10-CM | POA: Diagnosis not present

## 2014-08-19 DIAGNOSIS — R4182 Altered mental status, unspecified: Secondary | ICD-10-CM | POA: Diagnosis not present

## 2014-08-19 DIAGNOSIS — R627 Adult failure to thrive: Secondary | ICD-10-CM | POA: Diagnosis not present

## 2014-08-19 DIAGNOSIS — N179 Acute kidney failure, unspecified: Secondary | ICD-10-CM | POA: Diagnosis not present

## 2014-08-19 DIAGNOSIS — I10 Essential (primary) hypertension: Secondary | ICD-10-CM | POA: Diagnosis not present

## 2014-08-19 NOTE — Telephone Encounter (Signed)
Caprice Red, occupational therapist from [name] is calling to leave a message for Dr. Everlene Farrier. He is requesting an okay to extend the OT from once a week to twice a week. He would also like to modify the ADL and transfer goals. Please call! [phone]

## 2014-08-20 NOTE — Telephone Encounter (Signed)
Please give the okay for patient to continue occupational therapy. Please send me a copy of forms he needs signed to modify the ADL and transfer goals and I will be happy to sign them.

## 2014-08-23 ENCOUNTER — Telehealth: Payer: Self-pay

## 2014-08-23 NOTE — Telephone Encounter (Signed)
Teddy from [facility] called wanting  Dr. Everlene Farrier to extend Curtis Brock's PT-2 times a week, for the next 2 weeks. Please advise at [phone]

## 2014-08-24 DIAGNOSIS — R4182 Altered mental status, unspecified: Secondary | ICD-10-CM | POA: Diagnosis not present

## 2014-08-24 DIAGNOSIS — N179 Acute kidney failure, unspecified: Secondary | ICD-10-CM | POA: Diagnosis not present

## 2014-08-24 DIAGNOSIS — I10 Essential (primary) hypertension: Secondary | ICD-10-CM | POA: Diagnosis not present

## 2014-08-24 DIAGNOSIS — R627 Adult failure to thrive: Secondary | ICD-10-CM | POA: Diagnosis not present

## 2014-08-24 DIAGNOSIS — I63019 Cerebral infarction due to thrombosis of unspecified vertebral artery: Secondary | ICD-10-CM | POA: Diagnosis not present

## 2014-08-24 NOTE — Telephone Encounter (Signed)
Can we extend orders?

## 2014-08-24 NOTE — Telephone Encounter (Signed)
Fine to extend orders.

## 2014-08-25 DIAGNOSIS — R627 Adult failure to thrive: Secondary | ICD-10-CM | POA: Diagnosis not present

## 2014-08-25 DIAGNOSIS — I63019 Cerebral infarction due to thrombosis of unspecified vertebral artery: Secondary | ICD-10-CM | POA: Diagnosis not present

## 2014-08-25 DIAGNOSIS — N179 Acute kidney failure, unspecified: Secondary | ICD-10-CM | POA: Diagnosis not present

## 2014-08-25 DIAGNOSIS — R4182 Altered mental status, unspecified: Secondary | ICD-10-CM | POA: Diagnosis not present

## 2014-08-25 DIAGNOSIS — I10 Essential (primary) hypertension: Secondary | ICD-10-CM | POA: Diagnosis not present

## 2014-08-25 NOTE — Telephone Encounter (Signed)
Left message with order to extend PT.

## 2014-08-27 DIAGNOSIS — R4182 Altered mental status, unspecified: Secondary | ICD-10-CM | POA: Diagnosis not present

## 2014-08-27 DIAGNOSIS — I63019 Cerebral infarction due to thrombosis of unspecified vertebral artery: Secondary | ICD-10-CM | POA: Diagnosis not present

## 2014-08-27 DIAGNOSIS — R627 Adult failure to thrive: Secondary | ICD-10-CM | POA: Diagnosis not present

## 2014-08-27 DIAGNOSIS — I10 Essential (primary) hypertension: Secondary | ICD-10-CM | POA: Diagnosis not present

## 2014-08-27 DIAGNOSIS — N179 Acute kidney failure, unspecified: Secondary | ICD-10-CM | POA: Diagnosis not present

## 2014-08-29 DIAGNOSIS — I10 Essential (primary) hypertension: Secondary | ICD-10-CM | POA: Diagnosis not present

## 2014-08-29 DIAGNOSIS — R627 Adult failure to thrive: Secondary | ICD-10-CM | POA: Diagnosis not present

## 2014-08-29 DIAGNOSIS — N179 Acute kidney failure, unspecified: Secondary | ICD-10-CM | POA: Diagnosis not present

## 2014-08-29 DIAGNOSIS — I63019 Cerebral infarction due to thrombosis of unspecified vertebral artery: Secondary | ICD-10-CM | POA: Diagnosis not present

## 2014-08-29 DIAGNOSIS — R4182 Altered mental status, unspecified: Secondary | ICD-10-CM | POA: Diagnosis not present

## 2014-08-30 DIAGNOSIS — I63019 Cerebral infarction due to thrombosis of unspecified vertebral artery: Secondary | ICD-10-CM | POA: Diagnosis not present

## 2014-08-30 DIAGNOSIS — N179 Acute kidney failure, unspecified: Secondary | ICD-10-CM | POA: Diagnosis not present

## 2014-08-30 DIAGNOSIS — I10 Essential (primary) hypertension: Secondary | ICD-10-CM | POA: Diagnosis not present

## 2014-08-30 DIAGNOSIS — R4182 Altered mental status, unspecified: Secondary | ICD-10-CM | POA: Diagnosis not present

## 2014-08-30 DIAGNOSIS — R627 Adult failure to thrive: Secondary | ICD-10-CM | POA: Diagnosis not present

## 2014-08-31 ENCOUNTER — Telehealth: Payer: Self-pay | Admitting: Emergency Medicine

## 2014-08-31 NOTE — Telephone Encounter (Signed)
Curtis Brock from [facility] called to check on an outstanding order for continuation of home health services.  Please call [phone]

## 2014-09-01 NOTE — Telephone Encounter (Signed)
Faxed this morning.

## 2014-09-05 ENCOUNTER — Telehealth: Payer: Self-pay

## 2014-09-05 NOTE — Telephone Encounter (Signed)
Per Gwinda Passe at [facility] Palliative care patients daughter has informed them patient health has declined drastically. She is requesting her dad to get Hospice care and Dr Everlene Farrier be the attending physician. Please contact Betsy at [phone].

## 2014-09-05 NOTE — Telephone Encounter (Signed)
Note printed and given to someone in waiting room

## 2014-09-05 NOTE — Telephone Encounter (Signed)
Please disregard previous message. Sorry.  Please read one from 8/22 put in by Ione.

## 2014-09-06 ENCOUNTER — Telehealth: Payer: Self-pay | Admitting: Family Medicine

## 2014-09-06 NOTE — Telephone Encounter (Signed)
I called Inez Catalina at [facility] to let them know that Dr Everlene Farrier will be Mr [place] Doctor

## 2014-09-06 NOTE — Telephone Encounter (Signed)
I will call number provided and see if hospice can be involved.

## 2014-10-18 ENCOUNTER — Encounter: Payer: Self-pay | Admitting: Emergency Medicine

## 2014-11-07 ENCOUNTER — Telehealth: Payer: Self-pay | Admitting: Emergency Medicine

## 2014-11-08 NOTE — Telephone Encounter (Signed)
Patient should have his B-12 levels checked again especially since it seems like he has not had this evaluated in a while. I will do this for one month to hold him over until Dr. Everlene Farrier gets back.

## 2014-11-08 NOTE — Telephone Encounter (Signed)
Mr Dispenza has a prescription refill for vitamin b-12 injection.  He was seen her 03/29/14, no mention of this.  Last time it was refilled was 04/05/13?

## 2014-11-11 NOTE — Telephone Encounter (Signed)
Notified pt's daughter of need for f/up and lab. She expressed concern that they are unable to get the pt upstairs and here to be seen due to his condition. She stated that they have a nurse coming to see him every week and have had PT in, but still not able to get around. Discussed possibility of referral to MD who can do house calls and she is open to the idea if Dr Everlene Farrier thinks it best, but would really like him to remain a pt of Dr Everlene Farrier until he retires. Dr Everlene Farrier, do you have any other ideas, since it seems that pt may not improve to the point that he can come in. Daughter did ask if he could see Dr Everlene Farrier same day in walk in if they can get him here, which I affirmed and advised we can help get pt into the office once they are here.

## 2014-11-16 NOTE — Telephone Encounter (Signed)
Curtis Brock from Hospice called and stated that daughter was upset that we asked them to come in.  She states that they can draw the labs there and send what we need to refill his medication.  I think they usually get 3 months thru mail order and ins pays but we only gave them 1 month and they had to pay for it.  She would like for Dr. Everlene Farrier to call her when he gets back at [phone] this is the number for Collie Siad at [facility]

## 2014-11-16 NOTE — Telephone Encounter (Signed)
Please let patient know that I am covering for Dr. Everlene Farrier. I do not know every case the way he does. I did provide a refill for the patient without requesting him to come back for it which is in line with our policy. I also suggested that the patient follow up with Dr. Everlene Farrier. Nowhere did I suggest the patient needed to switch doctors. I did not do anything wrong here and we are doing our best to help Dr. Everlene Farrier. Follow up with Dr. Everlene Farrier by phone if the patient can't come in. Thank you.

## 2014-11-18 NOTE — Telephone Encounter (Signed)
Spoke with daughter and gave message. She understood and seemed thankful.

## 2014-11-30 ENCOUNTER — Other Ambulatory Visit: Payer: Self-pay

## 2014-11-30 MED ORDER — CYANOCOBALAMIN 1000 MCG/ML IJ SOLN: INTRAMUSCULAR | Status: DC

## 2015-01-03 ENCOUNTER — Other Ambulatory Visit: Payer: Self-pay | Admitting: Emergency Medicine

## 2015-01-05 NOTE — Telephone Encounter (Signed)
Dr Everlene Farrier, after reading last message 11/07/14, I wanted to send this RF to you to see if you want to OK a 90 day RF to mail order. Pt hasn't been seen since March, but evidently is hospice pt and family has difficulty getting him in.

## 2015-01-05 NOTE — Telephone Encounter (Signed)
It appears he is in renal failure. I do not think he should stay on the losartan but we can refill his diuretic. He should not be on the losartan.

## 2015-01-06 NOTE — Telephone Encounter (Signed)
I also sent a message back to [facility] telling them to DC Rx, that pt should not be taking losartan.

## 2015-01-06 NOTE — Telephone Encounter (Signed)
LMOM for daughter Curtis Brock to [facility] about pt's meds. See Dr Perfecto Kingdom inst's below about pt stopping the losartan.

## 2015-01-10 ENCOUNTER — Telehealth: Payer: Self-pay

## 2015-01-10 NOTE — Telephone Encounter (Signed)
Dallas Schimke, RN at 01/10/2015 12:22 PM     Status: Signed       Expand All Collapse All   Had not heard back from daughter, so called again. Spoke to her and advised that Dr Everlene Farrier does not want pt taking the losartan any longer d/t renal failure and Nola voiced understanding and agreed to DC it.

## 2015-01-10 NOTE — Telephone Encounter (Signed)
Curtis Brock is returning a missed phone call. She states it's about a medication change. [phone]

## 2015-01-10 NOTE — Telephone Encounter (Signed)
Had not heard back from daughter, so called again. Spoke to her and advised that Dr Everlene Farrier does not want pt taking the losartan any longer d/t renal failure and Nola voiced understanding and agreed to DC it.

## 2015-02-02 ENCOUNTER — Telehealth: Payer: Self-pay | Admitting: Family Medicine

## 2015-02-02 NOTE — Telephone Encounter (Signed)
Okay to order cream.

## 2015-02-02 NOTE — Telephone Encounter (Signed)
Collie Siad, nurse with Hospice, called regarding pressure sores. Patient has 1 sore on his heel that is a stage 2, almost stage 3, and 3 different areas on sacrum. All 3 of those areas are all stage 3 as well. Other than these he is doing ok. She requested order for End-it cream. Verbal order given.

## 2015-02-09 ENCOUNTER — Telehealth: Payer: Self-pay | Admitting: *Deleted

## 2015-02-09 NOTE — Telephone Encounter (Signed)
Collie Siad from [facility] called regarding this patient. She wanted to know if it was okay to refill his Lasix and Aspirin medication?    After speaking with Dr. Everlene Farrier, he gave the okay.

## 2015-03-15 ENCOUNTER — Other Ambulatory Visit: Payer: Self-pay | Admitting: Emergency Medicine

## 2015-03-15 ENCOUNTER — Telehealth: Payer: Self-pay

## 2015-03-15 MED ORDER — FUROSEMIDE 20 MG PO TABS
ORAL_TABLET | ORAL | Status: DC
Start: 2015-03-15 — End: 2015-05-03

## 2015-03-15 NOTE — Telephone Encounter (Signed)
Call caregiver and tell her I would like to increase his Lasix to 2 tablets daily.

## 2015-03-15 NOTE — Telephone Encounter (Signed)
Becky Augusta from [facility]ospice is calling because patient needs a refill for lasix. He also has devolved a cough with crackles in right upper and lobe and has increased weakened. Please advise! [phone]

## 2015-03-16 NOTE — Telephone Encounter (Signed)
Called pt and advised message from provider on their voicemail.  

## 2015-04-08 ENCOUNTER — Other Ambulatory Visit: Payer: Self-pay | Admitting: Emergency Medicine

## 2015-04-14 ENCOUNTER — Other Ambulatory Visit: Payer: Self-pay | Admitting: Emergency Medicine

## 2015-05-03 ENCOUNTER — Other Ambulatory Visit: Payer: Self-pay | Admitting: Emergency Medicine

## 2015-05-17 ENCOUNTER — Telehealth: Payer: Self-pay

## 2015-05-17 NOTE — Telephone Encounter (Signed)
[facility] wanted dr Everlene Farrier to be aware

## 2015-06-30 ENCOUNTER — Telehealth: Payer: Self-pay

## 2015-06-30 NOTE — Telephone Encounter (Signed)
Dr. Margaree Mackintosh News!  Mr Curtis Brock is going home.  He is being discharged on Monday and will need you to order Equipment for the patient to have at home.  He will also, need you to continue prescribing medication beginning Tuesday.   Dr. Timmothy Sours [facility]

## 2015-07-03 NOTE — Telephone Encounter (Signed)
Please find out what orders Curtis Brock is going to need. Please be sure his family knows I will be retiring October 1. I will be happy to write for his prescription until I am retired.

## 2015-07-04 ENCOUNTER — Telehealth: Payer: Self-pay

## 2015-07-04 DIAGNOSIS — R269 Unspecified abnormalities of gait and mobility: Secondary | ICD-10-CM

## 2015-07-04 DIAGNOSIS — H547 Unspecified visual loss: Secondary | ICD-10-CM

## 2015-07-04 NOTE — Telephone Encounter (Signed)
Curtis Brock with hospice wanted to let dr Everlene Farrier know that patient has been discharged from hospice and needs to have an order for a hospital bed and bedside table and a APP mattress thru [facility]

## 2015-07-05 NOTE — Telephone Encounter (Signed)
Orders placed and melissa contacted at [facility].

## 2015-08-14 ENCOUNTER — Encounter: Payer: Self-pay | Admitting: Emergency Medicine

## 2015-08-14 ENCOUNTER — Ambulatory Visit (INDEPENDENT_AMBULATORY_CARE_PROVIDER_SITE_OTHER): Payer: Medicare Other | Admitting: Emergency Medicine

## 2015-08-14 ENCOUNTER — Telehealth: Payer: Self-pay

## 2015-08-14 VITALS — BP 102/64 | HR 98 | Temp 98.4°F | Resp 17

## 2015-08-14 DIAGNOSIS — N170 Acute kidney failure with tubular necrosis: Secondary | ICD-10-CM

## 2015-08-14 DIAGNOSIS — R739 Hyperglycemia, unspecified: Secondary | ICD-10-CM | POA: Diagnosis not present

## 2015-08-14 DIAGNOSIS — L2081 Atopic neurodermatitis: Secondary | ICD-10-CM | POA: Diagnosis not present

## 2015-08-14 DIAGNOSIS — H547 Unspecified visual loss: Secondary | ICD-10-CM

## 2015-08-14 DIAGNOSIS — H540 Blindness, both eyes: Secondary | ICD-10-CM

## 2015-08-14 DIAGNOSIS — R269 Unspecified abnormalities of gait and mobility: Secondary | ICD-10-CM

## 2015-08-14 LAB — BASIC METABOLIC PANEL WITH GFR
BUN: 20 mg/dL (ref 7–25)
CALCIUM: 9.3 mg/dL (ref 8.6–10.3)
CHLORIDE: 97 mmol/L — AB (ref 98–110)
CO2: 29 mmol/L (ref 20–31)
Creat: 0.97 mg/dL (ref 0.70–1.11)
GFR, EST NON AFRICAN AMERICAN: 69 mL/min (ref >=60)
GFR, Est African American: 80 mL/min (ref >=60)
Glucose, Bld: 138 mg/dL — ABNORMAL HIGH (ref 65–99)
Potassium: 4 mmol/L (ref 3.5–5.3)
SODIUM: 137 mmol/L (ref 135–146)

## 2015-08-14 LAB — POCT CBC
GRANULOCYTE PERCENT: 64.1 % (ref 37–80)
HEMATOCRIT: 36.6 % — AB (ref 43.5–53.7)
Hemoglobin: 12.5 g/dL — AB (ref 14.1–18.1)
Lymph, poc: 2.6 (ref 0.6–3.4)
MCH: 27.3 pg (ref 27–31.2)
MCHC: 34.1 g/dL (ref 31.8–35.4)
MCV: 80.2 fL (ref 80–97)
MID (cbc): 0.4 (ref 0–0.9)
MPV: 6.5 fL (ref 0–99.8)
PLATELET COUNT, POC: 321 10*3/uL (ref 142–424)
POC GRANULOCYTE: 5.3 (ref 2–6.9)
POC LYMPH %: 31.2 % (ref 10–50)
POC MID %: 4.7 %M (ref 0–12)
RBC: 4.56 M/uL — AB (ref 4.69–6.13)
RDW, POC: 13.5 %
WBC: 8.3 10*3/uL (ref 4.6–10.2)

## 2015-08-14 LAB — POCT GLYCOSYLATED HEMOGLOBIN (HGB A1C): HEMOGLOBIN A1C: 6.4

## 2015-08-14 LAB — GLUCOSE, POCT (MANUAL RESULT ENTRY): POC GLUCOSE: 140 mg/dL — AB (ref 70–99)

## 2015-08-14 LAB — CK: CK TOTAL: 77 U/L (ref 7–232)

## 2015-08-14 MED ORDER — BETAMETHASONE DIPROPIONATE AUG 0.05 % EX OINT: TOPICAL_OINTMENT | Freq: Two times a day (BID) | CUTANEOUS | 5 refills | Status: DC

## 2015-08-14 NOTE — Telephone Encounter (Signed)
Dr Everlene Farrier wrote orders for the patient for palliative care and physical therapy.  The referral, office notes, and copies of Dr Perfecto Kingdom written orders have been faxed to [facility].  Phone: [phone] Fax: [phone]

## 2015-08-14 NOTE — Patient Instructions (Signed)
     IF you received an x-ray today, you will receive an invoice from [facility]. Please contact [facility] at [phone] with questions or concerns regarding your invoice.   IF you received labwork today, you will receive an invoice from [facility]. Please contact [facility] at [phone] with questions or concerns regarding your invoice.   Our billing staff will not be able to assist you with questions regarding bills from these companies.  You will be contacted with the lab results as soon as they are available. The fastest way to get your results is to activate your My Chart account. Instructions are located on the last page of this paperwork. If you have not heard from us regarding the results in 2 weeks, please contact this office.      

## 2015-08-14 NOTE — Progress Notes (Signed)
Patient ID: Curtis Brock, male   DOB: 03/25/1925, 82 y.o.   MRN: [id number]    By signing my name below, I, Essence Howell, attest that this documentation has been prepared under the direction and in the presence of Darlyne Russian, MD Electronically Signed: Ladene Artist, ED Scribe 08/14/2015 at 12:11 PM.  Chief Complaint:  Chief Complaint  Patient presents with  . Other    rash   . Other    unable to stand   . Other    pain to touch    HPI: Curtis Brock is a 82 y.o. male, with a h/o DM and HTN, who reports to [facility] today with his son complaining of difficulty standing for the past 2 weeks. His son states that physical therapy stopped coming to his home a few weeks ago. He reports similar symptoms in the past that resolved with assistance of physical therapy. Pt reports associated symptoms of pain to his lower extremities with light touch. His family is concerned that he possibly experienced a mini stroke. Pt was last seen in the office in October 2016.   Rash Pt also presents with a rash to both shoulders first noticed 2 weeks ago. Pt's family has applied cortisone cream to the area without significant relief.   HTN Pt is not currently taking any medications for HTN. He is currently taking 1 20 mg lasix tablet daily. Triage BP: 102/64 which family states is average for him.   Past Medical History:  Diagnosis Date  . Blind left eye   . Diabetes mellitus without complication ([place])   . Hypertension    No past surgical history on file. Social History   Social History  . Marital status: Widowed    Spouse name: N/A  . Number of children: N/A  . Years of education: N/A   Social History Main Topics  . Smoking status: Never Smoker  . Smokeless tobacco: Never Used  . Alcohol use No  . Drug use: No  . Sexual activity: No   Other Topics Concern  . None   Social History Narrative  . None   No family history on file. No Known Allergies Prior to Admission medications   Medication  Sig Start Date End Date Taking? Authorizing Provider  acetaminophen (TYLENOL) 325 MG tablet Take 2 tablets (650 mg total) by mouth every 6 (six) hours as needed for mild pain or moderate pain (or Fever >/= 101). 04/20/13  Yes Sheila Oats, MD  aspirin EC 81 MG tablet Take 81 mg by mouth daily.   Yes Historical Provider, MD  brimonidine (ALPHAGAN) 0.2 % ophthalmic solution Place 1 drop into the right eye 2 (two) times daily. 03/28/14  Yes Historical Provider, MD  cyanocobalamin (,VITAMIN B-12,) 1000 MCG/ML injection INJECT 1 ML AS DIRECTED EVERY MONTH 04/11/15  Yes Darlyne Russian, MD  furosemide (LASIX) 20 MG tablet Take 2 tablets by mouth  daily 05/03/15  Yes Darlyne Russian, MD  senna-docusate (SENOKOT-S) 8.6-50 MG per tablet Take 1 tablet by mouth at bedtime as needed for mild constipation. 05/24/14  Yes Domenic Polite, MD   ROS: The patient denies fevers, chills, night sweats, unintentional weight loss, chest pain, palpitations, wheezing, dyspnea on exertion, nausea, vomiting, abdominal pain, dysuria, hematuria, melena, numbness or tingling. +rash  All other systems have been reviewed and were otherwise negative with the exception of those mentioned in the HPI and as above.    PHYSICAL EXAM: Vitals:   08/14/15 1120  BP: 102/64  Pulse: 98  Resp: 17  Temp: 98.4 F (36.9 C)   There is no height or weight on file to calculate BMI.  General: Alert, no acute distress HEENT:  Normocephalic, atraumatic, oropharynx patent.  Eye: Essentially bilateral lost of vision but can see my face close up. Cardiovascular:  Regular rate and rhythm, no rubs murmurs or gallops. No Carotid bruits, radial pulse intact. No pedal edema.  Respiratory: Clear to auscultation bilaterally.  No wheezes, rales, or rhonchi. No cyanosis, no use of accessory musculature Abdominal: No organomegaly, abdomen is soft and non-tender, positive bowel sounds. No masses. Musculoskeletal: Maximal assistance needed from 2 people to get  to standing position. Able to plantar flex both feet and raise both knees from seated position. Trace edema noted but normal cap refill.  Skin: Excoriated macular papular rash to anterior both shoulders.  Neurologic: Facial musculature symmetric. Psychiatric: Patient acts appropriately throughout our interaction. Lymphatic: No cervical or submandibular lymphadenopathy  LABS: Results for orders placed or performed in visit on 08/14/15  POCT CBC  Result Value Ref Range   WBC 8.3 4.6 - 10.2 K/uL   Lymph, poc 2.6 0.6 - 3.4   POC LYMPH PERCENT 31.2 10 - 50 %L   MID (cbc) 0.4 0 - 0.9   POC MID % 4.7 0 - 12 %M   POC Granulocyte 5.3 2 - 6.9   Granulocyte percent 64.1 37 - 80 %G   RBC 4.56 (A) 4.69 - 6.13 M/uL   Hemoglobin 12.5 (A) 14.1 - 18.1 g/dL   HCT, POC 36.6 (A) 43.5 - 53.7 %   MCV 80.2 80 - 97 fL   MCH, POC 27.3 27 - 31.2 pg   MCHC 34.1 31.8 - 35.4 g/dL   RDW, POC 13.5 %   Platelet Count, POC 321 142 - 424 K/uL   MPV 6.5 0 - 99.8 fL  POCT glucose (manual entry)  Result Value Ref Range   POC Glucose 140 (A) 70 - 99 mg/dl  POCT glycosylated hemoglobin (Hb A1C)  Result Value Ref Range   Hemoglobin A1C 6.4    EKG/XRAY:   Primary read interpreted by Dr. Everlene Farrier at [facility].  ASSESSMENT/PLAN: Patient may have had a brain DM mini stroke. He is unable to get up from a seated to a standing position without the assistance of 2 people. He is essentially blind. We'll go ahead and try and arrange palliative care for the home. We'll also make referral for physical therapy to help with his gait. Sugar is under good control with an A1c of 6.4.   Gross sideeffects, risk and benefits, and alternatives of medications d/w patient. Patient is aware that all medications have potential sideeffects and we are unable to predict every sideeffect or drug-drug interaction that may occur.  Arlyss Queen MD 08/14/2015 11:25 AM

## 2015-08-14 NOTE — Addendum Note (Signed)
Addended by: Arlyss Queen A on: 08/14/2015 02:48 PM   Modules accepted: Orders

## 2015-08-16 ENCOUNTER — Telehealth: Payer: Self-pay

## 2015-08-16 NOTE — Telephone Encounter (Signed)
Received a call from Antelope at [facility]).  She received the referral and will begin the process of setting the patient up for care through their facility.  [facility]: [phone]

## 2015-08-17 ENCOUNTER — Telehealth: Payer: Self-pay

## 2015-08-17 NOTE — Telephone Encounter (Signed)
THIS CALL IS FROM RENEE AT [facility].  SHE WOULD LIKE DR. DAUB TO KNOW THAT SHE WILL NOT START [facility] OCCUPATIONAL THERAPY EVALUATION UNTIL Monday 08/21/2015 PER HIS DAUGHTER'S REQUEST. HER NAME IS NOLA LEWIS.   IF DR. DAUB SHOULD HAVE ANY QUESTIONS PLEASE CALL [phone]. PLEASE ASK FOR RENEE.  [place]

## 2015-08-22 ENCOUNTER — Telehealth: Payer: Self-pay

## 2015-08-22 NOTE — Telephone Encounter (Signed)
Steele Berg is RN wanting orders for occupational therapy evaluation as soon as possible. Message from 08/21/15 is incorrect-has renne titled wrong. Please advise Steele Berg [phone]

## 2015-08-23 NOTE — Telephone Encounter (Signed)
Renne advised.

## 2015-08-23 NOTE — Telephone Encounter (Signed)
Yes please schedule occupational therapy.

## 2015-08-25 ENCOUNTER — Telehealth: Payer: Self-pay

## 2015-08-25 NOTE — Telephone Encounter (Signed)
Curtis Brock from [facility] is calling to let dr know that the daughter Curtis not be able to schedule time for patient to see him til next week and Curtis Brock is needing an order that it is ok for next week  [phone]

## 2015-08-26 NOTE — Telephone Encounter (Signed)
Clay send a note that it is okay to start therapy next week.

## 2015-08-28 NOTE — Telephone Encounter (Signed)
The number below is not in service.

## 2015-09-07 ENCOUNTER — Telehealth: Payer: Self-pay

## 2015-09-07 DIAGNOSIS — N170 Acute kidney failure with tubular necrosis: Secondary | ICD-10-CM

## 2015-09-07 DIAGNOSIS — R269 Unspecified abnormalities of gait and mobility: Secondary | ICD-10-CM

## 2015-09-07 NOTE — Telephone Encounter (Signed)
Curtis Brock from [facility]) came by to let us know that they do not accept the patient's insurance, only Medicare.  I am not sure why they waited until today to state that they were not in network, as I spoke with a representative a few weeks ago to confirm that everything for the referral was done appropriately and it was not mentioned.  They also have been providing treatment to him over the last few weeks, despite not accepting his insurance.  Originally, Dr Everlene Farrier wrote the orders for nursing assessment for palliative care and physical therapy on an Rx pad.  We no longer have those orders, as they were shredded after we received confirmation that the referral was being processed.  If new orders are needed, please place into EPIC.  Hopefully we can find another facility that is in network with his insurance - unless the patient is ok with out of network care.

## 2015-09-08 NOTE — Telephone Encounter (Signed)
Do I need to write on a prescription further orders ??

## 2015-09-12 ENCOUNTER — Telehealth: Payer: Self-pay

## 2015-09-12 NOTE — Telephone Encounter (Signed)
Please call and talk to the family. They were involved with contacting palliative care to get  help at home. If there are any forms I need to complete before Curtis Brock please get them to me before I'm gone.

## 2015-09-12 NOTE — Telephone Encounter (Signed)
Daughter Cherene Julian stated that she just found out today that [facility] was not accepting pt's ins. Melanie from Referrals advised me while I was on the phone w/daughter (and I relayed this info) that [facility] is trying to find another [facility] agency to transfer pt's care to who will accept ins. In the meantime, daughter stated that she had given the info about palliative care to Dr Everlene Farrier at [facility] and she has not heard anything from palliative care since. Spoke to [place] who stated that [facility] had advised her that they could eval pt to see if he would qualify for palliative care when they went out to start pt's care, but Threasa Beards has not heard anything back from them about this. I went ahead and called [facility] to ask if they provide just Palliative care if pt does not qualify for [facility]osp[facility] and was told that they do have a separated division. Advised to send an order along w/OV notes and they can go to eval pt for palliative care. Spoke to [place] and put in order. Dr Everlene Farrier, Juluis Rainier

## 2015-09-12 NOTE — Telephone Encounter (Signed)
Curtis Brock Nurse needs order sent by Daub for skilled nursing and pt.  Previous company that was doing this, just realized they were not contracted with his insurance. Nurse is Deb [phone] ext. [phone].  Fax number is [phone].  Will go see him Thursday.  Need asap

## 2015-09-12 NOTE — Telephone Encounter (Signed)
I received a faxed order from [facility] today and saw this message. Talked to [facility] and it seems that we should contact the pt/family to see if they are aware that [facility] is not in network and if they want to continue with them. Dr Everlene Farrier, we do not have a recent HIPPA filled out for pt. Do you know if the daughter is POA for pt or if we should try to speak to pt himself? Threasa Beards stated that new orders should be put into EPIC. I started some orders but there are a number of questions assoc w/the orders that you will need to answer, Dr Everlene Farrier. If you click on link for sig under order, you will be able to answer the questions and complete the order.

## 2015-09-12 NOTE — Telephone Encounter (Signed)
The referral along with the OV notes and most recent labs has been sent to [facility] for [facility] care evaluation and treatment. Sent to fax #: [phone]

## 2015-09-13 ENCOUNTER — Other Ambulatory Visit: Payer: Self-pay | Admitting: Emergency Medicine

## 2015-09-13 NOTE — Telephone Encounter (Signed)
Ok, order pended.

## 2015-09-13 NOTE — Telephone Encounter (Signed)
Curtis Brock is calling from [facility] because someone came in to pick up an order but it was for hospice. [name] does not have hospice service. Please advsie!  [phone] [phone]

## 2015-09-13 NOTE — Telephone Encounter (Signed)
I could not find how to sign the order.

## 2015-09-19 ENCOUNTER — Telehealth: Payer: Self-pay

## 2015-09-19 NOTE — Telephone Encounter (Signed)
Patient's daughter called requesting a prescription for a handicap parking pass and an in-home chair lift for the stairs. Please advise.  [phone]

## 2015-09-26 DIAGNOSIS — R531 Weakness: Secondary | ICD-10-CM | POA: Diagnosis not present

## 2015-09-29 NOTE — Telephone Encounter (Signed)
This patient is essentially blind. He has end-stage diabetes and needs maximal assistance to get from a seated to a standing position. He needs at least 2 people to help him from a chair. A lift chair would be essential to allow him to get to a standing position to where he could start to walk and do his physical therapy I certainly can write a prescription for a chair lift because patient is not physically able to go up and down stairs by himself.

## 2015-09-29 NOTE — Telephone Encounter (Signed)
This wasn't routed properly when message first came in, but daughter is calling asking about this today. I had Stanton Kidney advise her of this and that I will make sure that Dr Everlene Farrier sees the message before he leaves today. Dr Everlene Farrier, I don't know how to order a chair lift, daughter may have to find out what DME companies her approves but I'm sure it will require notes specifically stating pt needs this. You may be able to addend the OV notes from 7/31 OV since you have Dx of gait disturbance already in notes. I am having form for handicapped placard filled out for pt and given to you to sign.

## 2015-09-29 NOTE — Telephone Encounter (Signed)
Called daughter and advised that Dr Everlene Farrier has written a Rx for chair lift and has documented need for one. Also have handicapped placard application ready. She stated she will p/up tomorrow.

## 2015-10-02 ENCOUNTER — Telehealth: Payer: Self-pay

## 2015-10-02 DIAGNOSIS — R531 Weakness: Secondary | ICD-10-CM

## 2015-10-02 DIAGNOSIS — G629 Polyneuropathy, unspecified: Secondary | ICD-10-CM

## 2015-10-02 DIAGNOSIS — R269 Unspecified abnormalities of gait and mobility: Secondary | ICD-10-CM

## 2015-10-02 DIAGNOSIS — I639 Cerebral infarction, unspecified: Secondary | ICD-10-CM

## 2015-10-02 NOTE — Telephone Encounter (Signed)
Called Josh back and he reported that they do not provide PT there. Suggested that I put in another order to [facility] for PT. Done. Dr Everlene Farrier, Juluis Rainier

## 2015-10-02 NOTE — Telephone Encounter (Signed)
Thank you!  Curtis Brock

## 2015-10-02 NOTE — Telephone Encounter (Signed)
Dr Everlene Farrier received visit notes from Altha Harm, NP with [facility] advising that daughter is requesting PT for pt. Per Dr Perfecto Kingdom request I called and LMOM giving VO for PT, OT or any other therapy that they think could benefit pt. Asked for CB with any questions. Dr Everlene Farrier, Juluis Rainier

## 2015-10-02 NOTE — Telephone Encounter (Signed)
Merrily [employer] is returning a missed phone call for [employer]

## 2015-10-05 ENCOUNTER — Telehealth: Payer: Self-pay

## 2015-10-05 DIAGNOSIS — Z8673 Personal history of transient ischemic attack (TIA), and cerebral infarction without residual deficits: Secondary | ICD-10-CM | POA: Diagnosis not present

## 2015-10-05 DIAGNOSIS — M6281 Muscle weakness (generalized): Secondary | ICD-10-CM | POA: Diagnosis not present

## 2015-10-05 DIAGNOSIS — E1142 Type 2 diabetes mellitus with diabetic polyneuropathy: Secondary | ICD-10-CM | POA: Diagnosis not present

## 2015-10-05 DIAGNOSIS — H5412 Blindness, left eye, low vision right eye: Secondary | ICD-10-CM | POA: Diagnosis not present

## 2015-10-05 DIAGNOSIS — Z7982 Long term (current) use of aspirin: Secondary | ICD-10-CM | POA: Diagnosis not present

## 2015-10-05 DIAGNOSIS — R2689 Other abnormalities of gait and mobility: Secondary | ICD-10-CM | POA: Diagnosis not present

## 2015-10-05 DIAGNOSIS — I10 Essential (primary) hypertension: Secondary | ICD-10-CM | POA: Diagnosis not present

## 2015-10-05 NOTE — Telephone Encounter (Signed)
Danielle from [place] at [facility] is calling to request verbal orders for home health physical therapy for one week once a week, and also for eight weeks twice a week. Please call! [phone]

## 2015-10-09 NOTE — Telephone Encounter (Signed)
Please send in request for physical therapy. Please be sure patient knows this is my last week at work. He will need to follow-up with another provider here at urgent care.

## 2015-10-10 DIAGNOSIS — Z8673 Personal history of transient ischemic attack (TIA), and cerebral infarction without residual deficits: Secondary | ICD-10-CM | POA: Diagnosis not present

## 2015-10-10 DIAGNOSIS — I10 Essential (primary) hypertension: Secondary | ICD-10-CM | POA: Diagnosis not present

## 2015-10-10 DIAGNOSIS — E1142 Type 2 diabetes mellitus with diabetic polyneuropathy: Secondary | ICD-10-CM | POA: Diagnosis not present

## 2015-10-10 DIAGNOSIS — Z7982 Long term (current) use of aspirin: Secondary | ICD-10-CM | POA: Diagnosis not present

## 2015-10-10 DIAGNOSIS — R2689 Other abnormalities of gait and mobility: Secondary | ICD-10-CM | POA: Diagnosis not present

## 2015-10-10 DIAGNOSIS — H5412 Blindness, left eye, low vision right eye: Secondary | ICD-10-CM | POA: Diagnosis not present

## 2015-10-10 DIAGNOSIS — M6281 Muscle weakness (generalized): Secondary | ICD-10-CM | POA: Diagnosis not present

## 2015-10-13 DIAGNOSIS — Z7982 Long term (current) use of aspirin: Secondary | ICD-10-CM | POA: Diagnosis not present

## 2015-10-13 DIAGNOSIS — H5412 Blindness, left eye, low vision right eye: Secondary | ICD-10-CM | POA: Diagnosis not present

## 2015-10-13 DIAGNOSIS — I10 Essential (primary) hypertension: Secondary | ICD-10-CM | POA: Diagnosis not present

## 2015-10-13 DIAGNOSIS — R2689 Other abnormalities of gait and mobility: Secondary | ICD-10-CM | POA: Diagnosis not present

## 2015-10-13 DIAGNOSIS — Z8673 Personal history of transient ischemic attack (TIA), and cerebral infarction without residual deficits: Secondary | ICD-10-CM | POA: Diagnosis not present

## 2015-10-13 DIAGNOSIS — E1142 Type 2 diabetes mellitus with diabetic polyneuropathy: Secondary | ICD-10-CM | POA: Diagnosis not present

## 2015-10-13 DIAGNOSIS — M6281 Muscle weakness (generalized): Secondary | ICD-10-CM | POA: Diagnosis not present

## 2015-10-16 DIAGNOSIS — I10 Essential (primary) hypertension: Secondary | ICD-10-CM | POA: Diagnosis not present

## 2015-10-16 DIAGNOSIS — H543 Unqualified visual loss, both eyes: Secondary | ICD-10-CM | POA: Diagnosis not present

## 2015-10-16 DIAGNOSIS — Z7982 Long term (current) use of aspirin: Secondary | ICD-10-CM | POA: Diagnosis not present

## 2015-10-16 DIAGNOSIS — M6281 Muscle weakness (generalized): Secondary | ICD-10-CM | POA: Diagnosis not present

## 2015-10-16 DIAGNOSIS — R2689 Other abnormalities of gait and mobility: Secondary | ICD-10-CM | POA: Diagnosis not present

## 2015-10-16 DIAGNOSIS — Z8673 Personal history of transient ischemic attack (TIA), and cerebral infarction without residual deficits: Secondary | ICD-10-CM | POA: Diagnosis not present

## 2015-10-16 DIAGNOSIS — E1142 Type 2 diabetes mellitus with diabetic polyneuropathy: Secondary | ICD-10-CM | POA: Diagnosis not present

## 2015-10-18 DIAGNOSIS — R2689 Other abnormalities of gait and mobility: Secondary | ICD-10-CM | POA: Diagnosis not present

## 2015-10-18 DIAGNOSIS — Z8673 Personal history of transient ischemic attack (TIA), and cerebral infarction without residual deficits: Secondary | ICD-10-CM | POA: Diagnosis not present

## 2015-10-18 DIAGNOSIS — I10 Essential (primary) hypertension: Secondary | ICD-10-CM | POA: Diagnosis not present

## 2015-10-18 DIAGNOSIS — M6281 Muscle weakness (generalized): Secondary | ICD-10-CM | POA: Diagnosis not present

## 2015-10-18 DIAGNOSIS — H543 Unqualified visual loss, both eyes: Secondary | ICD-10-CM | POA: Diagnosis not present

## 2015-10-18 DIAGNOSIS — Z7982 Long term (current) use of aspirin: Secondary | ICD-10-CM | POA: Diagnosis not present

## 2015-10-18 DIAGNOSIS — E1142 Type 2 diabetes mellitus with diabetic polyneuropathy: Secondary | ICD-10-CM | POA: Diagnosis not present

## 2015-10-25 DIAGNOSIS — E1142 Type 2 diabetes mellitus with diabetic polyneuropathy: Secondary | ICD-10-CM | POA: Diagnosis not present

## 2015-10-25 DIAGNOSIS — Z8673 Personal history of transient ischemic attack (TIA), and cerebral infarction without residual deficits: Secondary | ICD-10-CM | POA: Diagnosis not present

## 2015-10-25 DIAGNOSIS — R2689 Other abnormalities of gait and mobility: Secondary | ICD-10-CM | POA: Diagnosis not present

## 2015-10-25 DIAGNOSIS — M6281 Muscle weakness (generalized): Secondary | ICD-10-CM | POA: Diagnosis not present

## 2015-10-25 DIAGNOSIS — I10 Essential (primary) hypertension: Secondary | ICD-10-CM | POA: Diagnosis not present

## 2015-10-25 DIAGNOSIS — Z7982 Long term (current) use of aspirin: Secondary | ICD-10-CM | POA: Diagnosis not present

## 2015-10-25 DIAGNOSIS — H543 Unqualified visual loss, both eyes: Secondary | ICD-10-CM | POA: Diagnosis not present

## 2015-10-26 DIAGNOSIS — E1142 Type 2 diabetes mellitus with diabetic polyneuropathy: Secondary | ICD-10-CM | POA: Diagnosis not present

## 2015-10-26 DIAGNOSIS — Z7982 Long term (current) use of aspirin: Secondary | ICD-10-CM | POA: Diagnosis not present

## 2015-10-26 DIAGNOSIS — R2689 Other abnormalities of gait and mobility: Secondary | ICD-10-CM | POA: Diagnosis not present

## 2015-10-26 DIAGNOSIS — I10 Essential (primary) hypertension: Secondary | ICD-10-CM | POA: Diagnosis not present

## 2015-10-26 DIAGNOSIS — Z8673 Personal history of transient ischemic attack (TIA), and cerebral infarction without residual deficits: Secondary | ICD-10-CM | POA: Diagnosis not present

## 2015-10-26 DIAGNOSIS — H543 Unqualified visual loss, both eyes: Secondary | ICD-10-CM | POA: Diagnosis not present

## 2015-10-26 DIAGNOSIS — M6281 Muscle weakness (generalized): Secondary | ICD-10-CM | POA: Diagnosis not present

## 2015-11-01 DIAGNOSIS — Z7982 Long term (current) use of aspirin: Secondary | ICD-10-CM | POA: Diagnosis not present

## 2015-11-01 DIAGNOSIS — E1142 Type 2 diabetes mellitus with diabetic polyneuropathy: Secondary | ICD-10-CM | POA: Diagnosis not present

## 2015-11-01 DIAGNOSIS — H543 Unqualified visual loss, both eyes: Secondary | ICD-10-CM | POA: Diagnosis not present

## 2015-11-01 DIAGNOSIS — M6281 Muscle weakness (generalized): Secondary | ICD-10-CM | POA: Diagnosis not present

## 2015-11-01 DIAGNOSIS — R2689 Other abnormalities of gait and mobility: Secondary | ICD-10-CM | POA: Diagnosis not present

## 2015-11-01 DIAGNOSIS — Z8673 Personal history of transient ischemic attack (TIA), and cerebral infarction without residual deficits: Secondary | ICD-10-CM | POA: Diagnosis not present

## 2015-11-01 DIAGNOSIS — I10 Essential (primary) hypertension: Secondary | ICD-10-CM | POA: Diagnosis not present

## 2015-11-03 DIAGNOSIS — M6281 Muscle weakness (generalized): Secondary | ICD-10-CM | POA: Diagnosis not present

## 2015-11-03 DIAGNOSIS — I10 Essential (primary) hypertension: Secondary | ICD-10-CM | POA: Diagnosis not present

## 2015-11-03 DIAGNOSIS — Z7982 Long term (current) use of aspirin: Secondary | ICD-10-CM | POA: Diagnosis not present

## 2015-11-03 DIAGNOSIS — H543 Unqualified visual loss, both eyes: Secondary | ICD-10-CM | POA: Diagnosis not present

## 2015-11-03 DIAGNOSIS — Z8673 Personal history of transient ischemic attack (TIA), and cerebral infarction without residual deficits: Secondary | ICD-10-CM | POA: Diagnosis not present

## 2015-11-03 DIAGNOSIS — R2689 Other abnormalities of gait and mobility: Secondary | ICD-10-CM | POA: Diagnosis not present

## 2015-11-03 DIAGNOSIS — E1142 Type 2 diabetes mellitus with diabetic polyneuropathy: Secondary | ICD-10-CM | POA: Diagnosis not present

## 2015-11-06 DIAGNOSIS — E1165 Type 2 diabetes mellitus with hyperglycemia: Secondary | ICD-10-CM | POA: Diagnosis not present

## 2015-11-06 DIAGNOSIS — H1712 Central corneal opacity, left eye: Secondary | ICD-10-CM | POA: Diagnosis not present

## 2015-11-06 DIAGNOSIS — I10 Essential (primary) hypertension: Secondary | ICD-10-CM | POA: Diagnosis not present

## 2015-11-06 DIAGNOSIS — H1812 Bullous keratopathy, left eye: Secondary | ICD-10-CM | POA: Diagnosis not present

## 2015-11-06 DIAGNOSIS — H02831 Dermatochalasis of right upper eyelid: Secondary | ICD-10-CM | POA: Diagnosis not present

## 2015-11-06 DIAGNOSIS — H2703 Aphakia, bilateral: Secondary | ICD-10-CM | POA: Diagnosis not present

## 2015-11-06 DIAGNOSIS — Z8639 Personal history of other endocrine, nutritional and metabolic disease: Secondary | ICD-10-CM | POA: Diagnosis not present

## 2015-11-06 DIAGNOSIS — E784 Other hyperlipidemia: Secondary | ICD-10-CM | POA: Diagnosis not present

## 2015-11-06 DIAGNOSIS — E039 Hypothyroidism, unspecified: Secondary | ICD-10-CM | POA: Diagnosis not present

## 2015-11-06 DIAGNOSIS — H40013 Open angle with borderline findings, low risk, bilateral: Secondary | ICD-10-CM | POA: Diagnosis not present

## 2015-11-07 ENCOUNTER — Telehealth: Payer: Self-pay

## 2015-11-07 DIAGNOSIS — M6281 Muscle weakness (generalized): Secondary | ICD-10-CM | POA: Diagnosis not present

## 2015-11-07 DIAGNOSIS — R2689 Other abnormalities of gait and mobility: Secondary | ICD-10-CM | POA: Diagnosis not present

## 2015-11-07 DIAGNOSIS — Z8673 Personal history of transient ischemic attack (TIA), and cerebral infarction without residual deficits: Secondary | ICD-10-CM | POA: Diagnosis not present

## 2015-11-07 DIAGNOSIS — I10 Essential (primary) hypertension: Secondary | ICD-10-CM | POA: Diagnosis not present

## 2015-11-07 DIAGNOSIS — H543 Unqualified visual loss, both eyes: Secondary | ICD-10-CM | POA: Diagnosis not present

## 2015-11-07 DIAGNOSIS — E1142 Type 2 diabetes mellitus with diabetic polyneuropathy: Secondary | ICD-10-CM | POA: Diagnosis not present

## 2015-11-07 DIAGNOSIS — Z7982 Long term (current) use of aspirin: Secondary | ICD-10-CM | POA: Diagnosis not present

## 2015-11-07 NOTE — Telephone Encounter (Signed)
error 

## 2015-11-08 DIAGNOSIS — M6281 Muscle weakness (generalized): Secondary | ICD-10-CM | POA: Diagnosis not present

## 2015-11-08 DIAGNOSIS — I10 Essential (primary) hypertension: Secondary | ICD-10-CM | POA: Diagnosis not present

## 2015-11-08 DIAGNOSIS — Z7982 Long term (current) use of aspirin: Secondary | ICD-10-CM | POA: Diagnosis not present

## 2015-11-08 DIAGNOSIS — R2689 Other abnormalities of gait and mobility: Secondary | ICD-10-CM | POA: Diagnosis not present

## 2015-11-08 DIAGNOSIS — H543 Unqualified visual loss, both eyes: Secondary | ICD-10-CM | POA: Diagnosis not present

## 2015-11-08 DIAGNOSIS — Z8673 Personal history of transient ischemic attack (TIA), and cerebral infarction without residual deficits: Secondary | ICD-10-CM | POA: Diagnosis not present

## 2015-11-08 DIAGNOSIS — E1142 Type 2 diabetes mellitus with diabetic polyneuropathy: Secondary | ICD-10-CM | POA: Diagnosis not present

## 2015-11-15 DIAGNOSIS — D638 Anemia in other chronic diseases classified elsewhere: Secondary | ICD-10-CM | POA: Diagnosis not present

## 2015-11-15 DIAGNOSIS — I10 Essential (primary) hypertension: Secondary | ICD-10-CM | POA: Diagnosis not present

## 2015-11-15 DIAGNOSIS — M545 Low back pain: Secondary | ICD-10-CM | POA: Diagnosis not present

## 2015-11-15 DIAGNOSIS — Z7982 Long term (current) use of aspirin: Secondary | ICD-10-CM | POA: Diagnosis not present

## 2015-11-15 DIAGNOSIS — E1142 Type 2 diabetes mellitus with diabetic polyneuropathy: Secondary | ICD-10-CM | POA: Diagnosis not present

## 2015-11-15 DIAGNOSIS — E785 Hyperlipidemia, unspecified: Secondary | ICD-10-CM | POA: Diagnosis not present

## 2015-11-15 DIAGNOSIS — I272 Pulmonary hypertension, unspecified: Secondary | ICD-10-CM | POA: Diagnosis not present

## 2015-11-15 DIAGNOSIS — E1159 Type 2 diabetes mellitus with other circulatory complications: Secondary | ICD-10-CM | POA: Diagnosis not present

## 2015-11-15 DIAGNOSIS — Z9181 History of falling: Secondary | ICD-10-CM | POA: Diagnosis not present

## 2015-11-15 DIAGNOSIS — Z8673 Personal history of transient ischemic attack (TIA), and cerebral infarction without residual deficits: Secondary | ICD-10-CM | POA: Diagnosis not present

## 2015-11-15 DIAGNOSIS — H5462 Unqualified visual loss, left eye, normal vision right eye: Secondary | ICD-10-CM | POA: Diagnosis not present

## 2015-11-15 DIAGNOSIS — M6281 Muscle weakness (generalized): Secondary | ICD-10-CM | POA: Diagnosis not present

## 2015-11-17 DIAGNOSIS — I272 Pulmonary hypertension, unspecified: Secondary | ICD-10-CM | POA: Diagnosis not present

## 2015-11-17 DIAGNOSIS — Z9181 History of falling: Secondary | ICD-10-CM | POA: Diagnosis not present

## 2015-11-17 DIAGNOSIS — Z8673 Personal history of transient ischemic attack (TIA), and cerebral infarction without residual deficits: Secondary | ICD-10-CM | POA: Diagnosis not present

## 2015-11-17 DIAGNOSIS — H5462 Unqualified visual loss, left eye, normal vision right eye: Secondary | ICD-10-CM | POA: Diagnosis not present

## 2015-11-17 DIAGNOSIS — I10 Essential (primary) hypertension: Secondary | ICD-10-CM | POA: Diagnosis not present

## 2015-11-17 DIAGNOSIS — E785 Hyperlipidemia, unspecified: Secondary | ICD-10-CM | POA: Diagnosis not present

## 2015-11-17 DIAGNOSIS — E1159 Type 2 diabetes mellitus with other circulatory complications: Secondary | ICD-10-CM | POA: Diagnosis not present

## 2015-11-17 DIAGNOSIS — M545 Low back pain: Secondary | ICD-10-CM | POA: Diagnosis not present

## 2015-11-17 DIAGNOSIS — Z7982 Long term (current) use of aspirin: Secondary | ICD-10-CM | POA: Diagnosis not present

## 2015-11-17 DIAGNOSIS — E1142 Type 2 diabetes mellitus with diabetic polyneuropathy: Secondary | ICD-10-CM | POA: Diagnosis not present

## 2015-11-17 DIAGNOSIS — D638 Anemia in other chronic diseases classified elsewhere: Secondary | ICD-10-CM | POA: Diagnosis not present

## 2015-11-17 DIAGNOSIS — M6281 Muscle weakness (generalized): Secondary | ICD-10-CM | POA: Diagnosis not present

## 2015-11-22 DIAGNOSIS — Z8673 Personal history of transient ischemic attack (TIA), and cerebral infarction without residual deficits: Secondary | ICD-10-CM | POA: Diagnosis not present

## 2015-11-22 DIAGNOSIS — E1142 Type 2 diabetes mellitus with diabetic polyneuropathy: Secondary | ICD-10-CM | POA: Diagnosis not present

## 2015-11-22 DIAGNOSIS — Z7982 Long term (current) use of aspirin: Secondary | ICD-10-CM | POA: Diagnosis not present

## 2015-11-22 DIAGNOSIS — M6281 Muscle weakness (generalized): Secondary | ICD-10-CM | POA: Diagnosis not present

## 2015-11-22 DIAGNOSIS — H543 Unqualified visual loss, both eyes: Secondary | ICD-10-CM | POA: Diagnosis not present

## 2015-11-22 DIAGNOSIS — I10 Essential (primary) hypertension: Secondary | ICD-10-CM | POA: Diagnosis not present

## 2015-11-22 DIAGNOSIS — R2689 Other abnormalities of gait and mobility: Secondary | ICD-10-CM | POA: Diagnosis not present

## 2015-11-28 DIAGNOSIS — Z8673 Personal history of transient ischemic attack (TIA), and cerebral infarction without residual deficits: Secondary | ICD-10-CM | POA: Diagnosis not present

## 2015-11-28 DIAGNOSIS — I10 Essential (primary) hypertension: Secondary | ICD-10-CM | POA: Diagnosis not present

## 2015-11-28 DIAGNOSIS — H543 Unqualified visual loss, both eyes: Secondary | ICD-10-CM | POA: Diagnosis not present

## 2015-11-28 DIAGNOSIS — M545 Low back pain: Secondary | ICD-10-CM | POA: Diagnosis not present

## 2015-11-28 DIAGNOSIS — E1159 Type 2 diabetes mellitus with other circulatory complications: Secondary | ICD-10-CM | POA: Diagnosis not present

## 2015-11-28 DIAGNOSIS — Z7982 Long term (current) use of aspirin: Secondary | ICD-10-CM | POA: Diagnosis not present

## 2015-11-28 DIAGNOSIS — H5462 Unqualified visual loss, left eye, normal vision right eye: Secondary | ICD-10-CM | POA: Diagnosis not present

## 2015-11-28 DIAGNOSIS — Z9181 History of falling: Secondary | ICD-10-CM | POA: Diagnosis not present

## 2015-11-28 DIAGNOSIS — R2689 Other abnormalities of gait and mobility: Secondary | ICD-10-CM | POA: Diagnosis not present

## 2015-11-28 DIAGNOSIS — E1142 Type 2 diabetes mellitus with diabetic polyneuropathy: Secondary | ICD-10-CM | POA: Diagnosis not present

## 2015-11-28 DIAGNOSIS — E785 Hyperlipidemia, unspecified: Secondary | ICD-10-CM | POA: Diagnosis not present

## 2015-11-28 DIAGNOSIS — I272 Pulmonary hypertension, unspecified: Secondary | ICD-10-CM | POA: Diagnosis not present

## 2015-11-28 DIAGNOSIS — D638 Anemia in other chronic diseases classified elsewhere: Secondary | ICD-10-CM | POA: Diagnosis not present

## 2015-11-28 DIAGNOSIS — M6281 Muscle weakness (generalized): Secondary | ICD-10-CM | POA: Diagnosis not present

## 2015-12-01 DIAGNOSIS — R2689 Other abnormalities of gait and mobility: Secondary | ICD-10-CM | POA: Diagnosis not present

## 2015-12-01 DIAGNOSIS — I10 Essential (primary) hypertension: Secondary | ICD-10-CM | POA: Diagnosis not present

## 2015-12-01 DIAGNOSIS — E1142 Type 2 diabetes mellitus with diabetic polyneuropathy: Secondary | ICD-10-CM | POA: Diagnosis not present

## 2015-12-01 DIAGNOSIS — Z8673 Personal history of transient ischemic attack (TIA), and cerebral infarction without residual deficits: Secondary | ICD-10-CM | POA: Diagnosis not present

## 2015-12-01 DIAGNOSIS — H543 Unqualified visual loss, both eyes: Secondary | ICD-10-CM | POA: Diagnosis not present

## 2015-12-01 DIAGNOSIS — M6281 Muscle weakness (generalized): Secondary | ICD-10-CM | POA: Diagnosis not present

## 2015-12-01 DIAGNOSIS — Z7982 Long term (current) use of aspirin: Secondary | ICD-10-CM | POA: Diagnosis not present

## 2015-12-04 ENCOUNTER — Telehealth: Payer: Self-pay

## 2015-12-04 NOTE — Telephone Encounter (Signed)
Wes from [facility] left a VM asking for verbal orders to continue PT with this pt. Originally referred by Dr Everlene Farrier, he would like to continue PT 2x a week for 4 weeks.   Simonne Maffucci [phone]

## 2015-12-04 NOTE — Telephone Encounter (Signed)
Please give verbal order.

## 2015-12-05 DIAGNOSIS — Z7982 Long term (current) use of aspirin: Secondary | ICD-10-CM | POA: Diagnosis not present

## 2015-12-05 DIAGNOSIS — E785 Hyperlipidemia, unspecified: Secondary | ICD-10-CM | POA: Diagnosis not present

## 2015-12-05 DIAGNOSIS — M6281 Muscle weakness (generalized): Secondary | ICD-10-CM | POA: Diagnosis not present

## 2015-12-05 DIAGNOSIS — D638 Anemia in other chronic diseases classified elsewhere: Secondary | ICD-10-CM | POA: Diagnosis not present

## 2015-12-05 DIAGNOSIS — M545 Low back pain: Secondary | ICD-10-CM | POA: Diagnosis not present

## 2015-12-05 DIAGNOSIS — I272 Pulmonary hypertension, unspecified: Secondary | ICD-10-CM | POA: Diagnosis not present

## 2015-12-05 DIAGNOSIS — I10 Essential (primary) hypertension: Secondary | ICD-10-CM | POA: Diagnosis not present

## 2015-12-05 DIAGNOSIS — E1159 Type 2 diabetes mellitus with other circulatory complications: Secondary | ICD-10-CM | POA: Diagnosis not present

## 2015-12-05 DIAGNOSIS — E1142 Type 2 diabetes mellitus with diabetic polyneuropathy: Secondary | ICD-10-CM | POA: Diagnosis not present

## 2015-12-05 DIAGNOSIS — Z8673 Personal history of transient ischemic attack (TIA), and cerebral infarction without residual deficits: Secondary | ICD-10-CM | POA: Diagnosis not present

## 2015-12-05 DIAGNOSIS — Z9181 History of falling: Secondary | ICD-10-CM | POA: Diagnosis not present

## 2015-12-05 DIAGNOSIS — H5462 Unqualified visual loss, left eye, normal vision right eye: Secondary | ICD-10-CM | POA: Diagnosis not present

## 2015-12-06 NOTE — Telephone Encounter (Signed)
Left message with verbal orders to continue PT

## 2015-12-08 DIAGNOSIS — E785 Hyperlipidemia, unspecified: Secondary | ICD-10-CM | POA: Diagnosis not present

## 2015-12-08 DIAGNOSIS — I10 Essential (primary) hypertension: Secondary | ICD-10-CM | POA: Diagnosis not present

## 2015-12-08 DIAGNOSIS — M6281 Muscle weakness (generalized): Secondary | ICD-10-CM | POA: Diagnosis not present

## 2015-12-08 DIAGNOSIS — E1142 Type 2 diabetes mellitus with diabetic polyneuropathy: Secondary | ICD-10-CM | POA: Diagnosis not present

## 2015-12-08 DIAGNOSIS — H5462 Unqualified visual loss, left eye, normal vision right eye: Secondary | ICD-10-CM | POA: Diagnosis not present

## 2015-12-08 DIAGNOSIS — E1159 Type 2 diabetes mellitus with other circulatory complications: Secondary | ICD-10-CM | POA: Diagnosis not present

## 2015-12-08 DIAGNOSIS — Z7982 Long term (current) use of aspirin: Secondary | ICD-10-CM | POA: Diagnosis not present

## 2015-12-08 DIAGNOSIS — Z9181 History of falling: Secondary | ICD-10-CM | POA: Diagnosis not present

## 2015-12-08 DIAGNOSIS — I272 Pulmonary hypertension, unspecified: Secondary | ICD-10-CM | POA: Diagnosis not present

## 2015-12-08 DIAGNOSIS — Z8673 Personal history of transient ischemic attack (TIA), and cerebral infarction without residual deficits: Secondary | ICD-10-CM | POA: Diagnosis not present

## 2015-12-08 DIAGNOSIS — M545 Low back pain: Secondary | ICD-10-CM | POA: Diagnosis not present

## 2015-12-08 DIAGNOSIS — D638 Anemia in other chronic diseases classified elsewhere: Secondary | ICD-10-CM | POA: Diagnosis not present

## 2015-12-11 DIAGNOSIS — Z0000 Encounter for general adult medical examination without abnormal findings: Secondary | ICD-10-CM | POA: Diagnosis not present

## 2015-12-12 DIAGNOSIS — M545 Low back pain: Secondary | ICD-10-CM | POA: Diagnosis not present

## 2015-12-12 DIAGNOSIS — H5462 Unqualified visual loss, left eye, normal vision right eye: Secondary | ICD-10-CM | POA: Diagnosis not present

## 2015-12-12 DIAGNOSIS — I272 Pulmonary hypertension, unspecified: Secondary | ICD-10-CM | POA: Diagnosis not present

## 2015-12-12 DIAGNOSIS — E1142 Type 2 diabetes mellitus with diabetic polyneuropathy: Secondary | ICD-10-CM | POA: Diagnosis not present

## 2015-12-12 DIAGNOSIS — I10 Essential (primary) hypertension: Secondary | ICD-10-CM | POA: Diagnosis not present

## 2015-12-12 DIAGNOSIS — Z8673 Personal history of transient ischemic attack (TIA), and cerebral infarction without residual deficits: Secondary | ICD-10-CM | POA: Diagnosis not present

## 2015-12-12 DIAGNOSIS — M6281 Muscle weakness (generalized): Secondary | ICD-10-CM | POA: Diagnosis not present

## 2015-12-12 DIAGNOSIS — E1159 Type 2 diabetes mellitus with other circulatory complications: Secondary | ICD-10-CM | POA: Diagnosis not present

## 2015-12-12 DIAGNOSIS — E785 Hyperlipidemia, unspecified: Secondary | ICD-10-CM | POA: Diagnosis not present

## 2015-12-12 DIAGNOSIS — Z9181 History of falling: Secondary | ICD-10-CM | POA: Diagnosis not present

## 2015-12-12 DIAGNOSIS — D638 Anemia in other chronic diseases classified elsewhere: Secondary | ICD-10-CM | POA: Diagnosis not present

## 2015-12-12 DIAGNOSIS — Z7982 Long term (current) use of aspirin: Secondary | ICD-10-CM | POA: Diagnosis not present

## 2015-12-14 DIAGNOSIS — E1142 Type 2 diabetes mellitus with diabetic polyneuropathy: Secondary | ICD-10-CM | POA: Diagnosis not present

## 2015-12-14 DIAGNOSIS — M6281 Muscle weakness (generalized): Secondary | ICD-10-CM | POA: Diagnosis not present

## 2015-12-14 DIAGNOSIS — H5462 Unqualified visual loss, left eye, normal vision right eye: Secondary | ICD-10-CM | POA: Diagnosis not present

## 2015-12-14 DIAGNOSIS — I10 Essential (primary) hypertension: Secondary | ICD-10-CM | POA: Diagnosis not present

## 2015-12-14 DIAGNOSIS — E785 Hyperlipidemia, unspecified: Secondary | ICD-10-CM | POA: Diagnosis not present

## 2015-12-14 DIAGNOSIS — D638 Anemia in other chronic diseases classified elsewhere: Secondary | ICD-10-CM | POA: Diagnosis not present

## 2015-12-14 DIAGNOSIS — I272 Pulmonary hypertension, unspecified: Secondary | ICD-10-CM | POA: Diagnosis not present

## 2015-12-14 DIAGNOSIS — E1159 Type 2 diabetes mellitus with other circulatory complications: Secondary | ICD-10-CM | POA: Diagnosis not present

## 2015-12-14 DIAGNOSIS — Z8673 Personal history of transient ischemic attack (TIA), and cerebral infarction without residual deficits: Secondary | ICD-10-CM | POA: Diagnosis not present

## 2015-12-14 DIAGNOSIS — Z7982 Long term (current) use of aspirin: Secondary | ICD-10-CM | POA: Diagnosis not present

## 2015-12-14 DIAGNOSIS — M545 Low back pain: Secondary | ICD-10-CM | POA: Diagnosis not present

## 2015-12-14 DIAGNOSIS — Z9181 History of falling: Secondary | ICD-10-CM | POA: Diagnosis not present

## 2015-12-19 DIAGNOSIS — Z9181 History of falling: Secondary | ICD-10-CM | POA: Diagnosis not present

## 2015-12-19 DIAGNOSIS — I272 Pulmonary hypertension, unspecified: Secondary | ICD-10-CM | POA: Diagnosis not present

## 2015-12-19 DIAGNOSIS — M545 Low back pain: Secondary | ICD-10-CM | POA: Diagnosis not present

## 2015-12-19 DIAGNOSIS — Z7982 Long term (current) use of aspirin: Secondary | ICD-10-CM | POA: Diagnosis not present

## 2015-12-19 DIAGNOSIS — I10 Essential (primary) hypertension: Secondary | ICD-10-CM | POA: Diagnosis not present

## 2015-12-19 DIAGNOSIS — M6281 Muscle weakness (generalized): Secondary | ICD-10-CM | POA: Diagnosis not present

## 2015-12-19 DIAGNOSIS — D638 Anemia in other chronic diseases classified elsewhere: Secondary | ICD-10-CM | POA: Diagnosis not present

## 2015-12-19 DIAGNOSIS — E785 Hyperlipidemia, unspecified: Secondary | ICD-10-CM | POA: Diagnosis not present

## 2015-12-19 DIAGNOSIS — E1142 Type 2 diabetes mellitus with diabetic polyneuropathy: Secondary | ICD-10-CM | POA: Diagnosis not present

## 2015-12-19 DIAGNOSIS — E1159 Type 2 diabetes mellitus with other circulatory complications: Secondary | ICD-10-CM | POA: Diagnosis not present

## 2015-12-19 DIAGNOSIS — Z8673 Personal history of transient ischemic attack (TIA), and cerebral infarction without residual deficits: Secondary | ICD-10-CM | POA: Diagnosis not present

## 2015-12-19 DIAGNOSIS — H5462 Unqualified visual loss, left eye, normal vision right eye: Secondary | ICD-10-CM | POA: Diagnosis not present

## 2015-12-22 DIAGNOSIS — E1142 Type 2 diabetes mellitus with diabetic polyneuropathy: Secondary | ICD-10-CM | POA: Diagnosis not present

## 2015-12-22 DIAGNOSIS — Z8673 Personal history of transient ischemic attack (TIA), and cerebral infarction without residual deficits: Secondary | ICD-10-CM | POA: Diagnosis not present

## 2015-12-22 DIAGNOSIS — H5462 Unqualified visual loss, left eye, normal vision right eye: Secondary | ICD-10-CM | POA: Diagnosis not present

## 2015-12-22 DIAGNOSIS — E1159 Type 2 diabetes mellitus with other circulatory complications: Secondary | ICD-10-CM | POA: Diagnosis not present

## 2015-12-22 DIAGNOSIS — I10 Essential (primary) hypertension: Secondary | ICD-10-CM | POA: Diagnosis not present

## 2015-12-22 DIAGNOSIS — M545 Low back pain: Secondary | ICD-10-CM | POA: Diagnosis not present

## 2015-12-22 DIAGNOSIS — E785 Hyperlipidemia, unspecified: Secondary | ICD-10-CM | POA: Diagnosis not present

## 2015-12-22 DIAGNOSIS — M6281 Muscle weakness (generalized): Secondary | ICD-10-CM | POA: Diagnosis not present

## 2015-12-22 DIAGNOSIS — Z7982 Long term (current) use of aspirin: Secondary | ICD-10-CM | POA: Diagnosis not present

## 2015-12-22 DIAGNOSIS — D638 Anemia in other chronic diseases classified elsewhere: Secondary | ICD-10-CM | POA: Diagnosis not present

## 2015-12-22 DIAGNOSIS — I272 Pulmonary hypertension, unspecified: Secondary | ICD-10-CM | POA: Diagnosis not present

## 2015-12-22 DIAGNOSIS — Z9181 History of falling: Secondary | ICD-10-CM | POA: Diagnosis not present

## 2015-12-26 DIAGNOSIS — Z8673 Personal history of transient ischemic attack (TIA), and cerebral infarction without residual deficits: Secondary | ICD-10-CM | POA: Diagnosis not present

## 2015-12-26 DIAGNOSIS — M545 Low back pain: Secondary | ICD-10-CM | POA: Diagnosis not present

## 2015-12-26 DIAGNOSIS — M6281 Muscle weakness (generalized): Secondary | ICD-10-CM | POA: Diagnosis not present

## 2015-12-26 DIAGNOSIS — E1142 Type 2 diabetes mellitus with diabetic polyneuropathy: Secondary | ICD-10-CM | POA: Diagnosis not present

## 2015-12-26 DIAGNOSIS — I272 Pulmonary hypertension, unspecified: Secondary | ICD-10-CM | POA: Diagnosis not present

## 2015-12-26 DIAGNOSIS — Z7982 Long term (current) use of aspirin: Secondary | ICD-10-CM | POA: Diagnosis not present

## 2015-12-26 DIAGNOSIS — I10 Essential (primary) hypertension: Secondary | ICD-10-CM | POA: Diagnosis not present

## 2015-12-26 DIAGNOSIS — E1159 Type 2 diabetes mellitus with other circulatory complications: Secondary | ICD-10-CM | POA: Diagnosis not present

## 2015-12-26 DIAGNOSIS — Z9181 History of falling: Secondary | ICD-10-CM | POA: Diagnosis not present

## 2015-12-26 DIAGNOSIS — D638 Anemia in other chronic diseases classified elsewhere: Secondary | ICD-10-CM | POA: Diagnosis not present

## 2015-12-26 DIAGNOSIS — H5462 Unqualified visual loss, left eye, normal vision right eye: Secondary | ICD-10-CM | POA: Diagnosis not present

## 2015-12-26 DIAGNOSIS — E785 Hyperlipidemia, unspecified: Secondary | ICD-10-CM | POA: Diagnosis not present

## 2015-12-29 DIAGNOSIS — E1159 Type 2 diabetes mellitus with other circulatory complications: Secondary | ICD-10-CM | POA: Diagnosis not present

## 2015-12-29 DIAGNOSIS — M6281 Muscle weakness (generalized): Secondary | ICD-10-CM | POA: Diagnosis not present

## 2015-12-29 DIAGNOSIS — I272 Pulmonary hypertension, unspecified: Secondary | ICD-10-CM | POA: Diagnosis not present

## 2015-12-29 DIAGNOSIS — Z9181 History of falling: Secondary | ICD-10-CM | POA: Diagnosis not present

## 2015-12-29 DIAGNOSIS — E1142 Type 2 diabetes mellitus with diabetic polyneuropathy: Secondary | ICD-10-CM | POA: Diagnosis not present

## 2015-12-29 DIAGNOSIS — M545 Low back pain: Secondary | ICD-10-CM | POA: Diagnosis not present

## 2015-12-29 DIAGNOSIS — Z8673 Personal history of transient ischemic attack (TIA), and cerebral infarction without residual deficits: Secondary | ICD-10-CM | POA: Diagnosis not present

## 2015-12-29 DIAGNOSIS — I10 Essential (primary) hypertension: Secondary | ICD-10-CM | POA: Diagnosis not present

## 2015-12-29 DIAGNOSIS — E785 Hyperlipidemia, unspecified: Secondary | ICD-10-CM | POA: Diagnosis not present

## 2015-12-29 DIAGNOSIS — D638 Anemia in other chronic diseases classified elsewhere: Secondary | ICD-10-CM | POA: Diagnosis not present

## 2015-12-29 DIAGNOSIS — Z7982 Long term (current) use of aspirin: Secondary | ICD-10-CM | POA: Diagnosis not present

## 2015-12-29 DIAGNOSIS — H5462 Unqualified visual loss, left eye, normal vision right eye: Secondary | ICD-10-CM | POA: Diagnosis not present

## 2016-02-15 ENCOUNTER — Other Ambulatory Visit: Payer: Self-pay | Admitting: Emergency Medicine

## 2016-02-15 NOTE — Telephone Encounter (Signed)
Patient transferred to [place]. Left message to return call if patient needs refill from [place]. Would plan a 30-day supply with arrangements to follow-up/establish with new provider since Dr. Perfecto Kingdom retirement.

## 2016-04-16 ENCOUNTER — Other Ambulatory Visit: Payer: Self-pay | Admitting: Family Medicine

## 2016-04-16 DIAGNOSIS — R1310 Dysphagia, unspecified: Secondary | ICD-10-CM

## 2016-04-22 ENCOUNTER — Other Ambulatory Visit: Payer: Self-pay

## 2016-05-06 ENCOUNTER — Ambulatory Visit
Admission: RE | Admit: 2016-05-06 | Discharge: 2016-05-06 | Disposition: A | Discharge status: Home / Self Care | Payer: Medicare Other | Source: Ambulatory Visit | Attending: Family Medicine | Admitting: Family Medicine

## 2016-05-06 DIAGNOSIS — R1310 Dysphagia, unspecified: Secondary | ICD-10-CM

## 2016-06-24 ENCOUNTER — Ambulatory Visit
Admission: RE | Admit: 2016-06-24 | Discharge: 2016-06-24 | Disposition: A | Discharge status: Home / Self Care | Payer: Medicare Other | Source: Ambulatory Visit | Attending: Family Medicine | Admitting: Family Medicine

## 2016-06-24 ENCOUNTER — Other Ambulatory Visit: Payer: Self-pay | Admitting: Family Medicine

## 2016-06-24 DIAGNOSIS — M79672 Pain in left foot: Secondary | ICD-10-CM

## 2016-07-04 ENCOUNTER — Encounter (HOSPITAL_BASED_OUTPATIENT_CLINIC_OR_DEPARTMENT_OTHER): Payer: Medicare Other | Attending: Internal Medicine

## 2016-07-04 DIAGNOSIS — E11319 Type 2 diabetes mellitus with unspecified diabetic retinopathy without macular edema: Secondary | ICD-10-CM | POA: Insufficient documentation

## 2016-07-04 DIAGNOSIS — E1151 Type 2 diabetes mellitus with diabetic peripheral angiopathy without gangrene: Secondary | ICD-10-CM | POA: Insufficient documentation

## 2016-07-04 DIAGNOSIS — E1122 Type 2 diabetes mellitus with diabetic chronic kidney disease: Secondary | ICD-10-CM | POA: Diagnosis not present

## 2016-07-04 DIAGNOSIS — E11621 Type 2 diabetes mellitus with foot ulcer: Secondary | ICD-10-CM | POA: Diagnosis present

## 2016-07-04 DIAGNOSIS — Z8673 Personal history of transient ischemic attack (TIA), and cerebral infarction without residual deficits: Secondary | ICD-10-CM | POA: Diagnosis not present

## 2016-07-04 DIAGNOSIS — L97522 Non-pressure chronic ulcer of other part of left foot with fat layer exposed: Secondary | ICD-10-CM | POA: Diagnosis not present

## 2016-07-04 DIAGNOSIS — I129 Hypertensive chronic kidney disease with stage 1 through stage 4 chronic kidney disease, or unspecified chronic kidney disease: Secondary | ICD-10-CM | POA: Insufficient documentation

## 2016-07-04 DIAGNOSIS — N189 Chronic kidney disease, unspecified: Secondary | ICD-10-CM | POA: Insufficient documentation

## 2016-07-04 DIAGNOSIS — E1140 Type 2 diabetes mellitus with diabetic neuropathy, unspecified: Secondary | ICD-10-CM | POA: Insufficient documentation

## 2016-07-15 ENCOUNTER — Encounter (HOSPITAL_BASED_OUTPATIENT_CLINIC_OR_DEPARTMENT_OTHER): Payer: Medicare Other | Attending: Internal Medicine

## 2016-07-15 DIAGNOSIS — E11621 Type 2 diabetes mellitus with foot ulcer: Secondary | ICD-10-CM | POA: Diagnosis present

## 2016-07-15 DIAGNOSIS — E1142 Type 2 diabetes mellitus with diabetic polyneuropathy: Secondary | ICD-10-CM | POA: Diagnosis not present

## 2016-07-15 DIAGNOSIS — I10 Essential (primary) hypertension: Secondary | ICD-10-CM | POA: Diagnosis not present

## 2016-07-15 DIAGNOSIS — L97522 Non-pressure chronic ulcer of other part of left foot with fat layer exposed: Secondary | ICD-10-CM | POA: Insufficient documentation

## 2016-07-15 DIAGNOSIS — E1151 Type 2 diabetes mellitus with diabetic peripheral angiopathy without gangrene: Secondary | ICD-10-CM | POA: Diagnosis not present

## 2016-07-22 DIAGNOSIS — E11621 Type 2 diabetes mellitus with foot ulcer: Secondary | ICD-10-CM | POA: Diagnosis not present

## 2016-07-24 ENCOUNTER — Other Ambulatory Visit: Payer: Self-pay | Admitting: Internal Medicine

## 2016-07-24 DIAGNOSIS — M869 Osteomyelitis, unspecified: Principal | ICD-10-CM

## 2016-07-24 DIAGNOSIS — L97509 Non-pressure chronic ulcer of other part of unspecified foot with unspecified severity: Principal | ICD-10-CM

## 2016-07-24 DIAGNOSIS — E11621 Type 2 diabetes mellitus with foot ulcer: Secondary | ICD-10-CM

## 2016-07-24 DIAGNOSIS — E1169 Type 2 diabetes mellitus with other specified complication: Principal | ICD-10-CM

## 2016-07-25 ENCOUNTER — Ambulatory Visit (INDEPENDENT_AMBULATORY_CARE_PROVIDER_SITE_OTHER)
Admission: RE | Admit: 2016-07-25 | Discharge: 2016-07-25 | Disposition: A | Discharge status: Home / Self Care | Payer: Medicare Other | Source: Ambulatory Visit | Attending: Vascular Surgery | Admitting: Vascular Surgery

## 2016-07-25 ENCOUNTER — Ambulatory Visit (HOSPITAL_COMMUNITY)
Admission: RE | Admit: 2016-07-25 | Discharge: 2016-07-25 | Disposition: A | Discharge status: Home / Self Care | Payer: Medicare Other | Source: Ambulatory Visit | Attending: Vascular Surgery | Admitting: Vascular Surgery

## 2016-07-25 DIAGNOSIS — E1169 Type 2 diabetes mellitus with other specified complication: Secondary | ICD-10-CM

## 2016-07-25 DIAGNOSIS — L97509 Non-pressure chronic ulcer of other part of unspecified foot with unspecified severity: Secondary | ICD-10-CM

## 2016-07-25 DIAGNOSIS — M869 Osteomyelitis, unspecified: Secondary | ICD-10-CM

## 2016-07-25 DIAGNOSIS — E11621 Type 2 diabetes mellitus with foot ulcer: Secondary | ICD-10-CM | POA: Diagnosis not present

## 2016-07-25 LAB — VAS US LOWER EXTREMITY ARTERIAL DUPLEX
RATIBDISTSYS: 26 cm/s
RPOPDPSV: -41 cm/s
RPOPPPSV: 33 cm/s
RTIBDISTSYS: -12 cm/s
Right peroneal sys PSV: -37 cm/s
Right super femoral dist sys PSV: -41 cm/s
Right super femoral mid sys PSV: -42 cm/s
Right super femoral prox sys PSV: -60 cm/s

## 2016-08-05 DIAGNOSIS — E11621 Type 2 diabetes mellitus with foot ulcer: Secondary | ICD-10-CM | POA: Diagnosis not present

## 2016-08-12 DIAGNOSIS — E11621 Type 2 diabetes mellitus with foot ulcer: Secondary | ICD-10-CM | POA: Diagnosis not present

## 2016-08-15 ENCOUNTER — Encounter: Payer: Self-pay | Admitting: Vascular Surgery

## 2016-08-16 ENCOUNTER — Encounter: Payer: Self-pay | Admitting: Vascular Surgery

## 2016-08-16 ENCOUNTER — Ambulatory Visit (INDEPENDENT_AMBULATORY_CARE_PROVIDER_SITE_OTHER): Payer: Medicare Other | Admitting: Vascular Surgery

## 2016-08-16 VITALS — BP 118/78 | HR 83 | Temp 97.2°F | Resp 20 | Ht 60.0 in | Wt 175.0 lb

## 2016-08-16 DIAGNOSIS — I739 Peripheral vascular disease, unspecified: Secondary | ICD-10-CM | POA: Diagnosis not present

## 2016-08-16 NOTE — Progress Notes (Signed)
Patient ID: RANA ADORNO, male   DOB: 20-Nov-1925, 83 y.o.   MRN: [id number]  Reason for Consult: New Evaluation (Epic referral, Dr. Dellia Nims.  Non healing wounds on L foot.)   Referred by Pa, [facility]  Subjective:     HPI:  HRIDAY STAI is a 83 y.o. male with a history of diabetes although he is not diabetic now he is a veteran of the [employer] in the [employer]. He has a few week history of a left great toe ulceration. He does continue to walk with a walker at home. He is having some moderate pain in that foot but he has not had any infection. Recently ABIs and lower extremity duplexes were performed here and reviewed today. He is not having any other issues related to today's visit. He does take aspirin daily.  Past Medical History:  Diagnosis Date  . Blind left eye   . Diabetes mellitus without complication ([place])   . Hypertension    Family history is unknown  No major surgeries in the past  Short Social History:  Social History  Substance Use Topics  . Smoking status: Never Smoker  . Smokeless tobacco: Never Used  . Alcohol use No    No Known Allergies  Current Outpatient Prescriptions  Medication Sig Dispense Refill  . acetaminophen (TYLENOL) 325 MG tablet Take 2 tablets (650 mg total) by mouth every 6 (six) hours as needed for mild pain or moderate pain (or Fever >/= 101).    [name] aspirin EC 81 MG tablet Take 81 mg by mouth daily.    [name] augmented betamethasone dipropionate (DIPROLENE-AF) 0.05 % ointment Apply topically 2 (two) times daily. 30 g 5  . brimonidine (ALPHAGAN) 0.2 % ophthalmic solution Place 1 drop into the right eye 2 (two) times daily.     No current facility-administered medications for this visit.     Review of Systems  Constitutional:  Constitutional negative. HENT: HENT negative.  Eyes: Eyes negative.  Respiratory: Respiratory negative.  Cardiovascular: Cardiovascular negative.  GI: Gastrointestinal negative.  Musculoskeletal: Musculoskeletal  negative.  Skin: Positive for wound.  Neurological: Neurological negative. Hematologic: Hematologic/lymphatic negative.  Psychiatric: Psychiatric negative.        Objective:  Objective   Vitals:   08/16/16 1027  BP: 118/78  Pulse: 83  Resp: 20  Temp: (!) 97.2 F (36.2 C)  TempSrc: Oral  SpO2: 95%  Weight: 175 lb (79.4 kg)  Height: 5' (1.524 m)   Body mass index is 34.18 kg/m.  Physical Exam  Constitutional: He appears well-developed.  Neck: Normal range of motion.  Cardiovascular: Normal rate.   Pulses:      Popliteal pulses are 2+ on the right side, and 2+ on the left side.  Monophasic bilateral dp/pt  Pulmonary/Chest: Effort normal.  Abdominal: Soft. He exhibits no mass.  Musculoskeletal: He exhibits no edema.  Neurological: He is alert.  Skin:  Left great toe tip dry gangrene, ulceration over dorsum of great toe  Psychiatric: He has a normal mood and affect. His behavior is normal. Judgment and thought content normal.    Data:  I reviewed his ABIs which are 0.74 on the right and 0.6 on the left and monophasic in both dorsalis pedis and posterior tibial arteries. He also had duplexes performed last month which demonstrated monophasic waveforms suggesting tibial disease.     Assessment/Plan:     83 year old male here with left great toe ulceration. He has duplex demonstrating likely tibial disease  and this is confirmed with physical exam where he has palpable popliteal pulses but monophasic signals at the ankle. I have offered them aortogram with left lower extremity angiogram and likely intervention. At this time they're unsure if they would like to proceed. He should be placed on a statin drug if he does not have any other reasons to not be on it. He will continue aspirin. They will call to schedule should they want to proceed with angiography. In the meantime they will continue to follow with Dr. Dellia Nims in the wound care clinic.     Waynetta Sandy  MD [facility]

## 2016-08-26 ENCOUNTER — Encounter (HOSPITAL_BASED_OUTPATIENT_CLINIC_OR_DEPARTMENT_OTHER): Payer: Medicare Other | Attending: Internal Medicine

## 2016-08-26 DIAGNOSIS — I70245 Atherosclerosis of native arteries of left leg with ulceration of other part of foot: Secondary | ICD-10-CM | POA: Diagnosis not present

## 2016-08-26 DIAGNOSIS — E11621 Type 2 diabetes mellitus with foot ulcer: Secondary | ICD-10-CM | POA: Insufficient documentation

## 2016-08-26 DIAGNOSIS — L89152 Pressure ulcer of sacral region, stage 2: Secondary | ICD-10-CM | POA: Insufficient documentation

## 2016-08-26 DIAGNOSIS — E1140 Type 2 diabetes mellitus with diabetic neuropathy, unspecified: Secondary | ICD-10-CM | POA: Diagnosis not present

## 2016-08-26 DIAGNOSIS — L97522 Non-pressure chronic ulcer of other part of left foot with fat layer exposed: Secondary | ICD-10-CM | POA: Insufficient documentation

## 2016-08-26 DIAGNOSIS — I10 Essential (primary) hypertension: Secondary | ICD-10-CM | POA: Diagnosis not present

## 2016-09-02 DIAGNOSIS — E11621 Type 2 diabetes mellitus with foot ulcer: Secondary | ICD-10-CM | POA: Diagnosis not present

## 2016-09-09 DIAGNOSIS — E11621 Type 2 diabetes mellitus with foot ulcer: Secondary | ICD-10-CM | POA: Diagnosis not present

## 2016-09-23 ENCOUNTER — Encounter (HOSPITAL_BASED_OUTPATIENT_CLINIC_OR_DEPARTMENT_OTHER): Payer: Medicare Other | Attending: Internal Medicine

## 2016-09-23 DIAGNOSIS — L97522 Non-pressure chronic ulcer of other part of left foot with fat layer exposed: Secondary | ICD-10-CM | POA: Diagnosis not present

## 2016-09-23 DIAGNOSIS — E11621 Type 2 diabetes mellitus with foot ulcer: Secondary | ICD-10-CM | POA: Insufficient documentation

## 2016-09-23 DIAGNOSIS — I70245 Atherosclerosis of native arteries of left leg with ulceration of other part of foot: Secondary | ICD-10-CM | POA: Diagnosis not present

## 2016-09-23 DIAGNOSIS — E1142 Type 2 diabetes mellitus with diabetic polyneuropathy: Secondary | ICD-10-CM | POA: Diagnosis not present

## 2016-09-23 DIAGNOSIS — E1140 Type 2 diabetes mellitus with diabetic neuropathy, unspecified: Secondary | ICD-10-CM | POA: Insufficient documentation

## 2016-09-23 DIAGNOSIS — I10 Essential (primary) hypertension: Secondary | ICD-10-CM | POA: Diagnosis not present

## 2016-09-23 DIAGNOSIS — E1151 Type 2 diabetes mellitus with diabetic peripheral angiopathy without gangrene: Secondary | ICD-10-CM | POA: Diagnosis not present

## 2016-09-23 DIAGNOSIS — L89152 Pressure ulcer of sacral region, stage 2: Secondary | ICD-10-CM | POA: Insufficient documentation

## 2016-09-23 DIAGNOSIS — L97529 Non-pressure chronic ulcer of other part of left foot with unspecified severity: Secondary | ICD-10-CM | POA: Diagnosis not present

## 2016-09-25 ENCOUNTER — Other Ambulatory Visit: Payer: Self-pay

## 2016-10-02 ENCOUNTER — Telehealth: Payer: Self-pay | Admitting: Vascular Surgery

## 2016-10-02 ENCOUNTER — Encounter (HOSPITAL_COMMUNITY)
Admission: RE | Disposition: A | Discharge status: Home / Self Care | Payer: Self-pay | Source: Ambulatory Visit | Attending: Vascular Surgery

## 2016-10-02 ENCOUNTER — Ambulatory Visit (HOSPITAL_COMMUNITY)
Admission: RE | Admit: 2016-10-02 | Discharge: 2016-10-02 | Disposition: A | Discharge status: Home / Self Care | Payer: Medicare Other | Source: Ambulatory Visit | Attending: Vascular Surgery | Admitting: Vascular Surgery

## 2016-10-02 ENCOUNTER — Encounter (HOSPITAL_COMMUNITY): Payer: Self-pay | Admitting: *Deleted

## 2016-10-02 DIAGNOSIS — I998 Other disorder of circulatory system: Secondary | ICD-10-CM

## 2016-10-02 DIAGNOSIS — L97529 Non-pressure chronic ulcer of other part of left foot with unspecified severity: Secondary | ICD-10-CM | POA: Diagnosis not present

## 2016-10-02 DIAGNOSIS — I70245 Atherosclerosis of native arteries of left leg with ulceration of other part of foot: Secondary | ICD-10-CM | POA: Insufficient documentation

## 2016-10-02 DIAGNOSIS — Z7982 Long term (current) use of aspirin: Secondary | ICD-10-CM | POA: Diagnosis not present

## 2016-10-02 DIAGNOSIS — I10 Essential (primary) hypertension: Secondary | ICD-10-CM | POA: Diagnosis not present

## 2016-10-02 DIAGNOSIS — E119 Type 2 diabetes mellitus without complications: Secondary | ICD-10-CM | POA: Diagnosis not present

## 2016-10-02 DIAGNOSIS — I739 Peripheral vascular disease, unspecified: Secondary | ICD-10-CM | POA: Diagnosis present

## 2016-10-02 HISTORY — PX: ABDOMINAL AORTOGRAM W/LOWER EXTREMITY: CATH118223

## 2016-10-02 HISTORY — PX: PERIPHERAL VASCULAR BALLOON ANGIOPLASTY: CATH118281

## 2016-10-02 LAB — GLUCOSE, CAPILLARY
GLUCOSE-CAPILLARY: 84 mg/dL (ref 65–99)
Glucose-Capillary: 87 mg/dL (ref 65–99)

## 2016-10-02 LAB — POCT ACTIVATED CLOTTING TIME
ACTIVATED CLOTTING TIME: 257 s
ACTIVATED CLOTTING TIME: 197 s
Activated Clotting Time: 318 seconds
Activated Clotting Time: 175 seconds

## 2016-10-02 LAB — POCT I-STAT, CHEM 8
BUN: 17 mg/dL (ref 6–20)
CALCIUM ION: 1.28 mmol/L (ref 1.15–1.40)
CREATININE: 0.7 mg/dL (ref 0.61–1.24)
Chloride: 102 mmol/L (ref 101–111)
GLUCOSE: 95 mg/dL (ref 65–99)
HCT: 42 % (ref 39.0–52.0)
Hemoglobin: 14.3 g/dL (ref 13.0–17.0)
Potassium: 4.2 mmol/L (ref 3.5–5.1)
Sodium: 140 mmol/L (ref 135–145)
TCO2: 30 mmol/L (ref 22–32)

## 2016-10-02 SURGERY — ABDOMINAL AORTOGRAM W/LOWER EXTREMITY
Anesthesia: LOCAL | Site: Leg Lower

## 2016-10-02 MED ORDER — CLOPIDOGREL BISULFATE 75 MG PO TABS
300.0000 mg | ORAL_TABLET | Freq: Once | ORAL | Status: AC
  Administered 2016-10-02: 300 mg via ORAL

## 2016-10-02 MED ORDER — HEPARIN SODIUM (PORCINE) 1000 UNIT/ML IJ SOLN
INTRAMUSCULAR | Status: DC | PRN
  Administered 2016-10-02: 8000 [IU] via INTRAVENOUS

## 2016-10-02 MED ORDER — HYDRALAZINE HCL 20 MG/ML IJ SOLN: 5.0000 mg | INTRAMUSCULAR | Status: DC | PRN

## 2016-10-02 MED ORDER — HEPARIN (PORCINE) IN NACL 2-0.9 UNIT/ML-% IJ SOLN
INTRAMUSCULAR | Status: AC
  Filled 2016-10-02: qty 1000

## 2016-10-02 MED ORDER — FENTANYL CITRATE (PF) 100 MCG/2ML IJ SOLN
INTRAMUSCULAR | Status: DC | PRN
  Administered 2016-10-02: 50 ug via INTRAVENOUS

## 2016-10-02 MED ORDER — SODIUM CHLORIDE 0.9 % IV SOLN
INTRAVENOUS | Status: DC
  Administered 2016-10-02: 09:00:00 via INTRAVENOUS

## 2016-10-02 MED ORDER — HEPARIN SODIUM (PORCINE) 1000 UNIT/ML IJ SOLN
INTRAMUSCULAR | Status: AC
  Filled 2016-10-02: qty 1

## 2016-10-02 MED ORDER — SODIUM CHLORIDE 0.9% FLUSH: 3.0000 mL | Freq: Two times a day (BID) | INTRAVENOUS | Status: DC

## 2016-10-02 MED ORDER — LABETALOL HCL 5 MG/ML IV SOLN: 10.0000 mg | INTRAVENOUS | Status: DC | PRN

## 2016-10-02 MED ORDER — CLOPIDOGREL BISULFATE 75 MG PO TABS: 75.0000 mg | ORAL_TABLET | Freq: Every day | ORAL | Status: DC

## 2016-10-02 MED ORDER — LIDOCAINE HCL 2 % IJ SOLN
INTRAMUSCULAR | Status: AC
  Filled 2016-10-02: qty 10

## 2016-10-02 MED ORDER — CLOPIDOGREL BISULFATE 300 MG PO TABS
ORAL_TABLET | ORAL | Status: AC
  Filled 2016-10-02: qty 1

## 2016-10-02 MED ORDER — SODIUM CHLORIDE 0.9% FLUSH: 3.0000 mL | INTRAVENOUS | Status: DC | PRN

## 2016-10-02 MED ORDER — FENTANYL CITRATE (PF) 100 MCG/2ML IJ SOLN
INTRAMUSCULAR | Status: AC
  Filled 2016-10-02: qty 2

## 2016-10-02 MED ORDER — OXYCODONE HCL 5 MG PO TABS: 5.0000 mg | ORAL_TABLET | ORAL | Status: DC | PRN

## 2016-10-02 MED ORDER — SODIUM CHLORIDE 0.9 % IV SOLN: 250.0000 mL | INTRAVENOUS | Status: DC | PRN

## 2016-10-02 MED ORDER — SODIUM CHLORIDE 0.9 % WEIGHT BASED INFUSION: 1.0000 mL/kg/h | INTRAVENOUS | Status: DC

## 2016-10-02 MED ORDER — IODIXANOL 320 MG/ML IV SOLN
INTRAVENOUS | Status: DC | PRN
  Administered 2016-10-02: 175 mL via INTRA_ARTERIAL

## 2016-10-02 MED ORDER — CLOPIDOGREL BISULFATE 75 MG PO TABS: 75.0000 mg | ORAL_TABLET | Freq: Every day | ORAL | 2 refills | Status: DC

## 2016-10-02 MED ORDER — LIDOCAINE HCL 2 % IJ SOLN
INTRAMUSCULAR | Status: DC | PRN
  Administered 2016-10-02: 10 mL via INTRADERMAL

## 2016-10-02 MED ORDER — HEPARIN (PORCINE) IN NACL 2-0.9 UNIT/ML-% IJ SOLN
INTRAMUSCULAR | Status: AC | PRN
  Administered 2016-10-02: 1000 mL via INTRA_ARTERIAL

## 2016-10-02 SURGICAL SUPPLY — 20 items
BALLN STERLING OTW 3X150X150 (BALLOONS) ×3
BALLOON STERLING OTW 3X150X150 (BALLOONS) IMPLANT
CATH CXI SUPP ANG 2.6FR 150CM (MICROCATHETER) ×1 IMPLANT
CATH OMNI FLUSH 5F 65CM (CATHETERS) ×1 IMPLANT
CATH QUICKCROSS SUPP .035X90CM (MICROCATHETER) ×1 IMPLANT
CATH SOFT-VU ST 4F 90CM (CATHETERS) ×1 IMPLANT
COVER PRB 48X5XTLSCP FOLD TPE (BAG) IMPLANT
COVER PROBE 5X48 (BAG) ×3
DEVICE CONTINUOUS FLUSH (MISCELLANEOUS) ×1 IMPLANT
KIT ENCORE 26 ADVANTAGE (KITS) ×1 IMPLANT
KIT MICROINTRODUCER STIFF 5F (SHEATH) ×1 IMPLANT
KIT PV (KITS) ×3 IMPLANT
SHEATH HIGHFLEX ANSEL 6FRX55 (SHEATH) ×1 IMPLANT
SHEATH PINNACLE 5F 10CM (SHEATH) ×1 IMPLANT
SYR MEDRAD MARK V 150ML (SYRINGE) ×3 IMPLANT
TRANSDUCER W/STOPCOCK (MISCELLANEOUS) ×3 IMPLANT
TRAY PV CATH (CUSTOM PROCEDURE TRAY) ×3 IMPLANT
WIRE BENTSON .035X145CM (WIRE) ×1 IMPLANT
WIRE G V18X300CM (WIRE) ×1 IMPLANT
WIRE ROSEN-J .035X180CM (WIRE) ×1 IMPLANT

## 2016-10-02 NOTE — Op Note (Signed)
Patient name: PRESSLEY TADESSE MRN: [id number] DOB: 08-26-1925 Sex: male  10/02/2016 Pre-operative Diagnosis: critical left lower extremity ischemia Post-operative diagnosis:  Same Surgeon:  Erlene Quan C. Donzetta Matters, MD Procedure Performed: 1.  US guided cannulation of right common femoral artery 2.  Aortogram with bilateral lower extremity runoff 3.  Balloon angioplasty of left AT and PT with 15mm balloon   Indications:  83 year old male with a left great toe ulceration has been followed in the wound clinic by Dr. Dellia Nims. He now presents for angiogram with bilateral cerebral runoff possible treatment on the left.  Findings: His aorta and iliac femoral segments are all patent without flow-limiting disease. He does appear to have very limited ejection fraction given the slow passage of contrast. His SFAs bilaterally are patent to the level of the popliteals and then contrast was too slow to further evaluate. We were able to evaluate the tibials on the left which demonstrated occlusion of the posterior tibial mid calf reconstituting at the ankle a peroneal that was occluded at the takeoff and anterior tibial artery occluded mid calf and reconstituted dorsalis pedis artery. I completion we had in-line flow in the PT artery with no residual stenosis to the level of the ankle but then collateralize with the peroneal. We also had in-line flow via the anterior tibial artery with a nonflow limiting dissection in the mid calf level and feeding a dorsalis pedis artery which also fed the distal foot vessels.   Procedure:  The patient was identified in the holding area and taken to room 8.  The patient was then placed supine on the table and prepped and draped in the usual sterile fashion.  A time out was called.  Ultrasound was used to evaluate the right common femoral artery.  It was patent .  A digital ultrasound image was acquired.  A micropuncture needle was used to access the right common femoral artery under  ultrasound guidance.  An 018 wire was advanced without resistance and a micropuncture sheath was placed.  The 018 wire was removed and a benson wire was placed.  The micropuncture sheath was exchanged for a 5 french sheath.  An omniflush catheter was advanced over the wire to the level of L-1.  An abdominal angiogram was obtained followed with bilateral runoff that was severely limited given his low ejection fraction. We then used a Benson wire and quick cross catheter to cross the bifurcation placed this at the level of the distal SFA performed angiogram of the tibials which demonstrated the above findings with severe tibial occlusive disease. We then elected to attempt intervention and root exchanged for Rosen wire placed a long 6 French sheath into the left SFA and the patient was given 8000 units of heparin. We then used V 18 wire and CXI catheter to cross the occluded PT to the level of the ankle confirmed intraluminal access performed balloon angioplasty with 3 mm balloon. Completion demonstrated flow very briskly via the posterior tibial without flow-limiting stenosis or dissection to the level of the ankle. We then pulled back with a slightly anterior tibial artery and got all the way to the foot into the dorsalis pedis and confirmed this with angiography. We then performed 3 mm balloon angioplasty of the entire anterior tibial artery and a completion runoff was dominant. Anterior tibial artery filling the distal foot vessels with no flow limiting stenosis or flow-limiting dissection that there was a minimal dissection in the mid calf area. Satisfied with this the sheath  was withdrawn to the right external iliac artery the wire was removed. Sheath will be pulled postoperative holding. Patient tolerated procedure well without immediate complication.   Contrast: 175cc  Wanona Stare C. Donzetta Matters, MD [facility] [place] Office: [phone] Pager: [phone]

## 2016-10-02 NOTE — Discharge Instructions (Signed)

## 2016-10-02 NOTE — Progress Notes (Addendum)
Site area: rfa Site Prior to Removal:  Level 0 Pressure Applied For:25 min Manual:  yes  Patient Status During Pull:  stable Post Pull Site:  Level Post Pull Instructions Given:yes   Post Pull Pulses Present: doppler Dressing Applied:  tegaderm Bedrest begins @ 1700 till2100 Comments:

## 2016-10-02 NOTE — Telephone Encounter (Signed)
Sched labs 10/29/16 at 2:00 and MD at 11/01/16 at 10:15. Lm on hm#.

## 2016-10-02 NOTE — Telephone Encounter (Signed)
-----   Message from Mena Goes, RN sent at 10/02/2016 12:47 PM EDT ----- Regarding: 4-6 weeks w/ 2 labs   ----- Message ----- From: Waynetta Sandy, MD Sent: 10/02/2016  12:16 PM To: [facility] [address]  Curtis Brock [id number] [birth year]  10/02/2016 Pre-operative Diagnosis: critical left lower extremity ischemia  Surgeon:  Erlene Quan C. Donzetta Matters, MD  Procedure Performed: 1.  US guided cannulation of right common femoral artery 2.  Aortogram with bilateral lower extremity runoff 3.  Balloon angioplasty of left AT and PT with 55mm balloon  F/u in 4-6 weeks with ABI and Left leg duplex

## 2016-10-02 NOTE — H&P (Signed)
HPI:  Curtis Brock is a 83 y.o. male with a history of diabetes although he is not diabetic now he is a veteran of the [employer] in [employer]. He has a few week history of a left great toe ulceration. He does continue to walk with a walker at home. He is having some moderate pain in that foot but he has not had any infection. Recently ABIs and lower extremity duplexes were performed here and reviewed today. He is not having any other issues related to today's visit. He does take aspirin daily.      Past Medical History:  Diagnosis Date  . Blind left eye   . Diabetes mellitus without complication ([place])   . Hypertension    Family history is unknown  No major surgeries in the past  Short Social History:      Social History  Substance Use Topics  . Smoking status: Never Smoker  . Smokeless tobacco: Never Used  . Alcohol use No    No Known Allergies        Current Outpatient Prescriptions  Medication Sig Dispense Refill  . acetaminophen (TYLENOL) 325 MG tablet Take 2 tablets (650 mg total) by mouth every 6 (six) hours as needed for mild pain or moderate pain (or Fever >/= 101).    [name] aspirin EC 81 MG tablet Take 81 mg by mouth daily.    [name] augmented betamethasone dipropionate (DIPROLENE-AF) 0.05 % ointment Apply topically 2 (two) times daily. 30 g 5  . brimonidine (ALPHAGAN) 0.2 % ophthalmic solution Place 1 drop into the right eye 2 (two) times daily.     No current facility-administered medications for this visit.     Review of Systems  Constitutional:  Constitutional negative. HENT: HENT negative.  Eyes: Eyes negative.  Respiratory: Respiratory negative.  Cardiovascular: Cardiovascular negative.  GI: Gastrointestinal negative.  Musculoskeletal: Musculoskeletal negative.  Skin: Positive for wound.  Neurological: Neurological negative. Hematologic: Hematologic/lymphatic negative.  Psychiatric: Psychiatric negative.        Objective:  Objective        Vitals:   08/16/16 1027  BP: 118/78  Pulse: 83  Resp: 20  Temp: (!) 97.2 F (36.2 C)  TempSrc: Oral  SpO2: 95%  Weight: 175 lb (79.4 kg)  Height: 5' (1.524 m)   Body mass index is 34.18 kg/m.  Physical Exam  Constitutional: He appears well-developed.  Neck: Normal range of motion.  Cardiovascular: Normal rate.   Pulses:      Popliteal pulses are 2+ on the right side, and 2+ on the left side.  Monophasic bilateral dp/pt  Pulmonary/Chest: Effort normal.  Abdominal: Soft. He exhibits no mass.  Musculoskeletal: He exhibits no edema.  Neurological: He is alert.  Skin:  Left great toe tip dry gangrene, ulceration over dorsum of great toe  Psychiatric: He has a normal mood and affect. His behavior is normal. Judgment and thought content normal.    Data:  I reviewed his ABIs which are 0.74 on the right and 0.6 on the left and monophasic in both dorsalis pedis and posterior tibial arteries. He also had duplexes performed last month which demonstrated monophasic waveforms suggesting tibial disease.     Assessment/Plan:   83 year old male here with left great toe ulceration. He has duplex demonstrating likely tibial disease and this is confirmed with physical exam where he has palpable popliteal pulses but monophasic signals at the ankle. I have offered them aortogram with left lower extremity angiogram and likely  intervention. At this time they're unsure if they would like to proceed. He should be placed on a statin drug if he does not have any other reasons to not be on it. He will continue aspirin.    Jaiden Dinkins C. Donzetta Matters, MD [facility] Office: [phone] Pager: [phone]

## 2016-10-03 ENCOUNTER — Encounter (HOSPITAL_COMMUNITY): Payer: Self-pay | Admitting: Vascular Surgery

## 2016-10-03 ENCOUNTER — Other Ambulatory Visit: Payer: Self-pay

## 2016-10-03 MED ORDER — CLOPIDOGREL BISULFATE 75 MG PO TABS: 75.0000 mg | ORAL_TABLET | Freq: Every day | ORAL | 0 refills | Status: DC

## 2016-10-07 DIAGNOSIS — E11621 Type 2 diabetes mellitus with foot ulcer: Secondary | ICD-10-CM | POA: Diagnosis not present

## 2016-10-14 ENCOUNTER — Encounter (HOSPITAL_BASED_OUTPATIENT_CLINIC_OR_DEPARTMENT_OTHER): Payer: Medicare Other | Attending: Internal Medicine

## 2016-10-14 DIAGNOSIS — L89152 Pressure ulcer of sacral region, stage 2: Secondary | ICD-10-CM | POA: Insufficient documentation

## 2016-10-16 ENCOUNTER — Emergency Department (HOSPITAL_COMMUNITY): Payer: Medicare Other

## 2016-10-16 ENCOUNTER — Emergency Department (HOSPITAL_BASED_OUTPATIENT_CLINIC_OR_DEPARTMENT_OTHER)
Admit: 2016-10-16 | Discharge: 2016-10-16 | Disposition: A | Discharge status: Home / Self Care | Payer: Medicare Other | Attending: Emergency Medicine | Admitting: Emergency Medicine

## 2016-10-16 ENCOUNTER — Encounter (HOSPITAL_COMMUNITY): Payer: Self-pay | Admitting: Emergency Medicine

## 2016-10-16 ENCOUNTER — Emergency Department (HOSPITAL_COMMUNITY)
Admission: EM | Admit: 2016-10-16 | Discharge: 2016-10-16 | Disposition: A | Discharge status: Home / Self Care | Payer: Medicare Other | Attending: Emergency Medicine | Admitting: Emergency Medicine

## 2016-10-16 DIAGNOSIS — M7989 Other specified soft tissue disorders: Secondary | ICD-10-CM | POA: Diagnosis not present

## 2016-10-16 DIAGNOSIS — M25421 Effusion, right elbow: Secondary | ICD-10-CM

## 2016-10-16 DIAGNOSIS — Z79899 Other long term (current) drug therapy: Secondary | ICD-10-CM | POA: Diagnosis not present

## 2016-10-16 DIAGNOSIS — R531 Weakness: Secondary | ICD-10-CM | POA: Diagnosis not present

## 2016-10-16 DIAGNOSIS — I10 Essential (primary) hypertension: Secondary | ICD-10-CM | POA: Diagnosis not present

## 2016-10-16 DIAGNOSIS — N3001 Acute cystitis with hematuria: Secondary | ICD-10-CM | POA: Diagnosis not present

## 2016-10-16 DIAGNOSIS — E119 Type 2 diabetes mellitus without complications: Secondary | ICD-10-CM | POA: Diagnosis not present

## 2016-10-16 DIAGNOSIS — Z8673 Personal history of transient ischemic attack (TIA), and cerebral infarction without residual deficits: Secondary | ICD-10-CM | POA: Diagnosis not present

## 2016-10-16 LAB — PROTIME-INR
INR: 1.04
Prothrombin Time: 13.5 seconds (ref 11.4–15.2)

## 2016-10-16 LAB — URINALYSIS, ROUTINE W REFLEX MICROSCOPIC
BILIRUBIN URINE: NEGATIVE
Glucose, UA: NEGATIVE mg/dL
KETONES UR: 20 mg/dL — AB
Nitrite: NEGATIVE
PH: 6 (ref 5.0–8.0)
PROTEIN: 100 mg/dL — AB
Specific Gravity, Urine: 1.02 (ref 1.005–1.030)

## 2016-10-16 LAB — I-STAT CHEM 8, ED
BUN: 14 mg/dL (ref 6–20)
CHLORIDE: 104 mmol/L (ref 101–111)
CREATININE: 0.6 mg/dL — AB (ref 0.61–1.24)
Calcium, Ion: 1.05 mmol/L — ABNORMAL LOW (ref 1.15–1.40)
GLUCOSE: 127 mg/dL — AB (ref 65–99)
HCT: 34 % — ABNORMAL LOW (ref 39.0–52.0)
HEMOGLOBIN: 11.6 g/dL — AB (ref 13.0–17.0)
POTASSIUM: 3.4 mmol/L — AB (ref 3.5–5.1)
Sodium: 137 mmol/L (ref 135–145)
TCO2: 23 mmol/L (ref 22–32)

## 2016-10-16 LAB — DIFFERENTIAL
BASOS ABS: 0 10*3/uL (ref 0.0–0.1)
Basophils Relative: 0 %
EOS PCT: 1 %
Eosinophils Absolute: 0.1 10*3/uL (ref 0.0–0.7)
LYMPHS ABS: 1.9 10*3/uL (ref 0.7–4.0)
LYMPHS PCT: 25 %
MONOS PCT: 9 %
Monocytes Absolute: 0.7 10*3/uL (ref 0.1–1.0)
NEUTROS ABS: 4.9 10*3/uL (ref 1.7–7.7)
NEUTROS PCT: 65 %

## 2016-10-16 LAB — CBC
HEMATOCRIT: 33.3 % — AB (ref 39.0–52.0)
Hemoglobin: 10.9 g/dL — ABNORMAL LOW (ref 13.0–17.0)
MCH: 26 pg (ref 26.0–34.0)
MCHC: 32.7 g/dL (ref 30.0–36.0)
MCV: 79.3 fL (ref 78.0–100.0)
Platelets: 283 10*3/uL (ref 150–400)
RBC: 4.2 MIL/uL — AB (ref 4.22–5.81)
RDW: 14.8 % (ref 11.5–15.5)
WBC: 7.6 10*3/uL (ref 4.0–10.5)

## 2016-10-16 LAB — RAPID URINE DRUG SCREEN, HOSP PERFORMED
AMPHETAMINES: NOT DETECTED
BENZODIAZEPINES: NOT DETECTED
Barbiturates: NOT DETECTED
COCAINE: NOT DETECTED
Opiates: NOT DETECTED
Tetrahydrocannabinol: NOT DETECTED

## 2016-10-16 LAB — I-STAT TROPONIN, ED: TROPONIN I, POC: 0.01 ng/mL (ref 0.00–0.08)

## 2016-10-16 LAB — COMPREHENSIVE METABOLIC PANEL
ALBUMIN: 3 g/dL — AB (ref 3.5–5.0)
ALK PHOS: 54 U/L (ref 38–126)
ALT: 23 U/L (ref 17–63)
ANION GAP: 9 (ref 5–15)
AST: 38 U/L (ref 15–41)
BILIRUBIN TOTAL: 0.8 mg/dL (ref 0.3–1.2)
BUN: 14 mg/dL (ref 6–20)
CO2: 24 mmol/L (ref 22–32)
Calcium: 9.1 mg/dL (ref 8.9–10.3)
Chloride: 102 mmol/L (ref 101–111)
Creatinine, Ser: 0.79 mg/dL (ref 0.61–1.24)
GFR calc Af Amer: >60 mL/min (ref >60)
GFR calc non Af Amer: >60 mL/min (ref >60)
GLUCOSE: 128 mg/dL — AB (ref 65–99)
Potassium: 3.4 mmol/L — ABNORMAL LOW (ref 3.5–5.1)
Sodium: 135 mmol/L (ref 135–145)
Total Protein: 6.5 g/dL (ref 6.5–8.1)

## 2016-10-16 LAB — APTT: APTT: 32 s (ref 24–36)

## 2016-10-16 LAB — ETHANOL: Alcohol, Ethyl (B): <10 mg/dL (ref <10)

## 2016-10-16 MED ORDER — CEPHALEXIN 500 MG PO CAPS: 500.0000 mg | ORAL_CAPSULE | Freq: Four times a day (QID) | ORAL | 0 refills | Status: DC

## 2016-10-16 MED ORDER — CEPHALEXIN 250 MG PO CAPS
500.0000 mg | ORAL_CAPSULE | Freq: Once | ORAL | Status: AC
  Administered 2016-10-16: 500 mg via ORAL
  Filled 2016-10-16: qty 2

## 2016-10-16 NOTE — ED Notes (Signed)
[facility] at bedside, leaving with d/c paperwork and phone number for family at home who will accept patient.

## 2016-10-16 NOTE — ED Notes (Signed)
Pt continues to be in MRI  

## 2016-10-16 NOTE — ED Notes (Signed)
Family at bedside. 

## 2016-10-16 NOTE — Addendum Note (Signed)
Addended by: Lianne Cure A on: 10/16/2016 03:12 PM   Modules accepted: Orders

## 2016-10-16 NOTE — Progress Notes (Signed)
**  Preliminary report by tech**  Right upper extremity venous duplex complete. There is no evidence of deep or superficial vein thrombosis involving the right upper extremity. All visualized vessels appear patent and compressible.   10/16/16 4:23 PM Curtis Brock RVT

## 2016-10-16 NOTE — ED Notes (Signed)
Patient transported to MRI.  Patient's daughter updated on POC

## 2016-10-16 NOTE — ED Triage Notes (Signed)
Pt arrives from [facility] from a doctors office where his daughter took him from home due to right hand swelling. Pt states for the last 2 days he has had pain and swelling in his right hand. Pt also has red Strach marks on his right upper arm and torso, pt also has red fluid pocket on right side. Pt has report wound on sacrum and right big toe. Pts daughter informed PCP that pt has been lending to the left for the last 4 days. Pt is alert and ox4.

## 2016-10-16 NOTE — ED Notes (Signed)
Discussed patient's home care with his son-in-law/caregiver, as well as his antibiotic prescription and reasons for follow up.  Will provide discharge instructions to PT[facility] upon pick-up.

## 2016-10-16 NOTE — ED Provider Notes (Signed)
[facility] DEPT Provider Note   CSN: [id number] Arrival date & time: 10/16/16  1127     History   Chief Complaint Chief Complaint  Patient presents with  . Joint Swelling  . Weakness    HPI Curtis Brock is a 83 y.o. male.  Patient is 83 year old male a history of diabetes, prior CVA, hypertension who presents with right arm swelling. Daughter states that she noticed the arm swelling for the last 3-4 days. She's not sure what happened. She does not believe that the patient has had any falls. He has been complaining that his shoulders been hurting. He denies any other injuries. He has a sacral decubitus wound that is being treated by wound care center and is improving per her report. They also feel like over the last week and half that the patient's been leaning more to the right side and are concerned that he may have had a stroke.They deny any fevers. No cough or other recent illnesses. No change in the patient's mental status. No vomiting. He is not able to write baseline but has been getting up out of bed to a chair as he does per normal routine.  He lives at home with family.      Past Medical History:  Diagnosis Date  . Blind left eye   . Diabetes mellitus without complication ([place])   . Hypertension     Patient Active Problem List   Diagnosis Date Noted  . Acute kidney injury ([place])   . Acute renal failure syndrome ([place])   . Sacral decubitus ulcer   . CVA (cerebral vascular accident) ([place])   . Pulmonary hypertension ([place])   . Metabolic acidosis   . Midline low back pain without sciatica   . Altered mental status   . Hypothermia   . Essential hypertension   . Hyperkalemia   . Diabetes type 2, controlled ([place])   . SIRS (systemic inflammatory response syndrome) ([place]) 05/01/2014  . Acute encephalopathy 05/01/2014  . Acute renal failure ([place]) 05/01/2014  . Fungal rash of trunk 05/01/2014  . Heel spur 05/04/2013  . Weakness 04/17/2013  . Anemia of other chronic  disease 06/10/2012  . Essential hypertension, benign 06/03/2012  . Type II or unspecified type diabetes mellitus with neurological manifestations, not stated as uncontrolled(250.60) 06/03/2012  . Abnormality of gait 05/28/2012  . Type I (juvenile type) diabetes mellitus with peripheral circulatory disorders, not stated as uncontrolled(250.71) 05/28/2012  . Unspecified hereditary and idiopathic peripheral neuropathy 05/28/2012  . Unspecified late effects of cerebrovascular disease 05/28/2012  . Weakness generalized 05/11/2012  . Blind left eye 12/11/2011  . Neuropathy 12/11/2011  . Elevated LFTs 11/12/2011  . Diabetes mellitus ([place]) 03/26/2011  . Hyperlipidemia 03/26/2011  . CVA (cerebral infarction) 03/26/2011  . Gait disorder 03/26/2011    Past Surgical History:  Procedure Laterality Date  . ABDOMINAL AORTOGRAM W/LOWER EXTREMITY N/A 10/02/2016   Procedure: ABDOMINAL AORTOGRAM W/LOWER EXTREMITY;  Surgeon: Waynetta Sandy, MD;  Location: [facility];  Service: Cardiovascular;  Laterality: N/A;  . PERIPHERAL VASCULAR BALLOON ANGIOPLASTY Left 10/02/2016   Procedure: PERIPHERAL VASCULAR BALLOON ANGIOPLASTY;  Surgeon: Waynetta Sandy, MD;  Location: [facility];  Service: Cardiovascular;  Laterality: Left;       Home Medications    Prior to Admission medications   Medication Sig Start Date End Date Taking? Authorizing Provider  acetaminophen (TYLENOL) 500 MG tablet Take 1,000 mg by mouth every 6 (six) hours as needed for mild pain or  headache.   Yes [provider]  brimonidine (ALPHAGAN) 0.2 % ophthalmic solution Place 1 drop into the right eye 2 (two) times daily. 03/28/14  Yes [provider]  clopidogrel (PLAVIX) 75 MG tablet Take 1 tablet (75 mg total) by mouth daily. 10/03/16  Yes Waynetta Sandy, MD  ferrous sulfate 325 (65 FE) MG tablet Take 325 mg by mouth daily with breakfast.   Yes [provider]  Multiple  Vitamin (MULTIVITAMIN WITH MINERALS) TABS tablet Take 1 tablet by mouth daily.   Yes [provider]  Omega-3 Fatty Acids (FISH OIL PO) Take 1 capsule by mouth daily.   Yes [provider]  timolol (TIMOPTIC) 0.5 % ophthalmic solution Place 1 drop into the right eye daily. 07/29/16  Yes [provider]  cephALEXin (KEFLEX) 500 MG capsule Take 1 capsule (500 mg total) by mouth 4 (four) times daily. 10/16/16   [name], MD    Family History No family history on file.  Social History Social History  Substance Use Topics  . Smoking status: Never Smoker  . Smokeless tobacco: Never Used  . Alcohol use No     Allergies   Patient has no known allergies.   Review of Systems Review of Systems  Constitutional: Negative for chills, diaphoresis, fatigue and fever.  HENT: Negative for congestion, rhinorrhea and sneezing.   Eyes: Negative.   Respiratory: Negative for cough, chest tightness and shortness of breath.   Cardiovascular: Negative for chest pain and leg swelling.  Gastrointestinal: Negative for abdominal pain, blood in stool, diarrhea, nausea and vomiting.  Genitourinary: Negative for difficulty urinating, flank pain, frequency and hematuria.  Musculoskeletal: Positive for arthralgias and joint swelling. Negative for back pain.  Skin: Negative for rash.  Neurological: Positive for weakness. Negative for dizziness, speech difficulty, numbness and headaches.     Physical Exam Updated Vital Signs BP 128/72   Pulse 73   Temp 97.6 F (36.4 C) (Oral)   Resp 15   SpO2 100%   Physical Exam  Constitutional: He appears well-developed and well-nourished.  HENT:  Head: Normocephalic and atraumatic.  Eyes: Pupils are equal, round, and reactive to light.  Neck: Normal range of motion. Neck supple.  Cardiovascular: Normal rate, regular rhythm and normal heart sounds.   Pulmonary/Chest: Effort normal and breath sounds normal. No respiratory distress. He  has no wheezes. He has no rales. He exhibits no tenderness.  Abdominal: Soft. Bowel sounds are normal. There is no tenderness. There is no rebound and no guarding.  Musculoskeletal: Normal range of motion. He exhibits no edema.  Patient has an area of ecchymosis to the anterior portion of his right shoulder. There is tenderness on palpation of the shoulder. There is some superficial abrasions to the upper arm. There some slight tenderness to the elbow as well. There some mild diffuse swelling to the right arm. There is no warmth or erythema. Radial pulses are intact.  Lymphadenopathy:    He has no cervical adenopathy.  Neurological: He is alert.  Patient has some slight weakness in the right upper extremity and right lower extremity as compared to left. There is no a or drooping. No slurred speech.  Skin: Skin is warm and dry. No rash noted.  Psychiatric: He has a normal mood and affect.     ED Treatments / Results  Labs (all labs ordered are listed, but only abnormal results are displayed) Labs Reviewed  CBC - Abnormal; Notable for the following:  Result Value   RBC 4.20 (*)    Hemoglobin 10.9 (*)    HCT 33.3 (*)    All other components within normal limits  COMPREHENSIVE METABOLIC PANEL - Abnormal; Notable for the following:    Potassium 3.4 (*)    Glucose, Bld 128 (*)    Albumin 3.0 (*)    All other components within normal limits  URINALYSIS, ROUTINE W REFLEX MICROSCOPIC - Abnormal; Notable for the following:    Color, Urine AMBER (*)    APPearance TURBID (*)    Hgb urine dipstick SMALL (*)    Ketones, ur 20 (*)    Protein, ur 100 (*)    Leukocytes, UA LARGE (*)    Bacteria, UA MANY (*)    Squamous Epithelial / LPF 0-5 (*)    Non Squamous Epithelial 0-5 (*)    All other components within normal limits  I-STAT CHEM 8, ED - Abnormal; Notable for the following:    Potassium 3.4 (*)    Creatinine, Ser 0.60 (*)    Glucose, Bld 127 (*)    Calcium, Ion 1.05 (*)     Hemoglobin 11.6 (*)    HCT 34.0 (*)    All other components within normal limits  URINE CULTURE  ETHANOL  PROTIME-INR  APTT  DIFFERENTIAL  RAPID URINE DRUG SCREEN, HOSP PERFORMED  I-STAT TROPONIN, ED    EKG  EKG Interpretation  Date/Time:  Wednesday October 16 2016 11:29:51 EDT Ventricular Rate:  83 PR Interval:    QRS Duration: 93 QT Interval:  399 QTC Calculation: 469 R Axis:   -14 Text Interpretation:  Sinus rhythm Low voltage, precordial leads since last tracing no significant change Confirmed by Malvin Johns [id number]) on 10/16/2016 11:36:11 AM       Radiology Dg Shoulder Right  Result Date: 10/16/2016 CLINICAL DATA:  Right arm pain and swelling. EXAM: RIGHT SHOULDER - 2+ VIEW COMPARISON:  Right shoulder x-rays dated July 19, 2005. FINDINGS: No acute fracture or malalignment. Moderate acromioclavicular and glenohumeral degenerative changes. Osteopenia. IMPRESSION: Moderate glenohumeral and acromioclavicular degenerative changes. No acute osseous abnormality. Electronically Signed   By: Titus Dubin M.D.   On: 10/16/2016 14:24   Dg Elbow Complete Right (3+view)  Result Date: 10/16/2016 CLINICAL DATA:  Right arm pain/swelling EXAM: RIGHT ELBOW - COMPLETE 3+ VIEW COMPARISON:  None. FINDINGS: No fracture or dislocation is seen. Possible elbow joint effusion, although equivocal. Mild degenerative changes of the elbow. IMPRESSION: Possible elbow joint effusion, equivocal. If there is a history of trauma, this appearance would be worrisome for occult radial head fracture, although a definite fracture is not visualized. Electronically Signed   By: Julian Hy M.D.   On: 10/16/2016 14:26   Dg Forearm Right  Result Date: 10/16/2016 CLINICAL DATA:  Right arm pain/swelling EXAM: RIGHT FOREARM - 2 VIEW COMPARISON:  None. FINDINGS: No fracture or dislocation is seen. Mild degenerative changes of the radiocarpal joint. Possible elbow joint effusion. IMPRESSION: No fracture or  dislocation is seen. Electronically Signed   By: Julian Hy M.D.   On: 10/16/2016 14:24   Ct Head Wo Contrast  Result Date: 10/16/2016 CLINICAL DATA:  Focal neural deficit. EXAM: CT HEAD WITHOUT CONTRAST TECHNIQUE: Contiguous axial images were obtained from the base of the skull through the vertex without intravenous contrast. COMPARISON:  CT scan of May 01, 2014. FINDINGS: Brain: Mild diffuse cortical atrophy is noted. Mild chronic ischemic white matter disease is noted. No mass effect or midline shift is noted. Ventricular  size is within normal limits. There is no evidence of mass lesion, hemorrhage or acute infarction. Vascular: No hyperdense vessel or unexpected calcification. Skull: Normal. Negative for fracture or focal lesion. Sinuses/Orbits: No acute finding. Other: None. IMPRESSION: Mild diffuse cortical atrophy. Mild chronic ischemic white matter disease. No acute intracranial abnormality seen. Electronically Signed   By: Marijo Conception, M.D.   On: 10/16/2016 14:31   Mr Brain Wo Contrast  Result Date: 10/16/2016 CLINICAL DATA:  Right arm swelling.  Gait abnormality. EXAM: MRI HEAD WITHOUT CONTRAST TECHNIQUE: Multiplanar, multiecho pulse sequences of the brain and surrounding structures were obtained without intravenous contrast. COMPARISON:  Head CT 10/16/2016 Brain MRI 05/13/2014 FINDINGS: Brain: The midline structures are normal. There is no focal diffusion restriction to indicate acute infarct. There is beginning confluent hyperintense T2-weighted signal within the periventricular and deep white matter, most often seen in the setting of chronic microvascular ischemia. No intraparenchymal hematoma or chronic microhemorrhage. Advanced global atrophy without lobar predilection. The dura is normal and there is no extra-axial collection. Vascular: Major intracranial arterial and venous sinus flow voids are preserved. Skull and upper cervical spine: The visualized skull base, calvarium,  upper cervical spine and extracranial soft tissues are normal. Sinuses/Orbits: No fluid levels or advanced mucosal thickening. No mastoid or middle ear effusion. Normal orbits. IMPRESSION: Atrophy and sequelae of chronic ischemic microangiopathy without acute intracranial abnormality. Electronically Signed   By: Ulyses Jarred M.D.   On: 10/16/2016 19:27    Procedures Procedures (including critical care time)  Medications Ordered in ED Medications  cephALEXin (KEFLEX) capsule 500 mg (500 mg Oral Given 10/16/16 2053)     Initial Impression / Assessment and Plan / ED Course  I have reviewed the triage vital signs and the nursing notes.  Pertinent labs & imaging results that were available during my care of the patient were reviewed by me and considered in my medical decision making (see chart for details).     Patient presents with right arm swelling. There is no evidence of underlying fracture. There is no evidence of cellulitis.  DVT study is pending. There is also question of some right-sided weakness. Patient is conversing normally. There some subtle weakness in the right extremities as compared to left. CT head was negative for acute grand mal eyes. MR pending.  If MR and DVT study normal, pt can likely be discharged.  Is otherwise at baseline per family.  Would recommend an arm sling and f/u with PCP.  There is a ?elbow effusion, but pain in arm is more localized in shoulder.    Dr. Gilford Raid to take over care. Final Clinical Impressions(s) / ED Diagnoses   Final diagnoses:  Swelling of joint of upper arm, right  Weakness  Acute cystitis with hematuria    New Prescriptions Discharge Medication List as of 10/16/2016  8:11 PM    START taking these medications   Details  cephALEXin (KEFLEX) 500 MG capsule Take 1 capsule (500 mg total) by mouth 4 (four) times daily., Starting Wed 10/16/2016, Print         Malvin Johns, MD 10/17/16 [phone]

## 2016-10-16 NOTE — ED Provider Notes (Signed)
Pt signed out by Dr. Tamera Punt awaiting [place] and MRI and UA.  UA shows UTI  Right upper extremity venous duplex complete. There is no evidence of deep or superficial vein thrombosis involving the right upper extremity. All visualized vessels appear patent and compressible.   10/16/16 4:23 PM Curtis Brock RVT   MRI:  IMPRESSION:  Atrophy and sequelae of chronic ischemic microangiopathy without  acute intracranial abnormality.   Pt placed in a sling.  1st dose of keflex given in ED.  Urine sent for culture.  He is stable for d/c.   [name], MD 10/16/16 2013

## 2016-10-16 NOTE — ED Notes (Signed)
Patient transported to MRI 

## 2016-10-16 NOTE — ED Notes (Signed)
[place] - son-in-law - CALL IF PATIENT GETS DISCHARGED

## 2016-10-19 LAB — URINE CULTURE: Culture: >=100000 — AB

## 2016-10-20 ENCOUNTER — Telehealth: Payer: Self-pay

## 2016-10-20 NOTE — Telephone Encounter (Signed)
Post ED Visit - Positive Culture Follow-up  Culture report reviewed by antimicrobial stewardship pharmacist:  []  Elenor Quinones, Pharm.D. []  Heide Guile, Pharm.D., BCPS AQ-ID []  Parks Neptune, Pharm.D., BCPS []  Alycia Rossetti, Pharm.D., BCPS []  [place], Pharm.D., BCPS, AAHIVP []  Legrand Como, Pharm.D., BCPS, AAHIVP []  Salome Arnt, PharmD, BCPS []  Dimitri Ped, PharmD, BCPS []  Vincenza Hews, PharmD, BCPS Jimmy Footman Pharm D Positive urine culture Treated with Cephalexin, organism sensitive to the same and no further patient follow-up is required at this time.  Genia Del 10/20/2016, 9:23 AM

## 2016-10-23 ENCOUNTER — Other Ambulatory Visit (HOSPITAL_COMMUNITY): Payer: Self-pay | Admitting: Family Medicine

## 2016-10-23 DIAGNOSIS — R1310 Dysphagia, unspecified: Secondary | ICD-10-CM

## 2016-10-29 ENCOUNTER — Ambulatory Visit (INDEPENDENT_AMBULATORY_CARE_PROVIDER_SITE_OTHER): Admit: 2016-10-29 | Discharge: 2016-10-29 | Disposition: A | Discharge status: Home / Self Care | Payer: Medicare Other

## 2016-10-29 ENCOUNTER — Ambulatory Visit (HOSPITAL_COMMUNITY)
Admission: RE | Admit: 2016-10-29 | Discharge: 2016-10-29 | Disposition: A | Discharge status: Home / Self Care | Payer: Medicare Other | Source: Ambulatory Visit | Attending: Vascular Surgery | Admitting: Vascular Surgery

## 2016-10-29 DIAGNOSIS — R9389 Abnormal findings on diagnostic imaging of other specified body structures: Secondary | ICD-10-CM | POA: Diagnosis not present

## 2016-10-29 DIAGNOSIS — I739 Peripheral vascular disease, unspecified: Secondary | ICD-10-CM

## 2016-10-29 DIAGNOSIS — E1151 Type 2 diabetes mellitus with diabetic peripheral angiopathy without gangrene: Secondary | ICD-10-CM | POA: Insufficient documentation

## 2016-10-29 LAB — VAS US LOWER EXTREMITY ARTERIAL DUPLEX
RIGHT ANT DIST TIBAL SYS PSV: 142 cm/s
RIGHT POST TIB DIST SYS: 53 cm/s
RPERPSV: 15 cm/s
RSFDPSV: -60 cm/s
Right super femoral mid sys PSV: -69 cm/s
Right super femoral prox sys PSV: 86 cm/s

## 2016-10-30 ENCOUNTER — Ambulatory Visit (HOSPITAL_COMMUNITY)
Admission: RE | Admit: 2016-10-30 | Discharge: 2016-10-30 | Disposition: A | Discharge status: Home / Self Care | Payer: Medicare Other | Source: Ambulatory Visit | Attending: Family Medicine | Admitting: Family Medicine

## 2016-10-30 DIAGNOSIS — I10 Essential (primary) hypertension: Secondary | ICD-10-CM | POA: Insufficient documentation

## 2016-10-30 DIAGNOSIS — E119 Type 2 diabetes mellitus without complications: Secondary | ICD-10-CM | POA: Insufficient documentation

## 2016-10-30 DIAGNOSIS — E1151 Type 2 diabetes mellitus with diabetic peripheral angiopathy without gangrene: Secondary | ICD-10-CM | POA: Diagnosis not present

## 2016-10-30 DIAGNOSIS — H5462 Unqualified visual loss, left eye, normal vision right eye: Secondary | ICD-10-CM | POA: Insufficient documentation

## 2016-10-30 DIAGNOSIS — R1310 Dysphagia, unspecified: Secondary | ICD-10-CM | POA: Insufficient documentation

## 2016-10-30 DIAGNOSIS — R05 Cough: Secondary | ICD-10-CM | POA: Diagnosis not present

## 2016-10-30 NOTE — Progress Notes (Signed)
Modified Barium Swallow Progress Note  Patient Details  Name: Curtis Brock MRN: [id number] Date of Birth: 24-Sep-1925  Today's Date: 10/30/2016  Modified Barium Swallow completed.  Full report located under Chart Review in the Imaging Section.  Brief recommendations include the following:  Clinical Impression  Pt has a mild oropharyngeal dysphagia with suspected primary esophageal deficits. He has slow orally transit and piecemeal swallowing with thin liquids. He clears most boluses from his oral cavity well, although he did have mild residue from soft solids and SLP had to manually remove the barium tablet, which he did not clear despite cueing and use of liquid (thin and nectar) and puree washes. Pt's pharyngeal swallow triggers in a timely manner at the level of the valleculae with no aspiration or penetration observed even with challenging. He has mild-moderate vallecular residue with solid consistencies, and intermittent vallecular residue from thin liquids as well. Upon esophageal sweep there appeared to be an outpouching in the more distal esophagus (see MD note). Coughing was observed after PO intake, when the pharynx had already been cleared, and continued even as the pt was exiting radiology. Given the above, recommend up to a soft solid diet and thin liquids, with assistance from family/caregivers in monitoring for small bites/sips and full oral clearance between bites. Would utilize general aspiration and esophageal precautions (handout given to daughter). Given the above as well as the results from barium swallow earlier this year (stricture versus dysmotility), suspect that his symptoms may be largely esophageal in nature. Will defer diet advancement to primary SLP.   Swallow Evaluation Recommendations   Recommended Consults: Consider GI evaluation   SLP Diet Recommendations: Dysphagia 2 (Fine chop) solids;Dysphagia 3 (Mech soft) solids;Thin liquid   Liquid Administration via:  Straw;Cup   Medication Administration: Crushed with puree   Supervision: Staff to assist with self feeding;Full supervision/cueing for compensatory strategies   Compensations: Slow rate;Small sips/bites;Minimize environmental distractions;Follow solids with liquid   Postural Changes: Remain semi-upright after after feeds/meals (Comment);Seated upright at 90 degrees   Oral Care Recommendations: Oral care BID        Germain Osgood 10/30/2016,2:40 PM   Germain Osgood, M.A. CCC-SLP [phone]

## 2016-10-31 ENCOUNTER — Other Ambulatory Visit: Payer: Self-pay | Admitting: Vascular Surgery

## 2016-11-01 ENCOUNTER — Encounter: Payer: Self-pay | Admitting: Vascular Surgery

## 2016-11-01 ENCOUNTER — Ambulatory Visit (INDEPENDENT_AMBULATORY_CARE_PROVIDER_SITE_OTHER): Payer: Medicare Other | Admitting: Vascular Surgery

## 2016-11-01 VITALS — BP 118/72 | HR 72 | Temp 97.0°F | Resp 16 | Ht 66.0 in | Wt 170.0 lb

## 2016-11-01 DIAGNOSIS — I739 Peripheral vascular disease, unspecified: Secondary | ICD-10-CM | POA: Diagnosis not present

## 2016-11-01 NOTE — Progress Notes (Signed)
Patient ID: Curtis Brock, male   DOB: [birth year], 83 y.o.   MRN: [id number]  Reason for Consult: Follow-up (4-6 week FU with ABIs and LE Art L )   Referred by Pa, [facility]*  Subjective:     HPI:  Curtis Brock is a 83 y.o. male with history of left great toe ulceration referred by Dr. Dellia Nims in the wound clinic.He underwent angiographic leakage was able to his PT and AT arteries a few weeks back.ince that time he has mostly healed his left great toe ulceration. Family states that he is not walking as well as he previously was is also been seen in the hospital for right upper extremity as well as right lower extremity swelling. He is also having some bruising of his groins from his taking Plavix. He'll not having fevers chills or erythema.  Past Medical History:  Diagnosis Date  . Blind left eye   . Diabetes mellitus without complication ([place])   . Hypertension    History reviewed. No pertinent family history. Past Surgical History:  Procedure Laterality Date  . ABDOMINAL AORTOGRAM W/LOWER EXTREMITY N/A 10/02/2016   Procedure: ABDOMINAL AORTOGRAM W/LOWER EXTREMITY;  Surgeon: Waynetta Sandy, MD;  Location: [facility];  Service: Cardiovascular;  Laterality: N/A;  . PERIPHERAL VASCULAR BALLOON ANGIOPLASTY Left 10/02/2016   Procedure: PERIPHERAL VASCULAR BALLOON ANGIOPLASTY;  Surgeon: Waynetta Sandy, MD;  Location: [facility];  Service: Cardiovascular;  Laterality: Left;    Short Social History:  Social History  Substance Use Topics  . Smoking status: Never Smoker  . Smokeless tobacco: Never Used  . Alcohol use No    Not on File  Current Outpatient Prescriptions  Medication Sig Dispense Refill  . acetaminophen (TYLENOL) 500 MG tablet Take 1,000 mg by mouth every 6 (six) hours as needed for mild pain or headache.    . brimonidine (ALPHAGAN) 0.2 % ophthalmic solution Place 1 drop into the right eye 2 (two) times daily.    . cephALEXin  (KEFLEX) 500 MG capsule Take 1 capsule (500 mg total) by mouth 4 (four) times daily. 28 capsule 0  . clopidogrel (PLAVIX) 75 MG tablet Take 1 tablet (75 mg total) by mouth daily. 30 tablet 0  . ferrous sulfate 325 (65 FE) MG tablet Take 325 mg by mouth daily with breakfast.    . Multiple Vitamin (MULTIVITAMIN WITH MINERALS) TABS tablet Take 1 tablet by mouth daily.    . Omega-3 Fatty Acids (FISH OIL PO) Take 1 capsule by mouth daily.    . timolol (TIMOPTIC) 0.5 % ophthalmic solution Place 1 drop into the right eye daily.    . clopidogrel (PLAVIX) 75 MG tablet TAKE 1 TABLET BY MOUTH  DAILY (Patient not taking: Reported on 11/01/2016) 90 tablet 3   No current facility-administered medications for this visit.     Review of Systems  Constitutional:  Constitutional negative. HENT: HENT negative.  Eyes: Eyes negative.  Respiratory: Respiratory negative.  Cardiovascular: Cardiovascular negative.  GI: Gastrointestinal negative.  Musculoskeletal: Musculoskeletal negative.  Skin: Skin negative.  Neurological: Neurological negative. Hematologic: Hematologic/lymphatic negative.  Psychiatric: Psychiatric negative.        Objective:  Objective   Vitals:   11/01/16 1005  BP: 118/72  Pulse: 72  Resp: 16  Temp: (!) 97 F (36.1 C)  TempSrc: Oral  SpO2: 98%  Weight: 170 lb (77.1 kg)  Height: 5\' 6"  (1.676 m)   Body mass index is 27.44 kg/m.  Physical  Exam  Constitutional: He appears well-developed.  HENT:  Head: Normocephalic.  Eyes:  glasses  Neck: Normal range of motion.  Cardiovascular: Normal rate.   Pulses:      Femoral pulses are 2+ on the right side, and 2+ on the left side.      Dorsalis pedis pulses are 2+ on the left side.  Pulmonary/Chest: Effort normal.  Abdominal: Soft.  Musculoskeletal: He exhibits no edema.  Neurological: He is alert.  Skin: Skin is warm and dry.  Psychiatric: He has a normal mood and affect. His behavior is normal. Judgment normal.     Data: ABI on the left is now 1.08 relative 0.6. Preintervention. Duplex demonstrates no evidence of stenosis in the left lower extremity.     Assessment/Plan:     83 year old male status post left lower extremity endovascular revascularization with plain balloon edge plasty. He remains on Plavix and we will send this out to 3 months. His wound is healing well and I have asked him to return to see Dr. Dellia Nims at least one more time. We will see him in 6 months with repeat ABIs unless he has further issues we can see him sooner.     Waynetta Sandy MD [facility]

## 2016-11-18 ENCOUNTER — Encounter (HOSPITAL_BASED_OUTPATIENT_CLINIC_OR_DEPARTMENT_OTHER): Payer: Medicare Other | Attending: Internal Medicine

## 2016-11-18 DIAGNOSIS — E1140 Type 2 diabetes mellitus with diabetic neuropathy, unspecified: Secondary | ICD-10-CM | POA: Diagnosis not present

## 2016-11-18 DIAGNOSIS — E11621 Type 2 diabetes mellitus with foot ulcer: Secondary | ICD-10-CM | POA: Insufficient documentation

## 2016-11-18 DIAGNOSIS — I10 Essential (primary) hypertension: Secondary | ICD-10-CM | POA: Diagnosis not present

## 2016-11-18 DIAGNOSIS — L97522 Non-pressure chronic ulcer of other part of left foot with fat layer exposed: Secondary | ICD-10-CM | POA: Insufficient documentation

## 2016-11-18 DIAGNOSIS — L89153 Pressure ulcer of sacral region, stage 3: Secondary | ICD-10-CM | POA: Insufficient documentation

## 2016-11-19 NOTE — Addendum Note (Signed)
Addended by: Lianne Cure A on: 11/19/2016 03:54 PM   Modules accepted: Orders

## 2016-12-02 DIAGNOSIS — L89153 Pressure ulcer of sacral region, stage 3: Secondary | ICD-10-CM | POA: Diagnosis not present

## 2016-12-16 ENCOUNTER — Encounter (HOSPITAL_BASED_OUTPATIENT_CLINIC_OR_DEPARTMENT_OTHER): Payer: Medicare Other | Attending: Internal Medicine

## 2016-12-16 DIAGNOSIS — E11621 Type 2 diabetes mellitus with foot ulcer: Secondary | ICD-10-CM | POA: Diagnosis not present

## 2016-12-16 DIAGNOSIS — L97521 Non-pressure chronic ulcer of other part of left foot limited to breakdown of skin: Secondary | ICD-10-CM | POA: Diagnosis not present

## 2016-12-16 DIAGNOSIS — L89153 Pressure ulcer of sacral region, stage 3: Secondary | ICD-10-CM | POA: Diagnosis not present

## 2017-01-20 ENCOUNTER — Encounter (HOSPITAL_BASED_OUTPATIENT_CLINIC_OR_DEPARTMENT_OTHER): Payer: Medicare Other | Attending: Internal Medicine

## 2017-01-20 DIAGNOSIS — L97521 Non-pressure chronic ulcer of other part of left foot limited to breakdown of skin: Secondary | ICD-10-CM | POA: Insufficient documentation

## 2017-01-20 DIAGNOSIS — I10 Essential (primary) hypertension: Secondary | ICD-10-CM | POA: Diagnosis not present

## 2017-01-20 DIAGNOSIS — E11621 Type 2 diabetes mellitus with foot ulcer: Secondary | ICD-10-CM | POA: Diagnosis not present

## 2017-01-20 DIAGNOSIS — E1140 Type 2 diabetes mellitus with diabetic neuropathy, unspecified: Secondary | ICD-10-CM | POA: Diagnosis not present

## 2017-01-20 DIAGNOSIS — L89153 Pressure ulcer of sacral region, stage 3: Secondary | ICD-10-CM | POA: Diagnosis not present

## 2017-02-24 LAB — HEMOGLOBIN A1C: HEMOGLOBIN A1C: 5.8

## 2017-02-24 LAB — TSH: TSH: 2.81 (ref 0.41–5.90)

## 2017-02-24 LAB — HEPATIC FUNCTION PANEL
ALT: 9 — AB (ref 10–40)
AST: 14 (ref 14–40)
Alkaline Phosphatase: 59 (ref 25–125)
Bilirubin, Total: 0.5

## 2017-02-24 LAB — LIPID PANEL
Cholesterol: 177 (ref 0–200)
HDL: 133 — AB (ref 35–70)
LDL CALC: 120
LDl/HDL Ratio: 4
TRIGLYCERIDES: 63 (ref 40–160)

## 2017-02-24 LAB — CBC AND DIFFERENTIAL
HCT: 36 — AB (ref 41–53)
HEMOGLOBIN: 11.6 — AB (ref 13.5–17.5)
Neutrophils Absolute: 3
Platelets: 249 (ref 150–399)
WBC: 4.6

## 2017-02-24 LAB — BASIC METABOLIC PANEL
BUN: 11 (ref 4–21)
CREATININE: 0.8 (ref 0.6–1.3)
GLUCOSE: 98
Potassium: 4 (ref 3.4–5.3)
Sodium: 140 (ref 137–147)

## 2017-03-17 ENCOUNTER — Encounter (HOSPITAL_BASED_OUTPATIENT_CLINIC_OR_DEPARTMENT_OTHER): Payer: Medicare Other | Attending: Internal Medicine

## 2017-03-17 DIAGNOSIS — L97521 Non-pressure chronic ulcer of other part of left foot limited to breakdown of skin: Secondary | ICD-10-CM | POA: Diagnosis not present

## 2017-03-17 DIAGNOSIS — E11621 Type 2 diabetes mellitus with foot ulcer: Secondary | ICD-10-CM | POA: Insufficient documentation

## 2017-03-17 DIAGNOSIS — E1140 Type 2 diabetes mellitus with diabetic neuropathy, unspecified: Secondary | ICD-10-CM | POA: Insufficient documentation

## 2017-03-17 DIAGNOSIS — I120 Hypertensive chronic kidney disease with stage 5 chronic kidney disease or end stage renal disease: Secondary | ICD-10-CM | POA: Insufficient documentation

## 2017-03-17 DIAGNOSIS — E1122 Type 2 diabetes mellitus with diabetic chronic kidney disease: Secondary | ICD-10-CM | POA: Insufficient documentation

## 2017-03-17 DIAGNOSIS — Z8673 Personal history of transient ischemic attack (TIA), and cerebral infarction without residual deficits: Secondary | ICD-10-CM | POA: Diagnosis not present

## 2017-03-17 DIAGNOSIS — N186 End stage renal disease: Secondary | ICD-10-CM | POA: Insufficient documentation

## 2017-03-17 DIAGNOSIS — L89153 Pressure ulcer of sacral region, stage 3: Secondary | ICD-10-CM | POA: Insufficient documentation

## 2017-03-31 DIAGNOSIS — E11621 Type 2 diabetes mellitus with foot ulcer: Secondary | ICD-10-CM | POA: Diagnosis not present

## 2017-04-14 ENCOUNTER — Encounter (HOSPITAL_BASED_OUTPATIENT_CLINIC_OR_DEPARTMENT_OTHER): Payer: Medicare Other | Attending: Internal Medicine

## 2017-04-14 DIAGNOSIS — E1122 Type 2 diabetes mellitus with diabetic chronic kidney disease: Secondary | ICD-10-CM | POA: Insufficient documentation

## 2017-04-14 DIAGNOSIS — L89153 Pressure ulcer of sacral region, stage 3: Secondary | ICD-10-CM | POA: Diagnosis not present

## 2017-04-14 DIAGNOSIS — R54 Age-related physical debility: Secondary | ICD-10-CM | POA: Diagnosis not present

## 2017-04-14 DIAGNOSIS — I120 Hypertensive chronic kidney disease with stage 5 chronic kidney disease or end stage renal disease: Secondary | ICD-10-CM | POA: Insufficient documentation

## 2017-04-14 DIAGNOSIS — E11622 Type 2 diabetes mellitus with other skin ulcer: Secondary | ICD-10-CM | POA: Insufficient documentation

## 2017-04-14 DIAGNOSIS — E1140 Type 2 diabetes mellitus with diabetic neuropathy, unspecified: Secondary | ICD-10-CM | POA: Insufficient documentation

## 2017-04-14 DIAGNOSIS — N186 End stage renal disease: Secondary | ICD-10-CM | POA: Diagnosis not present

## 2017-04-28 DIAGNOSIS — E11622 Type 2 diabetes mellitus with other skin ulcer: Secondary | ICD-10-CM | POA: Diagnosis not present

## 2017-05-02 ENCOUNTER — Ambulatory Visit: Payer: Medicare Other | Admitting: Vascular Surgery

## 2017-05-02 ENCOUNTER — Encounter (HOSPITAL_COMMUNITY): Payer: Medicare Other

## 2017-05-12 DIAGNOSIS — E11622 Type 2 diabetes mellitus with other skin ulcer: Secondary | ICD-10-CM | POA: Diagnosis not present

## 2017-05-16 ENCOUNTER — Encounter (HOSPITAL_COMMUNITY): Payer: Medicare Other

## 2017-05-16 ENCOUNTER — Ambulatory Visit: Payer: Medicare Other | Admitting: Vascular Surgery

## 2017-05-19 ENCOUNTER — Encounter: Payer: Self-pay | Admitting: Internal Medicine

## 2017-05-19 NOTE — Progress Notes (Signed)
    This encounter was created in error - please disregard.  Patient was respite, came in on Friday 5/3 and left 05/19/17 before he could be seen by provider.

## 2017-05-26 ENCOUNTER — Encounter (HOSPITAL_BASED_OUTPATIENT_CLINIC_OR_DEPARTMENT_OTHER): Payer: Medicare Other | Attending: Internal Medicine

## 2017-05-26 DIAGNOSIS — R54 Age-related physical debility: Secondary | ICD-10-CM | POA: Diagnosis not present

## 2017-05-26 DIAGNOSIS — L89153 Pressure ulcer of sacral region, stage 3: Secondary | ICD-10-CM | POA: Diagnosis not present

## 2017-05-26 DIAGNOSIS — E1122 Type 2 diabetes mellitus with diabetic chronic kidney disease: Secondary | ICD-10-CM | POA: Diagnosis not present

## 2017-05-26 DIAGNOSIS — I120 Hypertensive chronic kidney disease with stage 5 chronic kidney disease or end stage renal disease: Secondary | ICD-10-CM | POA: Insufficient documentation

## 2017-05-26 DIAGNOSIS — N186 End stage renal disease: Secondary | ICD-10-CM | POA: Insufficient documentation

## 2017-05-26 DIAGNOSIS — E1140 Type 2 diabetes mellitus with diabetic neuropathy, unspecified: Secondary | ICD-10-CM | POA: Diagnosis not present

## 2017-06-13 ENCOUNTER — Ambulatory Visit (HOSPITAL_COMMUNITY)
Admission: RE | Admit: 2017-06-13 | Discharge: 2017-06-13 | Disposition: A | Discharge status: Home / Self Care | Payer: Medicare Other | Source: Ambulatory Visit | Attending: Vascular Surgery | Admitting: Vascular Surgery

## 2017-06-13 ENCOUNTER — Encounter: Payer: Self-pay | Admitting: Vascular Surgery

## 2017-06-13 ENCOUNTER — Ambulatory Visit: Payer: Medicare Other | Admitting: Vascular Surgery

## 2017-06-13 VITALS — BP 130/69 | HR 77 | Temp 97.1°F | Resp 18 | Ht 66.0 in | Wt 156.0 lb

## 2017-06-13 DIAGNOSIS — L89159 Pressure ulcer of sacral region, unspecified stage: Secondary | ICD-10-CM | POA: Diagnosis not present

## 2017-06-13 DIAGNOSIS — E1151 Type 2 diabetes mellitus with diabetic peripheral angiopathy without gangrene: Secondary | ICD-10-CM | POA: Diagnosis not present

## 2017-06-13 DIAGNOSIS — I739 Peripheral vascular disease, unspecified: Secondary | ICD-10-CM | POA: Diagnosis not present

## 2017-06-13 DIAGNOSIS — Z9862 Peripheral vascular angioplasty status: Secondary | ICD-10-CM | POA: Diagnosis not present

## 2017-06-13 DIAGNOSIS — I10 Essential (primary) hypertension: Secondary | ICD-10-CM | POA: Diagnosis not present

## 2017-06-13 NOTE — Progress Notes (Signed)
Patient ID: Curtis Brock, male   DOB: 02/18/1925, 84 y.o.   MRN: [id number]  Reason for Consult: Follow-up (6 month f/u )   Referred by Pa, [facility]*  Subjective:     HPI:  Curtis Brock is a 84 y.o. male has previously undergone balloon angioplasty of both his posterior tibial and anterior tibial arteries back in September.  I last follow-up visit his ABI was up to 1 on the left side and he has healed his wound.  He is no longer seeing Dr. Quentin Cornwall for his left foot although he is seeing him for a sacral decubitus ulcer at this time.  States that his feet feel fine no longer has ulceration.  He now takes aspirin no longer on Plavix.  Follows up today with ABIs of his left lower extremity.  Past Medical History:  Diagnosis Date  . Blind left eye    No family history on file. Past Surgical History:  Procedure Laterality Date  . ABDOMINAL AORTOGRAM W/LOWER EXTREMITY N/A 10/02/2016   Procedure: ABDOMINAL AORTOGRAM W/LOWER EXTREMITY;  Surgeon: Waynetta Sandy, MD;  Location: [facility];  Service: Cardiovascular;  Laterality: N/A;  . PERIPHERAL VASCULAR BALLOON ANGIOPLASTY Left 10/02/2016   Procedure: PERIPHERAL VASCULAR BALLOON ANGIOPLASTY;  Surgeon: Waynetta Sandy, MD;  Location: [facility];  Service: Cardiovascular;  Laterality: Left;    Short Social History:  Social History   Tobacco Use  . Smoking status: Never Smoker  . Smokeless tobacco: Never Used  Substance Use Topics  . Alcohol use: No    No Known Allergies  Current Outpatient Medications  Medication Sig Dispense Refill  . aspirin 81 MG chewable tablet Chew 81 mg by mouth daily.    . ferrous sulfate 325 (65 FE) MG tablet Take 325 mg by mouth daily with breakfast.    . Multiple Vitamin (MULTIVITAMIN WITH MINERALS) TABS tablet Take 1 tablet by mouth daily.    . timolol (TIMOPTIC) 0.5 % ophthalmic solution Place 1 drop into both eyes daily.      No current  facility-administered medications for this visit.     Review of Systems  Constitutional:  Constitutional negative. HENT: HENT negative.  Eyes: Eyes negative.  Respiratory: Respiratory negative.  Cardiovascular: Cardiovascular negative.  GI: Gastrointestinal negative.  Musculoskeletal: Musculoskeletal negative.  Skin: Positive for wound.  Neurological: Neurological negative. Hematologic: Hematologic/lymphatic negative.  Psychiatric: Psychiatric negative.        Objective:  Objective   Vitals:   06/13/17 1051  BP: 130/69  Pulse: 77  Resp: 18  Temp: (!) 97.1 F (36.2 C)  TempSrc: Oral  SpO2: 99%  Weight: 156 lb (70.8 kg)  Height: 5\' 6"  (1.676 m)   Body mass index is 25.18 kg/m.  Physical Exam  Constitutional: He is oriented to person, place, and time. He appears well-developed and well-nourished.  HENT:  Head: Normocephalic.  Eyes: Pupils are equal, round, and reactive to light.  Neck: Normal range of motion.  Cardiovascular: Normal rate.  Palpable femoral pulses bilaterally Strong left PT signal Weak dorsalis pedis signal on the left  Pulmonary/Chest: Effort normal.  Musculoskeletal: He exhibits no edema.  Well-healed left great toe ulceration  Neurological: He is alert and oriented to person, place, and time.  Skin: Skin is warm and dry.  Psychiatric: He has a normal mood and affect. His behavior is normal.    Data: I have independently interpreted his ABIs which are 0.81 right and 0.80 left.  These are mild arterial disease bilaterally.  On the left side is decreased prior to the previous study.     Assessment/Plan:     84 year old male with previous history of balloon angioplasty of both his posterior tibial and anterior tibial arteries for a wound that is now healed.  It appears that at least the anterior tibial artery repair has probably failed posterior tibial artery is probably failing with the decreased ABI on today's exam but he still has a strong  signal of the foot and the toes appear healthy.  With a heel wound and no further symptoms I would not pursue any intervention at this time.  He will continue aspirin.  We will follow him up in 1 year with repeat ABI certainly if he has issues before that we will see him sooner.     Waynetta Sandy MD [facility]

## 2017-06-16 ENCOUNTER — Encounter (HOSPITAL_BASED_OUTPATIENT_CLINIC_OR_DEPARTMENT_OTHER): Payer: Medicare Other | Attending: Internal Medicine

## 2017-06-16 DIAGNOSIS — L89153 Pressure ulcer of sacral region, stage 3: Secondary | ICD-10-CM | POA: Diagnosis present

## 2017-06-16 DIAGNOSIS — I120 Hypertensive chronic kidney disease with stage 5 chronic kidney disease or end stage renal disease: Secondary | ICD-10-CM | POA: Insufficient documentation

## 2017-06-16 DIAGNOSIS — N186 End stage renal disease: Secondary | ICD-10-CM | POA: Insufficient documentation

## 2017-06-16 DIAGNOSIS — E1122 Type 2 diabetes mellitus with diabetic chronic kidney disease: Secondary | ICD-10-CM | POA: Diagnosis not present

## 2017-06-16 DIAGNOSIS — E1140 Type 2 diabetes mellitus with diabetic neuropathy, unspecified: Secondary | ICD-10-CM | POA: Diagnosis not present

## 2017-06-16 DIAGNOSIS — R54 Age-related physical debility: Secondary | ICD-10-CM | POA: Insufficient documentation

## 2017-06-30 DIAGNOSIS — L89153 Pressure ulcer of sacral region, stage 3: Secondary | ICD-10-CM | POA: Diagnosis not present

## 2017-07-14 ENCOUNTER — Encounter (HOSPITAL_BASED_OUTPATIENT_CLINIC_OR_DEPARTMENT_OTHER): Payer: Medicare Other | Attending: Internal Medicine

## 2017-07-14 DIAGNOSIS — E1140 Type 2 diabetes mellitus with diabetic neuropathy, unspecified: Secondary | ICD-10-CM | POA: Diagnosis not present

## 2017-07-14 DIAGNOSIS — L89153 Pressure ulcer of sacral region, stage 3: Secondary | ICD-10-CM | POA: Insufficient documentation

## 2017-07-14 DIAGNOSIS — I120 Hypertensive chronic kidney disease with stage 5 chronic kidney disease or end stage renal disease: Secondary | ICD-10-CM | POA: Insufficient documentation

## 2017-07-14 DIAGNOSIS — E1122 Type 2 diabetes mellitus with diabetic chronic kidney disease: Secondary | ICD-10-CM | POA: Insufficient documentation

## 2017-07-14 DIAGNOSIS — N186 End stage renal disease: Secondary | ICD-10-CM | POA: Insufficient documentation

## 2017-08-25 ENCOUNTER — Encounter (HOSPITAL_BASED_OUTPATIENT_CLINIC_OR_DEPARTMENT_OTHER): Payer: Medicare Other | Attending: Internal Medicine

## 2017-08-25 DIAGNOSIS — I6930 Unspecified sequelae of cerebral infarction: Secondary | ICD-10-CM | POA: Insufficient documentation

## 2017-08-25 DIAGNOSIS — E11319 Type 2 diabetes mellitus with unspecified diabetic retinopathy without macular edema: Secondary | ICD-10-CM | POA: Diagnosis not present

## 2017-08-25 DIAGNOSIS — I120 Hypertensive chronic kidney disease with stage 5 chronic kidney disease or end stage renal disease: Secondary | ICD-10-CM | POA: Diagnosis not present

## 2017-08-25 DIAGNOSIS — E1122 Type 2 diabetes mellitus with diabetic chronic kidney disease: Secondary | ICD-10-CM | POA: Diagnosis not present

## 2017-08-25 DIAGNOSIS — N186 End stage renal disease: Secondary | ICD-10-CM | POA: Insufficient documentation

## 2017-08-25 DIAGNOSIS — H5462 Unqualified visual loss, left eye, normal vision right eye: Secondary | ICD-10-CM | POA: Insufficient documentation

## 2017-08-25 DIAGNOSIS — L89153 Pressure ulcer of sacral region, stage 3: Secondary | ICD-10-CM | POA: Diagnosis not present

## 2017-08-26 DIAGNOSIS — L97522 Non-pressure chronic ulcer of other part of left foot with fat layer exposed: Secondary | ICD-10-CM | POA: Diagnosis not present

## 2017-09-08 DIAGNOSIS — I6930 Unspecified sequelae of cerebral infarction: Secondary | ICD-10-CM | POA: Diagnosis not present

## 2017-09-08 DIAGNOSIS — N186 End stage renal disease: Secondary | ICD-10-CM | POA: Diagnosis not present

## 2017-09-08 DIAGNOSIS — E11319 Type 2 diabetes mellitus with unspecified diabetic retinopathy without macular edema: Secondary | ICD-10-CM | POA: Diagnosis not present

## 2017-09-08 DIAGNOSIS — L89153 Pressure ulcer of sacral region, stage 3: Secondary | ICD-10-CM | POA: Diagnosis not present

## 2017-09-08 DIAGNOSIS — H5462 Unqualified visual loss, left eye, normal vision right eye: Secondary | ICD-10-CM | POA: Diagnosis not present

## 2017-09-08 DIAGNOSIS — I120 Hypertensive chronic kidney disease with stage 5 chronic kidney disease or end stage renal disease: Secondary | ICD-10-CM | POA: Diagnosis not present

## 2017-09-08 DIAGNOSIS — E1122 Type 2 diabetes mellitus with diabetic chronic kidney disease: Secondary | ICD-10-CM | POA: Diagnosis not present

## 2017-10-06 ENCOUNTER — Encounter (HOSPITAL_BASED_OUTPATIENT_CLINIC_OR_DEPARTMENT_OTHER): Payer: Medicare Other | Attending: Internal Medicine

## 2017-10-06 DIAGNOSIS — E1122 Type 2 diabetes mellitus with diabetic chronic kidney disease: Secondary | ICD-10-CM | POA: Diagnosis not present

## 2017-10-06 DIAGNOSIS — E1140 Type 2 diabetes mellitus with diabetic neuropathy, unspecified: Secondary | ICD-10-CM | POA: Diagnosis not present

## 2017-10-06 DIAGNOSIS — I120 Hypertensive chronic kidney disease with stage 5 chronic kidney disease or end stage renal disease: Secondary | ICD-10-CM | POA: Diagnosis not present

## 2017-10-06 DIAGNOSIS — N186 End stage renal disease: Secondary | ICD-10-CM | POA: Insufficient documentation

## 2017-10-06 DIAGNOSIS — Z09 Encounter for follow-up examination after completed treatment for conditions other than malignant neoplasm: Secondary | ICD-10-CM | POA: Insufficient documentation

## 2017-10-06 DIAGNOSIS — Z872 Personal history of diseases of the skin and subcutaneous tissue: Secondary | ICD-10-CM | POA: Insufficient documentation

## 2017-10-06 DIAGNOSIS — L89153 Pressure ulcer of sacral region, stage 3: Secondary | ICD-10-CM | POA: Diagnosis not present

## 2017-10-20 DIAGNOSIS — H02831 Dermatochalasis of right upper eyelid: Secondary | ICD-10-CM | POA: Diagnosis not present

## 2017-10-20 DIAGNOSIS — H2703 Aphakia, bilateral: Secondary | ICD-10-CM | POA: Diagnosis not present

## 2017-10-20 DIAGNOSIS — H02834 Dermatochalasis of left upper eyelid: Secondary | ICD-10-CM | POA: Diagnosis not present

## 2017-10-20 DIAGNOSIS — H1812 Bullous keratopathy, left eye: Secondary | ICD-10-CM | POA: Diagnosis not present

## 2017-10-20 DIAGNOSIS — H1712 Central corneal opacity, left eye: Secondary | ICD-10-CM | POA: Diagnosis not present

## 2018-05-24 ENCOUNTER — Emergency Department (HOSPITAL_COMMUNITY): Payer: Medicare HMO

## 2018-05-24 ENCOUNTER — Inpatient Hospital Stay (HOSPITAL_COMMUNITY)
Admission: EM | Admit: 2018-05-24 | Discharge: 2018-06-05 | DRG: 872 | Disposition: A | Discharge status: Skilled Nursing Facility | Payer: Medicare HMO | Attending: Internal Medicine | Admitting: Internal Medicine

## 2018-05-24 DIAGNOSIS — E118 Type 2 diabetes mellitus with unspecified complications: Secondary | ICD-10-CM | POA: Diagnosis not present

## 2018-05-24 DIAGNOSIS — E876 Hypokalemia: Secondary | ICD-10-CM | POA: Diagnosis present

## 2018-05-24 DIAGNOSIS — Z8673 Personal history of transient ischemic attack (TIA), and cerebral infarction without residual deficits: Secondary | ICD-10-CM | POA: Diagnosis not present

## 2018-05-24 DIAGNOSIS — R197 Diarrhea, unspecified: Secondary | ICD-10-CM

## 2018-05-24 DIAGNOSIS — E785 Hyperlipidemia, unspecified: Secondary | ICD-10-CM | POA: Diagnosis present

## 2018-05-24 DIAGNOSIS — E119 Type 2 diabetes mellitus without complications: Secondary | ICD-10-CM

## 2018-05-24 DIAGNOSIS — L89151 Pressure ulcer of sacral region, stage 1: Secondary | ICD-10-CM | POA: Diagnosis not present

## 2018-05-24 DIAGNOSIS — L89152 Pressure ulcer of sacral region, stage 2: Secondary | ICD-10-CM | POA: Diagnosis present

## 2018-05-24 DIAGNOSIS — R4182 Altered mental status, unspecified: Secondary | ICD-10-CM | POA: Diagnosis present

## 2018-05-24 DIAGNOSIS — G609 Hereditary and idiopathic neuropathy, unspecified: Secondary | ICD-10-CM | POA: Diagnosis present

## 2018-05-24 DIAGNOSIS — B029 Zoster without complications: Secondary | ICD-10-CM | POA: Diagnosis present

## 2018-05-24 DIAGNOSIS — I10 Essential (primary) hypertension: Secondary | ICD-10-CM | POA: Diagnosis present

## 2018-05-24 DIAGNOSIS — I272 Pulmonary hypertension, unspecified: Secondary | ICD-10-CM | POA: Diagnosis present

## 2018-05-24 DIAGNOSIS — H5462 Unqualified visual loss, left eye, normal vision right eye: Secondary | ICD-10-CM | POA: Diagnosis present

## 2018-05-24 DIAGNOSIS — Z20828 Contact with and (suspected) exposure to other viral communicable diseases: Secondary | ICD-10-CM | POA: Diagnosis present

## 2018-05-24 DIAGNOSIS — R680 Hypothermia, not associated with low environmental temperature: Secondary | ICD-10-CM | POA: Diagnosis present

## 2018-05-24 DIAGNOSIS — R6520 Severe sepsis without septic shock: Secondary | ICD-10-CM | POA: Diagnosis not present

## 2018-05-24 DIAGNOSIS — A419 Sepsis, unspecified organism: Secondary | ICD-10-CM | POA: Diagnosis present

## 2018-05-24 DIAGNOSIS — F0390 Unspecified dementia without behavioral disturbance: Secondary | ICD-10-CM | POA: Diagnosis present

## 2018-05-24 DIAGNOSIS — N179 Acute kidney failure, unspecified: Secondary | ICD-10-CM | POA: Diagnosis present

## 2018-05-24 DIAGNOSIS — Z79899 Other long term (current) drug therapy: Secondary | ICD-10-CM

## 2018-05-24 DIAGNOSIS — L89159 Pressure ulcer of sacral region, unspecified stage: Secondary | ICD-10-CM | POA: Diagnosis present

## 2018-05-24 DIAGNOSIS — R400 Somnolence: Secondary | ICD-10-CM

## 2018-05-24 DIAGNOSIS — E1165 Type 2 diabetes mellitus with hyperglycemia: Secondary | ICD-10-CM | POA: Diagnosis not present

## 2018-05-24 DIAGNOSIS — Z7982 Long term (current) use of aspirin: Secondary | ICD-10-CM

## 2018-05-24 DIAGNOSIS — D638 Anemia in other chronic diseases classified elsewhere: Secondary | ICD-10-CM | POA: Diagnosis present

## 2018-05-24 DIAGNOSIS — B028 Zoster with other complications: Secondary | ICD-10-CM | POA: Diagnosis not present

## 2018-05-24 DIAGNOSIS — H9190 Unspecified hearing loss, unspecified ear: Secondary | ICD-10-CM | POA: Diagnosis present

## 2018-05-24 DIAGNOSIS — G629 Polyneuropathy, unspecified: Secondary | ICD-10-CM

## 2018-05-24 DIAGNOSIS — R410 Disorientation, unspecified: Secondary | ICD-10-CM | POA: Diagnosis not present

## 2018-05-24 DIAGNOSIS — E1151 Type 2 diabetes mellitus with diabetic peripheral angiopathy without gangrene: Secondary | ICD-10-CM | POA: Diagnosis present

## 2018-05-24 DIAGNOSIS — A021 Salmonella sepsis: Secondary | ICD-10-CM | POA: Diagnosis not present

## 2018-05-24 DIAGNOSIS — E1159 Type 2 diabetes mellitus with other circulatory complications: Secondary | ICD-10-CM | POA: Diagnosis not present

## 2018-05-24 LAB — COMPREHENSIVE METABOLIC PANEL
ALT: 14 U/L (ref 0–44)
AST: 23 U/L (ref 15–41)
Albumin: 2.6 g/dL — ABNORMAL LOW (ref 3.5–5.0)
Alkaline Phosphatase: 54 U/L (ref 38–126)
Anion gap: 15 (ref 5–15)
BUN: 27 mg/dL — ABNORMAL HIGH (ref 8–23)
CO2: 18 mmol/L — ABNORMAL LOW (ref 22–32)
Calcium: 9.1 mg/dL (ref 8.9–10.3)
Chloride: 103 mmol/L (ref 98–111)
Creatinine, Ser: 1.51 mg/dL — ABNORMAL HIGH (ref 0.61–1.24)
GFR calc Af Amer: 46 mL/min — ABNORMAL LOW (ref >60)
GFR calc non Af Amer: 40 mL/min — ABNORMAL LOW (ref >60)
Glucose, Bld: 235 mg/dL — ABNORMAL HIGH (ref 70–99)
Potassium: 3.7 mmol/L (ref 3.5–5.1)
Sodium: 136 mmol/L (ref 135–145)
Total Bilirubin: 0.7 mg/dL (ref 0.3–1.2)
Total Protein: 5.8 g/dL — ABNORMAL LOW (ref 6.5–8.1)

## 2018-05-24 LAB — TYPE AND SCREEN
ABO/RH(D): O POS
Antibody Screen: NEGATIVE

## 2018-05-24 LAB — CBC WITH DIFFERENTIAL/PLATELET
Abs Immature Granulocytes: 0.01 K/uL (ref 0.00–0.07)
Basophils Absolute: 0 K/uL (ref 0.0–0.1)
Basophils Relative: 0 %
Eosinophils Absolute: 0 K/uL (ref 0.0–0.5)
Eosinophils Relative: 0 %
HCT: 42.7 % (ref 39.0–52.0)
Hemoglobin: 13.7 g/dL (ref 13.0–17.0)
Immature Granulocytes: 0 %
Lymphocytes Relative: 22 %
Lymphs Abs: 1.1 K/uL (ref 0.7–4.0)
MCH: 27.1 pg (ref 26.0–34.0)
MCHC: 32.1 g/dL (ref 30.0–36.0)
MCV: 84.6 fL (ref 80.0–100.0)
Monocytes Absolute: 0.2 K/uL (ref 0.1–1.0)
Monocytes Relative: 4 %
Neutro Abs: 3.7 K/uL (ref 1.7–7.7)
Neutrophils Relative %: 74 %
Platelets: 170 K/uL (ref 150–400)
RBC: 5.05 MIL/uL (ref 4.22–5.81)
RDW: 13.2 % (ref 11.5–15.5)
WBC: 5 K/uL (ref 4.0–10.5)
nRBC: 0 % (ref 0.0–0.2)

## 2018-05-24 LAB — LACTIC ACID, PLASMA
Lactic Acid, Venous: 3.9 mmol/L (ref 0.5–1.9)
Lactic Acid, Venous: 1.7 mmol/L (ref 0.5–1.9)

## 2018-05-24 LAB — PROTIME-INR
INR: 1.3 — ABNORMAL HIGH (ref 0.8–1.2)
Prothrombin Time: 16 seconds — ABNORMAL HIGH (ref 11.4–15.2)

## 2018-05-24 LAB — SARS CORONAVIRUS 2 BY RT PCR (HOSPITAL ORDER, PERFORMED IN ~~LOC~~ HOSPITAL LAB): SARS Coronavirus 2: NEGATIVE

## 2018-05-24 LAB — GLUCOSE, CAPILLARY: Glucose-Capillary: 192 mg/dL — ABNORMAL HIGH (ref 70–99)

## 2018-05-24 LAB — PROCALCITONIN: Procalcitonin: 1.69 ng/mL

## 2018-05-24 LAB — POC OCCULT BLOOD, ED: Fecal Occult Bld: NEGATIVE

## 2018-05-24 MED ORDER — VANCOMYCIN HCL 10 G IV SOLR: 1250.0000 mg | INTRAVENOUS | Status: DC

## 2018-05-24 MED ORDER — METRONIDAZOLE IN NACL 5-0.79 MG/ML-% IV SOLN
500.0000 mg | Freq: Once | INTRAVENOUS | Status: AC
  Administered 2018-05-24: 500 mg via INTRAVENOUS
  Filled 2018-05-24: qty 100

## 2018-05-24 MED ORDER — POLYETHYLENE GLYCOL 3350 17 G PO PACK: 17.0000 g | PACK | Freq: Every day | ORAL | Status: DC | PRN

## 2018-05-24 MED ORDER — DOCUSATE SODIUM 100 MG PO CAPS
100.0000 mg | ORAL_CAPSULE | Freq: Two times a day (BID) | ORAL | Status: DC
  Administered 2018-05-24: 100 mg via ORAL
  Filled 2018-05-24 (×2): qty 1

## 2018-05-24 MED ORDER — LACTATED RINGERS IV SOLN
INTRAVENOUS | Status: DC
  Administered 2018-05-24: 20:00:00 via INTRAVENOUS

## 2018-05-24 MED ORDER — VANCOMYCIN HCL IN DEXTROSE 1-5 GM/200ML-% IV SOLN: 1000.0000 mg | Freq: Once | INTRAVENOUS | Status: DC

## 2018-05-24 MED ORDER — METRONIDAZOLE IN NACL 5-0.79 MG/ML-% IV SOLN
500.0000 mg | Freq: Three times a day (TID) | INTRAVENOUS | Status: DC
  Administered 2018-05-24 – 2018-05-26 (×5): 500 mg via INTRAVENOUS
  Filled 2018-05-24 (×5): qty 100

## 2018-05-24 MED ORDER — SODIUM CHLORIDE 0.9 % IV SOLN
2.0000 g | INTRAVENOUS | Status: DC
  Administered 2018-05-25 – 2018-05-26 (×2): 2 g via INTRAVENOUS
  Filled 2018-05-24 (×2): qty 2

## 2018-05-24 MED ORDER — ONDANSETRON HCL 4 MG PO TABS: 4.0000 mg | ORAL_TABLET | Freq: Four times a day (QID) | ORAL | Status: DC | PRN

## 2018-05-24 MED ORDER — DEXTROSE 5 % IV SOLN
700.0000 mg | Freq: Three times a day (TID) | INTRAVENOUS | Status: DC
  Administered 2018-05-24 – 2018-05-29 (×15): 700 mg via INTRAVENOUS
  Filled 2018-05-24 (×17): qty 14

## 2018-05-24 MED ORDER — SODIUM CHLORIDE 0.9 % IV SOLN
2.0000 g | Freq: Once | INTRAVENOUS | Status: AC
  Administered 2018-05-24: 2 g via INTRAVENOUS
  Filled 2018-05-24: qty 2

## 2018-05-24 MED ORDER — BRIMONIDINE TARTRATE 0.2 % OP SOLN
1.0000 [drp] | Freq: Two times a day (BID) | OPHTHALMIC | Status: DC
  Administered 2018-05-24 – 2018-06-05 (×24): 1 [drp] via OPHTHALMIC
  Filled 2018-05-24: qty 5

## 2018-05-24 MED ORDER — INSULIN ASPART 100 UNIT/ML ~~LOC~~ SOLN
0.0000 [IU] | Freq: Three times a day (TID) | SUBCUTANEOUS | Status: DC
  Administered 2018-05-26: 1 [IU] via SUBCUTANEOUS

## 2018-05-24 MED ORDER — ACETAMINOPHEN 650 MG RE SUPP
650.0000 mg | Freq: Four times a day (QID) | RECTAL | Status: DC | PRN
  Administered 2018-05-26: 650 mg via RECTAL
  Filled 2018-05-24: qty 1

## 2018-05-24 MED ORDER — ENOXAPARIN SODIUM 40 MG/0.4ML ~~LOC~~ SOLN
40.0000 mg | SUBCUTANEOUS | Status: DC
  Administered 2018-05-24 – 2018-06-04 (×12): 40 mg via SUBCUTANEOUS
  Filled 2018-05-24 (×12): qty 0.4

## 2018-05-24 MED ORDER — ACETAMINOPHEN 325 MG PO TABS: 650.0000 mg | ORAL_TABLET | Freq: Four times a day (QID) | ORAL | Status: DC | PRN

## 2018-05-24 MED ORDER — SODIUM CHLORIDE 0.9 % IV BOLUS
1000.0000 mL | Freq: Once | INTRAVENOUS | Status: AC
  Administered 2018-05-24: 14:00:00 1000 mL via INTRAVENOUS

## 2018-05-24 MED ORDER — VANCOMYCIN HCL 10 G IV SOLR
1500.0000 mg | Freq: Once | INTRAVENOUS | Status: AC
  Administered 2018-05-24: 14:00:00 1500 mg via INTRAVENOUS
  Filled 2018-05-24: qty 1500

## 2018-05-24 MED ORDER — TIMOLOL MALEATE 0.5 % OP SOLN
1.0000 [drp] | Freq: Every day | OPHTHALMIC | Status: DC
  Administered 2018-05-24 – 2018-06-05 (×13): 1 [drp] via OPHTHALMIC
  Filled 2018-05-24: qty 5

## 2018-05-24 MED ORDER — ONDANSETRON HCL 4 MG/2ML IJ SOLN: 4.0000 mg | Freq: Four times a day (QID) | INTRAMUSCULAR | Status: DC | PRN

## 2018-05-24 MED ORDER — SODIUM CHLORIDE 0.9 % IV BOLUS
1000.0000 mL | Freq: Once | INTRAVENOUS | Status: AC
  Administered 2018-05-24: 18:00:00 1000 mL via INTRAVENOUS

## 2018-05-24 MED ORDER — SODIUM CHLORIDE 0.9% FLUSH
3.0000 mL | Freq: Two times a day (BID) | INTRAVENOUS | Status: DC
  Administered 2018-05-24 – 2018-06-04 (×14): 3 mL via INTRAVENOUS

## 2018-05-24 NOTE — ED Notes (Signed)
Emmy, RN notified of lactic of 3.9

## 2018-05-24 NOTE — ED Provider Notes (Addendum)
[facility] EMERGENCY DEPARTMENT Provider Note   CSN: [id number] Arrival date & time: 05/24/18  1218    History   Chief Complaint No chief complaint on file.   HPI Curtis Brock is a 85 y.o. male.   level 5 caveat, altered ms,  HPI 85 yo male presents ems, ems reports ho from [place].  LKN 8 pm last night when went to bed.  Daughter tried to get him up this am but found altered.  EMS report response to verbal stimuli but confused. Reported fever at home per daughter but hypothermic to 97 per ems. Prehospital bp 70-80 BS 225 Discussed with [name], daughter, states went to bed about 8p last night.  States she went to get him up and noted loose stool.  She normally can get him up but couldn't get him up.  She would call his name and he seemed to look up. Normally will talk and carry on conversation but may not know date.  Patient walks little with assitance.  She states she was supposed to OT/PT but una Dr. Ermalene Postin [address] New provider for patient No rx except eyedrops Normal po yesterda No sick contacts Rash right chest several days ago and putting cortisone cream on but seemed worse today Past Medical History:  Diagnosis Date  . Blind left eye     Patient Active Problem List   Diagnosis Date Noted  . Acute kidney injury ([place])   . Acute renal failure syndrome ([place])   . Sacral decubitus ulcer   . CVA (cerebral vascular accident) ([place])   . Pulmonary hypertension ([place])   . Metabolic acidosis   . Midline low back pain without sciatica   . Altered mental status   . Hypothermia   . Hyperkalemia   . Diabetes type 2, controlled ([place])   . SIRS (systemic inflammatory response syndrome) ([place]) 05/01/2014  . Acute encephalopathy 05/01/2014  . Fungal rash of trunk 05/01/2014  . Heel spur 05/04/2013  . Weakness 04/17/2013  . Anemia of other chronic disease 06/10/2012  . Essential hypertension, benign 06/03/2012  . Type II or unspecified  type diabetes mellitus with neurological manifestations, not stated as uncontrolled(250.60) 06/03/2012  . Abnormality of gait 05/28/2012  . Type I (juvenile type) diabetes mellitus with peripheral circulatory disorders, not stated as uncontrolled(250.71) 05/28/2012  . Unspecified hereditary and idiopathic peripheral neuropathy 05/28/2012  . Unspecified late effects of cerebrovascular disease 05/28/2012  . Weakness generalized 05/11/2012  . Blind left eye 12/11/2011  . Neuropathy 12/11/2011  . Elevated LFTs 11/12/2011  . Hyperlipidemia 03/26/2011  . CVA (cerebral infarction) 03/26/2011    Past Surgical History:  Procedure Laterality Date  . ABDOMINAL AORTOGRAM W/LOWER EXTREMITY N/A 10/02/2016   Procedure: ABDOMINAL AORTOGRAM W/LOWER EXTREMITY;  Surgeon: Waynetta Sandy, MD;  Location: [facility] [facility];  Service: Cardiovascular;  Laterality: N/A;  . PERIPHERAL VASCULAR BALLOON ANGIOPLASTY Left 10/02/2016   Procedure: PERIPHERAL VASCULAR BALLOON ANGIOPLASTY;  Surgeon: Waynetta Sandy, MD;  Location: [facility];  Service: Cardiovascular;  Laterality: Left;        Home Medications    Prior to Admission medications   Medication Sig Start Date End Date Taking? Authorizing Provider  aspirin 81 MG chewable tablet Chew 81 mg by mouth daily.    [provider]  ferrous sulfate 325 (65 FE) MG tablet Take 325 mg by mouth daily with breakfast.    [provider]  Multiple Vitamin (MULTIVITAMIN WITH MINERALS)  TABS tablet Take 1 tablet by mouth daily.    [provider]  timolol (TIMOPTIC) 0.5 % ophthalmic solution Place 1 drop into both eyes daily.  07/29/16   [provider]    Family History No family history on file.  Social History Social History   Tobacco Use  . Smoking status: Never Smoker  . Smokeless tobacco: Never Used  Substance Use Topics  . Alcohol use: No  . Drug use: No     Allergies   Patient has no known  allergies.   Review of Systems Review of Systems  Unable to perform ROS: Mental status change     Physical Exam Updated Vital Signs There were no vitals taken for this visit.  Physical Exam Vitals signs and nursing note reviewed. Exam conducted with a chaperone present.  Constitutional:      General: He is not in acute distress.    Appearance: He is ill-appearing. He is not diaphoretic.     Comments: Elderly chronically ill appearing   HENT:     Head: Normocephalic and atraumatic.     Right Ear: External ear normal.     Left Ear: External ear normal.     Mouth/Throat:     Mouth: Mucous membranes are dry.  Eyes:     Conjunctiva/sclera: Conjunctivae normal.     Comments: Right pupil sluggish Left eye opacification  Neck:     Musculoskeletal: Normal range of motion.  Cardiovascular:     Rate and Rhythm: Normal rate and regular rhythm.  Pulmonary:     Comments: Decreased bs throughout Abdominal:     General: Abdomen is flat. Bowel sounds are normal.     Palpations: Abdomen is soft.     Tenderness: There is no abdominal tenderness.  Genitourinary:    Comments: Black loose stool at rectum with skin breakdown of perirectal skin Musculoskeletal:        General: No swelling or deformity.     Comments: Passive rom bilateral lower extremities with some stiffness No obvious deformity or skin break down of extremties Radial pulses thready Femoral strong 3+ bilaterally  Skin:    General: Skin is warm and dry.     Capillary Refill: Capillary refill takes 2 to 3 seconds.     Comments: Macular papular rash right upper chest      ED Treatments / Results  Labs (all labs ordered are listed, but only abnormal results are displayed) Labs Reviewed  SARS CORONAVIRUS 2 (HOSPITAL ORDER, [facility])    EKG EKG Interpretation  Date/Time:  Sunday May 24 2018 12:35:34 EDT Ventricular Rate:  90 PR Interval:    QRS Duration: 71 QT Interval:  428 QTC  Calculation: 524 R Axis:   55 Text Interpretation:  Normal sinus rhythm nonspecific twave changes diffusely Confirmed by Pattricia Boss [phone]) on 05/24/2018 12:42:38 PM   Radiology No results found.  Procedures Procedures (including critical care time)  Medications Ordered in ED Medications  ceFEPIme (MAXIPIME) 2 g in sodium chloride 0.9 % 100 mL IVPB (has no administration in time range)  metroNIDAZOLE (FLAGYL) IVPB 500 mg (has no administration in time range)  vancomycin (VANCOCIN) IVPB 1000 mg/200 mL premix (has no administration in time range)     Initial Impression / Assessment and Plan / ED Course  I have reviewed the triage vital signs and the nursing notes.  Pertinent labs & imaging results that were available during my care of the patient were reviewed by  me and considered in my medical decision making (see chart for details).  Clinical Course as of May 23 1601  Sun May 24, 2018  1451 Elevated lactate noted.  Patient's blood pressure 119/68   [DR]  1601 Lactic acid, plasma [DR]    Clinical Course User Index [DR] Pattricia Boss, MD       63 male presents with diarrhea, ams prehospital hypotension and hypothermia.  Intiated as code sepsis.  Initial lactic 3.9, repeat after liter ns 1.7 Broad spectrum abx initiated Diarrhea noted on exam Rash right upper chest noted-ddx right sided ? Herpetic, vs fungal vs bacterial vs allergic- appears stable, well localized- not c.w. cellulitis CXR clear 4:09 PM Patient more awake- answers some questions, not able to tell me location VSS No urine noted in foley bag Additional 1 liter ns ordered  DW Dr. Lorin Mercy and will see for admission STool studies added  CRITICAL CARE Performed by: Pattricia Boss Total critical care time: 60 minutes Critical care time was exclusive of separately billable procedures and treating other patients. Critical care was necessary to treat or prevent imminent or life-threatening deterioration.  Critical care was time spent personally by me on the following activities: development of treatment plan with patient and/or surrogate as well as nursing, discussions with consultants, evaluation of patient's response to treatment, examination of patient, obtaining history from patient or surrogate, ordering and performing treatments and interventions, ordering and review of laboratory studies, ordering and review of radiographic studies, pulse oximetry and re-evaluation of patient's condition.  Final Clinical Impressions(s) / ED Diagnoses   Final diagnoses:  Sepsis, due to unspecified organism, unspecified whether acute organ dysfunction present [facility])  Diarrhea, unspecified type    ED Discharge Orders    None       Pattricia Boss, MD 05/24/18 1635    Pattricia Boss, MD 05/24/18 [phone]

## 2018-05-24 NOTE — Progress Notes (Signed)
Pharmacy Antibiotic Note  Curtis Brock. is a 85 y.o. male admitted on 05/24/2018 with concerns for sepsis.  Pharmacy has been consulted for vancomycin/cefepime dosing. Unable to obtain current weight, will utilize old weight for vancomycin dosing (70.9 kg). LA 3.9. Scr 1.51, up from baseline (BL 0.9). CrCL ~ 30 mL/min.  Vancomycin 1250 mg IV Q 48 hrs. Goal AUC 400-550. Expected AUC: 425 SCr used: 1.51   Plan: Vancomycin 1500 mg IV x1 then 1250 mg IV q48h Cefepime 2g IV q24h Monitor clinical status, cultures, renal function, and LOT   Temp (24hrs), Avg:99.6 F (37.6 C), Min:99.6 F (37.6 C), Max:99.6 F (37.6 C)  Recent Labs  Lab 05/24/18 1310  WBC PENDING  CREATININE 1.51*  LATICACIDVEN 3.9*    CrCl cannot be calculated (Unknown ideal weight.).    No Known Allergies  Antimicrobials this admission: Vancomycin 5/10 >> Cefepime 5/10 >> Metronidazole x 1 5/10   Dose adjustments this admission:   Microbiology results: 5/10 BCx: 5/10 covid: negative   Thank you for allowing pharmacy to be a part of this patient's care.   Claiborne Billings, PharmD PGY2 Cardiology Pharmacy Resident Please check AMION for all Pharmacist numbers by unit 05/24/2018 2:56 PM

## 2018-05-24 NOTE — ED Triage Notes (Signed)
Arrives from  Home, lsw 0000. Normally alert, oriented x4, but now unresponsive to all but loud stimuli to his name

## 2018-05-24 NOTE — ED Notes (Signed)
Yates, MD at bedside 

## 2018-05-24 NOTE — ED Notes (Signed)
Pt's monitor frequently beeping for apnea, pt observed to be breathing unlabored

## 2018-05-24 NOTE — H&P (Signed)
History and Physical    Curtis Brock. VEL:[id number] DOB: [birth year] DOA: 05/24/2018  PCP: [place], [place]  Patient coming from:  Home - lives with daughter; Curtis Brock: Curtis Brock, [phone]  Chief Complaint: AMS, hypothermia  HPI: Curtis Brock. is a 85 y.o. male with medical history significant of left eye blindness; prior DM; and PVD with L great toe ulceration s/p balloon angioplasty 9/18 presenting with AMS.  She went down to bathe him this AM; he wasn't responding and he has "messed everywhere."  He would open his eyes and look at her but wasn't able to talk.  Normally, he can talk and answer questions and carry on a conversation.  His daughter notices a sacral wound - he was getting wound care for it but it looks like it has opened back up.  The rash on his chest has been there for a few days; it was there in the past and she put cortisone cream on it and it cleared up but now it is the same thing.  He has not had the shingles vaccine.   ED Course:   Minimally verbal.  Requires full care at baseline but able to converse.  Minimally responsive this AM with loose stools overnight. Hypothermic and hypotensive with EMS.  Temp 97.6.  BP >100 here.  Treated as sepsis, given 1L IVF and giving another and antibiotics.  Rash on right chest - ?vesicular.  Dark, loose stool - heme negative.  COVID negative.  Review of Systems:  Unable to perform  Ambulatory Status:  Nonambulatory other than short distances with a walker   Past Medical History:  Diagnosis Date  . Blind left eye     Past Surgical History:  Procedure Laterality Date  . ABDOMINAL AORTOGRAM W/LOWER EXTREMITY N/A 10/02/2016   Procedure: ABDOMINAL AORTOGRAM W/LOWER EXTREMITY;  Surgeon: Waynetta Sandy, MD;  Location: [facility];  Service: Cardiovascular;  Laterality: N/A;  . PERIPHERAL VASCULAR BALLOON ANGIOPLASTY Left 10/02/2016   Procedure: PERIPHERAL VASCULAR BALLOON ANGIOPLASTY;  Surgeon:  Waynetta Sandy, MD;  Location: [facility];  Service: Cardiovascular;  Laterality: Left;    Social History   Socioeconomic History  . Marital status: Widowed    Spouse name: Not on file  . Number of children: Not on file  . Years of education: Not on file  . Highest education level: Not on file  Occupational History  . Not on file  Social Needs  . Financial resource strain: Not on file  . Food insecurity:    Worry: Not on file    Inability: Not on file  . Transportation needs:    Medical: Not on file    Non-medical: Not on file  Tobacco Use  . Smoking status: Never Smoker  . Smokeless tobacco: Never Used  Substance and Sexual Activity  . Alcohol use: No  . Drug use: No  . Sexual activity: Never  Lifestyle  . Physical activity:    Days per week: Not on file    Minutes per session: Not on file  . Stress: Not on file  Relationships  . Social connections:    Talks on phone: Not on file    Gets together: Not on file    Attends religious service: Not on file    Active member of club or organization: Not on file    Attends meetings of clubs or organizations: Not on file    Relationship status: Not on file  .  Intimate partner violence:    Fear of current or ex partner: Not on file    Emotionally abused: Not on file    Physically abused: Not on file    Forced sexual activity: Not on file  Other Topics Concern  . Not on file  Social History Narrative   Patient admitted to [facility]/2017 for respite care.    No Known Allergies  No family history on file.  Prior to Admission medications   Medication Sig Start Date End Date Taking? Authorizing Provider  acetaminophen (TYLENOL) 325 MG tablet Take by mouth every 6 (six) hours as needed for mild pain.   Yes [provider]  aspirin 81 MG chewable tablet Chew 81 mg by mouth daily.   Yes [provider]  brimonidine (ALPHAGAN) 0.2 % ophthalmic solution Place 1 drop into the right eye  2 (two) times daily.   Yes [provider]  Collagen Matrix, Ovine, (ENDOFORM DERMAL TEMPLATE EX) Apply 1 patch topically daily.   Yes [provider]  ferrous sulfate 325 (65 FE) MG tablet Take 325 mg by mouth daily with breakfast.   Yes [provider]  hydrocortisone cream 1 % Apply 1 application topically as needed (rash).   Yes [provider]  Iron-Vitamins (GERITOL) LIQD Take 5 mLs by mouth every other day.   Yes [provider]  Multiple Vitamin (MULTIVITAMIN WITH MINERALS) TABS tablet Take 1 tablet by mouth daily.   Yes [provider]  timolol (TIMOPTIC) 0.5 % ophthalmic solution Place 1 drop into the right eye daily.  07/29/16  Yes [provider]    Physical Exam: Vitals:   05/24/18 1500 05/24/18 1522 05/24/18 1600 05/24/18 1630  BP: 113/66 93/75 (!) 110/54   Pulse: 79 81 86 84  Resp: 16 17 16 17   Temp:      TempSrc:      SpO2: 97% 98% 99% 98%     . General:  Appears calm and comfortable and is NAD, slow to respond but does open eyes and turn head toward voice . Eyes: normal lids, iris . ENT:  Hard of hearing, normal lips & tongue, mildly dry mm . Neck:  no LAD, masses or thyromegaly . Cardiovascular:  RRR, no m/r/g. No LE edema.  [name] Respiratory:   CTA bilaterally with no wheezes/rales/rhonchi.  Normal respiratory effort. . Abdomen:  soft, NT, ND, NABS . Back:   normal alignment, no CVAT . Skin:  Erythematous macules, vesicles on R upper chest in distinct dermatomal pattern     . Musculoskeletal:  Somewhat decreased tone BUE/BLE, good ROM, no bony abnormality . Psychiatric:  Slow to respond, speech sparse with only a few intelligible words . Neurologic:  Unable to perform    Radiological Exams on Admission: Ct Head Wo Contrast  Result Date: 05/24/2018 CLINICAL DATA:  Unresponsive EXAM: CT HEAD WITHOUT CONTRAST TECHNIQUE: Contiguous axial images were obtained from the base of the skull through the  vertex without intravenous contrast. COMPARISON:  CT brain, 10/16/2016 FINDINGS: Brain: No evidence of acute infarction, hemorrhage, hydrocephalus, extra-axial collection or mass lesion/mass effect. Extensive periventricular white matter hypodensity. Vascular: No hyperdense vessel or unexpected calcification. Skull: Normal. Negative for fracture or focal lesion. Sinuses/Orbits: No acute finding. Other: None. IMPRESSION: No acute intracranial pathology.  Small-vessel white matter disease. Electronically Signed   By: Eddie Candle M.D.   On: 05/24/2018 15:53   Dg Chest Port 1 View  Result Date: 05/24/2018 CLINICAL DATA:  Hypothermia and  altered mental status. EXAM: PORTABLE CHEST 1 VIEW COMPARISON:  05/13/2014 and prior radiographs FINDINGS: The cardiomediastinal silhouette is unremarkable. Elevated RIGHT hemidiaphragm again noted. There is no evidence of focal airspace disease, pulmonary edema, suspicious pulmonary nodule/mass, pleural effusion, or pneumothorax. No acute bony abnormalities are identified. IMPRESSION: No active disease. Electronically Signed   By: Margarette Canada M.D.   On: 05/24/2018 14:49    EKG: Independently reviewed.  NSR with rate 90; nonspecific ST changes with no evidence of acute ischemia   Labs on Admission: I have personally reviewed the available labs and imaging studies at the time of the admission.  Pertinent labs:   CO2 18 Glucose 235 BUN 27/Creatinine 1.51/GFR 46; 11/0.8 in 2/19 Albumin 2.6    Assessment/Plan Principal Problem:   Sepsis ([place]) Active Problems:   Essential hypertension, benign   Altered mental status   Diabetes type 2, controlled ([place])   Sacral decubitus ulcer   Acute renal failure syndrome (HCC)   Shingles   Sepsis -SIRS criteria in this patient includes:  Tachycardia, tachypnea  -Patient has evidence of acute organ failure with elevated lactate  -While awaiting blood cultures, this appears to be a preseptic condition. -Sepsis protocol  initiated -Suspected source is unknown - CXR does not indicate a respiratory source; he does have diarrhea and so an enteric pathogen must be considered and stool studies (GI pathogen panel and C diff) have been ordered); UA and culture are pending; and CNS infection is also considered although he has already received antibiotics and will receive antifungals and it seems excessive to subject him unnecessarily to LP given his advanced AMS at baseline and uncertainty about how different he truly is from baseline -Blood and urine cultures pending -Will admit to SDU and continue to monitor -Will admit due to: hemodynamic instability; AMS that is severe or persistent - as noted, uncertain how different this is from his baseline -Treat with IV Cefepime/Vanc/Flagyl for undifferentiated sepsis -Lactate cleared with rehydration -Will order sepsis protocol procalcitonin level.  Antibiotics would not be indicated for PCT <0.1 and probably should not be used for < 0.25.  >0.5 indicates infection and >>0.5 indicates more serious disease.  As the procalcitonin level normalizes, it will be reasonable to consider de-escalation of antibiotic coverage.  AMS -As noted above, patient has advanced dementia at baseline -Per family, he is able to have a conversation although he is not oriented to time -This AM he was unresponsive -He is now able to respond and has some speech but it is sparse -No LP for now, although meningitis/encephalitis are remote considerations, as the risk:benefit ratio seems inappropriate  Shingles -The rash on his R upper chest is somewhat atypical for shingles in that the lesions all appear to be about the same stages of healing -However, the rash is distinctly dermatomal and has vesicles and so is clearly concerning for zoster -He has not been immunized against shingles -Will treat for now with acyclovir 10 mg/kg IV q8h  Acute renal failure -Appears to have baseline creatinine about 0.8,  current 1.5 -Will rehydrate and recheck in AM  DM -Patient has not taken medication for this in some time -Will cover with sensitive-scale SSI in order to facilitate wound healing -However, he is unlikely to suffer long-term effects of diabetes and so outpatient treatment is probably not needed.  Sacral pressure ulcer -Daughter reports recurrent sacral pressure ulcer -Wound care consult requested  HTN -He does not take medication for this issue, either    Note:  This patient has been tested and is negative for the novel coronavirus COVID-19.   DVT prophylaxis:  Lovenox Code Status:  Full - confirmed with family.  He would not want life support for an extended time.  He has said he would want attempt at resuscitation.  His daughter reports that the patient is the one in charge of this decision and they have always allowed him to choose what he wants. Family Communication: None present; I spoke with the daughter by telephone  Disposition Plan:  Home once clinically improved Consults called: None  Admission status: Admit - It is my clinical opinion that admission to INPATIENT is reasonable and necessary because of the expectation that this patient will require hospital care that crosses at least 2 midnights to treat this condition based on the medical complexity of the problems presented.  Given the aforementioned information, the predictability of an adverse outcome is felt to be significant.   Karmen Bongo MD [facility]   How to contact the [facility] Attending or Consulting provider [place] or covering provider during after hours [place], for this patient?  1. Check the care team in [facility] and look for a) attending/consulting [facility] provider listed and b) the [facility] team listed 2. Log into [email or web address] and use [facility]'s universal password to access. If you do not have the password, please contact the hospital operator. 3. Locate the [facility] provider you are looking for under [facility] and page to a number that you can be directly reached. 4. If you still have difficulty reaching the provider, please page the [facility] (Director on Call) for the Hospitalists listed on amion for assistance.   05/24/2018, 5:12 PM

## 2018-05-25 DIAGNOSIS — A419 Sepsis, unspecified organism: Principal | ICD-10-CM

## 2018-05-25 DIAGNOSIS — R410 Disorientation, unspecified: Secondary | ICD-10-CM

## 2018-05-25 DIAGNOSIS — E1165 Type 2 diabetes mellitus with hyperglycemia: Secondary | ICD-10-CM

## 2018-05-25 DIAGNOSIS — L89151 Pressure ulcer of sacral region, stage 1: Secondary | ICD-10-CM

## 2018-05-25 DIAGNOSIS — A021 Salmonella sepsis: Secondary | ICD-10-CM

## 2018-05-25 DIAGNOSIS — B028 Zoster with other complications: Secondary | ICD-10-CM

## 2018-05-25 DIAGNOSIS — E118 Type 2 diabetes mellitus with unspecified complications: Secondary | ICD-10-CM

## 2018-05-25 DIAGNOSIS — R197 Diarrhea, unspecified: Secondary | ICD-10-CM

## 2018-05-25 LAB — BASIC METABOLIC PANEL
Anion gap: 11 (ref 5–15)
BUN: 21 mg/dL (ref 8–23)
CO2: 23 mmol/L (ref 22–32)
Calcium: 8.4 mg/dL — ABNORMAL LOW (ref 8.9–10.3)
Chloride: 101 mmol/L (ref 98–111)
Creatinine, Ser: 1.02 mg/dL (ref 0.61–1.24)
GFR calc Af Amer: >60 mL/min (ref >60)
GFR calc non Af Amer: >60 mL/min (ref >60)
Glucose, Bld: 265 mg/dL — ABNORMAL HIGH (ref 70–99)
Potassium: 3.1 mmol/L — ABNORMAL LOW (ref 3.5–5.1)
Sodium: 135 mmol/L (ref 135–145)

## 2018-05-25 LAB — URINALYSIS, ROUTINE W REFLEX MICROSCOPIC
Bilirubin Urine: NEGATIVE
Glucose, UA: NEGATIVE mg/dL
Ketones, ur: NEGATIVE mg/dL
Nitrite: POSITIVE — AB
Protein, ur: 30 mg/dL — AB
Specific Gravity, Urine: 1.028 (ref 1.005–1.030)
WBC, UA: >50 WBC/hpf — ABNORMAL HIGH (ref 0–5)
pH: 5 (ref 5.0–8.0)

## 2018-05-25 LAB — C DIFFICILE QUICK SCREEN W PCR REFLEX
C Diff antigen: NEGATIVE
C Diff interpretation: NOT DETECTED
C Diff toxin: NEGATIVE

## 2018-05-25 LAB — CBC
HCT: 32.5 % — ABNORMAL LOW (ref 39.0–52.0)
Hemoglobin: 10.7 g/dL — ABNORMAL LOW (ref 13.0–17.0)
MCH: 27.1 pg (ref 26.0–34.0)
MCHC: 32.9 g/dL (ref 30.0–36.0)
MCV: 82.3 fL (ref 80.0–100.0)
Platelets: 145 10*3/uL — ABNORMAL LOW (ref 150–400)
RBC: 3.95 MIL/uL — ABNORMAL LOW (ref 4.22–5.81)
RDW: 13.2 % (ref 11.5–15.5)
WBC: 7.2 10*3/uL (ref 4.0–10.5)
nRBC: 0 % (ref 0.0–0.2)

## 2018-05-25 LAB — GASTROINTESTINAL PANEL BY PCR, STOOL (REPLACES STOOL CULTURE)

## 2018-05-25 LAB — GLUCOSE, CAPILLARY
Glucose-Capillary: 95 mg/dL (ref 70–99)
Glucose-Capillary: 94 mg/dL (ref 70–99)
Glucose-Capillary: 99 mg/dL (ref 70–99)

## 2018-05-25 LAB — URINE CULTURE: Culture: NO GROWTH

## 2018-05-25 MED ORDER — VANCOMYCIN HCL IN DEXTROSE 1-5 GM/200ML-% IV SOLN
1000.0000 mg | INTRAVENOUS | Status: DC
  Administered 2018-05-25 – 2018-05-27 (×3): 1000 mg via INTRAVENOUS
  Filled 2018-05-25 (×3): qty 200

## 2018-05-25 MED ORDER — DEXTROSE-NACL 5-0.9 % IV SOLN
INTRAVENOUS | Status: DC
  Administered 2018-05-25 – 2018-05-26 (×3): via INTRAVENOUS
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
## 2018-05-25 NOTE — Consult Note (Signed)
[place] Nurse wound consult note Reason for Consult: Consult requested for sacrum.  Pt is familiar to [facility] team from a recent admission on 4/25 Wound type: Sacrum/inner gluteal fold with stage 2 pressure injury Pressure Injury POA: Yes Measurement: 1X.3X.3cm Wound bed: red and moist Drainage (amount, consistency, odor) small amt pink drainage, no odor Periwound: intact skin surrounding Dressing procedure/placement/frequency: pt is frequently incontinent of loose stools and it is difficult to keep wound from becoming soiled.  Foam dressing to protect and promote healing. Please re-consult if further assistance is needed.  Thank-you,  Julien Girt MSN, [place], [place], [place], [place]

## 2018-05-25 NOTE — Progress Notes (Signed)
Pharmacy Antibiotic Note  Curtis Brock. is a 85 y.o. male admitted on 05/24/2018 with concerns for sepsis.  Pharmacy has been consulted for vancomycin/cefepime dosing. Unable to obtain current weight, will utilize old weight for vancomycin dosing (70.9 kg). LA 3.9. Scr 1.51, up from baseline (BL 0.9). CrCL ~ 30 mL/min.  Plan: Vancomycin 1500 mg IV x1 then 1000 mg q24,  AUC 484, SCr 1.02 Cefepime 2g IV q24h Flagyl 500mg  IV q8 Acyclovir 700mg  IV q8 - shingles R upper chest rash Monitor clinical status, cultures, renal function, and LOT   Temp (24hrs), Avg:98.1 F (36.7 C), Min:97.5 F (36.4 C), Max:99.6 F (37.6 C)  Recent Labs  Lab 05/24/18 1310 05/24/18 1516 05/25/18 0653  WBC 5.0  --  7.2  CREATININE 1.51*  --  1.02  LATICACIDVEN 3.9* 1.7  --     CrCl cannot be calculated (Unknown ideal weight.).    No Known Allergies  Antimicrobials this admission: Vancomycin 5/10 >> Cefepime 5/10 >> Metronidazole 5/10 >> Acyclovir 700mg  IV q8hr  Dose adjustments this admission: 5/11 SCr 1.51 to 1.02, change Vanc 1250mg  q48 to 1gm q24  Microbiology results: 5/10 BCx: ngtd 5/10 covid: negative 5/10 UCx: in process 5/11 CDiff: neg/neg 511 GI panel: in process  Thank you for allowing pharmacy to be a part of this patient's care.  Minda Ditto PharmD Please check AMION for all Pharmacist numbers by unit 05/25/2018 11:40 AM

## 2018-05-26 DIAGNOSIS — L89152 Pressure ulcer of sacral region, stage 2: Secondary | ICD-10-CM

## 2018-05-26 DIAGNOSIS — E1159 Type 2 diabetes mellitus with other circulatory complications: Secondary | ICD-10-CM

## 2018-05-26 DIAGNOSIS — R6520 Severe sepsis without septic shock: Secondary | ICD-10-CM

## 2018-05-26 LAB — GLUCOSE, CAPILLARY
Glucose-Capillary: 130 mg/dL — ABNORMAL HIGH (ref 70–99)
Glucose-Capillary: 119 mg/dL — ABNORMAL HIGH (ref 70–99)
Glucose-Capillary: 141 mg/dL — ABNORMAL HIGH (ref 70–99)
Glucose-Capillary: 141 mg/dL — ABNORMAL HIGH (ref 70–99)
Glucose-Capillary: 140 mg/dL — ABNORMAL HIGH (ref 70–99)

## 2018-05-26 LAB — MAGNESIUM: Magnesium: 1.7 mg/dL (ref 1.7–2.4)

## 2018-05-26 LAB — CBC
HCT: 35.1 % — ABNORMAL LOW (ref 39.0–52.0)
Hemoglobin: 11.8 g/dL — ABNORMAL LOW (ref 13.0–17.0)
MCH: 27.2 pg (ref 26.0–34.0)
MCHC: 33.6 g/dL (ref 30.0–36.0)
MCV: 80.9 fL (ref 80.0–100.0)
Platelets: 136 10*3/uL — ABNORMAL LOW (ref 150–400)
RBC: 4.34 MIL/uL (ref 4.22–5.81)
RDW: 13 % (ref 11.5–15.5)
WBC: 8.1 10*3/uL (ref 4.0–10.5)
nRBC: 0 % (ref 0.0–0.2)

## 2018-05-26 LAB — BASIC METABOLIC PANEL
Anion gap: 9 (ref 5–15)
BUN: 15 mg/dL (ref 8–23)
CO2: 19 mmol/L — ABNORMAL LOW (ref 22–32)
Calcium: 8.6 mg/dL — ABNORMAL LOW (ref 8.9–10.3)
Chloride: 110 mmol/L (ref 98–111)
Creatinine, Ser: 0.88 mg/dL (ref 0.61–1.24)
GFR calc Af Amer: >60 mL/min (ref >60)
GFR calc non Af Amer: >60 mL/min (ref >60)
Glucose, Bld: 167 mg/dL — ABNORMAL HIGH (ref 70–99)
Potassium: 3 mmol/L — ABNORMAL LOW (ref 3.5–5.1)
Sodium: 138 mmol/L (ref 135–145)

## 2018-05-26 LAB — PROCALCITONIN: Procalcitonin: 0.6 ng/mL

## 2018-05-26 MED ORDER — POTASSIUM CHLORIDE CRYS ER 20 MEQ PO TBCR: 40.0000 meq | EXTENDED_RELEASE_TABLET | Freq: Two times a day (BID) | ORAL | Status: DC

## 2018-05-26 MED ORDER — ORAL CARE MOUTH RINSE
15.0000 mL | Freq: Two times a day (BID) | OROMUCOSAL | Status: DC
  Administered 2018-05-27 – 2018-06-04 (×16): 15 mL via OROMUCOSAL

## 2018-05-26 MED ORDER — CHLORHEXIDINE GLUCONATE 0.12 % MT SOLN
15.0000 mL | Freq: Two times a day (BID) | OROMUCOSAL | Status: DC
  Administered 2018-05-26 – 2018-06-05 (×20): 15 mL via OROMUCOSAL
  Filled 2018-05-26 (×19): qty 15

## 2018-05-26 MED ORDER — MAGNESIUM SULFATE 2 GM/50ML IV SOLN
2.0000 g | Freq: Once | INTRAVENOUS | Status: AC
  Administered 2018-05-26: 2 g via INTRAVENOUS
  Filled 2018-05-26: qty 50

## 2018-05-26 MED ORDER — ASPIRIN 81 MG PO CHEW
81.0000 mg | CHEWABLE_TABLET | Freq: Every day | ORAL | Status: DC
  Administered 2018-05-28 – 2018-06-05 (×9): 81 mg via ORAL
  Filled 2018-05-26 (×8): qty 1

## 2018-05-26 MED ORDER — SODIUM CHLORIDE 0.9% FLUSH
10.0000 mL | Freq: Two times a day (BID) | INTRAVENOUS | Status: DC
  Administered 2018-05-26 – 2018-06-05 (×20): 10 mL

## 2018-05-26 MED ORDER — INSULIN ASPART 100 UNIT/ML ~~LOC~~ SOLN
0.0000 [IU] | SUBCUTANEOUS | Status: DC
  Administered 2018-05-26 (×2): 1 [IU] via SUBCUTANEOUS
  Administered 2018-05-27 – 2018-05-29 (×5): 2 [IU] via SUBCUTANEOUS
  Administered 2018-05-29 (×2): 1 [IU] via SUBCUTANEOUS
  Administered 2018-05-30 (×2): 2 [IU] via SUBCUTANEOUS
  Administered 2018-05-30 (×3): 1 [IU] via SUBCUTANEOUS
  Administered 2018-05-31: 2 [IU] via SUBCUTANEOUS

## 2018-05-26 MED ORDER — POTASSIUM CHLORIDE 10 MEQ/100ML IV SOLN
10.0000 meq | INTRAVENOUS | Status: AC
  Administered 2018-05-26 (×2): 10 meq via INTRAVENOUS
  Filled 2018-05-26 (×2): qty 100

## 2018-05-26 MED ORDER — SODIUM CHLORIDE 0.9 % IV SOLN
2.0000 g | Freq: Two times a day (BID) | INTRAVENOUS | Status: DC
  Administered 2018-05-26 – 2018-05-27 (×3): 2 g via INTRAVENOUS
  Filled 2018-05-26 (×5): qty 2

## 2018-05-26 MED ORDER — SODIUM CHLORIDE 0.9% FLUSH: 10.0000 mL | INTRAVENOUS | Status: DC | PRN

## 2018-05-26 NOTE — Evaluation (Signed)
Clinical/Bedside Swallow Evaluation Patient Details  Name: Curtis Brock. MRN: [id number] Date of Birth: 1926/01/12  Today's Date: 05/26/2018 Time: SLP Start Time (ACUTE ONLY): 74 SLP Stop Time (ACUTE ONLY): 1050 SLP Time Calculation (min) (ACUTE ONLY): 30 min  Past Medical History:  Past Medical History:  Diagnosis Date  . Blind left eye    Past Surgical History:  Past Surgical History:  Procedure Laterality Date  . ABDOMINAL AORTOGRAM W/LOWER EXTREMITY N/A 10/02/2016   Procedure: ABDOMINAL AORTOGRAM W/LOWER EXTREMITY;  Surgeon: Waynetta Sandy, MD;  Location: [facility];  Service: Cardiovascular;  Laterality: N/A;  . PERIPHERAL VASCULAR BALLOON ANGIOPLASTY Left 10/02/2016   Procedure: PERIPHERAL VASCULAR BALLOON ANGIOPLASTY;  Surgeon: Waynetta Sandy, MD;  Location: [facility];  Service: Cardiovascular;  Laterality: Left;   HPI:  Patient is a 85 year old male with PMH:  DM, left eye blindness, PVD, hypertension, who presented to hospital with AMS, he was found to be in AKI, with SIRS, and with right upper chest shingles. Patient has h/o dysphagia with most recent MBS in 2018 and recommending Dys 2/3, thin liquids.   Assessment / Plan / Recommendation Clinical Impression  Patient presents with a mod-severe oropharyngeal dysphagia characterized by immediate coughing with thin liquids and delayed coughing and throat clearing with puree, nectar thick and honey thick consistencies. Patient with delayed oral transit, delayed swallow initiation and exhibited multiple swallows. Patient has h/o dysphagia which was suspected primary esophageal in nature. Based on patient's h/o dysphagia, currrent state of decreased alertness, and overt s/s of aspiration/penetration with all tested boluses, recommend to continue NPO statuus and SLP will follow for readiness to start PO's versus having objective swallow evaluation. SLP Visit Diagnosis: Dysphagia, unspecified  (R13.10)    Aspiration Risk  Severe aspiration risk    Diet Recommendation NPO   Medication Administration: Other (Comment)(necessary meds crushed in puree)    Other  Recommendations Oral Care Recommendations: Oral care BID   Follow up Recommendations 24 hour supervision/assistance;Skilled Nursing facility      Frequency and Duration min 2x/week  1 week       Prognosis Prognosis for Safe Diet Advancement: Fair Barriers to Reach Goals: Severity of deficits      Swallow Study   General Date of Onset: 05/24/18 HPI: Patient is a 85 year old male with PMH:  DM, left eye blindness, PVD, hypertension, who presented to hospital with AMS, he was found to be in AKI, with SIRS, and with right upper chest shingles. Patient has h/o dysphagia with most recent MBS in 2018 and recommending Dys 2/3, thin liquids. Type of Study: Bedside Swallow Evaluation Previous Swallow Assessment: MBS-2018: patient with osteophytes causing chronic dysphagia Diet Prior to this Study: NPO Temperature Spikes Noted: No History of Recent Intubation: No Behavior/Cognition: Lethargic/Drowsy;Pleasant mood;Confused;Requires cueing Oral Cavity Assessment: Within Functional Limits Oral Care Completed by SLP: Yes Oral Cavity - Dentition: Edentulous Self-Feeding Abilities: Total assist Patient Positioning: Upright in bed Baseline Vocal Quality: Low vocal intensity Volitional Cough: Cognitively unable to elicit Volitional Swallow: Unable to elicit    Oral/Motor/Sensory Function Overall Oral Motor/Sensory Function: Other (comment)(full assessment not completed due to patient only following limited commands) Facial Symmetry: Within Functional Limits Lingual ROM: Reduced right;Reduced left Lingual Symmetry: Within Functional Limits Lingual Strength: Reduced   Ice Chips     Thin Liquid Thin Liquid: Impaired Presentation: Cup;Straw Pharyngeal  Phase Impairments: Suspected delayed Swallow;Cough - Immediate;Cough -  Delayed    Nectar Thick Nectar Thick  Liquid: Impaired Presentation: Spoon Pharyngeal Phase Impairments: Suspected delayed Swallow;Throat Clearing - Delayed;Cough - Delayed   Honey Thick Honey Thick Liquid: Impaired Presentation: Spoon Pharyngeal Phase Impairments: Suspected delayed Swallow;Throat Clearing - Delayed;Cough - Delayed   Puree Puree: Impaired Pharyngeal Phase Impairments: Suspected delayed Swallow;Cough - Delayed   Solid     Solid: Not tested      Nadara Mode Tarrell 05/26/2018,12:25 PM   Sonia Baller, MA, CCC-SLP Speech Therapy [facility] Pager: [phone]

## 2018-05-26 NOTE — Progress Notes (Signed)
Text sent to Dr.Schorr re: Isolation needs to be Airborne/Contact for r/o Shingles. Pt is neg for CDif so doesn't need to be on Enteric Isolation.

## 2018-05-26 NOTE — Evaluation (Signed)
Physical Therapy Evaluation Patient Details Name: Curtis Brock. MRN: [id number] DOB: 12-31-1925 Today's Date: 05/26/2018   History of Present Illness  85 y.o. male admitted on 05/24/18 for AMS, hypothermia.  Pt dx with sepsis of unclear etiology (urine cultures pending, CXR does not show PNA), diarrhea (c diff negative), stage 2 sacral decubitus, hypokalemia, rash on upper chest wall suspicious for shingles.  Pt with significant PMH of L eye blindness, prior DM, PVD, L great toe ulceration s/p baloon angioplasty.    Clinical Impression  I attempted to contact pt's daughter on her cell and home phone listed in chart, but was unable to get a response.  This pt appears to be completely bed bound at baseline, but unable to confirm.  I also cannot get a sense of if she was managing him ok at home or if she would like to seek SNF placement upon d/c from here.  If he does go home I would like to speak to her about any equipment she may need.  He may also need ambulance transport if he returns home.   PT to follow acutely for deficits listed below.      Follow Up Recommendations SNF    Equipment Recommendations  Hospital bed;Other (comment)(air mattress overlay, ambulance transport if home, hoyer )    Recommendations for Other Services   NA    Precautions / Restrictions Precautions Precautions: Fall Precaution Comments: seemingly bed bound at baseline?      Mobility  Bed Mobility Overal bed mobility: Needs Assistance Bed Mobility: Rolling;Sidelying to Sit;Sit to Sidelying Rolling: Total assist Sidelying to sit: Total assist     Sit to sidelying: Total assist General bed mobility comments: Pt is total assist to roll bil for pericare and positioning at end of session.  He was able to come up to sitting EOB, but did not help (I used bed controls and bed pad mostly).  Once sitting he was better able to attempt to support himself and he did not resist movement to EOB.   Transfers                  General transfer comment: not attempted.  If he did transfer OOB to a WC he was likely total lift with a machine or a person picking him up.    Ambulation/Gait             General Gait Details: I suspect non-ambulatory, but I have not been able to contact his daughter.          Balance Overall balance assessment: Needs assistance Sitting-balance support: Feet supported;No upper extremity supported;Bilateral upper extremity supported Sitting balance-Leahy Scale: Poor Sitting balance - Comments: need mod assist to sit, but once EOB with feet supported, pt was able to attempt to stay upright, needed mod assist to maintain sitting EOB, but did seem to respond to me asking him to try to stay sitting up with some trunk reactions and UE reactions in sitting.  Pt tolerated EOB x 8 mins.                                     Pertinent Vitals/Pain Pain Assessment: Faces Faces Pain Scale: Hurts little more Pain Location: generalized with mobility. Pain Descriptors / Indicators: Discomfort Pain Intervention(s): Limited activity within patient's tolerance;Monitored during session;Repositioned    Home Living Family/patient expects to be discharged to:: Private residence Living Arrangements: Children(daughter  is listed in chart)               Additional Comments: Attempted to call daughter on cell and home number, but no one answered and I did not leave a message to try to assess his PLOF and to also assess if she is seeking SNF placement or planning to take him home and if home what equipment is needed.     Prior Function Level of Independence: Needs assistance   Gait / Transfers Assistance Needed: appears bed bound  ADL's / Homemaking Assistance Needed: appears total care           Extremity/Trunk Assessment   Upper Extremity Assessment Upper Extremity Assessment: RUE deficits/detail;LUE deficits/detail RUE Deficits / Details: bil UEs with flexion  contractures, has some limited wrist, elbow, shoulder ROM (<30 degrees at all joints) but not very functional.  I would guess he does not self feed.  LUE Deficits / Details: bil UEs with flexion contractures, has some limited wrist, elbow, shoulder ROM (<30 degrees at all joints) but not very functional.  I would guess he does not self feed.     Lower Extremity Assessment Lower Extremity Assessment: RLE deficits/detail;LLE deficits/detail RLE Deficits / Details: Pt has very good ankle DF ROM (greater than neutral) bil, but bil knee extension ROM is limited (lacking ~30 degrees from full extension) but appears equal bil.  LLE Deficits / Details: Pt has very good ankle DF ROM (greater than neutral) bil, but bil knee extension ROM is limited (lacking ~30 degrees from full extension) but appears equal bil.     Cervical / Trunk Assessment Cervical / Trunk Assessment: Other exceptions Cervical / Trunk Exceptions: Pt has a very rigid trunk with very little thoracic, lumbar or cervical mobility.    Communication      Cognition Arousal/Alertness: Awake/alert Behavior During Therapy: Flat affect Overall Cognitive Status: No family/caregiver present to determine baseline cognitive functioning                                 General Comments: No family to report baseline, but dementia listed in chart.  From what I have seen described his mentation seems close to baseline.       General Comments General comments (skin integrity, edema, etc.): Has sacral wound. Incontinent of bowels.        Assessment/Plan    PT Assessment Patient needs continued PT services  PT Problem List Decreased strength;Decreased range of motion;Decreased activity tolerance;Decreased balance;Decreased mobility;Decreased cognition;Decreased knowledge of use of DME;Pain;Decreased skin integrity       PT Treatment Interventions Functional mobility training;Therapeutic activities;Therapeutic exercise;Balance  training;Patient/family education;Cognitive remediation;Neuromuscular re-education;Wheelchair mobility training;Manual techniques    PT Goals (Current goals can be found in the Care Plan section)  Acute Rehab PT Goals Patient Stated Goal: unable to state PT Goal Formulation: Patient unable to participate in goal setting Time For Goal Achievement: 06/09/18 Potential to Achieve Goals: Good    Frequency Min 2X/week   Barriers to discharge Other (comment) unsure of home environment or family needs       AM-PAC PT "6 Clicks" Mobility  Outcome Measure Help needed turning from your back to your side while in a flat bed without using bedrails?: Total Help needed moving from lying on your back to sitting on the side of a flat bed without using bedrails?: Total Help needed moving to and from a bed to a chair (including a  wheelchair)?: Total Help needed standing up from a chair using your arms (e.g., wheelchair or bedside chair)?: Total Help needed to walk in hospital room?: Total Help needed climbing 3-5 steps with a railing? : Total 6 Click Score: 6    End of Session   Activity Tolerance: Patient tolerated treatment well Patient left: in bed;with call bell/phone within reach   PT Visit Diagnosis: Muscle weakness (generalized) (M62.81);Difficulty in walking, not elsewhere classified (R26.2)    Time: 7322-0254 PT Time Calculation (min) (ACUTE ONLY): 15 min   Charges:         Wells Guiles B. Donovin Kraemer, PT, DPT  Acute Rehabilitation #[phone] pager #[phone] office   PT Evaluation $PT Eval Moderate Complexity: 1 Mod         05/26/2018, 6:21 PM

## 2018-05-26 NOTE — Progress Notes (Addendum)
PROGRESS NOTE    Curtis Ly.  AJG:[id number] DOB: 04-05-1925 DOA: 05/24/2018 PCP: [facility]    Brief Narrative:  85 year old with prior h/o of DM, left eye blindness, PVD, hypertension, presents with AMS, he was found to be in AKI, with SIRS, and with right upper chest shingles.    Assessment & Plan:   Principal Problem:   Sepsis ([place]) Active Problems:   Essential hypertension, benign   Altered mental status   Diabetes type 2, controlled ([place])   Sacral decubitus ulcer   Acute renal failure syndrome ([facility])   Shingles   SEPSIS:  Unclear source. Lactate normalized.  CXR does not show pneumonia.  Urine cultures are pending.  Blood cultures have been negative so far.  Pt had diarrhea but GI pathogen PCR and c diff PCR is negative.  Currently empirically on IV vancomycin, cefepime and flagyl.  Check pro calcitonin level today.  Plan to de escalate antibiotics in 24 hours.    Essential Hypertension:  Well controlled.     AKI: Suspect pre renal etiology.  Following hydration, renal parameters are back to baseline.    Stage 2 sacral decubitus ulcer:  Wound care consulted and recommendations given.    Shingles:  Vesicular lesions on the right upper chest on admission, which had decreased in size, slightly crusted. They need to be covered.   Currently on IV acyclovir. Pt denies any pain over the chest wall.    Hypokalemia replaced, repeat levels in am.    Mild anemia of chronic disease:  Monitor hemoglobin.  Type 2 DM:  CBG (last 3)  Recent Labs    05/25/18 1656 05/26/18 0345 05/26/18 0827  GLUCAP 99 130* 119*   Diet controlled.  Resume SSI while inpatient.  Dietary consulted for recommendations.   DVT prophylaxis: lovenox.  Code Status: Full code.  Family Communication: none at bedside, will call family later today and update  Disposition Plan: pending clinical improvement and PT evaluation.    Consultants:  None.    Procedures: none.   Antimicrobials: IV vancomycin, cefepime and IV flagyl for sepsis and IV acyclovir for shingles.    Subjective: Pt minimally conversing.  Unclear what his baseline is.  Will need to work with PT/OT.  Pt denies any pain at this time.   Objective: Vitals:   05/25/18 2047 05/25/18 2343 05/26/18 0350 05/26/18 0817  BP: 134/80 134/80 (!) 179/99 125/82  Pulse: 91 (!) 101 91 (!) 108  Resp: 16 15 16  (!) 22  Temp: (!) 97.5 F (36.4 C) (!) 97.3 F (36.3 C) 97.6 F (36.4 C)   TempSrc: Axillary Oral Axillary   SpO2: 100% 99% 99% 100%  Weight:   65.7 kg     Intake/Output Summary (Last 24 hours) at 05/26/2018 0938 Last data filed at 05/26/2018 0700 Gross per 24 hour  Intake 2625 ml  Output --  Net 2625 ml   Filed Weights   05/24/18 1900 05/26/18 0350  Weight: 70.8 kg 65.7 kg    Examination:  General exam: Appears calm and comfortable not in any distress.  Respiratory system: Clear to auscultation. Respiratory effort normal. Cardiovascular system: S1 & S2 heard, RRR. No pedal edema. Gastrointestinal system: Abdomen is nondistended, soft and nontender. No organomegaly or masses felt. Normal bowel sounds heard. Central nervous system: Alert, but is not following commands. Minimally conversing. Extremities: no pedal edema.  Skin: stage 2 sacral decubitus ulcer.  Psychiatry: mood appropriate.  Data Reviewed: I have personally reviewed following labs and imaging studies  CBC: Recent Labs  Lab 05/24/18 1310 05/25/18 0653 05/26/18 0551  WBC 5.0 7.2 8.1  NEUTROABS 3.7  --   --   HGB 13.7 10.7* 11.8*  HCT 42.7 32.5* 35.1*  MCV 84.6 82.3 80.9  PLT 170 145* 329*   Basic Metabolic Panel: Recent Labs  Lab 05/24/18 1310 05/25/18 0653 05/26/18 0551  NA 136 135 138  K 3.7 3.1* 3.0*  CL 103 101 110  CO2 18* 23 19*  GLUCOSE 235* 265* 167*  BUN 27* 21 15  CREATININE 1.51* 1.02 0.88  CALCIUM 9.1 8.4* 8.6*  MG  --   --  1.7   GFR: CrCl cannot be  calculated (Unknown ideal weight.). Liver Function Tests: Recent Labs  Lab 05/24/18 1310  AST 23  ALT 14  ALKPHOS 54  BILITOT 0.7  PROT 5.8*  ALBUMIN 2.6*   No results for input(s): LIPASE, AMYLASE in the last 168 hours. No results for input(s): AMMONIA in the last 168 hours. Coagulation Profile: Recent Labs  Lab 05/24/18 1310  INR 1.3*   Cardiac Enzymes: No results for input(s): CKTOTAL, CKMB, CKMBINDEX, TROPONINI in the last 168 hours. BNP (last 3 results) No results for input(s): PROBNP in the last 8760 hours. HbA1C: No results for input(s): HGBA1C in the last 72 hours. CBG: Recent Labs  Lab 05/25/18 0913 05/25/18 1105 05/25/18 1656 05/26/18 0345 05/26/18 0827  GLUCAP 95 94 99 130* 119*   Lipid Profile: No results for input(s): CHOL, HDL, LDLCALC, TRIG, CHOLHDL, LDLDIRECT in the last 72 hours. Thyroid Function Tests: No results for input(s): TSH, T4TOTAL, FREET4, T3FREE, THYROIDAB in the last 72 hours. Anemia Panel: No results for input(s): VITAMINB12, FOLATE, FERRITIN, TIBC, IRON, RETICCTPCT in the last 72 hours. Sepsis Labs: Recent Labs  Lab 05/24/18 1310 05/24/18 1516 05/24/18 2124  PROCALCITON  --   --  1.69  LATICACIDVEN 3.9* 1.7  --     Recent Results (from the past 240 hour(s))  Culture, Urine     Status: None   Collection Time: 05/24/18  2:39 AM  Result Value Ref Range Status   Specimen Description URINE, RANDOM  Final   Special Requests NONE  Final   Culture   Final    NO GROWTH Performed at [facility], [place] [address], [place], [place] [address]    Report Status 05/25/2018 FINAL  Final  SARS Coronavirus 2 (CEPHEID - Performed in [place] hospital lab), Hosp Order     Status: None   Collection Time: 05/24/18 12:31 PM  Result Value Ref Range Status   SARS Coronavirus 2 NEGATIVE NEGATIVE Final    Comment: (NOTE) If result is NEGATIVE SARS-CoV-2 target nucleic acids are NOT DETECTED. The SARS-CoV-2 RNA is generally detectable in  upper and lower  respiratory specimens during the acute phase of infection. The lowest  concentration of SARS-CoV-2 viral copies this assay can detect is 250  copies / mL. A negative result does not preclude SARS-CoV-2 infection  and should not be used as the sole basis for treatment or other  patient management decisions.  A negative result may occur with  improper specimen collection / handling, submission of specimen other  than nasopharyngeal swab, presence of viral mutation(s) within the  areas targeted by this assay, and inadequate number of viral copies  (<250 copies / mL). A negative result must be combined with clinical  observations, patient history, and epidemiological information. If  result is POSITIVE SARS-CoV-2 target nucleic acids are DETECTED. The SARS-CoV-2 RNA is generally detectable in upper and lower  respiratory specimens dur ing the acute phase of infection.  Positive  results are indicative of active infection with SARS-CoV-2.  Clinical  correlation with patient history and other diagnostic information is  necessary to determine patient infection status.  Positive results do  not rule out bacterial infection or co-infection with other viruses. If result is PRESUMPTIVE POSTIVE SARS-CoV-2 nucleic acids MAY BE PRESENT.   A presumptive positive result was obtained on the submitted specimen  and confirmed on repeat testing.  While 2019 novel coronavirus  (SARS-CoV-2) nucleic acids may be present in the submitted sample  additional confirmatory testing may be necessary for epidemiological  and / or clinical management purposes  to differentiate between  SARS-CoV-2 and other Sarbecovirus currently known to infect humans.  If clinically indicated additional testing with an alternate test  methodology [phone]) is advised. The SARS-CoV-2 RNA is generally  detectable in upper and lower respiratory sp ecimens during the acute  phase of infection. The expected result is  Negative. Fact Sheet for Patients:  StrictlyIdeas.no Fact Sheet for Healthcare Providers: BankingDealers.co.za This test is not yet approved or cleared by the [place] FDA and has been authorized for detection and/or diagnosis of SARS-CoV-2 by FDA under an Emergency Use Authorization (EUA).  This EUA will remain in effect (meaning this test can be used) for the duration of the COVID-19 declaration under Section 564(b)(1) of the Act, 21 U.S.C. section 360bbb-3(b)(1), unless the authorization is terminated or revoked sooner. Performed at [facility], [place] [address], [place], [place] [address]   Blood Culture (routine x 2)     Status: None (Preliminary result)   Collection Time: 05/24/18  1:10 PM  Result Value Ref Range Status   Specimen Description BLOOD LEFT FOREARM  Final   Special Requests AEROBIC BOTTLE ONLY Blood Culture adequate volume  Final   Culture   Final    NO GROWTH < 24 HOURS Performed at [facility], [place] [address] [place], [place] [address]    Report Status PENDING  Incomplete  Blood Culture (routine x 2)     Status: None (Preliminary result)   Collection Time: 05/24/18  1:20 PM  Result Value Ref Range Status   Specimen Description BLOOD RIGHT WRIST  Final   Special Requests   Final    AEROBIC BOTTLE ONLY Blood Culture results may not be optimal due to an inadequate volume of blood received in culture bottles   Culture   Final    NO GROWTH < 24 HOURS Performed at [facility], [place] [address], [place], [place] [address]    Report Status PENDING  Incomplete  Gastrointestinal Panel by PCR , Stool     Status: None   Collection Time: 05/25/18  2:54 AM  Result Value Ref Range Status   Campylobacter species NOT DETECTED NOT DETECTED Final   Plesimonas shigelloides NOT DETECTED NOT DETECTED Final   Salmonella species NOT DETECTED NOT DETECTED Final   Yersinia enterocolitica NOT DETECTED NOT DETECTED  Final   Vibrio species NOT DETECTED NOT DETECTED Final   Vibrio cholerae NOT DETECTED NOT DETECTED Final   Enteroaggregative E coli (EAEC) NOT DETECTED NOT DETECTED Final   Enteropathogenic E coli (EPEC) NOT DETECTED NOT DETECTED Final   Enterotoxigenic E coli (ETEC) NOT DETECTED NOT DETECTED Final   Shiga like toxin producing E coli (STEC) NOT DETECTED  NOT DETECTED Final   Shigella/Enteroinvasive E coli (EIEC) NOT DETECTED NOT DETECTED Final   Cryptosporidium NOT DETECTED NOT DETECTED Final   Cyclospora cayetanensis NOT DETECTED NOT DETECTED Final   Entamoeba histolytica NOT DETECTED NOT DETECTED Final   Giardia lamblia NOT DETECTED NOT DETECTED Final   Adenovirus F40/41 NOT DETECTED NOT DETECTED Final   Astrovirus NOT DETECTED NOT DETECTED Final   Norovirus GI/GII NOT DETECTED NOT DETECTED Final   Rotavirus A NOT DETECTED NOT DETECTED Final   Sapovirus (I, II, IV, and V) NOT DETECTED NOT DETECTED Final    Comment: Performed at [facility], [place] [place], [place] [address]  C difficile quick scan w PCR reflex     Status: None   Collection Time: 05/25/18  2:54 AM  Result Value Ref Range Status   C Diff antigen NEGATIVE NEGATIVE Final   C Diff toxin NEGATIVE NEGATIVE Final   C Diff interpretation No C. difficile detected.  Final    Comment: Performed at [facility], [place] [address], [place], [place] [address]         Radiology Studies: Ct Head Wo Contrast  Result Date: 05/24/2018 CLINICAL DATA:  Unresponsive EXAM: CT HEAD WITHOUT CONTRAST TECHNIQUE: Contiguous axial images were obtained from the base of the skull through the vertex without intravenous contrast. COMPARISON:  CT brain, 10/16/2016 FINDINGS: Brain: No evidence of acute infarction, hemorrhage, hydrocephalus, extra-axial collection or mass lesion/mass effect. Extensive periventricular white matter hypodensity. Vascular: No hyperdense vessel or unexpected calcification. Skull: Normal. Negative for  fracture or focal lesion. Sinuses/Orbits: No acute finding. Other: None. IMPRESSION: No acute intracranial pathology.  Small-vessel white matter disease. Electronically Signed   By: Eddie Candle M.D.   On: 05/24/2018 15:53   Dg Chest Port 1 View  Result Date: 05/24/2018 CLINICAL DATA:  Hypothermia and altered mental status. EXAM: PORTABLE CHEST 1 VIEW COMPARISON:  05/13/2014 and prior radiographs FINDINGS: The cardiomediastinal silhouette is unremarkable. Elevated RIGHT hemidiaphragm again noted. There is no evidence of focal airspace disease, pulmonary edema, suspicious pulmonary nodule/mass, pleural effusion, or pneumothorax. No acute bony abnormalities are identified. IMPRESSION: No active disease. Electronically Signed   By: Margarette Canada M.D.   On: 05/24/2018 14:49        Scheduled Meds:  brimonidine  1 drop Right Eye BID   docusate sodium  100 mg Oral BID   enoxaparin (LOVENOX) injection  40 mg Subcutaneous Q24H   insulin aspart  0-9 Units Subcutaneous TID WC   potassium chloride  40 mEq Oral BID   sodium chloride flush  3 mL Intravenous Q12H   timolol  1 drop Right Eye Daily   Continuous Infusions:  acyclovir 700 mg (05/26/18 0429)   ceFEPime (MAXIPIME) IV 2 g (05/25/18 1705)   dextrose 5 % and 0.9% NaCl 75 mL/hr at 05/26/18 0700   lactated ringers 75 mL/hr at 05/24/18 2022   metronidazole 500 mg (05/26/18 0706)   vancomycin 1,000 mg (05/25/18 1701)     LOS: 2 days    Time spent: 36 minutes.     Hosie Poisson, MD [facility] Pager [phone]   If 7PM-7AM, please contact night-coverage [email or web address] Password [facility] 05/26/2018, 9:38 AM

## 2018-05-26 NOTE — Progress Notes (Signed)
Pharmacy Antibiotic Note  Curtis Brock. is a 85 y.o. male admitted on 05/24/2018 with concerns for sepsis.  Pharmacy has been consulted for vancomycin/cefepime dosing.   Height and weight updated. Renal function improved.  Plan: Vancomycin 1000 mg IV q24h,  AUC 484, SCr 1.02 Change cefepime to 2g IV q12h Acyclovir 700mg  IV q8h - shingles R upper chest rash Monitor clinical status, cultures, renal function, and LOT   Temp (24hrs), Avg:97.5 F (36.4 C), Min:97.3 F (36.3 C), Max:97.6 F (36.4 C)  Recent Labs  Lab 05/24/18 1310 05/24/18 1516 05/25/18 0653 05/26/18 0551  WBC 5.0  --  7.2 8.1  CREATININE 1.51*  --  1.02 0.88  LATICACIDVEN 3.9* 1.7  --   --     Estimated Creatinine Clearance: 50.3 mL/min (by C-G formula based on SCr of 0.88 mg/dL).    No Known Allergies  Antimicrobials this admission: Vancomycin 5/10 >> Cefepime 5/10 >> Metronidazole 5/10 >> 5/12 Acyclovir 5/10 >>  Dose adjustments this admission: 5/11 SCr 1.51 to 1.02, change Vanc 1250mg  q48 to 1gm q24  Microbiology results: 5/10 BCx: ngtd 5/10 covid: negative 5/10 UCx: negative 5/11 CDiff: neg/neg 511 GI panel: negative  Thank you for allowing pharmacy to be a part of this patient's care.  Renold Genta, PharmD, BCPS Clinical Pharmacist Clinical phone for 05/26/2018 until 8p is [phone] 05/26/2018 1:56 PM  **Pharmacist phone directory can now be found on [email or web address] listed under [place]**

## 2018-05-26 NOTE — Progress Notes (Signed)
Dr. Hilbert Bible placed order for Isolation type changed to Airborne/Contact

## 2018-05-26 NOTE — Progress Notes (Signed)
Initial Nutrition Assessment  INTERVENTION:   -Diet advancement per MD -If unable to have diet advanced and within [place], recommend nutrition support.  NUTRITION DIAGNOSIS:   Inadequate oral intake related to dysphagia as evidenced by NPO status.  GOAL:   Patient will meet greater than or equal to 90% of their needs  MONITOR:   Diet advancement, Weight trends, Labs, I & O's, Skin  REASON FOR ASSESSMENT:   Consult Assessment of nutrition requirement/status  ASSESSMENT:   85 year old with prior h/o of DM, left eye blindness, PVD, hypertension, presents with AMS, he was found to be in AKI, with SIRS, and with right upper chest shingles. Admitted for sepsis.  **RD working remotely**  Patient with AMS, oriented to person only per documentation. Noted that diet was ordered 5/10, now noted that diet was discontinued 5/11. Pt was evaluated 5/12 by SLP, pt with severe aspiration risk d/t moderate-severe dysphagia. Recommended pt remain NPO. If diet advanced, pt would benefit from nutritional supplements given pressure injury and poor PO.  Per weight records, pt has lost 11 lb since May 2019 (7% wt loss x 1 year, insignificant for time frame). Weights have been trending down since 2016.   Medications: D5 -.9% NaCl infusion at 75 ml/hr, IV Mg Sulfate Labs reviewed: CBGs: 119-141 Low K   NUTRITION - FOCUSED PHYSICAL EXAM:  Unable to perform per department requirements to work remotely.  Diet Order:   Diet Order    None      EDUCATION NEEDS:   Not appropriate for education at this time  Skin:  Skin Assessment: Skin Integrity Issues: Skin Integrity Issues:: Stage II Stage II: sacrum  Last BM:  5/12  Height:   Ht Readings from Last 1 Encounters:  06/13/17 5\' 6"  (1.676 m)    Weight:   Wt Readings from Last 1 Encounters:  05/26/18 65.7 kg    Ideal Body Weight:  64.5 kg(based on ht from 2019)  BMI:  Body mass index is 23.38 kg/m.  Estimated Nutritional Needs:    Kcal:  1600-1800  Protein:  75-85g  Fluid:  1.8L/day  Curtis Bibles, MS, RD, LDN [place] Dietitian Pager: [phone] After Hours Pager: [phone]

## 2018-05-27 LAB — BASIC METABOLIC PANEL
Anion gap: 8 (ref 5–15)
Anion gap: 9 (ref 5–15)
BUN: 12 mg/dL (ref 8–23)
BUN: 12 mg/dL (ref 8–23)
CO2: 23 mmol/L (ref 22–32)
CO2: 16 mmol/L — ABNORMAL LOW (ref 22–32)
Calcium: 8.4 mg/dL — ABNORMAL LOW (ref 8.9–10.3)
Calcium: 8.1 mg/dL — ABNORMAL LOW (ref 8.9–10.3)
Chloride: 108 mmol/L (ref 98–111)
Chloride: 112 mmol/L — ABNORMAL HIGH (ref 98–111)
Creatinine, Ser: 0.75 mg/dL (ref 0.61–1.24)
Creatinine, Ser: 0.71 mg/dL (ref 0.61–1.24)
GFR calc Af Amer: >60 mL/min (ref >60)
GFR calc Af Amer: >60 mL/min (ref >60)
GFR calc non Af Amer: >60 mL/min (ref >60)
GFR calc non Af Amer: >60 mL/min (ref >60)
Glucose, Bld: 135 mg/dL — ABNORMAL HIGH (ref 70–99)
Glucose, Bld: 210 mg/dL — ABNORMAL HIGH (ref 70–99)
Potassium: 2.7 mmol/L — CL (ref 3.5–5.1)
Potassium: 3.6 mmol/L (ref 3.5–5.1)
Sodium: 139 mmol/L (ref 135–145)
Sodium: 137 mmol/L (ref 135–145)

## 2018-05-27 LAB — CBC
HCT: 33.1 % — ABNORMAL LOW (ref 39.0–52.0)
Hemoglobin: 11.1 g/dL — ABNORMAL LOW (ref 13.0–17.0)
MCH: 27 pg (ref 26.0–34.0)
MCHC: 33.5 g/dL (ref 30.0–36.0)
MCV: 80.5 fL (ref 80.0–100.0)
Platelets: 141 10*3/uL — ABNORMAL LOW (ref 150–400)
RBC: 4.11 MIL/uL — ABNORMAL LOW (ref 4.22–5.81)
RDW: 13.2 % (ref 11.5–15.5)
WBC: 7.6 10*3/uL (ref 4.0–10.5)
nRBC: 0 % (ref 0.0–0.2)

## 2018-05-27 LAB — PROCALCITONIN: Procalcitonin: 0.36 ng/mL

## 2018-05-27 LAB — GLUCOSE, CAPILLARY
Glucose-Capillary: 107 mg/dL — ABNORMAL HIGH (ref 70–99)
Glucose-Capillary: 113 mg/dL — ABNORMAL HIGH (ref 70–99)
Glucose-Capillary: 118 mg/dL — ABNORMAL HIGH (ref 70–99)
Glucose-Capillary: 161 mg/dL — ABNORMAL HIGH (ref 70–99)
Glucose-Capillary: 110 mg/dL — ABNORMAL HIGH (ref 70–99)

## 2018-05-27 LAB — MAGNESIUM: Magnesium: 2 mg/dL (ref 1.7–2.4)

## 2018-05-27 MED ORDER — POTASSIUM CHLORIDE 10 MEQ/100ML IV SOLN
10.0000 meq | INTRAVENOUS | Status: AC
  Administered 2018-05-27 (×4): 10 meq via INTRAVENOUS
  Filled 2018-05-27 (×4): qty 100

## 2018-05-27 NOTE — Progress Notes (Signed)
PROGRESS NOTE    Curtis Brock.  YCX:[id number] DOB: 1925-07-04 DOA: 05/24/2018 PCP: [facility]   Brief Narrative:   85 year old with prior h/o of DM, left eye blindness, PVD, hypertension, presents with AMS, he was found to be in AKI, with SIRS, and with right upper chest shingles.    Assessment & Plan:   Principal Problem:   Sepsis ([place]) Active Problems:   Essential hypertension, benign   Altered mental status   Diabetes type 2, controlled ([place])   Sacral decubitus ulcer   Acute renal failure syndrome (H[facility])   Shingles   1. Sepsis      - Unclear source. Lactate normalized.      - CXR does not show pneumonia.      - UCx negative     - Blood cultures have been negative so far.      - Pt had diarrhea but GI pathogen PCR and c diff PCR is negative.      - Currently empirically on IV vancomycin, cefepime and flagyl.      - Procal 0.36      - with no clear source and negative procal, no clear indication of what we are treating  2. Essential Hypertension:      - stable off meds, monitor   3. AKI:     - resolved  4. Stage 2 sacral decubitus ulcer:      - Wound care consulted and recommendations given.   5. Shingles:      - Vesicular lesions on the right upper chest on admission, which had decreased in size, slightly crusted. They need to be covered.       - Currently on IV acyclovir.     - rash now dried and crusting over  6. Hypokalemia      - continue repletion; check labs  7. Mild anemia of chronic disease:      - Monitor hemoglobin.  8. Type 2 DM:      - Diet controlled.      - Resume SSI while inpatient.      - Dietary consulted for recommendations.    DVT prophylaxis: Lovenox Code Status: FULL Family Communication: None   Disposition Plan: TBD   Consultants:   None  Antimicrobials:  . Cefepime, Vancomycin, Flagyl, acyclovir    Subjective: Shingles rash improving. No events ON otherwise  Objective: Vitals:    05/27/18 0329 05/27/18 0330 05/27/18 0331 05/27/18 0839  BP:    110/75  Pulse: 93 93 91 (!) 102  Resp: (!) 21 (!) 21 19   Temp:  (!) 96.4 F (35.8 C)  98.2 F (36.8 C)  TempSrc:  Axillary  Axillary  SpO2: 100% 100% 100% 98%  Weight:      Height:        Intake/Output Summary (Last 24 hours) at 05/27/2018 1709 Last data filed at 05/27/2018 1500 Gross per 24 hour  Intake 3006.42 ml  Output 500 ml  Net 2506.42 ml   Filed Weights   05/26/18 0350 05/26/18 1300 05/26/18 2354  Weight: 65.7 kg 65.7 kg 68.6 kg    Examination:  General exam: 85 y.o. male Appears calm and comfortable  Respiratory system: Clear to auscultation. Respiratory effort normal. Cardiovascular system: S1 & S2 heard, RRR. No JVD, murmurs, rubs, gallops or clicks. No pedal edema. Gastrointestinal system: Abdomen is nondistended, soft and nontender. No organomegaly or masses felt. Normal bowel sounds heard. Extremities: Symmetric 5 x 5  power. Skin: right shoulder/upper chest rash c/w shingles dry at this time    Data Reviewed: I have personally reviewed following labs and imaging studies.  CBC: Recent Labs  Lab 05/24/18 1310 05/25/18 0653 05/26/18 0551 05/27/18 0435  WBC 5.0 7.2 8.1 7.6  NEUTROABS 3.7  --   --   --   HGB 13.7 10.7* 11.8* 11.1*  HCT 42.7 32.5* 35.1* 33.1*  MCV 84.6 82.3 80.9 80.5  PLT 170 145* 136* 010*   Basic Metabolic Panel: Recent Labs  Lab 05/24/18 1310 05/25/18 0653 05/26/18 0551 05/27/18 0435  NA 136 135 138 139  K 3.7 3.1* 3.0* 2.7*  CL 103 101 110 108  CO2 18* 23 19* 23  GLUCOSE 235* 265* 167* 135*  BUN 27* 21 15 12   CREATININE 1.51* 1.02 0.88 0.75  CALCIUM 9.1 8.4* 8.6* 8.4*  MG  --   --  1.7 2.0   GFR: Estimated Creatinine Clearance: 55.4 mL/min (by C-G formula based on SCr of 0.75 mg/dL). Liver Function Tests: Recent Labs  Lab 05/24/18 1310  AST 23  ALT 14  ALKPHOS 54  BILITOT 0.7  PROT 5.8*  ALBUMIN 2.6*   No results for input(s): LIPASE, AMYLASE in  the last 168 hours. No results for input(s): AMMONIA in the last 168 hours. Coagulation Profile: Recent Labs  Lab 05/24/18 1310  INR 1.3*   Cardiac Enzymes: No results for input(s): CKTOTAL, CKMB, CKMBINDEX, TROPONINI in the last 168 hours. BNP (last 3 results) No results for input(s): PROBNP in the last 8760 hours. HbA1C: No results for input(s): HGBA1C in the last 72 hours. CBG: Recent Labs  Lab 05/26/18 1950 05/27/18 0100 05/27/18 0346 05/27/18 0833 05/27/18 1316  GLUCAP 140* 107* 113* 118* 161*   Lipid Profile: No results for input(s): CHOL, HDL, LDLCALC, TRIG, CHOLHDL, LDLDIRECT in the last 72 hours. Thyroid Function Tests: No results for input(s): TSH, T4TOTAL, FREET4, T3FREE, THYROIDAB in the last 72 hours. Anemia Panel: No results for input(s): VITAMINB12, FOLATE, FERRITIN, TIBC, IRON, RETICCTPCT in the last 72 hours. Sepsis Labs: Recent Labs  Lab 05/24/18 1310 05/24/18 1516 05/24/18 2124 05/26/18 0551 05/27/18 0435  PROCALCITON  --   --  1.69 0.60 0.36  LATICACIDVEN 3.9* 1.7  --   --   --     Recent Results (from the past 240 hour(s))  Culture, Urine     Status: None   Collection Time: 05/24/18  2:39 AM  Result Value Ref Range Status   Specimen Description URINE, RANDOM  Final   Special Requests NONE  Final   Culture   Final    NO GROWTH Performed at [facility], [place] [address] [place], [place] [address]    Report Status 05/25/2018 FINAL  Final  SARS Coronavirus 2 (CEPHEID - Performed in [facility] lab), Hosp Order     Status: None   Collection Time: 05/24/18 12:31 PM  Result Value Ref Range Status   SARS Coronavirus 2 NEGATIVE NEGATIVE Final    Comment: (NOTE) If result is NEGATIVE SARS-CoV-2 target nucleic acids are NOT DETECTED. The SARS-CoV-2 RNA is generally detectable in upper and lower  respiratory specimens during the acute phase of infection. The lowest  concentration of SARS-CoV-2 viral copies this assay can detect is  250  copies / mL. A negative result does not preclude SARS-CoV-2 infection  and should not be used as the sole basis for treatment or other  patient management decisions.  A negative result may  occur with  improper specimen collection / handling, submission of specimen other  than nasopharyngeal swab, presence of viral mutation(s) within the  areas targeted by this assay, and inadequate number of viral copies  (<250 copies / mL). A negative result must be combined with clinical  observations, patient history, and epidemiological information. If result is POSITIVE SARS-CoV-2 target nucleic acids are DETECTED. The SARS-CoV-2 RNA is generally detectable in upper and lower  respiratory specimens dur ing the acute phase of infection.  Positive  results are indicative of active infection with SARS-CoV-2.  Clinical  correlation with patient history and other diagnostic information is  necessary to determine patient infection status.  Positive results do  not rule out bacterial infection or co-infection with other viruses. If result is PRESUMPTIVE POSTIVE SARS-CoV-2 nucleic acids MAY BE PRESENT.   A presumptive positive result was obtained on the submitted specimen  and confirmed on repeat testing.  While 2019 novel coronavirus  (SARS-CoV-2) nucleic acids may be present in the submitted sample  additional confirmatory testing may be necessary for epidemiological  and / or clinical management purposes  to differentiate between  SARS-CoV-2 and other Sarbecovirus currently known to infect humans.  If clinically indicated additional testing with an alternate test  methodology [phone]) is advised. The SARS-CoV-2 RNA is generally  detectable in upper and lower respiratory sp ecimens during the acute  phase of infection. The expected result is Negative. Fact Sheet for Patients:  StrictlyIdeas.no Fact Sheet for Healthcare Providers:  BankingDealers.co.za This test is not yet approved or cleared by the [place] FDA and has been authorized for detection and/or diagnosis of SARS-CoV-2 by FDA under an Emergency Use Authorization (EUA).  This EUA will remain in effect (meaning this test can be used) for the duration of the COVID-19 declaration under Section 564(b)(1) of the Act, 21 U.S.C. section 360bbb-3(b)(1), unless the authorization is terminated or revoked sooner. Performed at [facility], [place] [address], [place], [place] [address]   Blood Culture (routine x 2)     Status: None (Preliminary result)   Collection Time: 05/24/18  1:10 PM  Result Value Ref Range Status   Specimen Description BLOOD LEFT FOREARM  Final   Special Requests AEROBIC BOTTLE ONLY Blood Culture adequate volume  Final   Culture   Final    NO GROWTH 3 DAYS Performed at [facility], [place] [address], [place], [place] [address]    Report Status PENDING  Incomplete  Blood Culture (routine x 2)     Status: None (Preliminary result)   Collection Time: 05/24/18  1:20 PM  Result Value Ref Range Status   Specimen Description BLOOD RIGHT WRIST  Final   Special Requests   Final    AEROBIC BOTTLE ONLY Blood Culture results may not be optimal due to an inadequate volume of blood received in culture bottles   Culture   Final    NO GROWTH 3 DAYS Performed at [facility], [place] [address], [place], [place] [address]    Report Status PENDING  Incomplete  Gastrointestinal Panel by PCR , Stool     Status: None   Collection Time: 05/25/18  2:54 AM  Result Value Ref Range Status   Campylobacter species NOT DETECTED NOT DETECTED Final   Plesimonas shigelloides NOT DETECTED NOT DETECTED Final   Salmonella species NOT DETECTED NOT DETECTED Final   Yersinia enterocolitica NOT DETECTED NOT DETECTED Final   Vibrio species NOT DETECTED NOT DETECTED Final  Vibrio cholerae NOT DETECTED NOT DETECTED Final    Enteroaggregative E coli (EAEC) NOT DETECTED NOT DETECTED Final   Enteropathogenic E coli (EPEC) NOT DETECTED NOT DETECTED Final   Enterotoxigenic E coli (ETEC) NOT DETECTED NOT DETECTED Final   Shiga like toxin producing E coli (STEC) NOT DETECTED NOT DETECTED Final   Shigella/Enteroinvasive E coli (EIEC) NOT DETECTED NOT DETECTED Final   Cryptosporidium NOT DETECTED NOT DETECTED Final   Cyclospora cayetanensis NOT DETECTED NOT DETECTED Final   Entamoeba histolytica NOT DETECTED NOT DETECTED Final   Giardia lamblia NOT DETECTED NOT DETECTED Final   Adenovirus F40/41 NOT DETECTED NOT DETECTED Final   Astrovirus NOT DETECTED NOT DETECTED Final   Norovirus GI/GII NOT DETECTED NOT DETECTED Final   Rotavirus A NOT DETECTED NOT DETECTED Final   Sapovirus (I, II, IV, and V) NOT DETECTED NOT DETECTED Final    Comment: Performed at [facility], [place] [place], [place] [address]  C difficile quick scan w PCR reflex     Status: None   Collection Time: 05/25/18  2:54 AM  Result Value Ref Range Status   C Diff antigen NEGATIVE NEGATIVE Final   C Diff toxin NEGATIVE NEGATIVE Final   C Diff interpretation No C. difficile detected.  Final    Comment: Performed at [facility], [place] [address], [place], [place] [address]         Radiology Studies: No results found.      Scheduled Meds: . aspirin  81 mg Oral Daily  . brimonidine  1 drop Right Eye BID  . chlorhexidine  15 mL Mouth Rinse BID  . enoxaparin (LOVENOX) injection  40 mg Subcutaneous Q24H  . insulin aspart  0-9 Units Subcutaneous Q4H  . mouth rinse  15 mL Mouth Rinse q12n4p  . sodium chloride flush  10-40 mL Intracatheter Q12H  . sodium chloride flush  3 mL Intravenous Q12H  . timolol  1 drop Right Eye Daily   Continuous Infusions: . acyclovir 700 mg (05/27/18 1321)  . ceFEPime (MAXIPIME) IV 2 g (05/27/18 1121)  . dextrose 5 % and 0.9% NaCl 75 mL/hr at 05/27/18 0300  . vancomycin Stopped (05/26/18  1821)     LOS: 3 days    Time spent: 25 minutes spent in the coordination of care today.    Jonnie Finner, DO [facility] Pager [phone]  If 7PM-7AM, please contact night-coverage [email or web address] Password [facility] 05/27/2018, 5:09 PM

## 2018-05-27 NOTE — Progress Notes (Signed)
Sent text to Dr.Schorr this morning's K+ is 2.7

## 2018-05-27 NOTE — Progress Notes (Signed)
  Speech Language Pathology Treatment: Dysphagia  Patient Details Name: Curtis Brock. MRN: [id number] DOB: May 25, 1925 Today's Date: 05/27/2018 Time: 5909-3112 SLP Time Calculation (min) (ACUTE ONLY): 13 min  Assessment / Plan / Recommendation Clinical Impression  Pt demonstrates ongoing signs of dysphagia including pumping and mastication of pureed textures, mulitple swallows of liquids in particular and delayed cough response. In 2018 pt seen to have a few anatomical differences which would not have changed: cervical osteophytes and outpuching in the distal esophagus. His current CXR did not shows any sign of airspace consolidation on report. He is currently unable to particiapte in objective testing due to droplet precautiosns for shingles.   Overall, pt is capable of swallowing purees and liquids, though not without some risk of aspiration, particularly given history of dysphagia, esophageal impairment and possible acute decompensation. However, given pts age and need for nutrition and limited options for objective testing, recommend initiating a puree and nectar thick consistency to possibly slow the bolus and reduce risk in the short term. Will follow for tolerance and advancement.      HPI HPI: Patient is a 85 year old male with PMH:  DM, left eye blindness, PVD, hypertension, who presented to hospital with AMS, he was found to be in AKI, with SIRS, and with right upper chest shingles. Patient has h/o dysphagia with most recent MBS in 2018 and recommending Dys 2/3, thin liquids.      SLP Plan  Continue with current plan of care       Recommendations  Diet recommendations: Nectar-thick liquid;Dysphagia 1 (puree) Liquids provided via: Cup;Straw Medication Administration: Crushed with puree Supervision: Staff to assist with self feeding;Full supervision/cueing for compensatory strategies Compensations: Minimize environmental distractions;Slow rate;Small sips/bites Postural Changes  and/or Swallow Maneuvers: Seated upright 90 degrees                Oral Care Recommendations: Oral care BID Follow up Recommendations: 24 hour supervision/assistance;Skilled Nursing facility SLP Visit Diagnosis: Dysphagia, unspecified (R13.10) Plan: Continue with current plan of care       GO               Curtis [place], MA [facility] Pager [phone] Office [phone]  Curtis Brock 05/27/2018, 9:58 AM

## 2018-05-27 NOTE — Evaluation (Signed)
Occupational Therapy Evaluation Patient Details Name: Curtis Brock. MRN: [id number] DOB: 1925-03-02 Today's Date: 05/27/2018    History of Present Illness 85 y.o. male admitted on 05/24/18 for AMS, hypothermia.  Pt dx with sepsis of unclear etiology (urine cultures pending, CXR does not show PNA), diarrhea (c diff negative), stage 2 sacral decubitus, hypokalemia, rash on upper chest wall suspicious for shingles.  Pt with significant PMH of L eye blindness, prior DM, PVD, L great toe ulceration s/p baloon angioplasty.     Clinical Impression   Pt PTA:   Per phone call with daughter 5/13: He was transferring to chair with assist +1, using RW for transfers and minimal mobility; pt was talkative; bath given in bed by daughter; pt required assist with all ADL.He has RW and transport chair. He has steps to get into home, but "we don't let him use them." Pt does not use commode so family changes his depends. Pt's daughter was not very talkative so I was able to gather limited information. Currently, pt was keeping eyes shut and not opening them to sound or stimulus. Pt grunted when he heard his name. Pt totalA for bed mobility and sitting EOB requiring intermittent assist for stability. Pt totalA for ADL at this time. Pt would benefit from continued OT skilled services for ADL, mobility and safety in SNF setting. OT to follow acutely.   Follow Up Recommendations  SNF;Supervision/Assistance - 24 hour    Equipment Recommendations  Other (comment)(to be determined)    Recommendations for Other Services       Precautions / Restrictions Precautions Precautions: Fall Restrictions Weight Bearing Restrictions: No      Mobility Bed Mobility Overal bed mobility: Needs Assistance Bed Mobility: Rolling;Sidelying to Sit;Sit to Sidelying Rolling: Total assist Sidelying to sit: Total assist     Sit to sidelying: Total assist General bed mobility comments: Pt is total assist to roll bil for pericare  and positioning at end of session.  He was able to come up to sitting EOB, but did not help (I used bed controls and bed pad mostly).  Once sitting he was better able to attempt to support himself. Pt with flexed posture in sitting  Transfers Overall transfer level: Needs assistance               General transfer comment: NT. Pt would be maximove or totalA for transfer    Balance Overall balance assessment: Needs assistance Sitting-balance support: Feet supported;Bilateral upper extremity supported Sitting balance-Leahy Scale: Poor Sitting balance - Comments: modA for sitting balance; pt with minimal trunk reactions as pt leaning to L side to begin supine position                                   ADL either performed or assessed with clinical judgement   ADL Overall ADL's : Needs assistance/impaired Eating/Feeding: NPO   Grooming: Total assistance Grooming Details (indicate cue type and reason): closing his eyes shut not attempting to assist Upper Body Bathing: Total assistance   Lower Body Bathing: Total assistance   Upper Body Dressing : Total assistance   Lower Body Dressing: Total assistance   Toilet Transfer: Total assistance   Toileting- Clothing Manipulation and Hygiene: Total assistance   Tub/ Shower Transfer: Total assistance   Functional mobility during ADLs: Total assistance General ADL Comments: Pt closed eyes shut when OTR attempting to wash eye crust out of eyes.  Pt did not arouse to stimulus.     Vision Baseline Vision/History: Wears glasses Vision Assessment?: Vision impaired- to be further tested in functional context Additional Comments: Pt would not open eyes to assess any visual tracking     Perception     Praxis      Pertinent Vitals/Pain Pain Assessment: Faces Faces Pain Scale: Hurts little more Pain Location: generalized with mobility. Pain Descriptors / Indicators: Discomfort Pain Intervention(s): Repositioned;Limited  activity within patient's tolerance     Hand Dominance     Extremity/Trunk Assessment Upper Extremity Assessment Upper Extremity Assessment: Generalized weakness RUE Deficits / Details: bil UEs with flexion contractures, has some limited wrist, elbow, shoulder ROM (<30 degrees at all joints) but not very functional.  I would guess he does not self feed.  LUE Deficits / Details: bil UEs with flexion contractures, has some limited wrist, elbow, shoulder ROM (<30 degrees at all joints) but not very functional.  I would guess he does not self feed.    Lower Extremity Assessment Lower Extremity Assessment: Defer to PT evaluation;RLE deficits/detail   Cervical / Trunk Assessment Cervical / Trunk Assessment: Other exceptions Cervical / Trunk Exceptions: Pt has a very rigid trunk with very little thoracic, lumbar or cervical mobility.     Communication Communication Communication: Other (comment)(pt did not speak during eval)   Cognition Arousal/Alertness: Awake/alert Behavior During Therapy: Flat affect Overall Cognitive Status: No family/caregiver present to determine baseline cognitive functioning                                 General Comments: No family to report baseline, but dementia listed in chart.  From what I have seen described his mentation seems close to baseline.    General Comments  sacral wound; incontinent of bowel and catheter for bladder. pt 's pad was requiring changing so NT assist with rolling for new pad     Exercises     Shoulder Instructions      Home Living Family/patient expects to be discharged to:: Private residence Living Arrangements: Children Available Help at Discharge: Family;Available 24 hours/day Type of Home: House Home Access: Stairs to enter                     Home Equipment: Walker - 2 wheels;Transport chair   Additional Comments: Per phone call with daughter 5/13: He was transferring to chair with assist +1, using  RW for transfers and minimal mobility; pt was talkative; bath given in bed by daughter; pt required assist with all ADL.He has RW and transport chair. He has steps to get into home, but "we don't let him use them." Pt does not use commode, then change his depends.      Prior Functioning/Environment Level of Independence: Needs assistance  Gait / Transfers Assistance Needed: as using RW and +1 assist OOB to chair; transport chair for community level ADL's / Homemaking Assistance Needed: requires maxA to total care for all needs Communication / Swallowing Assistance Needed: Pt talkative at home          OT Problem List: Decreased strength;Decreased range of motion;Decreased coordination;Decreased safety awareness;Pain;Impaired UE functional use      OT Treatment/Interventions: Self-care/ADL training;Therapeutic exercise;Neuromuscular education;Energy conservation;Therapeutic activities;Patient/family education;Balance training    OT Goals(Current goals can be found in the care plan section) Acute Rehab OT Goals Patient Stated Goal: unable to state OT Goal Formulation: With patient Time For  Goal Achievement: 06/10/18 Potential to Achieve Goals: Good ADL Goals Pt Will Perform Grooming: with max assist;sitting Pt Will Perform Upper Body Dressing: with max assist;sitting Pt/caregiver will Perform Home Exercise Program: Increased ROM;Increased strength;Both right and left upper extremity Additional ADL Goal #1: Pt will tolerate EOB x10 mins for ADL task wth fair balance Additional ADL Goal #2: Pt will perform ADL functional transfer with maxA  OT Frequency: Min 2X/week   Barriers to D/C:            Co-evaluation              AM-PAC OT "6 Clicks" Daily Activity     Outcome Measure Help from another person eating meals?: Total Help from another person taking care of personal grooming?: Total Help from another person toileting, which includes using toliet, bedpan, or urinal?:  Total Help from another person bathing (including washing, rinsing, drying)?: Total Help from another person to put on and taking off regular upper body clothing?: Total Help from another person to put on and taking off regular lower body clothing?: Total 6 Click Score: 6   End of Session Nurse Communication: Mobility status  Activity Tolerance: Patient limited by lethargy;Patient limited by fatigue;Treatment limited secondary to medical complications (Comment) Patient left: in chair;with call bell/phone within reach;with bed alarm set  OT Visit Diagnosis: Unsteadiness on feet (R26.81);Muscle weakness (generalized) (M62.81);Other symptoms and signs involving cognitive function                Time: 1886-7737 OT Time Calculation (min): 32 min Charges:  OT General Charges $OT Visit: 1 Visit OT Evaluation $OT Eval Moderate Complexity: 1 Mod OT Treatments $Therapeutic Activity: 8-22 mins  Ebony Hail Harold Hedge) Marsa Aris OTR/L Acute Rehabilitation Services Pager: [phone] Office: [place] 05/27/2018, 11:24 AM

## 2018-05-28 LAB — RENAL FUNCTION PANEL
Albumin: 2.1 g/dL — ABNORMAL LOW (ref 3.5–5.0)
Anion gap: 9 (ref 5–15)
BUN: 10 mg/dL (ref 8–23)
CO2: 19 mmol/L — ABNORMAL LOW (ref 22–32)
Calcium: 8.5 mg/dL — ABNORMAL LOW (ref 8.9–10.3)
Chloride: 111 mmol/L (ref 98–111)
Creatinine, Ser: 0.71 mg/dL (ref 0.61–1.24)
GFR calc Af Amer: >60 mL/min (ref >60)
GFR calc non Af Amer: >60 mL/min (ref >60)
Glucose, Bld: 114 mg/dL — ABNORMAL HIGH (ref 70–99)
Phosphorus: 1.1 mg/dL — ABNORMAL LOW (ref 2.5–4.6)
Potassium: 2.8 mmol/L — ABNORMAL LOW (ref 3.5–5.1)
Sodium: 139 mmol/L (ref 135–145)

## 2018-05-28 LAB — MAGNESIUM: Magnesium: 1.8 mg/dL (ref 1.7–2.4)

## 2018-05-28 LAB — CBC WITH DIFFERENTIAL/PLATELET
Abs Immature Granulocytes: 0.03 10*3/uL (ref 0.00–0.07)
Basophils Absolute: 0 10*3/uL (ref 0.0–0.1)
Basophils Relative: 1 %
Eosinophils Absolute: 0.2 10*3/uL (ref 0.0–0.5)
Eosinophils Relative: 4 %
HCT: 36.3 % — ABNORMAL LOW (ref 39.0–52.0)
Hemoglobin: 12.5 g/dL — ABNORMAL LOW (ref 13.0–17.0)
Immature Granulocytes: 1 %
Lymphocytes Relative: 18 %
Lymphs Abs: 1.1 10*3/uL (ref 0.7–4.0)
MCH: 27.3 pg (ref 26.0–34.0)
MCHC: 34.4 g/dL (ref 30.0–36.0)
MCV: 79.3 fL — ABNORMAL LOW (ref 80.0–100.0)
Monocytes Absolute: 0.5 10*3/uL (ref 0.1–1.0)
Monocytes Relative: 8 %
Neutro Abs: 4.3 10*3/uL (ref 1.7–7.7)
Neutrophils Relative %: 68 %
Platelets: 142 10*3/uL — ABNORMAL LOW (ref 150–400)
RBC: 4.58 MIL/uL (ref 4.22–5.81)
RDW: 13.2 % (ref 11.5–15.5)
WBC: 6.2 10*3/uL (ref 4.0–10.5)
nRBC: 0 % (ref 0.0–0.2)

## 2018-05-28 LAB — BASIC METABOLIC PANEL
Anion gap: 8 (ref 5–15)
BUN: 10 mg/dL (ref 8–23)
CO2: 20 mmol/L — ABNORMAL LOW (ref 22–32)
Calcium: 8 mg/dL — ABNORMAL LOW (ref 8.9–10.3)
Chloride: 110 mmol/L (ref 98–111)
Creatinine, Ser: 0.85 mg/dL (ref 0.61–1.24)
GFR calc Af Amer: >60 mL/min (ref >60)
GFR calc non Af Amer: >60 mL/min (ref >60)
Glucose, Bld: 194 mg/dL — ABNORMAL HIGH (ref 70–99)
Potassium: 3.1 mmol/L — ABNORMAL LOW (ref 3.5–5.1)
Sodium: 138 mmol/L (ref 135–145)

## 2018-05-28 LAB — GLUCOSE, CAPILLARY
Glucose-Capillary: 112 mg/dL — ABNORMAL HIGH (ref 70–99)
Glucose-Capillary: 120 mg/dL — ABNORMAL HIGH (ref 70–99)
Glucose-Capillary: 132 mg/dL — ABNORMAL HIGH (ref 70–99)
Glucose-Capillary: 153 mg/dL — ABNORMAL HIGH (ref 70–99)
Glucose-Capillary: 164 mg/dL — ABNORMAL HIGH (ref 70–99)
Glucose-Capillary: 187 mg/dL — ABNORMAL HIGH (ref 70–99)
Glucose-Capillary: 81 mg/dL (ref 70–99)
Glucose-Capillary: 90 mg/dL (ref 70–99)

## 2018-05-28 LAB — PROCALCITONIN: Procalcitonin: 0.31 ng/mL

## 2018-05-28 MED ORDER — POTASSIUM PHOSPHATES 15 MMOLE/5ML IV SOLN
40.0000 meq | Freq: Once | INTRAVENOUS | Status: AC
  Administered 2018-05-28: 40 meq via INTRAVENOUS
  Filled 2018-05-28: qty 9.09

## 2018-05-28 NOTE — Progress Notes (Signed)
  Speech Language Pathology Treatment: Dysphagia  Patient Details Name: Curtis Brock. MRN: [id number] DOB: 1925/08/06 Today's Date: 05/28/2018 Time: 5927-6394 SLP Time Calculation (min) (ACUTE ONLY): 8 min  Assessment / Plan / Recommendation Clinical Impression  Pt appears unchanged in participation and awareness. He appears to have been given a small amount of lunch tray. When offered nectar thick water he accepted and took straw sips with total assist feeding. Oral holding with appearance of piece-mealing observed, but no coughing this session. No wet vocal quality. Pt may be tolerating diet to best of his ability at this time. Will f/u next week to determine if any observable improvement warrants diet upgrade.   HPI HPI: Patient is a 85 year old male with PMH:  DM, left eye blindness, PVD, hypertension, who presented to hospital with AMS, he was found to be in AKI, with SIRS, and with right upper chest shingles. Patient has h/o dysphagia with most recent MBS in 2018 and recommending Dys 2/3, thin liquids.      SLP Plan  Continue with current plan of care       Recommendations  Diet recommendations: Dysphagia 1 (puree);Nectar-thick liquid Liquids provided via: Straw Medication Administration: Crushed with puree Supervision: Staff to assist with self feeding;Full supervision/cueing for compensatory strategies Compensations: Minimize environmental distractions;Slow rate;Small sips/bites Postural Changes and/or Swallow Maneuvers: Seated upright 90 degrees                Oral Care Recommendations: Oral care BID Follow up Recommendations: 24 hour supervision/assistance;Skilled Nursing facility SLP Visit Diagnosis: Dysphagia, unspecified (R13.10) Plan: Continue with current plan of care       GO               Herbie [place], MA [place] Pager [phone] Office [phone]  Lynann Beaver 05/28/2018, 1:48 PM

## 2018-05-28 NOTE — Progress Notes (Signed)
Nutrition Follow-up  INTERVENTION:   Provide Vital Cuisine shake daily, provides 520 kcal and 22g protein -is nectar thick   NUTRITION DIAGNOSIS:   Inadequate oral intake related to dysphagia as evidenced by NPO status.  Now on dysphagia 1 diet with nectar thick liquids.  GOAL:   Patient will meet greater than or equal to 90% of their needs  Not meeting.  MONITOR:   PO intake, Supplement acceptance, Labs, Weight trends, I & O's  ASSESSMENT:   85 year old with prior h/o of DM, left eye blindness, PVD, hypertension, presents with AMS, he was found to be in AKI, with SIRS, and with right upper chest shingles. Admitted for sepsis.  **RD working remotely**  Patient re-evaluated by SLP on 5/13, recommends dysphagia 1 diet with nectar thick liquids. Pt requiring feeding assistance. Per SLP note today, pt had a small amount of lunch tray today. Would likely benefit from protein supplementation. Will order Vital Cuisine shakes since they are nectar thick and hypercaloric.   Medications: D5 -.9% NaCl infusion at 75 ml/hr, K-PHOS infusion,  Labs reviewed: CBGs: 90-112 Low K, Phos Mg WNL  Diet Order:   Diet Order            DIET - DYS 1 Room service appropriate? Yes with Assist; Fluid consistency: Nectar Thick  Diet effective now              EDUCATION NEEDS:   Not appropriate for education at this time  Skin:  Skin Assessment: Skin Integrity Issues: Skin Integrity Issues:: Stage II Stage II: sacrum  Last BM:  5/13  Height:   Ht Readings from Last 1 Encounters:  05/26/18 5\' 6"  (1.676 m)    Weight:   Wt Readings from Last 1 Encounters:  05/26/18 68.6 kg    Ideal Body Weight:  64.5 kg(based on ht from 2016)  BMI:  Body mass index is 24.41 kg/m.  Estimated Nutritional Needs:   Kcal:  1600-1800  Protein:  75-85g  Fluid:  1.8L/day  Curtis Bibles, MS, RD, LDN [facility] Pager: [phone] After Hours Pager: [phone]

## 2018-05-28 NOTE — Progress Notes (Signed)
Physical Therapy Treatment Patient Details Name: Curtis Brock. MRN: [id number] DOB: [birth year] Today's Date: 05/28/2018    History of Present Illness 85 y.o. male admitted on 05/24/18 for AMS, hypothermia.  Pt dx with sepsis of unclear etiology (urine cultures pending, CXR does not show PNA), diarrhea (c diff negative), stage 2 sacral decubitus, hypokalemia, rash on upper chest wall suspicious for shingles.  Pt with significant PMH of L eye blindness, prior DM, PVD, L great toe ulceration s/p baloon angioplasty.      PT Comments    Pt lethargic with only minimal interaction. Per OT's conversation with daughter the pt was able to transfer to transport chair with walker and min assist. Will continue to work on mobility.    Follow Up Recommendations  SNF     Equipment Recommendations  Hospital bed;Other (comment)(air mattress overlay, ambulance transport if home, hoyer )    Recommendations for Other Services       Precautions / Restrictions Precautions Precautions: Fall Restrictions Weight Bearing Restrictions: No    Mobility  Bed Mobility Overal bed mobility: Needs Assistance Bed Mobility: Rolling;Sidelying to Sit;Sit to Sidelying Rolling: Total assist Sidelying to sit: Total assist     Sit to sidelying: Total assist General bed mobility comments: Assist for all aspects  Transfers                 General transfer comment: not attempted.  If he did transfer OOB to a WC he was likely total lift with a machine or a person picking him up.    Ambulation/Gait                 Stairs             Wheelchair Mobility    Modified Rankin (Stroke Patients Only)       Balance Overall balance assessment: Needs assistance Sitting-balance support: Feet supported;No upper extremity supported;Bilateral upper extremity supported Sitting balance-Leahy Scale: Poor Sitting balance - Comments: sat EOB x 7-8 minutes with min to mod assist. No righting reactions.                                      Cognition Arousal/Alertness: Lethargic Behavior During Therapy: Flat affect Overall Cognitive Status: No family/caregiver present to determine baseline cognitive functioning                                 General Comments: Pt responding to some questions with answers like "yea" or "okay". Not following commands. Eyes closed throughout      Exercises      General Comments        Pertinent Vitals/Pain Pain Assessment: Faces Faces Pain Scale: No hurt    Home Living                      Prior Function            PT Goals (current goals can now be found in the care plan section) Progress towards PT goals: Not progressing toward goals - comment    Frequency    Min 2X/week      PT Plan Current plan remains appropriate    Co-evaluation              AM-PAC PT "6 Clicks" Mobility   Outcome Measure  Help needed  turning from your back to your side while in a flat bed without using bedrails?: Total Help needed moving from lying on your back to sitting on the side of a flat bed without using bedrails?: Total Help needed moving to and from a bed to a chair (including a wheelchair)?: Total Help needed standing up from a chair using your arms (e.g., wheelchair or bedside chair)?: Total Help needed to walk in hospital room?: Total Help needed climbing 3-5 steps with a railing? : Total 6 Click Score: 6    End of Session   Activity Tolerance: Patient limited by lethargy Patient left: in bed;with call bell/phone within reach;with bed alarm set;with nursing/sitter in room Nurse Communication: Other (comment)(nurse tech present at end of treatment) PT Visit Diagnosis: Muscle weakness (generalized) (M62.81);Difficulty in walking, not elsewhere classified (R26.2)     Time: 6659-9357 PT Time Calculation (min) (ACUTE ONLY): 13 min  Charges:  $Therapeutic Activity: 8-22 mins                      [place] Pager [phone] Office [place] 05/28/2018, 3:58 PM

## 2018-05-28 NOTE — Progress Notes (Signed)
PROGRESS NOTE    Curtis Brock.  XHB:[id number] DOB: [birth year] DOA: 05/24/2018 PCP: [facility]   Brief Narrative:   85 year old with prior h/o of DM, left eye blindness, PVD, hypertension, presents with AMS, he was found to be in AKI, with SIRS, and with right upper chest shingles.   Assessment & Plan:   Principal Problem:   Sepsis ([place]) Active Problems:   Essential hypertension, benign   Altered mental status   Diabetes type 2, controlled ([place])   Sacral decubitus ulcer   Acute renal failure syndrome (H[facility])   Shingles   1. Sepsis      - Unclear source. Lactate normalized.      - CXR does not show pneumonia.      - UCx negative     - Blood cultures have been negative so far.      - Pt had diarrhea but GI pathogen PCR and c diff PCR is negative.      - Currently empirically on IV vancomycin, cefepime and flagyl.      - Procal 0.36      - with no clear source and negative procal, no clear indication of what we are treating     - stop abx, continue acyclovir  2. Essential Hypertension:      - stable off meds, monitor   3. AKI:     - resolved  4. Stage 2 sacral decubitus ulcer:      - Wound care consulted and recommendations given.   5. Shingles:      - Vesicular lesions on the right upper chest on admission, which had decreased in size, slightly crusted. They need to be covered.       - Currently on IV acyclovir.     - rash now dried and crusting over     - as PO intake improves, transition to PO valtrex  6. Hypokalemia      - continue repletion; check labs  7. Mild anemia of chronic disease:      - Monitor hemoglobin.  8. Type 2 DM:      - Diet controlled.      - Resume SSI while inpatient.      - Dietary consulted for recommendations.      - Dysphagia 1 diet   DVT prophylaxis: lovenox Code Status: FULL Family Communication: None   Disposition Plan: TBD  Antimicrobials:  . acyclovir    Subjective: A little more  alert this AM. No acute events ON.   Objective: Vitals:   05/28/18 0343 05/28/18 0746 05/28/18 0800 05/28/18 0900  BP: 104/67 137/75    Pulse: 85 94 85 94  Resp: 20 18 12 20   Temp: 97.8 F (36.6 C) (!) 97.5 F (36.4 C)    TempSrc: Axillary Oral    SpO2: 98% 100% 99% 99%  Weight:      Height:        Intake/Output Summary (Last 24 hours) at 05/28/2018 1439 Last data filed at 05/28/2018 0800 Gross per 24 hour  Intake 3323.69 ml  Output 325 ml  Net 2998.69 ml   Filed Weights   05/26/18 0350 05/26/18 1300 05/26/18 2354  Weight: 65.7 kg 65.7 kg 68.6 kg    Examination:  General exam: 85 y.o. male Appears calm and comfortable  Respiratory system: Clear to auscultation. Respiratory effort normal. Cardiovascular system: S1 & S2 heard, RRR. No JVD, murmurs, rubs, gallops or clicks. No pedal edema. Gastrointestinal system:  Abdomen is nondistended, soft and nontender. No organomegaly or masses felt. Normal bowel sounds heard.. Skin: right shoulder/upper chest rash c/w shingles dry at this time    Data Reviewed: I have personally reviewed following labs and imaging studies.  CBC: Recent Labs  Lab 05/24/18 1310 05/25/18 0653 05/26/18 0551 05/27/18 0435 05/28/18 0423  WBC 5.0 7.2 8.1 7.6 6.2  NEUTROABS 3.7  --   --   --  4.3  HGB 13.7 10.7* 11.8* 11.1* 12.5*  HCT 42.7 32.5* 35.1* 33.1* 36.3*  MCV 84.6 82.3 80.9 80.5 79.3*  PLT 170 145* 136* 141* 761*   Basic Metabolic Panel: Recent Labs  Lab 05/25/18 0653 05/26/18 0551 05/27/18 0435 05/27/18 1806 05/28/18 0423  NA 135 138 139 137 139  K 3.1* 3.0* 2.7* 3.6 2.8*  CL 101 110 108 112* 111  CO2 23 19* 23 16* 19*  GLUCOSE 265* 167* 135* 210* 114*  BUN 21 15 12 12 10   CREATININE 1.02 0.88 0.75 0.71 0.71  CALCIUM 8.4* 8.6* 8.4* 8.1* 8.5*  MG  --  1.7 2.0  --  1.8  PHOS  --   --   --   --  1.1*   GFR: Estimated Creatinine Clearance: 55.4 mL/min (by C-G formula based on SCr of 0.71 mg/dL). Liver Function Tests:  Recent Labs  Lab 05/24/18 1310 05/28/18 0423  AST 23  --   ALT 14  --   ALKPHOS 54  --   BILITOT 0.7  --   PROT 5.8*  --   ALBUMIN 2.6* 2.1*   No results for input(s): LIPASE, AMYLASE in the last 168 hours. No results for input(s): AMMONIA in the last 168 hours. Coagulation Profile: Recent Labs  Lab 05/24/18 1310  INR 1.3*   Cardiac Enzymes: No results for input(s): CKTOTAL, CKMB, CKMBINDEX, TROPONINI in the last 168 hours. BNP (last 3 results) No results for input(s): PROBNP in the last 8760 hours. HbA1C: No results for input(s): HGBA1C in the last 72 hours. CBG: Recent Labs  Lab 05/27/18 2000 05/28/18 0020 05/28/18 0340 05/28/18 0737 05/28/18 1133  GLUCAP 132* 153* 112* 90 164*   Lipid Profile: No results for input(s): CHOL, HDL, LDLCALC, TRIG, CHOLHDL, LDLDIRECT in the last 72 hours. Thyroid Function Tests: No results for input(s): TSH, T4TOTAL, FREET4, T3FREE, THYROIDAB in the last 72 hours. Anemia Panel: No results for input(s): VITAMINB12, FOLATE, FERRITIN, TIBC, IRON, RETICCTPCT in the last 72 hours. Sepsis Labs: Recent Labs  Lab 05/24/18 1310 05/24/18 1516 05/24/18 2124 05/26/18 0551 05/27/18 0435 05/28/18 0423  PROCALCITON  --   --  1.69 0.60 0.36 0.31  LATICACIDVEN 3.9* 1.7  --   --   --   --     Recent Results (from the past 240 hour(s))  Culture, Urine     Status: None   Collection Time: 05/24/18  2:39 AM  Result Value Ref Range Status   Specimen Description URINE, RANDOM  Final   Special Requests NONE  Final   Culture   Final    NO GROWTH Performed at [facility], [place] [address], [place], [place] [address]    Report Status 05/25/2018 FINAL  Final  SARS Coronavirus 2 (CEPHEID - Performed in [place] hospital lab), Hosp Order     Status: None   Collection Time: 05/24/18 12:31 PM  Result Value Ref Range Status   SARS Coronavirus 2 NEGATIVE NEGATIVE Final    Comment: (NOTE) If result is NEGATIVE SARS-CoV-2 target nucleic acids  are NOT DETECTED. The SARS-CoV-2 RNA is generally detectable in upper and lower  respiratory specimens during the acute phase of infection. The lowest  concentration of SARS-CoV-2 viral copies this assay can detect is 250  copies / mL. A negative result does not preclude SARS-CoV-2 infection  and should not be used as the sole basis for treatment or other  patient management decisions.  A negative result may occur with  improper specimen collection / handling, submission of specimen other  than nasopharyngeal swab, presence of viral mutation(s) within the  areas targeted by this assay, and inadequate number of viral copies  (<250 copies / mL). A negative result must be combined with clinical  observations, patient history, and epidemiological information. If result is POSITIVE SARS-CoV-2 target nucleic acids are DETECTED. The SARS-CoV-2 RNA is generally detectable in upper and lower  respiratory specimens dur ing the acute phase of infection.  Positive  results are indicative of active infection with SARS-CoV-2.  Clinical  correlation with patient history and other diagnostic information is  necessary to determine patient infection status.  Positive results do  not rule out bacterial infection or co-infection with other viruses. If result is PRESUMPTIVE POSTIVE SARS-CoV-2 nucleic acids MAY BE PRESENT.   A presumptive positive result was obtained on the submitted specimen  and confirmed on repeat testing.  While 2019 novel coronavirus  (SARS-CoV-2) nucleic acids may be present in the submitted sample  additional confirmatory testing may be necessary for epidemiological  and / or clinical management purposes  to differentiate between  SARS-CoV-2 and other Sarbecovirus currently known to infect humans.  If clinically indicated additional testing with an alternate test  methodology [phone]) is advised. The SARS-CoV-2 RNA is generally  detectable in upper and lower respiratory sp ecimens  during the acute  phase of infection. The expected result is Negative. Fact Sheet for Patients:  StrictlyIdeas.no Fact Sheet for Healthcare Providers: BankingDealers.co.za This test is not yet approved or cleared by the [place] FDA and has been authorized for detection and/or diagnosis of SARS-CoV-2 by FDA under an Emergency Use Authorization (EUA).  This EUA will remain in effect (meaning this test can be used) for the duration of the COVID-19 declaration under Section 564(b)(1) of the Act, 21 U.S.C. section 360bbb-3(b)(1), unless the authorization is terminated or revoked sooner. Performed at [facility], [place] [address], [place], [place] [address]   Blood Culture (routine x 2)     Status: None (Preliminary result)   Collection Time: 05/24/18  1:10 PM  Result Value Ref Range Status   Specimen Description BLOOD LEFT FOREARM  Final   Special Requests AEROBIC BOTTLE ONLY Blood Culture adequate volume  Final   Culture   Final    NO GROWTH 4 DAYS Performed at [facility], [place] [address] [place], [place] [address]    Report Status PENDING  Incomplete  Blood Culture (routine x 2)     Status: None (Preliminary result)   Collection Time: 05/24/18  1:20 PM  Result Value Ref Range Status   Specimen Description BLOOD RIGHT WRIST  Final   Special Requests   Final    AEROBIC BOTTLE ONLY Blood Culture results may not be optimal due to an inadequate volume of blood received in culture bottles   Culture   Final    NO GROWTH 4 DAYS Performed at [facility], [place] [address], [place], [place] [address]    Report Status PENDING  Incomplete  Gastrointestinal  Panel by PCR , Stool     Status: None   Collection Time: 05/25/18  2:54 AM  Result Value Ref Range Status   Campylobacter species NOT DETECTED NOT DETECTED Final   Plesimonas shigelloides NOT DETECTED NOT DETECTED Final   Salmonella species NOT DETECTED NOT DETECTED  Final   Yersinia enterocolitica NOT DETECTED NOT DETECTED Final   Vibrio species NOT DETECTED NOT DETECTED Final   Vibrio cholerae NOT DETECTED NOT DETECTED Final   Enteroaggregative E coli (EAEC) NOT DETECTED NOT DETECTED Final   Enteropathogenic E coli (EPEC) NOT DETECTED NOT DETECTED Final   Enterotoxigenic E coli (ETEC) NOT DETECTED NOT DETECTED Final   Shiga like toxin producing E coli (STEC) NOT DETECTED NOT DETECTED Final   Shigella/Enteroinvasive E coli (EIEC) NOT DETECTED NOT DETECTED Final   Cryptosporidium NOT DETECTED NOT DETECTED Final   Cyclospora cayetanensis NOT DETECTED NOT DETECTED Final   Entamoeba histolytica NOT DETECTED NOT DETECTED Final   Giardia lamblia NOT DETECTED NOT DETECTED Final   Adenovirus F40/41 NOT DETECTED NOT DETECTED Final   Astrovirus NOT DETECTED NOT DETECTED Final   Norovirus GI/GII NOT DETECTED NOT DETECTED Final   Rotavirus A NOT DETECTED NOT DETECTED Final   Sapovirus (I, II, IV, and V) NOT DETECTED NOT DETECTED Final    Comment: Performed at [facility], [place] [address] [place], [place] [address]  C difficile quick scan w PCR reflex     Status: None   Collection Time: 05/25/18  2:54 AM  Result Value Ref Range Status   C Diff antigen NEGATIVE NEGATIVE Final   C Diff toxin NEGATIVE NEGATIVE Final   C Diff interpretation No C. difficile detected.  Final    Comment: Performed at [facility], [place] [address], [place], [place] [address]         Radiology Studies: No results found.      Scheduled Meds: . aspirin  81 mg Oral Daily  . brimonidine  1 drop Right Eye BID  . chlorhexidine  15 mL Mouth Rinse BID  . enoxaparin (LOVENOX) injection  40 mg Subcutaneous Q24H  . insulin aspart  0-9 Units Subcutaneous Q4H  . mouth rinse  15 mL Mouth Rinse q12n4p  . sodium chloride flush  10-40 mL Intracatheter Q12H  . sodium chloride flush  3 mL Intravenous Q12H  . timolol  1 drop Right Eye Daily   Continuous Infusions: .  acyclovir 700 mg (05/28/18 1310)  . dextrose 5 % and 0.9% NaCl 75 mL/hr at 05/28/18 0500  . potassium PHOSPHATE IVPB (mEq) 40 mEq (05/28/18 1105)     LOS: 4 days    Time spent: 30 minutes spent in the coordination of care today.     Curtis Finner, DO [facility] Pager [phone]  If 7PM-7AM, please contact night-coverage [email or web address] Password [facility] 05/28/2018, 2:39 PM

## 2018-05-29 LAB — BASIC METABOLIC PANEL
Anion gap: 7 (ref 5–15)
Anion gap: 8 (ref 5–15)
BUN: 11 mg/dL (ref 8–23)
BUN: 8 mg/dL (ref 8–23)
CO2: 21 mmol/L — ABNORMAL LOW (ref 22–32)
CO2: 20 mmol/L — ABNORMAL LOW (ref 22–32)
Calcium: 8.1 mg/dL — ABNORMAL LOW (ref 8.9–10.3)
Calcium: 8.3 mg/dL — ABNORMAL LOW (ref 8.9–10.3)
Chloride: 109 mmol/L (ref 98–111)
Chloride: 110 mmol/L (ref 98–111)
Creatinine, Ser: 0.8 mg/dL (ref 0.61–1.24)
Creatinine, Ser: 0.73 mg/dL (ref 0.61–1.24)
GFR calc Af Amer: >60 mL/min (ref >60)
GFR calc Af Amer: >60 mL/min (ref >60)
GFR calc non Af Amer: >60 mL/min (ref >60)
GFR calc non Af Amer: >60 mL/min (ref >60)
Glucose, Bld: 94 mg/dL (ref 70–99)
Glucose, Bld: 90 mg/dL (ref 70–99)
Potassium: 5.6 mmol/L — ABNORMAL HIGH (ref 3.5–5.1)
Potassium: 3.8 mmol/L (ref 3.5–5.1)
Sodium: 137 mmol/L (ref 135–145)
Sodium: 138 mmol/L (ref 135–145)

## 2018-05-29 LAB — CULTURE, BLOOD (ROUTINE X 2)
Culture: NO GROWTH
Culture: NO GROWTH
Special Requests: ADEQUATE

## 2018-05-29 LAB — CBC
HCT: 35 % — ABNORMAL LOW (ref 39.0–52.0)
Hemoglobin: 11.9 g/dL — ABNORMAL LOW (ref 13.0–17.0)
MCH: 26.9 pg (ref 26.0–34.0)
MCHC: 34 g/dL (ref 30.0–36.0)
MCV: 79.2 fL — ABNORMAL LOW (ref 80.0–100.0)
Platelets: 161 10*3/uL (ref 150–400)
RBC: 4.42 MIL/uL (ref 4.22–5.81)
RDW: 13.8 % (ref 11.5–15.5)
WBC: 4.2 10*3/uL (ref 4.0–10.5)
nRBC: 0 % (ref 0.0–0.2)

## 2018-05-29 LAB — MAGNESIUM: Magnesium: 2 mg/dL (ref 1.7–2.4)

## 2018-05-29 LAB — GLUCOSE, CAPILLARY
Glucose-Capillary: 76 mg/dL (ref 70–99)
Glucose-Capillary: 87 mg/dL (ref 70–99)
Glucose-Capillary: 146 mg/dL — ABNORMAL HIGH (ref 70–99)
Glucose-Capillary: 152 mg/dL — ABNORMAL HIGH (ref 70–99)
Glucose-Capillary: 141 mg/dL — ABNORMAL HIGH (ref 70–99)

## 2018-05-29 MED ORDER — VALACYCLOVIR HCL 500 MG PO TABS
1000.0000 mg | ORAL_TABLET | Freq: Three times a day (TID) | ORAL | Status: AC
  Administered 2018-05-29 – 2018-05-31 (×6): 1000 mg via ORAL
  Filled 2018-05-29 (×6): qty 2

## 2018-05-29 NOTE — Care Management Important Message (Signed)
Important Message  Patient Details  Name: Curtis Brock. MRN: [id number] Date of Birth: 07-27-1925   Medicare Important Message Given:  Yes    Nikola Blackston 05/29/2018, 1:57 PM

## 2018-05-29 NOTE — Progress Notes (Signed)
Occupational Therapy Treatment Patient Details Name: Curtis Brock. MRN: [id number] DOB: [birth year] Today's Date: 05/29/2018    History of present illness 85 y.o. male admitted on 05/24/18 for AMS, hypothermia.  Pt dx with sepsis of unclear etiology (urine cultures pending, CXR does not show PNA), diarrhea (c diff negative), stage 2 sacral decubitus, hypokalemia, rash on upper chest wall suspicious for shingles.  Pt with significant PMH of L eye blindness, prior DM, PVD, L great toe ulceration s/p baloon angioplasty.     OT comments  Pt tolerating PROM to BUEs and pt resisting further movement to EOB. Pt rolling with totalA and performing minimal initiation of movement. Pt continues to keep eyes closed for session. Pt would benefit from continued OT for ADL, mobility and safety as per pt's family, this is not his baseline functioning. OT to follow acutely.     Follow Up Recommendations  SNF;Supervision/Assistance - 24 hour(per family, he was transfering with assist. )    Equipment Recommendations  Other (comment)(to be determined)    Recommendations for Other Services      Precautions / Restrictions Precautions Precautions: Fall Restrictions Weight Bearing Restrictions: No       Mobility Bed Mobility Overal bed mobility: Needs Assistance Bed Mobility: Rolling Rolling: Total assist         General bed mobility comments: Assist for all aspects  Transfers                      Balance Overall balance assessment: Needs assistance                                         ADL either performed or assessed with clinical judgement   ADL Overall ADL's : Needs assistance/impaired                                     Functional mobility during ADLs: Total assistance General ADL Comments: Pt attempting to wash face with RUE even with gravity assisted assist; pt performing minimal movements today,but able to state daughter's name      Vision   Vision Assessment?: Vision impaired- to be further tested in functional context Additional Comments: (pt does not open eyes)   Perception     Praxis      Cognition Arousal/Alertness: Lethargic Behavior During Therapy: Flat affect Overall Cognitive Status: No family/caregiver present to determine baseline cognitive functioning                                 General Comments: Pt responding to some questions with answers like "yea" or "okay". Not following commands. Eyes closed throughout        Exercises Exercises: General Upper Extremity General Exercises - Upper Extremity Shoulder Flexion: PROM;10 reps;Supine Shoulder ABduction: PROM;10 reps;Supine Shoulder Horizontal ABduction: PROM;10 reps;Supine Elbow Flexion: AAROM;10 reps;Supine Wrist Flexion: PROM;10 reps;Supine   Shoulder Instructions       General Comments      Pertinent Vitals/ Pain       Pain Assessment: Faces Faces Pain Scale: No hurt  Home Living  Prior Functioning/Environment              Frequency  Min 2X/week        Progress Toward Goals  OT Goals(current goals can now be found in the care plan section)  Progress towards OT goals: Progressing toward goals  Acute Rehab OT Goals Patient Stated Goal: unable to state OT Goal Formulation: With patient Time For Goal Achievement: 06/10/18 Potential to Achieve Goals: Fair ADL Goals Pt Will Perform Grooming: with max assist;sitting Pt Will Perform Upper Body Dressing: with max assist;sitting Pt/caregiver will Perform Home Exercise Program: Increased ROM;Increased strength;Both right and left upper extremity Additional ADL Goal #1: Pt will tolerate EOB x10 mins for ADL task wth fair balance Additional ADL Goal #2: Pt will perform ADL functional transfer with [place] Discharge plan remains appropriate    Co-evaluation                 AM-PAC OT  "6 Clicks" Daily Activity     Outcome Measure   Help from another person eating meals?: Total Help from another person taking care of personal grooming?: Total Help from another person toileting, which includes using toliet, bedpan, or urinal?: Total Help from another person bathing (including washing, rinsing, drying)?: Total Help from another person to put on and taking off regular upper body clothing?: Total Help from another person to put on and taking off regular lower body clothing?: Total 6 Click Score: 6    End of Session    OT Visit Diagnosis: Unsteadiness on feet (R26.81);Muscle weakness (generalized) (M62.81);Other symptoms and signs involving cognitive function   Activity Tolerance Patient limited by lethargy;Patient limited by fatigue;Treatment limited secondary to medical complications (Comment)   Patient Left in bed;with call bell/phone within reach;with bed alarm set   Nurse Communication Mobility status        Time: 1243-1300 OT Time Calculation (min): 17 min  Charges: OT General Charges $OT Visit: 1 Visit OT Treatments $Therapeutic Activity: 8-22 mins  Darryl Nestle) Marsa Aris OTR/L Acute Rehabilitation Services Pager: [phone] Office: [place] 05/29/2018, 3:19 PM

## 2018-05-29 NOTE — Progress Notes (Signed)
[name]  Curtis Brock:[id number] DOB: [birth year] DOA: 05/24/2018 PCP: [facility]   Brief Narrative:   85 year old with prior h/o of DM, left eye blindness, PVD, hypertension, presents with AMS, he was found to be in AKI, with SIRS, and with right upper chest shingles.   Assessment & Plan:   Principal Problem:   Sepsis ([place]) Active Problems:   Essential hypertension, benign   Altered mental status   Diabetes type 2, controlled ([place])   Sacral decubitus ulcer   Acute renal failure syndrome (HCC)   Shingles   1. Sepsis  - Unclear source. Lactate normalized.  - CXR does not show pneumonia.  - UCx negative - Blood cultures have been negative so far.  - Pt had diarrhea but GI pathogen PCR and c diff PCR is negative.  - Currently empirically on IV vancomycin, cefepime and flagyl.  - Procal 0.36  - with no clear source and negative procal, no clear indication of what we are treating     - change to valtrex  2. Essential Hypertension:  - stable off meds, monitor   3. AKI: - resolved  4. Stage 2 sacral decubitus ulcer:  - Wound care consulted and recommendations given.   5. Shingles:  - Vesicular lesions on the right upper chest on admission, which had decreased in size, slightly crusted. They need to be covered.  - Currently on IV acyclovir. - rash now dried and crusting over     - as PO intake improves, transition to PO valtrex  6. Hypokalemia  - resolved  7. Mild anemia of chronic disease:  - Monitor hemoglobin.  8. Type 2 DM:  - Diet controlled.  - Resume SSI while inpatient.  - Dietary consulted for recommendations.      - Dysphagia 1 diet   DVT prophylaxis: lovenox Code Status: FULL Family Communication: None   Disposition Plan: TBD   Antimicrobials:  . valtrex    Subjective: More alert this morning. Denies complaints. PO  intake slowly increasing. Hopefully home soon.   Objective: Vitals:   05/29/18 0319 05/29/18 0531 05/29/18 0759 05/29/18 1159  BP: 112/74  119/89 102/68  Pulse: 81  (!) 110 (!) 124  Resp: 16  12 20   Temp: 97.7 F (36.5 C)  97.8 F (36.6 C) 97.8 F (36.6 C)  TempSrc: Axillary  Oral   SpO2: 100%  90% (!) 89%  Weight:  70.9 kg    Height:        Intake/Output Summary (Last 24 hours) at 05/29/2018 1604 Last data filed at 05/29/2018 1500 Gross per 24 hour  Intake 1084.22 ml  Output 1050 ml  Net 34.22 ml   Filed Weights   05/26/18 1300 05/26/18 2354 05/29/18 0531  Weight: 65.7 kg 68.6 kg 70.9 kg    Examination:  General exam:85 y.o.maleAppears calm and comfortable  Respiratory system: Clear to auscultation. Respiratory effort normal. Cardiovascular system:S1 &S2 heard, RRR. No JVD, murmurs, rubs, gallops or clicks. No pedal edema. Gastrointestinal system:Abdomen is nondistended, soft and nontender. No organomegaly or masses felt. Normal bowel sounds heard.. Skin:right shoulder/upper chest rash c/w shingles dry at this time Psychiatry: alert to name, calm, cooperative    Data Reviewed: I have personally reviewed following labs and imaging studies.  CBC: Recent Labs  Lab 05/24/18 1310 05/25/18 0653 05/26/18 0551 05/27/18 0435 05/28/18 0423 05/29/18 0500  WBC 5.0 7.2 8.1 7.6 6.2 4.2  NEUTROABS 3.7  --   --   --  4.3  --   HGB 13.7 10.7* 11.8* 11.1* 12.5* 11.9*  HCT 42.7 32.5* 35.1* 33.1* 36.3* 35.0*  MCV 84.6 82.3 80.9 80.5 79.3* 79.2*  PLT 170 145* 136* 141* 142* 211   Basic Metabolic Panel: Recent Labs  Lab 05/26/18 0551 05/27/18 0435 05/27/18 1806 05/28/18 0423 05/28/18 1500 05/29/18 0500 05/29/18 0811  NA 138 139 137 139 138 137 138  K 3.0* 2.7* 3.6 2.8* 3.1* 5.6* 3.8  CL 110 108 112* 111 110 109 110  CO2 19* 23 16* 19* 20* 21* 20*  GLUCOSE 167* 135* 210* 114* 194* 94 90  BUN 15 12 12 10 10 11 8   CREATININE 0.88 0.75 0.71 0.71 0.85 0.80 0.73   CALCIUM 8.6* 8.4* 8.1* 8.5* 8.0* 8.1* 8.3*  MG 1.7 2.0  --  1.8  --  2.0  --   PHOS  --   --   --  1.1*  --   --   --    GFR: Estimated Creatinine Clearance: 55.4 mL/min (by C-G formula based on SCr of 0.73 mg/dL). Liver Function Tests: Recent Labs  Lab 05/24/18 1310 05/28/18 0423  AST 23  --   ALT 14  --   ALKPHOS 54  --   BILITOT 0.7  --   PROT 5.8*  --   ALBUMIN 2.6* 2.1*   No results for input(s): LIPASE, AMYLASE in the last 168 hours. No results for input(s): AMMONIA in the last 168 hours. Coagulation Profile: Recent Labs  Lab 05/24/18 1310  INR 1.3*   Cardiac Enzymes: No results for input(s): CKTOTAL, CKMB, CKMBINDEX, TROPONINI in the last 168 hours. BNP (last 3 results) No results for input(s): PROBNP in the last 8760 hours. HbA1C: No results for input(s): HGBA1C in the last 72 hours. CBG: Recent Labs  Lab 05/28/18 1915 05/28/18 2317 05/29/18 0323 05/29/18 0755 05/29/18 1154  GLUCAP 120* 81 76 87 146*   Lipid Profile: No results for input(s): CHOL, HDL, LDLCALC, TRIG, CHOLHDL, LDLDIRECT in the last 72 hours. Thyroid Function Tests: No results for input(s): TSH, T4TOTAL, FREET4, T3FREE, THYROIDAB in the last 72 hours. Anemia Panel: No results for input(s): VITAMINB12, FOLATE, FERRITIN, TIBC, IRON, RETICCTPCT in the last 72 hours. Sepsis Labs: Recent Labs  Lab 05/24/18 1310 05/24/18 1516 05/24/18 2124 05/26/18 0551 05/27/18 0435 05/28/18 0423  PROCALCITON  --   --  1.69 0.60 0.36 0.31  LATICACIDVEN 3.9* 1.7  --   --   --   --     Recent Results (from the past 240 hour(s))  Culture, Urine     Status: None   Collection Time: 05/24/18  2:39 AM  Result Value Ref Range Status   Specimen Description URINE, RANDOM  Final   Special Requests NONE  Final   Culture   Final    NO GROWTH Performed at [facility], [place] [address], [place], [place] [address]    Report Status 05/25/2018 FINAL  Final  SARS Coronavirus 2 (CEPHEID - Performed in [place] hospital lab), Hosp Order     Status: None   Collection Time: 05/24/18 12:31 PM  Result Value Ref Range Status   SARS Coronavirus 2 NEGATIVE NEGATIVE Final    Comment: (NOTE) If result is NEGATIVE SARS-CoV-2 target nucleic acids are NOT DETECTED. The SARS-CoV-2 RNA is generally detectable in upper and lower  respiratory specimens during the acute phase of infection. The lowest  concentration of SARS-CoV-2 viral copies this assay can detect is 250  copies / mL. A negative result does not preclude SARS-CoV-2 infection  and should not be used as the sole basis for treatment or other  patient management decisions.  A negative result may occur with  improper specimen collection / handling, submission of specimen other  than nasopharyngeal swab, presence of viral mutation(s) within the  areas targeted by this assay, and inadequate number of viral copies  (<250 copies / mL). A negative result must be combined with clinical  observations, patient history, and epidemiological information. If result is POSITIVE SARS-CoV-2 target nucleic acids are DETECTED. The SARS-CoV-2 RNA is generally detectable in upper and lower  respiratory specimens dur ing the acute phase of infection.  Positive  results are indicative of active infection with SARS-CoV-2.  Clinical  correlation with patient history and other diagnostic information is  necessary to determine patient infection status.  Positive results do  not rule out bacterial infection or co-infection with other viruses. If result is PRESUMPTIVE POSTIVE SARS-CoV-2 nucleic acids MAY BE PRESENT.   A presumptive positive result was obtained on the submitted specimen  and confirmed on repeat testing.  While 2019 novel coronavirus  (SARS-CoV-2) nucleic acids may be present in the submitted sample  additional confirmatory testing may be necessary for epidemiological  and / or clinical management purposes  to differentiate between  SARS-CoV-2 and  other Sarbecovirus currently known to infect humans.  If clinically indicated additional testing with an alternate test  methodology [phone]) is advised. The SARS-CoV-2 RNA is generally  detectable in upper and lower respiratory sp ecimens during the acute  phase of infection. The expected result is Negative. Fact Sheet for Patients:  StrictlyIdeas.no Fact Sheet for Healthcare Providers: BankingDealers.co.za This test is not yet approved or cleared by the [place] FDA and has been authorized for detection and/or diagnosis of SARS-CoV-2 by FDA under an Emergency Use Authorization (EUA).  This EUA will remain in effect (meaning this test can be used) for the duration of the COVID-19 declaration under Section 564(b)(1) of the Act, 21 U.S.C. section 360bbb-3(b)(1), unless the authorization is terminated or revoked sooner. Performed at [facility], [place] [address] [place], [place] [address]   Blood Culture (routine x 2)     Status: None   Collection Time: 05/24/18  1:10 PM  Result Value Ref Range Status   Specimen Description BLOOD LEFT FOREARM  Final   Special Requests AEROBIC BOTTLE ONLY Blood Culture adequate volume  Final   Culture   Final    NO GROWTH 5 DAYS Performed at [facility], [place] [address], [place], [place] [address]    Report Status 05/29/2018 FINAL  Final  Blood Culture (routine x 2)     Status: None   Collection Time: 05/24/18  1:20 PM  Result Value Ref Range Status   Specimen Description BLOOD RIGHT WRIST  Final   Special Requests   Final    AEROBIC BOTTLE ONLY Blood Culture results may not be optimal due to an inadequate volume of blood received in culture bottles   Culture   Final    NO GROWTH 5 DAYS Performed at [facility], [place] [address] [place], [place] [address]    Report Status 05/29/2018 FINAL  Final  Gastrointestinal Panel by PCR , Stool     Status: None   Collection Time:  05/25/18  2:54 AM  Result Value Ref Range Status   Campylobacter species NOT DETECTED NOT DETECTED Final   Plesimonas shigelloides  NOT DETECTED NOT DETECTED Final   Salmonella species NOT DETECTED NOT DETECTED Final   Yersinia enterocolitica NOT DETECTED NOT DETECTED Final   Vibrio species NOT DETECTED NOT DETECTED Final   Vibrio cholerae NOT DETECTED NOT DETECTED Final   Enteroaggregative E coli (EAEC) NOT DETECTED NOT DETECTED Final   Enteropathogenic E coli (EPEC) NOT DETECTED NOT DETECTED Final   Enterotoxigenic E coli (ETEC) NOT DETECTED NOT DETECTED Final   Shiga like toxin producing E coli (STEC) NOT DETECTED NOT DETECTED Final   Shigella/Enteroinvasive E coli (EIEC) NOT DETECTED NOT DETECTED Final   Cryptosporidium NOT DETECTED NOT DETECTED Final   Cyclospora cayetanensis NOT DETECTED NOT DETECTED Final   Entamoeba histolytica NOT DETECTED NOT DETECTED Final   Giardia lamblia NOT DETECTED NOT DETECTED Final   Adenovirus F40/41 NOT DETECTED NOT DETECTED Final   Astrovirus NOT DETECTED NOT DETECTED Final   Norovirus GI/GII NOT DETECTED NOT DETECTED Final   Rotavirus A NOT DETECTED NOT DETECTED Final   Sapovirus (I, II, IV, and V) NOT DETECTED NOT DETECTED Final    Comment: Performed at [facility], [place] [place], [place] [address]  C difficile quick scan w PCR reflex     Status: None   Collection Time: 05/25/18  2:54 AM  Result Value Ref Range Status   C Diff antigen NEGATIVE NEGATIVE Final   C Diff toxin NEGATIVE NEGATIVE Final   C Diff interpretation No C. difficile detected.  Final    Comment: Performed at [facility], [place] [address] [place], [place] [address]     Radiology Studies: No results found.   Scheduled Meds: . aspirin  81 mg Oral Daily  . brimonidine  1 drop Right Eye BID  . chlorhexidine  15 mL Mouth Rinse BID  . enoxaparin (LOVENOX) injection  40 mg Subcutaneous Q24H  . insulin aspart  0-9 Units Subcutaneous Q4H  . mouth  rinse  15 mL Mouth Rinse q12n4p  . sodium chloride flush  10-40 mL Intracatheter Q12H  . sodium chloride flush  3 mL Intravenous Q12H  . timolol  1 drop Right Eye Daily  . valACYclovir  1,000 mg Oral TID   Continuous Infusions: . dextrose 5 % and 0.9% NaCl Stopped (05/28/18 1816)     LOS: 5 days    Time spent: 25 minutes spent in the coordination of care today.    Jonnie Finner, DO [facility] Pager [phone]  If 7PM-7AM, please contact night-coverage [email or web address] Password [facility] 05/29/2018, 4:04 PM

## 2018-05-30 LAB — RENAL FUNCTION PANEL
Albumin: 1.9 g/dL — ABNORMAL LOW (ref 3.5–5.0)
Anion gap: 9 (ref 5–15)
BUN: 13 mg/dL (ref 8–23)
CO2: 22 mmol/L (ref 22–32)
Calcium: 8.7 mg/dL — ABNORMAL LOW (ref 8.9–10.3)
Chloride: 108 mmol/L (ref 98–111)
Creatinine, Ser: 0.86 mg/dL (ref 0.61–1.24)
GFR calc Af Amer: >60 mL/min (ref >60)
GFR calc non Af Amer: >60 mL/min (ref >60)
Glucose, Bld: 121 mg/dL — ABNORMAL HIGH (ref 70–99)
Phosphorus: 1.8 mg/dL — ABNORMAL LOW (ref 2.5–4.6)
Potassium: 2.9 mmol/L — ABNORMAL LOW (ref 3.5–5.1)
Sodium: 139 mmol/L (ref 135–145)

## 2018-05-30 LAB — GLUCOSE, CAPILLARY
Glucose-Capillary: 123 mg/dL — ABNORMAL HIGH (ref 70–99)
Glucose-Capillary: 142 mg/dL — ABNORMAL HIGH (ref 70–99)
Glucose-Capillary: 147 mg/dL — ABNORMAL HIGH (ref 70–99)
Glucose-Capillary: 162 mg/dL — ABNORMAL HIGH (ref 70–99)
Glucose-Capillary: 178 mg/dL — ABNORMAL HIGH (ref 70–99)
Glucose-Capillary: 75 mg/dL (ref 70–99)

## 2018-05-30 LAB — MAGNESIUM: Magnesium: 1.9 mg/dL (ref 1.7–2.4)

## 2018-05-30 MED ORDER — POTASSIUM & SODIUM PHOSPHATES 280-160-250 MG PO PACK
1.0000 | PACK | Freq: Three times a day (TID) | ORAL | Status: DC
  Administered 2018-05-30 – 2018-06-05 (×24): 1 via ORAL
  Filled 2018-05-30 (×26): qty 1

## 2018-05-30 NOTE — TOC Initial Note (Signed)
Transition of Care (TOC) - Initial/Assessment Note    Patient Details  Name: Curtis Brock. MRN: [id number] Date of Birth: Feb 13, 1925  Transition of Care [facility]) CM/SW Contact:    Gelene Mink, [place] Phone Number: 05/30/2018, 4:18 PM  Clinical Narrative:                  CSW called and spoke with the patient's daughter, Curtis Brock. CSW called her at the home number of [phone]. CSW introduced herself and explained her role. Patient's daughter Curtis Brock stated that she is agreeable to her father going to skilled nursing rehab. She stated that he has been to rehab in the past at [facility] and did not like it. She shared that she does not want her father to go back. CSW asked if she had any other preferences, she stated that her first choice would be [facility] and her second choice would be [facility].   CSW went over the SNF process and explained that if her father had to stay 20 or more days that he may have a copay. She understood and is agreeable to that. She requested that once her father has bed offers to call her. She stated that she will need her dad to go by PTAR once he is able to discharge.   CSW emailed the CMS list to [place] at [email or web address] and placed a copy on the patient's chart.   CSW will continue to assist with disposition.    Expected Discharge Plan: Skilled Nursing Facility Barriers to Discharge: Continued Medical Work up, Ship broker   Patient Goals and CMS Choice Patient states their goals for this hospitalization and ongoing recovery are:: Pt daughter would like her father to get some rehab before returning home CMS Medicare.gov Compare Post Acute Care list provided to:: Patient Represenative (must comment)(Pt daughter, Curtis Brock) Choice offered to / list presented to : Adult Children  Expected Discharge Plan and Services Expected Discharge Plan: [place] In-house Referral: Clinical Social Work Discharge Planning  Services: NA Post Acute Care Choice: [place] Living arrangements for the past 2 months: Single Family Home                 DME Arranged: N/A DME Agency: NA       HH Arranged: NA [place] Agency: NA        Prior Living Arrangements/Services Living arrangements for the past 2 months: [place] Lives with:: Adult Children Patient language and need for interpreter reviewed:: No Do you feel safe going back to the place where you live?: Yes      Need for Family Participation in Patient Care: Yes (Comment) Care giver support system in place?: Yes (comment)   Criminal Activity/Legal Involvement Pertinent to Current Situation/Hospitalization: No - Comment as needed  Activities of Daily Living      Permission Sought/Granted Permission sought to share information with : Case Manager Permission granted to share information with : Yes, Verbal Permission Granted  Share Information with NAME: Curtis Brock  Permission granted to share info w AGENCY: All SNF's  Permission granted to share info w Relationship: Daughter      Emotional Assessment Appearance:: Appears stated age Attitude/Demeanor/Rapport: Unable to Assess Affect (typically observed): Unable to Assess Orientation: : Oriented to Self Alcohol / Substance Use: Not Applicable Psych Involvement: No (comment)  Admission diagnosis:  Diarrhea, unspecified type [R19.7] Sepsis, due to unspecified organism, unspecified whether acute organ dysfunction present [facility]) [A41.9] Patient  Active Problem List   Diagnosis Date Noted  . Sepsis ([place]) 05/24/2018  . Shingles 05/24/2018  . Acute kidney injury ([place])   . Acute renal failure syndrome ([place])   . Sacral decubitus ulcer   . CVA (cerebral vascular accident) ([place])   . Pulmonary hypertension ([place])   . Metabolic acidosis   . Midline low back pain without sciatica   . Altered mental status   . Hypothermia   . Hyperkalemia   . Diabetes type 2, controlled ([place])   .  SIRS (systemic inflammatory response syndrome) ([place]) 05/01/2014  . Acute encephalopathy 05/01/2014  . Fungal rash of trunk 05/01/2014  . Heel spur 05/04/2013  . Weakness 04/17/2013  . Anemia of other chronic disease 06/10/2012  . Essential hypertension, benign 06/03/2012  . Type II or unspecified type diabetes mellitus with neurological manifestations, not stated as uncontrolled(250.60) 06/03/2012  . Abnormality of gait 05/28/2012  . Type I (juvenile type) diabetes mellitus with peripheral circulatory disorders, not stated as uncontrolled(250.71) 05/28/2012  . Unspecified hereditary and idiopathic peripheral neuropathy 05/28/2012  . Unspecified late effects of cerebrovascular disease 05/28/2012  . Weakness generalized 05/11/2012  . Blind left eye 12/11/2011  . Neuropathy 12/11/2011  . Elevated LFTs 11/12/2011  . Hyperlipidemia 03/26/2011  . CVA (cerebral infarction) 03/26/2011   PCP:  [place], [place]:   [id number] [place], [place] [address] Phone: [phone] Fax: [phone]     Social Determinants of Health (SDOH) Interventions    Readmission Risk Interventions No flowsheet data found.

## 2018-05-30 NOTE — Progress Notes (Signed)
CSW called and left a voicemail for patient's daughter. CSW will plan on discussing SNF placement once she calls back.   Domenic Schwab, MSW, [place]

## 2018-05-30 NOTE — NC FL2 (Signed)
[place] LEVEL OF CARE SCREENING TOOL     IDENTIFICATION  Patient Name: Curtis Brock. Birthdate: [birth year] Sex: male Admission Date (Current Location): 05/24/2018  [facility] and [place] Number:  Herbalist and Address:  [facility] [place]. [facility], [place] [address] [place], [place] [address]      Provider Number: [id number]  Attending Physician Name and Address:  Jonnie Finner, DO  Relative Name and Phone Number:  [name], Daughter, [phone]    Current Level of Care: Hospital Recommended Level of Care: [place] Prior Approval Number: [id number]  Date Approved/Denied: 05/12/12 PASRR Number:    Discharge Plan: SNF    Current Diagnoses: Patient Active Problem List   Diagnosis Date Noted  . Sepsis ([place]) 05/24/2018  . Shingles 05/24/2018  . Acute kidney injury ([place])   . Acute renal failure syndrome ([place])   . Sacral decubitus ulcer   . CVA (cerebral vascular accident) ([place])   . Pulmonary hypertension ([place])   . Metabolic acidosis   . Midline low back pain without sciatica   . Altered mental status   . Hypothermia   . Hyperkalemia   . Diabetes type 2, controlled ([place])   . SIRS (systemic inflammatory response syndrome) ([place]) 05/01/2014  . Acute encephalopathy 05/01/2014  . Fungal rash of trunk 05/01/2014  . Heel spur 05/04/2013  . Weakness 04/17/2013  . Anemia of other chronic disease 06/10/2012  . Essential hypertension, benign 06/03/2012  . Type II or unspecified type diabetes mellitus with neurological manifestations, not stated as uncontrolled(250.60) 06/03/2012  . Abnormality of gait 05/28/2012  . Type I (juvenile type) diabetes mellitus with peripheral circulatory disorders, not stated as uncontrolled(250.71) 05/28/2012  . Unspecified hereditary and idiopathic peripheral neuropathy 05/28/2012  . Unspecified late effects of cerebrovascular disease 05/28/2012  . Weakness generalized 05/11/2012  . Blind left  eye 12/11/2011  . Neuropathy 12/11/2011  . Elevated LFTs 11/12/2011  . Hyperlipidemia 03/26/2011  . CVA (cerebral infarction) 03/26/2011    Orientation RESPIRATION BLADDER Height & Weight     Self  Normal Incontinent, External catheter Weight: 157 lb 6.5 oz (71.4 kg) Height:  5\' 6"  (167.6 cm)  BEHAVIORAL SYMPTOMS/MOOD NEUROLOGICAL BOWEL NUTRITION STATUS      Incontinent Diet(Dys 1 diet, nectar thick liquids)  AMBULATORY STATUS COMMUNICATION OF NEEDS Skin   Extensive Assist Verbally Other (Comment), Skin abrasions(dry skin, blister on right arm, MASD on penis, rash on chest, skin tear on penis, pressure injury on sacrum (change dressing every 3 days), wound on anus)                       Personal Care Assistance Level of Assistance  Bathing, Feeding, Dressing, Total care Bathing Assistance: Maximum assistance Feeding assistance: Maximum assistance Dressing Assistance: Maximum assistance Total Care Assistance: Maximum assistance   Functional Limitations Info  Sight, Hearing, Speech Sight Info: Impaired Hearing Info: Impaired Speech Info: Adequate    SPECIAL CARE FACTORS FREQUENCY  PT (By licensed PT), OT (By licensed OT)     PT Frequency: 5x/wk, Hospital bed;Other (comment)(air mattress overlay, ambulance transport if home, hoyer )  OT Frequency: 5x/wk            Contractures Contractures Info: Not present    Additional Factors Info  Code Status, Allergies, Insulin Sliding Scale Code Status Info: Full Code Allergies Info: No Known Allergies   Insulin Sliding Scale Info: insulin aspart 0-9 units every 4 hours  Current Medications (05/30/2018):  This is the current hospital active medication list Current Facility-Administered Medications  Medication Dose Route Frequency Provider Last Rate Last Dose  . acetaminophen (TYLENOL) tablet 650 mg  650 mg Oral Q6H PRN Karmen Bongo, MD       Or  . acetaminophen (TYLENOL) suppository 650 mg  650 mg Rectal Q6H  PRN Karmen Bongo, MD   650 mg at 05/26/18 0000  . aspirin chewable tablet 81 mg  81 mg Oral Daily Hosie Poisson, MD   81 mg at 05/30/18 0834  . brimonidine (ALPHAGAN) 0.2 % ophthalmic solution 1 drop  1 drop Right Eye BID Karmen Bongo, MD   1 drop at 05/30/18 0835  . chlorhexidine (PERIDEX) 0.12 % solution 15 mL  15 mL Mouth Rinse BID Hosie Poisson, MD   15 mL at 05/30/18 0834  . enoxaparin (LOVENOX) injection 40 mg  40 mg Subcutaneous Q24H Karmen Bongo, MD   40 mg at 05/29/18 2203  . insulin aspart (novoLOG) injection 0-9 Units  0-9 Units Subcutaneous Q4H Hosie Poisson, MD   1 Units at 05/30/18 1236  . MEDLINE mouth rinse  15 mL Mouth Rinse q12n4p Hosie Poisson, MD   15 mL at 05/30/18 1240  . ondansetron (ZOFRAN) tablet 4 mg  4 mg Oral Q6H PRN Karmen Bongo, MD       Or  . ondansetron [facility]) injection 4 mg  4 mg Intravenous Q6H PRN Karmen Bongo, MD      . polyethylene glycol (MIRALAX / GLYCOLAX) packet 17 g  17 g Oral Daily PRN Karmen Bongo, MD      . potassium & sodium phosphates (PHOS-NAK) 280-160-250 MG packet 1 packet  1 packet Oral TID WC & HS Kyle, Tyrone A, DO   1 packet at 05/30/18 1236  . sodium chloride flush (NS) 0.9 % injection 10-40 mL  10-40 mL Intracatheter Q12H Hosie Poisson, MD   10 mL at 05/30/18 0836  . sodium chloride flush (NS) 0.9 % injection 10-40 mL  10-40 mL Intracatheter PRN Hosie Poisson, MD      . sodium chloride flush (NS) 0.9 % injection 3 mL  3 mL Intravenous Q12H Karmen Bongo, MD   3 mL at 05/30/18 0835  . timolol (TIMOPTIC) 0.5 % ophthalmic solution 1 drop  1 drop Right Eye Daily Karmen Bongo, MD   1 drop at 05/30/18 0836  . valACYclovir (VALTREX) tablet 1,000 mg  1,000 mg Oral TID Cherylann Ratel A, DO   1,000 mg at 05/30/18 3953     Discharge Medications: Please see discharge summary for a list of discharge medications.  Relevant Imaging Results:  Relevant Lab Results:   Additional Information SSN: [id number]  [place],  LCSWA

## 2018-05-30 NOTE — Progress Notes (Signed)
Curtis Brock Kitchen  PROGRESS NOTE    Curtis Ly.  KDX:[id number] DOB: January 04, 1926 DOA: 05/24/2018 PCP: [facility]   Brief Narrative:   85 year old with prior h/o of DM, left eye blindness, PVD, hypertension, presents with AMS, he was found to be in AKI, with SIRS, and with right upper chest shingles.   Assessment & Plan:   Principal Problem:   Sepsis ([place]) Active Problems:   Essential hypertension, benign   Altered mental status   Diabetes type 2, controlled ([place])   Sacral decubitus ulcer   Acute renal failure syndrome (HCC)   Shingles   1. Sepsis  - Unclear source. Lactate normalized.  - CXR does not show pneumonia.  - UCx negative - Blood cultures have been negative so far.  - Pt had diarrhea but GI pathogen PCR and c diff PCR is negative.  - was treated with IV vancomycin, cefepime and flagyl; but where stopped as there was no clear indication of what was being treated and a negative procal     - from a sepsis standpoint, that has been treated is is most likely d/t his shingles - changed to valtrex  2. Essential Hypertension:  - stable off meds, monitor   3. AKI: - resolved  4. Stage 2 sacral decubitus ulcer:  - Wound care consulted and recommendations given.   5. Shingles:  - Vesicular lesions on the right upper chest on admission, which had decreased in size, slightly crusted. They need to be covered.  - Currently on IV acyclovir. - rash now dried and crusting over - as PO intake improves, transition to PO valtrex  6. Hypokalemia/hypophosphatemia - adding K-phos; this will likely need to be a daily supplement  7. Mild anemia of chronic disease:  - Monitor hemoglobin.  8. Type 2 DM:  - Diet controlled.  - Resume SSI while inpatient.  - Dietary consulted for recommendations. - Dysphagia 1 diet  Expecting discharge to home with family at this point. SW  helping with fine details. Need to replace K+, PO4- today. Will likely need daily supplements at discharge.   DVT prophylaxis: lovenox Code Status: FULL Family Communication: None   Disposition Plan: TBD   Antimicrobials:  . valtrex    Subjective: No acute events ON. He denies complaints this AM.   Objective: Vitals:   05/29/18 1159 05/29/18 1615 05/30/18 0053 05/30/18 0500  BP: 102/68 (!) 167/145 (!) 94/57   Pulse: (!) 124 95 81   Resp: 20 17    Temp: 97.8 F (36.6 C) 98.8 F (37.1 C) (!) 97.4 F (36.3 C)   TempSrc:  Oral Oral   SpO2: (!) 89% 100% 100%   Weight:    71.4 kg  Height:        Intake/Output Summary (Last 24 hours) at 05/30/2018 0716 Last data filed at 05/29/2018 1500 Gross per 24 hour  Intake 213.42 ml  Output 200 ml  Net 13.42 ml   Filed Weights   05/26/18 2354 05/29/18 0531 05/30/18 0500  Weight: 68.6 kg 70.9 kg 71.4 kg    Examination:  General exam:85 y.o.maleAppears calm and comfortable. Interactive this morning.   Respiratory system: Clear to auscultation. Respiratory effort normal. Cardiovascular system:S1 &S2 heard, RRR. No JVD, murmurs, rubs, gallops or clicks. No pedal edema. Gastrointestinal system:Abdomen is nondistended, soft and nontender. No organomegaly or masses felt. Normal bowel sounds heard.. Skin:right shoulder/upper chest rash c/w shingles dry at this time   Data Reviewed: I have personally  reviewed following labs and imaging studies.  CBC: Recent Labs  Lab 05/24/18 1310 05/25/18 0653 05/26/18 0551 05/27/18 0435 05/28/18 0423 05/29/18 0500  WBC 5.0 7.2 8.1 7.6 6.2 4.2  NEUTROABS 3.7  --   --   --  4.3  --   HGB 13.7 10.7* 11.8* 11.1* 12.5* 11.9*  HCT 42.7 32.5* 35.1* 33.1* 36.3* 35.0*  MCV 84.6 82.3 80.9 80.5 79.3* 79.2*  PLT 170 145* 136* 141* 142* 937   Basic Metabolic Panel: Recent Labs  Lab 05/26/18 0551 05/27/18 0435  05/28/18 0423 05/28/18 1500 05/29/18 0500 05/29/18 0811 05/30/18 0500  NA 138  139   < > 139 138 137 138 139  K 3.0* 2.7*   < > 2.8* 3.1* 5.6* 3.8 2.9*  CL 110 108   < > 111 110 109 110 108  CO2 19* 23   < > 19* 20* 21* 20* 22  GLUCOSE 167* 135*   < > 114* 194* 94 90 121*  BUN 15 12   < > 10 10 11 8 13   CREATININE 0.88 0.75   < > 0.71 0.85 0.80 0.73 0.86  CALCIUM 8.6* 8.4*   < > 8.5* 8.0* 8.1* 8.3* 8.7*  MG 1.7 2.0  --  1.8  --  2.0  --  1.9  PHOS  --   --   --  1.1*  --   --   --  1.8*   < > = values in this interval not displayed.   GFR: Estimated Creatinine Clearance: 51.5 mL/min (by C-G formula based on SCr of 0.86 mg/dL). Liver Function Tests: Recent Labs  Lab 05/24/18 1310 05/28/18 0423 05/30/18 0500  AST 23  --   --   ALT 14  --   --   ALKPHOS 54  --   --   BILITOT 0.7  --   --   PROT 5.8*  --   --   ALBUMIN 2.6* 2.1* 1.9*   No results for input(s): LIPASE, AMYLASE in the last 168 hours. No results for input(s): AMMONIA in the last 168 hours. Coagulation Profile: Recent Labs  Lab 05/24/18 1310  INR 1.3*   Cardiac Enzymes: No results for input(s): CKTOTAL, CKMB, CKMBINDEX, TROPONINI in the last 168 hours. BNP (last 3 results) No results for input(s): PROBNP in the last 8760 hours. HbA1C: No results for input(s): HGBA1C in the last 72 hours. CBG: Recent Labs  Lab 05/29/18 1154 05/29/18 1612 05/29/18 1945 05/30/18 0056 05/30/18 0500  GLUCAP 146* 152* 141* 162* 123*   Lipid Profile: No results for input(s): CHOL, HDL, LDLCALC, TRIG, CHOLHDL, LDLDIRECT in the last 72 hours. Thyroid Function Tests: No results for input(s): TSH, T4TOTAL, FREET4, T3FREE, THYROIDAB in the last 72 hours. Anemia Panel: No results for input(s): VITAMINB12, FOLATE, FERRITIN, TIBC, IRON, RETICCTPCT in the last 72 hours. Sepsis Labs: Recent Labs  Lab 05/24/18 1310 05/24/18 1516 05/24/18 2124 05/26/18 0551 05/27/18 0435 05/28/18 0423  PROCALCITON  --   --  1.69 0.60 0.36 0.31  LATICACIDVEN 3.9* 1.7  --   --   --   --     Recent Results (from the past  240 hour(s))  Culture, Urine     Status: None   Collection Time: 05/24/18  2:39 AM  Result Value Ref Range Status   Specimen Description URINE, RANDOM  Final   Special Requests NONE  Final   Culture   Final    NO GROWTH Performed at [facility]  Lab, [address], [place], [place] [address]    Report Status 05/25/2018 FINAL  Final  SARS Coronavirus 2 (CEPHEID - Performed in [place] hospital lab), Hosp Order     Status: None   Collection Time: 05/24/18 12:31 PM  Result Value Ref Range Status   SARS Coronavirus 2 NEGATIVE NEGATIVE Final    Comment: (NOTE) If result is NEGATIVE SARS-CoV-2 target nucleic acids are NOT DETECTED. The SARS-CoV-2 RNA is generally detectable in upper and lower  respiratory specimens during the acute phase of infection. The lowest  concentration of SARS-CoV-2 viral copies this assay can detect is 250  copies / mL. A negative result does not preclude SARS-CoV-2 infection  and should not be used as the sole basis for treatment or other  patient management decisions.  A negative result may occur with  improper specimen collection / handling, submission of specimen other  than nasopharyngeal swab, presence of viral mutation(s) within the  areas targeted by this assay, and inadequate number of viral copies  (<250 copies / mL). A negative result must be combined with clinical  observations, patient history, and epidemiological information. If result is POSITIVE SARS-CoV-2 target nucleic acids are DETECTED. The SARS-CoV-2 RNA is generally detectable in upper and lower  respiratory specimens dur ing the acute phase of infection.  Positive  results are indicative of active infection with SARS-CoV-2.  Clinical  correlation with patient history and other diagnostic information is  necessary to determine patient infection status.  Positive results do  not rule out bacterial infection or co-infection with other viruses. If result is PRESUMPTIVE POSTIVE  SARS-CoV-2 nucleic acids MAY BE PRESENT.   A presumptive positive result was obtained on the submitted specimen  and confirmed on repeat testing.  While 2019 novel coronavirus  (SARS-CoV-2) nucleic acids may be present in the submitted sample  additional confirmatory testing may be necessary for epidemiological  and / or clinical management purposes  to differentiate between  SARS-CoV-2 and other Sarbecovirus currently known to infect humans.  If clinically indicated additional testing with an alternate test  methodology [phone]) is advised. The SARS-CoV-2 RNA is generally  detectable in upper and lower respiratory sp ecimens during the acute  phase of infection. The expected result is Negative. Fact Sheet for Patients:  StrictlyIdeas.no Fact Sheet for Healthcare Providers: BankingDealers.co.za This test is not yet approved or cleared by the [place] FDA and has been authorized for detection and/or diagnosis of SARS-CoV-2 by FDA under an Emergency Use Authorization (EUA).  This EUA will remain in effect (meaning this test can be used) for the duration of the COVID-19 declaration under Section 564(b)(1) of the Act, 21 U.S.C. section 360bbb-3(b)(1), unless the authorization is terminated or revoked sooner. Performed at [facility], [place] [address], [place], [place] [address]   Blood Culture (routine x 2)     Status: None   Collection Time: 05/24/18  1:10 PM  Result Value Ref Range Status   Specimen Description BLOOD LEFT FOREARM  Final   Special Requests AEROBIC BOTTLE ONLY Blood Culture adequate volume  Final   Culture   Final    NO GROWTH 5 DAYS Performed at [facility], [place] [address] [place],  [address]    Report Status 05/29/2018 FINAL  Final  Blood Culture (routine x 2)     Status: None   Collection Time: 05/24/18  1:20 PM  Result Value Ref Range Status   Specimen Description BLOOD RIGHT WRIST  Final    Special Requests   Final    AEROBIC BOTTLE ONLY Blood Culture results may not be optimal due to an inadequate volume of blood received in culture bottles   Culture   Final    NO GROWTH 5 DAYS Performed at [facility], [place] [address], [place], [place] [address]    Report Status 05/29/2018 FINAL  Final  Gastrointestinal Panel by PCR , Stool     Status: None   Collection Time: 05/25/18  2:54 AM  Result Value Ref Range Status   Campylobacter species NOT DETECTED NOT DETECTED Final   Plesimonas shigelloides NOT DETECTED NOT DETECTED Final   Salmonella species NOT DETECTED NOT DETECTED Final   Yersinia enterocolitica NOT DETECTED NOT DETECTED Final   Vibrio species NOT DETECTED NOT DETECTED Final   Vibrio cholerae NOT DETECTED NOT DETECTED Final   Enteroaggregative E coli (EAEC) NOT DETECTED NOT DETECTED Final   Enteropathogenic E coli (EPEC) NOT DETECTED NOT DETECTED Final   Enterotoxigenic E coli (ETEC) NOT DETECTED NOT DETECTED Final   Shiga like toxin producing E coli (STEC) NOT DETECTED NOT DETECTED Final   Shigella/Enteroinvasive E coli (EIEC) NOT DETECTED NOT DETECTED Final   Cryptosporidium NOT DETECTED NOT DETECTED Final   Cyclospora cayetanensis NOT DETECTED NOT DETECTED Final   Entamoeba histolytica NOT DETECTED NOT DETECTED Final   Giardia lamblia NOT DETECTED NOT DETECTED Final   Adenovirus F40/41 NOT DETECTED NOT DETECTED Final   Astrovirus NOT DETECTED NOT DETECTED Final   Norovirus GI/GII NOT DETECTED NOT DETECTED Final   Rotavirus A NOT DETECTED NOT DETECTED Final   Sapovirus (I, II, IV, and V) NOT DETECTED NOT DETECTED Final    Comment: Performed at [facility], [address] [place], [place] [address]  C difficile quick scan w PCR reflex     Status: None   Collection Time: 05/25/18  2:54 AM  Result Value Ref Range Status   C Diff antigen NEGATIVE NEGATIVE Final   C Diff toxin NEGATIVE NEGATIVE Final   C Diff interpretation No C. difficile  detected.  Final    Comment: Performed at [facility], [place] [address], [place], [place] [address]         Radiology Studies: No results found.      Scheduled Meds: . aspirin  81 mg Oral Daily  . brimonidine  1 drop Right Eye BID  . chlorhexidine  15 mL Mouth Rinse BID  . enoxaparin (LOVENOX) injection  40 mg Subcutaneous Q24H  . insulin aspart  0-9 Units Subcutaneous Q4H  . mouth rinse  15 mL Mouth Rinse q12n4p  . potassium & sodium phosphates  1 packet Oral TID WC & HS  . sodium chloride flush  10-40 mL Intracatheter Q12H  . sodium chloride flush  3 mL Intravenous Q12H  . timolol  1 drop Right Eye Daily  . valACYclovir  1,000 mg Oral TID   Continuous Infusions:   LOS: 6 days    Time spent: 25 minutes spent in the coordination of care today.     Jonnie Finner, DO [facility] Pager [phone]  If 7PM-7AM, please contact night-coverage [email or web address] Password [facility] 05/30/2018, 7:16 AM

## 2018-05-30 NOTE — Clinical Social Work Note (Signed)
Left voicemail for daughter. Will discuss SNF placement when she calls back.  Dayton Scrape, [place]

## 2018-05-31 LAB — RENAL FUNCTION PANEL
Albumin: 1.9 g/dL — ABNORMAL LOW (ref 3.5–5.0)
Anion gap: 9 (ref 5–15)
BUN: 15 mg/dL (ref 8–23)
CO2: 23 mmol/L (ref 22–32)
Calcium: 8.5 mg/dL — ABNORMAL LOW (ref 8.9–10.3)
Chloride: 110 mmol/L (ref 98–111)
Creatinine, Ser: 0.83 mg/dL (ref 0.61–1.24)
GFR calc Af Amer: >60 mL/min (ref >60)
GFR calc non Af Amer: >60 mL/min (ref >60)
Glucose, Bld: 77 mg/dL (ref 70–99)
Phosphorus: 2.5 mg/dL (ref 2.5–4.6)
Potassium: 3.3 mmol/L — ABNORMAL LOW (ref 3.5–5.1)
Sodium: 142 mmol/L (ref 135–145)

## 2018-05-31 LAB — GLUCOSE, CAPILLARY
Glucose-Capillary: 116 mg/dL — ABNORMAL HIGH (ref 70–99)
Glucose-Capillary: 119 mg/dL — ABNORMAL HIGH (ref 70–99)
Glucose-Capillary: 144 mg/dL — ABNORMAL HIGH (ref 70–99)
Glucose-Capillary: 157 mg/dL — ABNORMAL HIGH (ref 70–99)
Glucose-Capillary: 162 mg/dL — ABNORMAL HIGH (ref 70–99)
Glucose-Capillary: 58 mg/dL — ABNORMAL LOW (ref 70–99)
Glucose-Capillary: 74 mg/dL (ref 70–99)
Glucose-Capillary: 96 mg/dL (ref 70–99)

## 2018-05-31 LAB — CBC WITH DIFFERENTIAL/PLATELET
Abs Immature Granulocytes: 0.04 10*3/uL (ref 0.00–0.07)
Basophils Absolute: 0 10*3/uL (ref 0.0–0.1)
Basophils Relative: 0 %
Eosinophils Absolute: 0.2 10*3/uL (ref 0.0–0.5)
Eosinophils Relative: 4 %
HCT: 32.4 % — ABNORMAL LOW (ref 39.0–52.0)
Hemoglobin: 11 g/dL — ABNORMAL LOW (ref 13.0–17.0)
Immature Granulocytes: 1 %
Lymphocytes Relative: 31 %
Lymphs Abs: 1.4 10*3/uL (ref 0.7–4.0)
MCH: 27 pg (ref 26.0–34.0)
MCHC: 34 g/dL (ref 30.0–36.0)
MCV: 79.6 fL — ABNORMAL LOW (ref 80.0–100.0)
Monocytes Absolute: 0.6 10*3/uL (ref 0.1–1.0)
Monocytes Relative: 14 %
Neutro Abs: 2.2 10*3/uL (ref 1.7–7.7)
Neutrophils Relative %: 50 %
Platelets: 167 10*3/uL (ref 150–400)
RBC: 4.07 MIL/uL — ABNORMAL LOW (ref 4.22–5.81)
RDW: 13.8 % (ref 11.5–15.5)
WBC: 4.4 10*3/uL (ref 4.0–10.5)
nRBC: 0 % (ref 0.0–0.2)

## 2018-05-31 LAB — MAGNESIUM: Magnesium: 1.8 mg/dL (ref 1.7–2.4)

## 2018-05-31 NOTE — Progress Notes (Signed)
  Speech Language Pathology Treatment: Dysphagia  Patient Details Name: Curtis Brock. MRN: [id number] DOB: January 20, 1925 Today's Date: 05/31/2018 Time: 7096-2836 SLP Time Calculation (min) (ACUTE ONLY): 30 min  Assessment / Plan / Recommendation Clinical Impression  Assisted pt with breakfast.  Requires total and careful hand-feeding.  Positioned with HOB as high as possible.  Pt able to answer some basic questions (name, place of birth).  He continues to present with consistent swallow behaviors as described in prior notes.  Requires ample time to manipulate solids orally; tends to retain material throughout oral cavity.  Sips of nectar liquids swallowed without overt difficulty until end of meal, at which time he presented with strong coughing for several minutes.  Ceased feeding and provided oral suctioning.  Lung sounds have remained diminished without worsening over last several days.  No current CXR.  Pt appears to be stable on current diet - Dysphagia 1, nectars will likely be the safest diet for him at the time of D/C to SNF.    HPI HPI: Patient is a 85 year old male with PMH:  DM, left eye blindness, PVD, hypertension, who presented to hospital with AMS, he was found to be in AKI, with SIRS, and with right upper chest shingles. Patient has h/o dysphagia with most recent MBS in 2018 and recommending Dys 2/3, thin liquids.      SLP Plan  Continue with current plan of care       Recommendations  Diet recommendations: Dysphagia 1 (puree);Nectar-thick liquid Liquids provided via: Straw Medication Administration: Crushed with puree Supervision: Staff to assist with self feeding;Full supervision/cueing for compensatory strategies Compensations: Minimize environmental distractions;Slow rate;Small sips/bites Postural Changes and/or Swallow Maneuvers: Seated upright 90 degrees                Oral Care Recommendations: Oral care BID Follow up Recommendations: 24 hour  supervision/assistance;Skilled Nursing facility SLP Visit Diagnosis: Dysphagia, unspecified (R13.10) Plan: Continue with current plan of care       GO                Juan Quam Laurice 05/31/2018, 10:28 AM  Estill Bamberg L. Tivis Ringer, [place] Office number [phone] Pager [phone]

## 2018-05-31 NOTE — Progress Notes (Addendum)
[name]  PROGRESS NOTE    Curtis Brock.  YHC:[id number] DOB: October 19, 1925 DOA: 05/24/2018 PCP: [facility]   Brief Narrative:   85 year old with prior h/o of DM, left eye blindness, PVD, hypertension, presents with AMS, he was found to be in AKI, with SIRS, and with right upper chest shingles.  Assessment & Plan:   Principal Problem:   Sepsis ([place]) Active Problems:   Essential hypertension, benign   Altered mental status   Diabetes type 2, controlled ([place])   Sacral decubitus ulcer   Acute renal failure syndrome (H[facility])   Shingles   1. Sepsis  - Unclear source. Lactate normalized.  - CXR does not show pneumonia.  - UCx negative - Blood cultures have been negative so far.  - Pt had diarrhea but GI pathogen PCR and c diff PCR is negative.  - was treated with IV vancomycin, cefepime and flagyl; but where stopped as there was no clear indication of what was being treated and a negative procal     - from a sepsis standpoint, that has been treated is is most likely d/t his shingles -changed to valtrex  2. Essential Hypertension:  - stable off meds, monitor   3. AKI: - resolved  4. Stage 2 sacral decubitus ulcer:  - Wound care consulted and recommendations given.   5. Shingles:  - Vesicular lesions on the right upper chest on admission, which had decreased in size, slightly crusted. They need to be covered.  - Currently on IV acyclovir. - rash now dried and crusting over - as PO intake improves, transition to PO valtrex  6. Hypokalemia/hypophosphatemia - continue K-phos; this will likely need to be a daily supplement  7. Mild anemia of chronic disease:  - Monitor hemoglobin.  8. Type 2 DM:  - Diet controlled.  - Resume SSI while inpatient.  - Dietary consulted for recommendations. - Dysphagia 1 diet   DVT prophylaxis: lovenox Code Status: FULL Family  Communication: None   Disposition Plan: To SNF   Antimicrobials:  . valtrex    Subjective: No acute events ON.   Objective: Vitals:   05/30/18 0802 05/30/18 0804 05/30/18 2346 05/31/18 0624  BP: (!) 83/32 102/67 112/65   Pulse: 82 84 88   Resp:   20   Temp: (!) 97.5 F (36.4 C)     TempSrc: Oral     SpO2: 100%  99%   Weight:    73.6 kg  Height:        Intake/Output Summary (Last 24 hours) at 05/31/2018 0706 Last data filed at 05/31/2018 0500 Gross per 24 hour  Intake 613 ml  Output 400 ml  Net 213 ml   Filed Weights   05/29/18 0531 05/30/18 0500 05/31/18 0624  Weight: 70.9 kg 71.4 kg 73.6 kg    Examination:  General exam:85 y.o.maleAppears calm and comfortable. Interactive this morning.   Respiratory system: Clear to auscultation. Respiratory effort normal. Cardiovascular system:S1 &S2 heard, RRR. No JVD, murmurs, rubs, gallops or clicks. No pedal edema. Gastrointestinal system:Abdomen is nondistended, soft and nontender. No organomegaly or masses felt. Normal bowel sounds heard.. Skin:right shoulder/upper chest rash c/w shingles dry at this time Neuro: calm, cooperative, follows some commands, but is slow    Data Reviewed: I have personally reviewed following labs and imaging studies.  CBC: Recent Labs  Lab 05/24/18 1310  05/26/18 0551 05/27/18 0435 05/28/18 0423 05/29/18 0500 05/31/18 0500  WBC 5.0   < > 8.1  7.6 6.2 4.2 4.4  NEUTROABS 3.7  --   --   --  4.3  --  2.2  HGB 13.7   < > 11.8* 11.1* 12.5* 11.9* 11.0*  HCT 42.7   < > 35.1* 33.1* 36.3* 35.0* 32.4*  MCV 84.6   < > 80.9 80.5 79.3* 79.2* 79.6*  PLT 170   < > 136* 141* 142* 161 167   < > = values in this interval not displayed.   Basic Metabolic Panel: Recent Labs  Lab 05/26/18 0551 05/27/18 0435  05/28/18 0423 05/28/18 1500 05/29/18 0500 05/29/18 0811 05/30/18 0500  NA 138 139   < > 139 138 137 138 139  K 3.0* 2.7*   < > 2.8* 3.1* 5.6* 3.8 2.9*  CL 110 108   < > 111 110 109  110 108  CO2 19* 23   < > 19* 20* 21* 20* 22  GLUCOSE 167* 135*   < > 114* 194* 94 90 121*  BUN 15 12   < > 10 10 11 8 13   CREATININE 0.88 0.75   < > 0.71 0.85 0.80 0.73 0.86  CALCIUM 8.6* 8.4*   < > 8.5* 8.0* 8.1* 8.3* 8.7*  MG 1.7 2.0  --  1.8  --  2.0  --  1.9  PHOS  --   --   --  1.1*  --   --   --  1.8*   < > = values in this interval not displayed.   GFR: Estimated Creatinine Clearance: 51.5 mL/min (by C-G formula based on SCr of 0.86 mg/dL). Liver Function Tests: Recent Labs  Lab 05/24/18 1310 05/28/18 0423 05/30/18 0500  AST 23  --   --   ALT 14  --   --   ALKPHOS 54  --   --   BILITOT 0.7  --   --   PROT 5.8*  --   --   ALBUMIN 2.6* 2.1* 1.9*   No results for input(s): LIPASE, AMYLASE in the last 168 hours. No results for input(s): AMMONIA in the last 168 hours. Coagulation Profile: Recent Labs  Lab 05/24/18 1310  INR 1.3*   Cardiac Enzymes: No results for input(s): CKTOTAL, CKMB, CKMBINDEX, TROPONINI in the last 168 hours. BNP (last 3 results) No results for input(s): PROBNP in the last 8760 hours. HbA1C: No results for input(s): HGBA1C in the last 72 hours. CBG: Recent Labs  Lab 05/30/18 1213 05/30/18 1558 05/30/18 2057 05/31/18 0004 05/31/18 0416  GLUCAP 147* 142* 178* 162* 96   Lipid Profile: No results for input(s): CHOL, HDL, LDLCALC, TRIG, CHOLHDL, LDLDIRECT in the last 72 hours. Thyroid Function Tests: No results for input(s): TSH, T4TOTAL, FREET4, T3FREE, THYROIDAB in the last 72 hours. Anemia Panel: No results for input(s): VITAMINB12, FOLATE, FERRITIN, TIBC, IRON, RETICCTPCT in the last 72 hours. Sepsis Labs: Recent Labs  Lab 05/24/18 1310 05/24/18 1516 05/24/18 2124 05/26/18 0551 05/27/18 0435 05/28/18 0423  PROCALCITON  --   --  1.69 0.60 0.36 0.31  LATICACIDVEN 3.9* 1.7  --   --   --   --     Recent Results (from the past 240 hour(s))  Culture, Urine     Status: None   Collection Time: 05/24/18  2:39 AM  Result Value Ref  Range Status   Specimen Description URINE, RANDOM  Final   Special Requests NONE  Final   Culture   Final    NO GROWTH Performed at [facility]  Lab, [address] [place], [place] [address]    Report Status 05/25/2018 FINAL  Final  SARS Coronavirus 2 (CEPHEID - Performed in [place] hospital lab), Hosp Order     Status: None   Collection Time: 05/24/18 12:31 PM  Result Value Ref Range Status   SARS Coronavirus 2 NEGATIVE NEGATIVE Final    Comment: (NOTE) If result is NEGATIVE SARS-CoV-2 target nucleic acids are NOT DETECTED. The SARS-CoV-2 RNA is generally detectable in upper and lower  respiratory specimens during the acute phase of infection. The lowest  concentration of SARS-CoV-2 viral copies this assay can detect is 250  copies / mL. A negative result does not preclude SARS-CoV-2 infection  and should not be used as the sole basis for treatment or other  patient management decisions.  A negative result may occur with  improper specimen collection / handling, submission of specimen other  than nasopharyngeal swab, presence of viral mutation(s) within the  areas targeted by this assay, and inadequate number of viral copies  (<250 copies / mL). A negative result must be combined with clinical  observations, patient history, and epidemiological information. If result is POSITIVE SARS-CoV-2 target nucleic acids are DETECTED. The SARS-CoV-2 RNA is generally detectable in upper and lower  respiratory specimens dur ing the acute phase of infection.  Positive  results are indicative of active infection with SARS-CoV-2.  Clinical  correlation with patient history and other diagnostic information is  necessary to determine patient infection status.  Positive results do  not rule out bacterial infection or co-infection with other viruses. If result is PRESUMPTIVE POSTIVE SARS-CoV-2 nucleic acids MAY BE PRESENT.   A presumptive positive result was obtained on the submitted  specimen  and confirmed on repeat testing.  While 2019 novel coronavirus  (SARS-CoV-2) nucleic acids may be present in the submitted sample  additional confirmatory testing may be necessary for epidemiological  and / or clinical management purposes  to differentiate between  SARS-CoV-2 and other Sarbecovirus currently known to infect humans.  If clinically indicated additional testing with an alternate test  methodology [phone]) is advised. The SARS-CoV-2 RNA is generally  detectable in upper and lower respiratory sp ecimens during the acute  phase of infection. The expected result is Negative. Fact Sheet for Patients:  StrictlyIdeas.no Fact Sheet for Healthcare Providers: BankingDealers.co.za This test is not yet approved or cleared by the [place] FDA and has been authorized for detection and/or diagnosis of SARS-CoV-2 by FDA under an Emergency Use Authorization (EUA).  This EUA will remain in effect (meaning this test can be used) for the duration of the COVID-19 declaration under Section 564(b)(1) of the Act, 21 U.S.C. section 360bbb-3(b)(1), unless the authorization is terminated or revoked sooner. Performed at [facility], [place] [address], [place], [place] [address]   Blood Culture (routine x 2)     Status: None   Collection Time: 05/24/18  1:10 PM  Result Value Ref Range Status   Specimen Description BLOOD LEFT FOREARM  Final   Special Requests AEROBIC BOTTLE ONLY Blood Culture adequate volume  Final   Culture   Final    NO GROWTH 5 DAYS Performed at [facility], [place] [address], [place], [place] [address]    Report Status 05/29/2018 FINAL  Final  Blood Culture (routine x 2)     Status: None   Collection Time: 05/24/18  1:20 PM  Result Value Ref Range Status   Specimen Description BLOOD RIGHT WRIST  Final   Special Requests   Final    AEROBIC BOTTLE ONLY Blood Culture results may not be optimal due to an  inadequate volume of blood received in culture bottles   Culture   Final    NO GROWTH 5 DAYS Performed at [facility], [place] [address] [place], [place] [address]    Report Status 05/29/2018 FINAL  Final  Gastrointestinal Panel by PCR , Stool     Status: None   Collection Time: 05/25/18  2:54 AM  Result Value Ref Range Status   Campylobacter species NOT DETECTED NOT DETECTED Final   Plesimonas shigelloides NOT DETECTED NOT DETECTED Final   Salmonella species NOT DETECTED NOT DETECTED Final   Yersinia enterocolitica NOT DETECTED NOT DETECTED Final   Vibrio species NOT DETECTED NOT DETECTED Final   Vibrio cholerae NOT DETECTED NOT DETECTED Final   Enteroaggregative E coli (EAEC) NOT DETECTED NOT DETECTED Final   Enteropathogenic E coli (EPEC) NOT DETECTED NOT DETECTED Final   Enterotoxigenic E coli (ETEC) NOT DETECTED NOT DETECTED Final   Shiga like toxin producing E coli (STEC) NOT DETECTED NOT DETECTED Final   Shigella/Enteroinvasive E coli (EIEC) NOT DETECTED NOT DETECTED Final   Cryptosporidium NOT DETECTED NOT DETECTED Final   Cyclospora cayetanensis NOT DETECTED NOT DETECTED Final   Entamoeba histolytica NOT DETECTED NOT DETECTED Final   Giardia lamblia NOT DETECTED NOT DETECTED Final   Adenovirus F40/41 NOT DETECTED NOT DETECTED Final   Astrovirus NOT DETECTED NOT DETECTED Final   Norovirus GI/GII NOT DETECTED NOT DETECTED Final   Rotavirus A NOT DETECTED NOT DETECTED Final   Sapovirus (I, II, IV, and V) NOT DETECTED NOT DETECTED Final    Comment: Performed at [facility], [place] [place], [place] [address]  C difficile quick scan w PCR reflex     Status: None   Collection Time: 05/25/18  2:54 AM  Result Value Ref Range Status   C Diff antigen NEGATIVE NEGATIVE Final   C Diff toxin NEGATIVE NEGATIVE Final   C Diff interpretation No C. difficile detected.  Final    Comment: Performed at [facility], [place] [address], [place], [place] [address]          Radiology Studies: No results found.      Scheduled Meds: . aspirin  81 mg Oral Daily  . brimonidine  1 drop Right Eye BID  . chlorhexidine  15 mL Mouth Rinse BID  . enoxaparin (LOVENOX) injection  40 mg Subcutaneous Q24H  . insulin aspart  0-9 Units Subcutaneous Q4H  . mouth rinse  15 mL Mouth Rinse q12n4p  . potassium & sodium phosphates  1 packet Oral TID WC & HS  . sodium chloride flush  10-40 mL Intracatheter Q12H  . sodium chloride flush  3 mL Intravenous Q12H  . timolol  1 drop Right Eye Daily  . valACYclovir  1,000 mg Oral TID   Continuous Infusions:   LOS: 7 days    Time spent: 25 minutes spent in the coordination of care today.    Jonnie Finner, DO [facility] Pager [phone]  If 7PM-7AM, please contact night-coverage [email or web address] Password [email or web address] 05/31/2018, 7:06 AM

## 2018-05-31 NOTE — Progress Notes (Signed)
Daily Nursing Note  Received report from [place], Therapist, sports. Patient resting bed comfortably upon assessment. Denies pain. Noted to have some edema throughout. BS 58 upon morning glucometer read, given some applesauce with correction to 74. Touched base with pharmacy to verify valtrex was a crushable medication, which it is. Turned Q2H. Shingles dressing changed on R upper chest well, vesicles appear dry and crushed over. Patients daughter, Cherene Julian updated via telephone. All patient needs met throughout the day.

## 2018-06-01 LAB — RENAL FUNCTION PANEL
Albumin: 1.9 g/dL — ABNORMAL LOW (ref 3.5–5.0)
Anion gap: 5 (ref 5–15)
BUN: 17 mg/dL (ref 8–23)
CO2: 25 mmol/L (ref 22–32)
Calcium: 8.2 mg/dL — ABNORMAL LOW (ref 8.9–10.3)
Chloride: 112 mmol/L — ABNORMAL HIGH (ref 98–111)
Creatinine, Ser: 0.98 mg/dL (ref 0.61–1.24)
GFR calc Af Amer: >60 mL/min (ref >60)
GFR calc non Af Amer: >60 mL/min (ref >60)
Glucose, Bld: 105 mg/dL — ABNORMAL HIGH (ref 70–99)
Phosphorus: 3.2 mg/dL (ref 2.5–4.6)
Potassium: 3.5 mmol/L (ref 3.5–5.1)
Sodium: 142 mmol/L (ref 135–145)

## 2018-06-01 LAB — CBC WITH DIFFERENTIAL/PLATELET
Abs Immature Granulocytes: 0.04 10*3/uL (ref 0.00–0.07)
Basophils Absolute: 0 10*3/uL (ref 0.0–0.1)
Basophils Relative: 0 %
Eosinophils Absolute: 0.1 10*3/uL (ref 0.0–0.5)
Eosinophils Relative: 2 %
HCT: 32.1 % — ABNORMAL LOW (ref 39.0–52.0)
Hemoglobin: 10.6 g/dL — ABNORMAL LOW (ref 13.0–17.0)
Immature Granulocytes: 1 %
Lymphocytes Relative: 24 %
Lymphs Abs: 1.3 10*3/uL (ref 0.7–4.0)
MCH: 26.4 pg (ref 26.0–34.0)
MCHC: 33 g/dL (ref 30.0–36.0)
MCV: 80 fL (ref 80.0–100.0)
Monocytes Absolute: 0.7 10*3/uL (ref 0.1–1.0)
Monocytes Relative: 12 %
Neutro Abs: 3.3 10*3/uL (ref 1.7–7.7)
Neutrophils Relative %: 61 %
Platelets: 171 10*3/uL (ref 150–400)
RBC: 4.01 MIL/uL — ABNORMAL LOW (ref 4.22–5.81)
RDW: 13.8 % (ref 11.5–15.5)
WBC: 5.4 10*3/uL (ref 4.0–10.5)
nRBC: 0 % (ref 0.0–0.2)

## 2018-06-01 LAB — MAGNESIUM: Magnesium: 1.8 mg/dL (ref 1.7–2.4)

## 2018-06-01 LAB — GLUCOSE, CAPILLARY
Glucose-Capillary: 117 mg/dL — ABNORMAL HIGH (ref 70–99)
Glucose-Capillary: 124 mg/dL — ABNORMAL HIGH (ref 70–99)
Glucose-Capillary: 141 mg/dL — ABNORMAL HIGH (ref 70–99)
Glucose-Capillary: 152 mg/dL — ABNORMAL HIGH (ref 70–99)
Glucose-Capillary: 155 mg/dL — ABNORMAL HIGH (ref 70–99)
Glucose-Capillary: 91 mg/dL (ref 70–99)

## 2018-06-01 LAB — SARS CORONAVIRUS 2 BY RT PCR (HOSPITAL ORDER, PERFORMED IN ~~LOC~~ HOSPITAL LAB): SARS Coronavirus 2: NEGATIVE

## 2018-06-01 MED ORDER — INSULIN ASPART 100 UNIT/ML ~~LOC~~ SOLN
0.0000 [IU] | Freq: Three times a day (TID) | SUBCUTANEOUS | Status: DC
  Administered 2018-06-01: 2 [IU] via SUBCUTANEOUS
  Administered 2018-06-01 (×2): 1 [IU] via SUBCUTANEOUS
  Administered 2018-06-02 – 2018-06-04 (×2): 2 [IU] via SUBCUTANEOUS

## 2018-06-01 NOTE — Progress Notes (Addendum)
[name]  PROGRESS NOTE    Curtis Brock.  SJG:[id number] DOB: August 30, 1925 DOA: 05/24/2018 PCP: [facility]   Brief Narrative:   85 year old with prior h/o of DM, left eye blindness, PVD, hypertension, presents with AMS, he was found to be in AKI, with SIRS, and with right upper chest shingles.  Assessment & Plan:   Principal Problem:   Sepsis ([place]) Active Problems:   Essential hypertension, benign   Altered mental status   Diabetes type 2, controlled ([place])   Sacral decubitus ulcer   Acute renal failure syndrome (H[facility])   Shingles   1. Sepsis  - Unclear source. Lactate normalized.  - CXR does not show pneumonia.  - UCx negative - Blood cultures have been negative so far.  - Pt had diarrhea but GI pathogen PCR and c diff PCR is negative.  - was treated withIV vancomycin, cefepime and flagyl; but where stopped as there was no clear indication of what was being treated and a negative procal - from a sepsis standpoint, that has been treated is is most likely d/t his shingles -changedto valtrex  2. Essential Hypertension:  - stable off meds, monitor   3. AKI: - resolved  4. Stage 2 sacral decubitus ulcer:  - Wound care consulted and recommendations given.   5. Shingles:  - Vesicular lesions on the right upper chest on admission, which had decreased in size, slightly crusted. They need to be covered.  - Currently on IV acyclovir. - rash now dried and crusting over - as PO intake improves, transition to PO valtrex  6. Hypokalemia/hypophosphatemia - continue K-phos; this will likely need to be a daily supplement     - resolved  7. Mild anemia of chronic disease:  - Monitor hemoglobin.  8. Type 2 DM:  - Diet controlled.  - Resume SSI while inpatient.  - Dietary consulted for recommendations. - Dysphagia 1 diet   DVT prophylaxis: lovenox Code Status: FULL  Family Communication: None   Disposition Plan: To SNF   Antimicrobials:  . valtrex    Subjective: No acute events ON.   Objective: Vitals:   05/31/18 2054 05/31/18 2246 06/01/18 0404 06/01/18 0800  BP: 126/69 118/74  (!) 142/83  Pulse: (!) 106 99  (!) 104  Resp: 18 12    Temp: 97.9 F (36.6 C) 97.9 F (36.6 C)  97.8 F (36.6 C)  TempSrc: Oral Oral  Oral  SpO2: 99% 100%  97%  Weight:   72.8 kg   Height:        Intake/Output Summary (Last 24 hours) at 06/01/2018 1540 Last data filed at 06/01/2018 0747 Gross per 24 hour  Intake 46 ml  Output 375 ml  Net -329 ml   Filed Weights   05/30/18 0500 05/31/18 0624 06/01/18 0404  Weight: 71.4 kg 73.6 kg 72.8 kg    Examination:  General exam:85 y.o.maleAppears calm and comfortable. Respiratory system: Clear to auscultation. Respiratory effort normal. Cardiovascular system:S1 &S2 heard, RRR. No JVD, murmurs, rubs, gallops or clicks. No pedal edema. Gastrointestinal system:Abdomen is nondistended, soft and nontender. No organomegaly or masses felt. Normal bowel sounds heard.. Skin:right shoulder/upper chest rash c/w shingles dry at this time Neuro: calm, cooperative, follows some commands, but is slow    Data Reviewed: I have personally reviewed following labs and imaging studies.  CBC: Recent Labs  Lab 05/27/18 0435 05/28/18 0423 05/29/18 0500 05/31/18 0500 06/01/18 0630  WBC 7.6 6.2 4.2 4.4 5.4  NEUTROABS  --  4.3  --  2.2 3.3  HGB 11.1* 12.5* 11.9* 11.0* 10.6*  HCT 33.1* 36.3* 35.0* 32.4* 32.1*  MCV 80.5 79.3* 79.2* 79.6* 80.0  PLT 141* 142* 161 167 833   Basic Metabolic Panel: Recent Labs  Lab 05/28/18 0423  05/29/18 0500 05/29/18 0811 05/30/18 0500 05/31/18 0500 06/01/18 0630  NA 139   < > 137 138 139 142 142  K 2.8*   < > 5.6* 3.8 2.9* 3.3* 3.5  CL 111   < > 109 110 108 110 112*  CO2 19*   < > 21* 20* 22 23 25   GLUCOSE 114*   < > 94 90 121* 77 105*  BUN 10   < > 11 8 13 15 17   CREATININE  0.71   < > 0.80 0.73 0.86 0.83 0.98  CALCIUM 8.5*   < > 8.1* 8.3* 8.7* 8.5* 8.2*  MG 1.8  --  2.0  --  1.9 1.8 1.8  PHOS 1.1*  --   --   --  1.8* 2.5 3.2   < > = values in this interval not displayed.   GFR: Estimated Creatinine Clearance: 45.2 mL/min (by C-G formula based on SCr of 0.98 mg/dL). Liver Function Tests: Recent Labs  Lab 05/28/18 0423 05/30/18 0500 05/31/18 0500 06/01/18 0630  ALBUMIN 2.1* 1.9* 1.9* 1.9*   No results for input(s): LIPASE, AMYLASE in the last 168 hours. No results for input(s): AMMONIA in the last 168 hours. Coagulation Profile: No results for input(s): INR, PROTIME in the last 168 hours. Cardiac Enzymes: No results for input(s): CKTOTAL, CKMB, CKMBINDEX, TROPONINI in the last 168 hours. BNP (last 3 results) No results for input(s): PROBNP in the last 8760 hours. HbA1C: No results for input(s): HGBA1C in the last 72 hours. CBG: Recent Labs  Lab 05/31/18 2018 05/31/18 2338 06/01/18 0340 06/01/18 0749 06/01/18 1158  GLUCAP 144* 157* 117* 91 124*   Lipid Profile: No results for input(s): CHOL, HDL, LDLCALC, TRIG, CHOLHDL, LDLDIRECT in the last 72 hours. Thyroid Function Tests: No results for input(s): TSH, T4TOTAL, FREET4, T3FREE, THYROIDAB in the last 72 hours. Anemia Panel: No results for input(s): VITAMINB12, FOLATE, FERRITIN, TIBC, IRON, RETICCTPCT in the last 72 hours. Sepsis Labs: Recent Labs  Lab 05/26/18 0551 05/27/18 0435 05/28/18 0423  PROCALCITON 0.60 0.36 0.31    Recent Results (from the past 240 hour(s))  Culture, Urine     Status: None   Collection Time: 05/24/18  2:39 AM  Result Value Ref Range Status   Specimen Description URINE, RANDOM  Final   Special Requests NONE  Final   Culture   Final    NO GROWTH Performed at [facility], [address], [place], [place] [address]    Report Status 05/25/2018 FINAL  Final  SARS Coronavirus 2 (CEPHEID - Performed in [facility] lab), Hosp Order     Status:  None   Collection Time: 05/24/18 12:31 PM  Result Value Ref Range Status   SARS Coronavirus 2 NEGATIVE NEGATIVE Final    Comment: (NOTE) If result is NEGATIVE SARS-CoV-2 target nucleic acids are NOT DETECTED. The SARS-CoV-2 RNA is generally detectable in upper and lower  respiratory specimens during the acute phase of infection. The lowest  concentration of SARS-CoV-2 viral copies this assay can detect is 250  copies / mL. A negative result does not preclude SARS-CoV-2 infection  and should not be used as the sole basis for treatment or other  patient management decisions.  A negative result may occur with  improper specimen collection / handling, submission of specimen other  than nasopharyngeal swab, presence of viral mutation(s) within the  areas targeted by this assay, and inadequate number of viral copies  (<250 copies / mL). A negative result must be combined with clinical  observations, patient history, and epidemiological information. If result is POSITIVE SARS-CoV-2 target nucleic acids are DETECTED. The SARS-CoV-2 RNA is generally detectable in upper and lower  respiratory specimens dur ing the acute phase of infection.  Positive  results are indicative of active infection with SARS-CoV-2.  Clinical  correlation with patient history and other diagnostic information is  necessary to determine patient infection status.  Positive results do  not rule out bacterial infection or co-infection with other viruses. If result is PRESUMPTIVE POSTIVE SARS-CoV-2 nucleic acids MAY BE PRESENT.   A presumptive positive result was obtained on the submitted specimen  and confirmed on repeat testing.  While 2019 novel coronavirus  (SARS-CoV-2) nucleic acids may be present in the submitted sample  additional confirmatory testing may be necessary for epidemiological  and / or clinical management purposes  to differentiate between  SARS-CoV-2 and other Sarbecovirus currently known to infect  humans.  If clinically indicated additional testing with an alternate test  methodology [phone]) is advised. The SARS-CoV-2 RNA is generally  detectable in upper and lower respiratory sp ecimens during the acute  phase of infection. The expected result is Negative. Fact Sheet for Patients:  StrictlyIdeas.no Fact Sheet for Healthcare Providers: BankingDealers.co.za This test is not yet approved or cleared by the [place] FDA and has been authorized for detection and/or diagnosis of SARS-CoV-2 by FDA under an Emergency Use Authorization (EUA).  This EUA will remain in effect (meaning this test can be used) for the duration of the COVID-19 declaration under Section 564(b)(1) of the Act, 21 U.S.C. section 360bbb-3(b)(1), unless the authorization is terminated or revoked sooner. Performed at [facility], [place] [address], [place], [place] [address]   Blood Culture (routine x 2)     Status: None   Collection Time: 05/24/18  1:10 PM  Result Value Ref Range Status   Specimen Description BLOOD LEFT FOREARM  Final   Special Requests AEROBIC BOTTLE ONLY Blood Culture adequate volume  Final   Culture   Final    NO GROWTH 5 DAYS Performed at [facility], [place] [address], [place], [place] [address]    Report Status 05/29/2018 FINAL  Final  Blood Culture (routine x 2)     Status: None   Collection Time: 05/24/18  1:20 PM  Result Value Ref Range Status   Specimen Description BLOOD RIGHT WRIST  Final   Special Requests   Final    AEROBIC BOTTLE ONLY Blood Culture results may not be optimal due to an inadequate volume of blood received in culture bottles   Culture   Final    NO GROWTH 5 DAYS Performed at [facility], [place] [address], [place], [place] [address]    Report Status 05/29/2018 FINAL  Final  Gastrointestinal Panel by PCR , Stool     Status: None   Collection Time: 05/25/18  2:54 AM  Result Value Ref Range Status    Campylobacter species NOT DETECTED NOT DETECTED Final   Plesimonas shigelloides NOT DETECTED NOT DETECTED Final   Salmonella species NOT DETECTED NOT DETECTED Final   Yersinia enterocolitica NOT DETECTED NOT DETECTED Final   Vibrio species NOT DETECTED NOT  DETECTED Final   Vibrio cholerae NOT DETECTED NOT DETECTED Final   Enteroaggregative E coli (EAEC) NOT DETECTED NOT DETECTED Final   Enteropathogenic E coli (EPEC) NOT DETECTED NOT DETECTED Final   Enterotoxigenic E coli (ETEC) NOT DETECTED NOT DETECTED Final   Shiga like toxin producing E coli (STEC) NOT DETECTED NOT DETECTED Final   Shigella/Enteroinvasive E coli (EIEC) NOT DETECTED NOT DETECTED Final   Cryptosporidium NOT DETECTED NOT DETECTED Final   Cyclospora cayetanensis NOT DETECTED NOT DETECTED Final   Entamoeba histolytica NOT DETECTED NOT DETECTED Final   Giardia lamblia NOT DETECTED NOT DETECTED Final   Adenovirus F40/41 NOT DETECTED NOT DETECTED Final   Astrovirus NOT DETECTED NOT DETECTED Final   Norovirus GI/GII NOT DETECTED NOT DETECTED Final   Rotavirus A NOT DETECTED NOT DETECTED Final   Sapovirus (I, II, IV, and V) NOT DETECTED NOT DETECTED Final    Comment: Performed at [facility], [place] [place], [place] [address]  C difficile quick scan w PCR reflex     Status: None   Collection Time: 05/25/18  2:54 AM  Result Value Ref Range Status   C Diff antigen NEGATIVE NEGATIVE Final   C Diff toxin NEGATIVE NEGATIVE Final   C Diff interpretation No C. difficile detected.  Final    Comment: Performed at [facility], [place] [address] [place],  [address]  SARS Coronavirus 2 (CEPHEID - Performed in [place] hospital lab), Hosp Order     Status: None   Collection Time: 06/01/18 11:18 AM  Result Value Ref Range Status   SARS Coronavirus 2 NEGATIVE NEGATIVE Final    Comment: (NOTE) If result is NEGATIVE SARS-CoV-2 target nucleic acids are NOT DETECTED. The SARS-CoV-2 RNA is generally  detectable in upper and lower  respiratory specimens during the acute phase of infection. The lowest  concentration of SARS-CoV-2 viral copies this assay can detect is 250  copies / mL. A negative result does not preclude SARS-CoV-2 infection  and should not be used as the sole basis for treatment or other  patient management decisions.  A negative result may occur with  improper specimen collection / handling, submission of specimen other  than nasopharyngeal swab, presence of viral mutation(s) within the  areas targeted by this assay, and inadequate number of viral copies  (<250 copies / mL). A negative result must be combined with clinical  observations, patient history, and epidemiological information. If result is POSITIVE SARS-CoV-2 target nucleic acids are DETECTED. The SARS-CoV-2 RNA is generally detectable in upper and lower  respiratory specimens dur ing the acute phase of infection.  Positive  results are indicative of active infection with SARS-CoV-2.  Clinical  correlation with patient history and other diagnostic information is  necessary to determine patient infection status.  Positive results do  not rule out bacterial infection or co-infection with other viruses. If result is PRESUMPTIVE POSTIVE SARS-CoV-2 nucleic acids MAY BE PRESENT.   A presumptive positive result was obtained on the submitted specimen  and confirmed on repeat testing.  While 2019 novel coronavirus  (SARS-CoV-2) nucleic acids may be present in the submitted sample  additional confirmatory testing may be necessary for epidemiological  and / or clinical management purposes  to differentiate between  SARS-CoV-2 and other Sarbecovirus currently known to infect humans.  If clinically indicated additional testing with an alternate test  methodology [phone]) is advised. The SARS-CoV-2 RNA is generally  detectable in upper and lower respiratory sp ecimens during  the acute  phase of infection. The  expected result is Negative. Fact Sheet for Patients:  StrictlyIdeas.no Fact Sheet for Healthcare Providers: BankingDealers.co.za This test is not yet approved or cleared by the [place] FDA and has been authorized for detection and/or diagnosis of SARS-CoV-2 by FDA under an Emergency Use Authorization (EUA).  This EUA will remain in effect (meaning this test can be used) for the duration of the COVID-19 declaration under Section 564(b)(1) of the Act, 21 U.S.C. section 360bbb-3(b)(1), unless the authorization is terminated or revoked sooner. Performed at [facility], [place] [address] [place], [place] [address]          Radiology Studies: No results found.      Scheduled Meds: . aspirin  81 mg Oral Daily  . brimonidine  1 drop Right Eye BID  . chlorhexidine  15 mL Mouth Rinse BID  . enoxaparin (LOVENOX) injection  40 mg Subcutaneous Q24H  . insulin aspart  0-9 Units Subcutaneous TID AC & HS  . mouth rinse  15 mL Mouth Rinse q12n4p  . potassium & sodium phosphates  1 packet Oral TID WC & HS  . sodium chloride flush  10-40 mL Intracatheter Q12H  . sodium chloride flush  3 mL Intravenous Q12H  . timolol  1 drop Right Eye Daily   Continuous Infusions:   LOS: 8 days    Time spent: 25 minutes spent in the coordination of care.    Jonnie Finner, DO [facility] Pager [phone]  If 7PM-7AM, please contact night-coverage [email or web address] Password [facility] 06/01/2018, 3:40 PM

## 2018-06-01 NOTE — TOC Progression Note (Signed)
Transition of Care (TOC) - Progression Note    Patient Details  Name: Curtis Brock. MRN: [id number] Date of Birth: [birth year]  Transition of Care [facility]) CM/SW [place], [place] Phone Number: 06/01/2018, 12:57 PM  Clinical Narrative:     [facility] is now not able to take due to the patient having [employer]. CSW spoke with the patient's daughter Cherene Julian and she gave permission to fax the patient out to other facilities in [place] to find him a bed. CSW will call with bed offers.   Updated Covid came back negative. CSW will continue to assist with finding a bed for the patient.   Expected Discharge Plan: [place] Barriers to Discharge: Continued Medical Work up, Ship broker  Expected Discharge Plan and Services Expected Discharge Plan: [place] In-house Referral: Clinical Social Work Discharge Planning Services: NA Post Acute Care Choice: [place] Living arrangements for the past 2 months: Single Family Home                 DME Arranged: N/A DME Agency: NA       HH Arranged: NA HH Agency: NA         Social Determinants of Health (SDOH) Interventions    Readmission Risk Interventions No flowsheet data found.

## 2018-06-02 LAB — CBC
HCT: 31.6 % — ABNORMAL LOW (ref 39.0–52.0)
Hemoglobin: 10.4 g/dL — ABNORMAL LOW (ref 13.0–17.0)
MCH: 26.9 pg (ref 26.0–34.0)
MCHC: 32.9 g/dL (ref 30.0–36.0)
MCV: 81.7 fL (ref 80.0–100.0)
Platelets: 188 10*3/uL (ref 150–400)
RBC: 3.87 MIL/uL — ABNORMAL LOW (ref 4.22–5.81)
RDW: 14.2 % (ref 11.5–15.5)
WBC: 6.4 10*3/uL (ref 4.0–10.5)
nRBC: 0 % (ref 0.0–0.2)

## 2018-06-02 LAB — GLUCOSE, CAPILLARY
Glucose-Capillary: 126 mg/dL — ABNORMAL HIGH (ref 70–99)
Glucose-Capillary: 88 mg/dL (ref 70–99)
Glucose-Capillary: 175 mg/dL — ABNORMAL HIGH (ref 70–99)
Glucose-Capillary: 114 mg/dL — ABNORMAL HIGH (ref 70–99)
Glucose-Capillary: 107 mg/dL — ABNORMAL HIGH (ref 70–99)

## 2018-06-02 NOTE — Progress Notes (Signed)
Curtis Brock Kitchen  PROGRESS NOTE    Curtis Ly.  IFO:[id number] DOB: [birth year] DOA: 05/24/2018 PCP: [facility]   Brief Narrative:   85 year old with prior h/o of DM, left eye blindness, PVD, hypertension, presents with AMS, he was found to be in AKI, with SIRS, and with right upper chest shingles.   Assessment & Plan:   Principal Problem:   Sepsis ([place]) Active Problems:   Essential hypertension, benign   Altered mental status   Diabetes type 2, controlled ([place])   Sacral decubitus ulcer   Acute renal failure syndrome (HCC)   Shingles   1. Sepsis  - Unclear source. Lactate normalized.  - CXR does not show pneumonia.  - UCx negative - Blood cultures have been negative so far.  - Pt had diarrhea but GI pathogen PCR and c diff PCR is negative.  - was treated withIV vancomycin, cefepime and flagyl; but where stopped as there was no clear indication of what was being treated and a negative procal - from a sepsis standpoint, that has been treated is is most likely d/t his shingles -changedto valtrex  2. Essential Hypertension:  - stable off meds, monitor   3. AKI: - resolved  4. Stage 2 sacral decubitus ulcer:  - Wound care consulted and recommendations given.   5. Shingles:  - Vesicular lesions on the right upper chest on admission, which had decreased in size, slightly crusted. They need to be covered.  - Currently on IV acyclovir. - rash now dried and crusting over - as PO intake improves, transition to PO valtrex  6. Hypokalemia/hypophosphatemia - continueK-phos; this will likely need to be a daily supplement     - resolved  7. Mild anemia of chronic disease:  - Monitor hemoglobin.  8. Type 2 DM:  - Diet controlled.  - Resume SSI while inpatient.  - Dietary consulted for recommendations. - Dysphagia 1 diet   DVT prophylaxis: lovenox Code Status:  FULL   Disposition Plan: To SNF   Antimicrobials:  . valtrex    Subjective: Per nursing, no acute events ON  Objective: Vitals:   06/01/18 0800 06/01/18 1635 06/01/18 2331 06/02/18 0803  BP: (!) 142/83 (!) 122/46 112/62 125/72  Pulse: (!) 104 96 92 (!) 111  Resp:  13 16 13   Temp: 97.8 F (36.6 C) 98.6 F (37 C) 98.4 F (36.9 C) (!) 97.3 F (36.3 C)  TempSrc: Oral Oral Oral Oral  SpO2: 97% 96% 100% 99%  Weight:      Height:        Intake/Output Summary (Last 24 hours) at 06/02/2018 1427 Last data filed at 06/02/2018 0425 Gross per 24 hour  Intake -  Output 300 ml  Net -300 ml   Filed Weights   05/30/18 0500 05/31/18 0624 06/01/18 0404  Weight: 71.4 kg 73.6 kg 72.8 kg    Examination:  General exam:85 y.o.maleAppears a little agitated, yelling out, but says nothing is wrong Respiratory system: Clear to auscultation. Respiratory effort normal. Cardiovascular system:S1 &S2 heard, RRR. No JVD, murmurs, rubs, gallops or clicks. No pedal edema. Gastrointestinal system:Abdomen is nondistended, soft and nontender. No organomegaly or masses felt. Normal bowel sounds heard.. Skin:right shoulder/upper chest rash c/w shingles dry at this time Neuro:  Cooperative but yelling out, follows some commands, but is slow    Data Reviewed: I have personally reviewed following labs and imaging studies.  CBC: Recent Labs  Lab 05/28/18 0423 05/29/18 0500 05/31/18 0500 06/01/18 0630  06/02/18 0525  WBC 6.2 4.2 4.4 5.4 6.4  NEUTROABS 4.3  --  2.2 3.3  --   HGB 12.5* 11.9* 11.0* 10.6* 10.4*  HCT 36.3* 35.0* 32.4* 32.1* 31.6*  MCV 79.3* 79.2* 79.6* 80.0 81.7  PLT 142* 161 167 171 185   Basic Metabolic Panel: Recent Labs  Lab 05/28/18 0423  05/29/18 0500 05/29/18 0811 05/30/18 0500 05/31/18 0500 06/01/18 0630  NA 139   < > 137 138 139 142 142  K 2.8*   < > 5.6* 3.8 2.9* 3.3* 3.5  CL 111   < > 109 110 108 110 112*  CO2 19*   < > 21* 20* 22 23 25   GLUCOSE 114*   <  > 94 90 121* 77 105*  BUN 10   < > 11 8 13 15 17   CREATININE 0.71   < > 0.80 0.73 0.86 0.83 0.98  CALCIUM 8.5*   < > 8.1* 8.3* 8.7* 8.5* 8.2*  MG 1.8  --  2.0  --  1.9 1.8 1.8  PHOS 1.1*  --   --   --  1.8* 2.5 3.2   < > = values in this interval not displayed.   GFR: Estimated Creatinine Clearance: 45.2 mL/min (by C-G formula based on SCr of 0.98 mg/dL). Liver Function Tests: Recent Labs  Lab 05/28/18 0423 05/30/18 0500 05/31/18 0500 06/01/18 0630  ALBUMIN 2.1* 1.9* 1.9* 1.9*   No results for input(s): LIPASE, AMYLASE in the last 168 hours. No results for input(s): AMMONIA in the last 168 hours. Coagulation Profile: No results for input(s): INR, PROTIME in the last 168 hours. Cardiac Enzymes: No results for input(s): CKTOTAL, CKMB, CKMBINDEX, TROPONINI in the last 168 hours. BNP (last 3 results) No results for input(s): PROBNP in the last 8760 hours. HbA1C: No results for input(s): HGBA1C in the last 72 hours. CBG: Recent Labs  Lab 06/01/18 1935 06/01/18 2334 06/02/18 0343 06/02/18 0805 06/02/18 1122  GLUCAP 141* 152* 126* 88 175*   Lipid Profile: No results for input(s): CHOL, HDL, LDLCALC, TRIG, CHOLHDL, LDLDIRECT in the last 72 hours. Thyroid Function Tests: No results for input(s): TSH, T4TOTAL, FREET4, T3FREE, THYROIDAB in the last 72 hours. Anemia Panel: No results for input(s): VITAMINB12, FOLATE, FERRITIN, TIBC, IRON, RETICCTPCT in the last 72 hours. Sepsis Labs: Recent Labs  Lab 05/27/18 0435 05/28/18 0423  PROCALCITON 0.36 0.31    Recent Results (from the past 240 hour(s))  Culture, Urine     Status: None   Collection Time: 05/24/18  2:39 AM  Result Value Ref Range Status   Specimen Description URINE, RANDOM  Final   Special Requests NONE  Final   Culture   Final    NO GROWTH Performed at [facility], [address] [place], [place] [address]    Report Status 05/25/2018 FINAL  Final  SARS Coronavirus 2 (CEPHEID - Performed in [place] hospital lab), Hosp Order     Status: None   Collection Time: 05/24/18 12:31 PM  Result Value Ref Range Status   SARS Coronavirus 2 NEGATIVE NEGATIVE Final    Comment: (NOTE) If result is NEGATIVE SARS-CoV-2 target nucleic acids are NOT DETECTED. The SARS-CoV-2 RNA is generally detectable in upper and lower  respiratory specimens during the acute phase of infection. The lowest  concentration of SARS-CoV-2 viral copies this assay can detect is 250  copies / mL. A negative result does not preclude SARS-CoV-2 infection  and should not be used as  the sole basis for treatment or other  patient management decisions.  A negative result may occur with  improper specimen collection / handling, submission of specimen other  than nasopharyngeal swab, presence of viral mutation(s) within the  areas targeted by this assay, and inadequate number of viral copies  (<250 copies / mL). A negative result must be combined with clinical  observations, patient history, and epidemiological information. If result is POSITIVE SARS-CoV-2 target nucleic acids are DETECTED. The SARS-CoV-2 RNA is generally detectable in upper and lower  respiratory specimens dur ing the acute phase of infection.  Positive  results are indicative of active infection with SARS-CoV-2.  Clinical  correlation with patient history and other diagnostic information is  necessary to determine patient infection status.  Positive results do  not rule out bacterial infection or co-infection with other viruses. If result is PRESUMPTIVE POSTIVE SARS-CoV-2 nucleic acids MAY BE PRESENT.   A presumptive positive result was obtained on the submitted specimen  and confirmed on repeat testing.  While 2019 novel coronavirus  (SARS-CoV-2) nucleic acids may be present in the submitted sample  additional confirmatory testing may be necessary for epidemiological  and / or clinical management purposes  to differentiate between  SARS-CoV-2 and  other Sarbecovirus currently known to infect humans.  If clinically indicated additional testing with an alternate test  methodology [phone]) is advised. The SARS-CoV-2 RNA is generally  detectable in upper and lower respiratory sp ecimens during the acute  phase of infection. The expected result is Negative. Fact Sheet for Patients:  StrictlyIdeas.no Fact Sheet for Healthcare Providers: BankingDealers.co.za This test is not yet approved or cleared by the [place] FDA and has been authorized for detection and/or diagnosis of SARS-CoV-2 by FDA under an Emergency Use Authorization (EUA).  This EUA will remain in effect (meaning this test can be used) for the duration of the COVID-19 declaration under Section 564(b)(1) of the Act, 21 U.S.C. section 360bbb-3(b)(1), unless the authorization is terminated or revoked sooner. Performed at [facility], [place] [address], [place], [place] [address]   Blood Culture (routine x 2)     Status: None   Collection Time: 05/24/18  1:10 PM  Result Value Ref Range Status   Specimen Description BLOOD LEFT FOREARM  Final   Special Requests AEROBIC BOTTLE ONLY Blood Culture adequate volume  Final   Culture   Final    NO GROWTH 5 DAYS Performed at [facility], [place] [address] [place], [place] [address]    Report Status 05/29/2018 FINAL  Final  Blood Culture (routine x 2)     Status: None   Collection Time: 05/24/18  1:20 PM  Result Value Ref Range Status   Specimen Description BLOOD RIGHT WRIST  Final   Special Requests   Final    AEROBIC BOTTLE ONLY Blood Culture results may not be optimal due to an inadequate volume of blood received in culture bottles   Culture   Final    NO GROWTH 5 DAYS Performed at [facility], [place] [address] [place], [place] [address]    Report Status 05/29/2018 FINAL  Final  Gastrointestinal Panel by PCR , Stool     Status: None   Collection Time:  05/25/18  2:54 AM  Result Value Ref Range Status   Campylobacter species NOT DETECTED NOT DETECTED Final   Plesimonas shigelloides NOT DETECTED NOT DETECTED Final   Salmonella species NOT DETECTED NOT DETECTED Final   Yersinia enterocolitica  NOT DETECTED NOT DETECTED Final   Vibrio species NOT DETECTED NOT DETECTED Final   Vibrio cholerae NOT DETECTED NOT DETECTED Final   Enteroaggregative E coli (EAEC) NOT DETECTED NOT DETECTED Final   Enteropathogenic E coli (EPEC) NOT DETECTED NOT DETECTED Final   Enterotoxigenic E coli (ETEC) NOT DETECTED NOT DETECTED Final   Shiga like toxin producing E coli (STEC) NOT DETECTED NOT DETECTED Final   Shigella/Enteroinvasive E coli (EIEC) NOT DETECTED NOT DETECTED Final   Cryptosporidium NOT DETECTED NOT DETECTED Final   Cyclospora cayetanensis NOT DETECTED NOT DETECTED Final   Entamoeba histolytica NOT DETECTED NOT DETECTED Final   Giardia lamblia NOT DETECTED NOT DETECTED Final   Adenovirus F40/41 NOT DETECTED NOT DETECTED Final   Astrovirus NOT DETECTED NOT DETECTED Final   Norovirus GI/GII NOT DETECTED NOT DETECTED Final   Rotavirus A NOT DETECTED NOT DETECTED Final   Sapovirus (I, II, IV, and V) NOT DETECTED NOT DETECTED Final    Comment: Performed at [facility], [address] [place], [place] [address]  C difficile quick scan w PCR reflex     Status: None   Collection Time: 05/25/18  2:54 AM  Result Value Ref Range Status   C Diff antigen NEGATIVE NEGATIVE Final   C Diff toxin NEGATIVE NEGATIVE Final   C Diff interpretation No C. difficile detected.  Final    Comment: Performed at [facility], [place] [address] [place], [place] [address]  SARS Coronavirus 2 (CEPHEID - Performed in [place] hospital lab), Hosp Order     Status: None   Collection Time: 06/01/18 11:18 AM  Result Value Ref Range Status   SARS Coronavirus 2 NEGATIVE NEGATIVE Final    Comment: (NOTE) If result is NEGATIVE SARS-CoV-2 target nucleic acids are  NOT DETECTED. The SARS-CoV-2 RNA is generally detectable in upper and lower  respiratory specimens during the acute phase of infection. The lowest  concentration of SARS-CoV-2 viral copies this assay can detect is 250  copies / mL. A negative result does not preclude SARS-CoV-2 infection  and should not be used as the sole basis for treatment or other  patient management decisions.  A negative result may occur with  improper specimen collection / handling, submission of specimen other  than nasopharyngeal swab, presence of viral mutation(s) within the  areas targeted by this assay, and inadequate number of viral copies  (<250 copies / mL). A negative result must be combined with clinical  observations, patient history, and epidemiological information. If result is POSITIVE SARS-CoV-2 target nucleic acids are DETECTED. The SARS-CoV-2 RNA is generally detectable in upper and lower  respiratory specimens dur ing the acute phase of infection.  Positive  results are indicative of active infection with SARS-CoV-2.  Clinical  correlation with patient history and other diagnostic information is  necessary to determine patient infection status.  Positive results do  not rule out bacterial infection or co-infection with other viruses. If result is PRESUMPTIVE POSTIVE SARS-CoV-2 nucleic acids MAY BE PRESENT.   A presumptive positive result was obtained on the submitted specimen  and confirmed on repeat testing.  While 2019 novel coronavirus  (SARS-CoV-2) nucleic acids may be present in the submitted sample  additional confirmatory testing may be necessary for epidemiological  and / or clinical management purposes  to differentiate between  SARS-CoV-2 and other Sarbecovirus currently known to infect humans.  If clinically indicated additional testing with an alternate test  methodology [phone]) is advised. The SARS-CoV-2 RNA  is generally  detectable in upper and lower respiratory sp ecimens  during the acute  phase of infection. The expected result is Negative. Fact Sheet for Patients:  StrictlyIdeas.no Fact Sheet for Healthcare Providers: BankingDealers.co.za This test is not yet approved or cleared by the [place] FDA and has been authorized for detection and/or diagnosis of SARS-CoV-2 by FDA under an Emergency Use Authorization (EUA).  This EUA will remain in effect (meaning this test can be used) for the duration of the COVID-19 declaration under Section 564(b)(1) of the Act, 21 U.S.C. section 360bbb-3(b)(1), unless the authorization is terminated or revoked sooner. Performed at [facility], [place] [address] [place], [place] [address]          Radiology Studies: No results found.      Scheduled Meds: . aspirin  81 mg Oral Daily  . brimonidine  1 drop Right Eye BID  . chlorhexidine  15 mL Mouth Rinse BID  . enoxaparin (LOVENOX) injection  40 mg Subcutaneous Q24H  . insulin aspart  0-9 Units Subcutaneous TID AC & HS  . mouth rinse  15 mL Mouth Rinse q12n4p  . potassium & sodium phosphates  1 packet Oral TID WC & HS  . sodium chloride flush  10-40 mL Intracatheter Q12H  . sodium chloride flush  3 mL Intravenous Q12H  . timolol  1 drop Right Eye Daily   Continuous Infusions:   LOS: 9 days    Time spent: 25 minutes spent in the coordination of care today    Jonnie Finner, DO [facility] Pager [phone]  If 7PM-7AM, please contact night-coverage [email or web address] Password [facility] 06/02/2018, 2:27 PM

## 2018-06-02 NOTE — Progress Notes (Signed)
Physical Therapy Treatment Patient Details Name: Curtis Brock. MRN: [id number] DOB: [birth year] Today's Date: 06/02/2018    History of Present Illness 85 y.o. male admitted on 05/24/18 for AMS, hypothermia.  Pt dx with sepsis of unclear etiology (urine cultures pending, CXR does not show PNA), diarrhea (c diff negative), stage 2 sacral decubitus, hypokalemia, rash on upper chest wall suspicious for shingles.  Pt with significant PMH of L eye blindness, prior DM, PVD, L great toe ulceration s/p baloon angioplasty.      PT Comments    Pt making slow progress. Continue to recommend ST-SNF.   Follow Up Recommendations  SNF     Equipment Recommendations  Hospital bed;Other (comment)(air mattress overlay, ambulance transport if home, hoyer )    Recommendations for Other Services       Precautions / Restrictions Precautions Precautions: Fall Restrictions Weight Bearing Restrictions: No    Mobility  Bed Mobility Overal bed mobility: Needs Assistance Bed Mobility: Supine to Sit;Sit to Supine     Supine to sit: +2 for physical assistance;Max assist Sit to supine: +2 for physical assistance;Max assist   General bed mobility comments: Assist for all aspects  Transfers Overall transfer level: Needs assistance Equipment used: Rolling walker (2 wheeled) Transfers: Sit to/from Stand Sit to Stand: +2 physical assistance;Max assist         General transfer comment: Assist to bring hips up and for balance. Pt partially stood but unable to stand fully erect.   Ambulation/Gait                 Stairs             Wheelchair Mobility    Modified Rankin (Stroke Patients Only)       Balance Overall balance assessment: Needs assistance Sitting-balance support: Feet supported;Bilateral upper extremity supported Sitting balance-Leahy Scale: Poor Sitting balance - Comments: Sat EOB x 10 minutes with min guard assist                                     Cognition Arousal/Alertness: Awake/alert Behavior During Therapy: Flat affect Overall Cognitive Status: No family/caregiver present to determine baseline cognitive functioning                                 General Comments: Pt responding to some questions with answers like "yea" or "okay". Pt following some commands.       Exercises      General Comments        Pertinent Vitals/Pain Pain Assessment: Faces Faces Pain Scale: No hurt    Home Living                      Prior Function            PT Goals (current goals can now be found in the care plan section) Progress towards PT goals: Progressing toward goals    Frequency    Min 2X/week      PT Plan Current plan remains appropriate    Co-evaluation              AM-PAC PT "6 Clicks" Mobility   Outcome Measure  Help needed turning from your back to your side while in a flat bed without using bedrails?: Total Help needed moving from lying on your back to sitting  on the side of a flat bed without using bedrails?: Total Help needed moving to and from a bed to a chair (including a wheelchair)?: Total Help needed standing up from a chair using your arms (e.g., wheelchair or bedside chair)?: Total Help needed to walk in hospital room?: Total Help needed climbing 3-5 steps with a railing? : Total 6 Click Score: 6    End of Session   Activity Tolerance: Patient tolerated treatment well Patient left: in bed;with call bell/phone within reach;with bed alarm set Nurse Communication: Mobility status PT Visit Diagnosis: Muscle weakness (generalized) (M62.81);Difficulty in walking, not elsewhere classified (R26.2)     Time: 0569-7948 PT Time Calculation (min) (ACUTE ONLY): 17 min  Charges:  $Therapeutic Activity: 8-22 mins                     [place] Pager [phone] Office [place] 06/02/2018, 12:41 PM

## 2018-06-03 LAB — CBC
HCT: 31.8 % — ABNORMAL LOW (ref 39.0–52.0)
Hemoglobin: 10.2 g/dL — ABNORMAL LOW (ref 13.0–17.0)
MCH: 26.7 pg (ref 26.0–34.0)
MCHC: 32.1 g/dL (ref 30.0–36.0)
MCV: 83.2 fL (ref 80.0–100.0)
Platelets: 187 10*3/uL (ref 150–400)
RBC: 3.82 MIL/uL — ABNORMAL LOW (ref 4.22–5.81)
RDW: 14.6 % (ref 11.5–15.5)
WBC: 6.5 10*3/uL (ref 4.0–10.5)
nRBC: 0 % (ref 0.0–0.2)

## 2018-06-03 LAB — GLUCOSE, CAPILLARY
Glucose-Capillary: 80 mg/dL (ref 70–99)
Glucose-Capillary: 97 mg/dL (ref 70–99)
Glucose-Capillary: 92 mg/dL (ref 70–99)
Glucose-Capillary: 101 mg/dL — ABNORMAL HIGH (ref 70–99)

## 2018-06-03 MED ORDER — RESOURCE THICKENUP CLEAR PO POWD
ORAL | Status: DC | PRN
  Filled 2018-06-03: qty 125

## 2018-06-03 MED ORDER — BISACODYL 10 MG RE SUPP
10.0000 mg | Freq: Once | RECTAL | Status: AC
  Administered 2018-06-03: 10 mg via RECTAL
  Filled 2018-06-03: qty 1

## 2018-06-03 NOTE — Care Management Important Message (Signed)
Important Message  Patient Details  Name: Curtis Brock. MRN: [id number] Date of Birth: Jan 19, 1925   Medicare Important Message Given:  Yes    Yolander Goodie 06/03/2018, 12:21 PM

## 2018-06-03 NOTE — Progress Notes (Signed)
Occupational Therapy Treatment Patient Details Name: Curtis Brock. MRN: [id number] DOB: 02-24-1925 Today's Date: 06/03/2018    History of present illness 85 y.o. male admitted on 05/24/18 for AMS, hypothermia.  Pt dx with sepsis of unclear etiology (urine cultures pending, CXR does not show PNA), diarrhea (c diff negative), stage 2 sacral decubitus, hypokalemia, rash on upper chest wall suspicious for shingles.  Pt with significant PMH of L eye blindness, prior DM, PVD, L great toe ulceration s/p baloon angioplasty.     OT comments  Pt sat EOB x12 mins with minguardA to reduce leaning to L side. Pt sat with UE supports with RW in front of him to support self. Pt unable to wash face nor feed self without maxA. Pt tolerated session well- smiling and more interactive today. OT to follow acutely. SNF recommendation as pt was ambulating PTA.    Follow Up Recommendations  SNF;Supervision/Assistance - 24 hour    Equipment Recommendations  Other (comment)(to be determined)    Recommendations for Other Services      Precautions / Restrictions Precautions Precautions: Fall Restrictions Weight Bearing Restrictions: No       Mobility Bed Mobility Overal bed mobility: Needs Assistance Bed Mobility: Supine to Sit;Sit to Supine     Supine to sit: Total assist Sit to supine: Total assist   General bed mobility comments: Assist for all aspects  Transfers Overall transfer level: Needs assistance Equipment used: Rolling walker (2 wheeled) Transfers: Sit to/from Stand Sit to Stand: Total assist         General transfer comment: assist for hips to EOB. pt unable to stand withoutdependence with resistance from pt.    Balance Overall balance assessment: Needs assistance Sitting-balance support: Feet supported;Bilateral upper extremity supported Sitting balance-Leahy Scale: Poor Sitting balance - Comments: Sat EOB x 12 minutes with min guard assist       Standing balance comment:  Attempting to stand, but pt unable to attempt without dependence- and resistance felt when attempted.                            ADL either performed or assessed with clinical judgement   ADL Overall ADL's : Needs assistance/impaired Eating/Feeding: Maximal assistance;Sitting Eating/Feeding Details (indicate cue type and reason): Pt unable to perform hand to mouth motion without maxA. Pt's daughter on phone stated that pt was fed by family. Grooming: Total assistance                               Functional mobility during ADLs: Total assistance General ADL Comments: Pt attempting to wash face, but unable to make motion to face without maxA.  pt sitting EOB x12 mins today with intermittent assist for sitting upright as pt leans to R. Pt answering name and birthday and "hospital" for place.      Vision   Vision Assessment?: Vision impaired- to be further tested in functional context   Perception     Praxis      Cognition Arousal/Alertness: Lethargic Behavior During Therapy: Flat affect Overall Cognitive Status: No family/caregiver present to determine baseline cognitive functioning                                 General Comments: Pt responding to some questions with answers like "yea" or "okay". Pt following some commands.  Exercises     Shoulder Instructions       General Comments      Pertinent Vitals/ Pain       Pain Assessment: Faces Faces Pain Scale: No hurt  Home Living                                          Prior Functioning/Environment              Frequency  Min 2X/week        Progress Toward Goals  OT Goals(current goals can now be found in the care plan section)  Progress towards OT goals: Progressing toward goals(slowly)  Acute Rehab OT Goals Patient Stated Goal: unable to state OT Goal Formulation: With patient Time For Goal Achievement: 06/10/18 Potential to Achieve  Goals: Fair ADL Goals Pt Will Perform Grooming: with max assist;sitting Pt Will Perform Upper Body Dressing: with max assist;sitting Pt/caregiver will Perform Home Exercise Program: Increased ROM;Increased strength;Both right and left upper extremity Additional ADL Goal #1: Pt will tolerate EOB x10 mins for ADL task wth fair balance Additional ADL Goal #2: Pt will perform ADL functional transfer with [place] Discharge plan remains appropriate    Co-evaluation                 AM-PAC OT "6 Clicks" Daily Activity     Outcome Measure   Help from another person eating meals?: Total Help from another person taking care of personal grooming?: Total Help from another person toileting, which includes using toliet, bedpan, or urinal?: Total Help from another person bathing (including washing, rinsing, drying)?: Total Help from another person to put on and taking off regular upper body clothing?: Total Help from another person to put on and taking off regular lower body clothing?: Total 6 Click Score: 6    End of Session Equipment Utilized During Treatment: Gait belt;Rolling walker  OT Visit Diagnosis: Unsteadiness on feet (R26.81);Muscle weakness (generalized) (M62.81);Other symptoms and signs involving cognitive function   Activity Tolerance Patient limited by lethargy;Patient limited by fatigue;Treatment limited secondary to medical complications (Comment)   Patient Left in bed;with call bell/phone within reach;with bed alarm set   Nurse Communication Mobility status        Time: 9937-1696 OT Time Calculation (min): 22 min  Charges: OT General Charges $OT Visit: 1 Visit OT Treatments $Self Care/Home Management : 8-22 mins  Darryl Nestle) Marsa Aris OTR/L Acute Rehabilitation Services Pager: [phone] Office: [place] 06/03/2018, 10:30 AM

## 2018-06-03 NOTE — Progress Notes (Signed)
  Speech Language Pathology Treatment: Dysphagia  Patient Details Name: Curtis Brock. MRN: [id number] DOB: Sep 19, 1925 Today's Date: 06/03/2018 Time: 2703-5009 SLP Time Calculation (min) (ACUTE ONLY): 11 min  Assessment / Plan / Recommendation Clinical Impression  Pt assisted with several bites of puree, nectar thick liquids after repositioned with HOB elevated as high as possible.  Pt's swallowing function is stable. He continues to require assistance with feeding; appropriate time to manipulate solids and swallow without being rushed.  Nectar thick liquids pose less risk of overt aspiration, although given pt's hx of dysphagia and reliance on others for self-feeding and oral care, he will continue to be at risk for developing aspiration pna.  Given stable function on current diet, SLP services will sign off at this time.  Continue dysphagia 1, nectar liquids, and careful hand-feeding at time of D/C to SNF.   HPI HPI: Patient is a 85 year old male with PMH:  DM, left eye blindness, PVD, hypertension, who presented to hospital with AMS, he was found to be in AKI, with SIRS, and with right upper chest shingles. Patient has h/o dysphagia with most recent MBS in 2018 and recommending Dys 2/3, thin liquids.      SLP Plan  Discharge SLP treatment due to (comment)       Recommendations  Diet recommendations: Dysphagia 1 (puree);Nectar-thick liquid Liquids provided via: Straw Medication Administration: Crushed with puree Supervision: Staff to assist with self feeding;Full supervision/cueing for compensatory strategies Compensations: Minimize environmental distractions;Slow rate;Small sips/bites Postural Changes and/or Swallow Maneuvers: Seated upright 90 degrees                Plan: Discharge SLP treatment due to (comment)       GO                Juan Quam Laurice 06/03/2018, 3:31 PM  Jadyn Barge L. Tivis Ringer, [place] Office number  [phone] Pager [phone]

## 2018-06-03 NOTE — TOC Progression Note (Signed)
Transition of Care (TOC) - Progression Note    Patient Details  Name: Rakin Lemelle. MRN: [id number] Date of Birth: 1925-11-02  Transition of Care [facility]) CM/SW Contact  Eileen Stanford, LCSW Phone Number: 06/03/2018, 3:27 PM  Clinical Narrative:   [facility] requiring updated COVID test. MD aware. Holland Falling Josem Kaufmann has been started.    Expected Discharge Plan: [place] Barriers to Discharge: Continued Medical Work up, Ship broker  Expected Discharge Plan and Services Expected Discharge Plan: [place] In-house Referral: Clinical Social Work Discharge Planning Services: NA Post Acute Care Choice: [place] Living arrangements for the past 2 months: Single Family Home                 DME Arranged: N/A DME Agency: NA       HH Arranged: NA HH Agency: NA         Social Determinants of Health (SDOH) Interventions    Readmission Risk Interventions No flowsheet data found.

## 2018-06-03 NOTE — Progress Notes (Signed)
Labs drawn from RUE Midline without difficulty and sent to lab. Specimen was a CBC. Line flushed afterwards

## 2018-06-03 NOTE — Progress Notes (Signed)
[name]  QIO:[id number] DOB: Mar 13, 1925 DOA: 05/24/2018 PCP: [facility]   Brief Narrative:   85 year old with prior h/o of DM, left eye blindness, PVD, hypertension, presents with AMS, he was found to be in AKI, with SIRS, and with right upper chest shingles.   Assessment & Plan: 1. Sepsis  - Unclear source. Lactate normalized.  - CXR does not show pneumonia.  - UCx negative - Blood cultures have been negative so far.  - Pt had diarrhea but GI pathogen PCR and c diff PCR is negative.  - was treated withIV vancomycin, cefepime and flagyl; but where stopped as there was no clear indication of what was being treated and a negative procal - from a sepsis standpoint, that has been treated is is most likely d/t his shingles -completed valtrex  2. Essential Hypertension:  - stable off meds, monitor   3. AKI: - resolved  4. Stage 2 sacral decubitus ulcer:  - Wound care consulted and recommendations given.   5. Shingles:  - Vesicular lesions on the right upper chest on admission, which had decreased in size, slightly crusted. They need to be covered.  - Currently on IV acyclovir. - rash now dried and crusting over - as PO intake improves, transition to PO valtrex  6. Hypokalemia/hypophosphatemia - continueK-phos; this will likely need to be a daily supplement     - resolved  7. Mild anemia of chronic disease:  - Monitor hemoglobin.  8. Type 2 DM:  - Diet controlled.  - Resume SSI while inpatient.  - Dietary consulted for recommendations. - Dysphagia 1 diet   DVT prophylaxis: lovenox Code Status: FULL   Disposition Plan: To SNF   Antimicrobials:  . valtrex    Subjective: No acute complains, no fever or chills, no acute events overnight  Objective: Vitals:   06/02/18 1552 06/02/18 2040 06/03/18 0058 06/03/18 0745  BP:  127/70 114/83 112/78 116/75  Pulse: 97 89 86 93  Resp: 13 16 18    Temp: 97.9 F (36.6 C) 98.1 F (36.7 C) 98.4 F (36.9 C) 98.2 F (36.8 C)  TempSrc: Oral Oral Oral Oral  SpO2: 100% 100% 100% 100%  Weight:      Height:        Intake/Output Summary (Last 24 hours) at 06/03/2018 1805 Last data filed at 06/02/2018 2149 Gross per 24 hour  Intake -  Output 750 ml  Net -750 ml   Filed Weights   05/30/18 0500 05/31/18 0624 06/01/18 0404  Weight: 71.4 kg 73.6 kg 72.8 kg    Examination:  General exam:85 y.o.maleAppears a little agitated, yelling out, but says nothing is wrong Respiratory system: Clear to auscultation. Respiratory effort normal. Cardiovascular system:S1 &S2 heard, RRR. No JVD, murmurs, rubs, gallops or clicks. No pedal edema. Gastrointestinal system:Abdomen is nondistended, soft and nontender. No organomegaly or masses felt. Normal bowel sounds heard.. Skin:right shoulder/upper chest rash c/w shingles dry at this time Neuro:  Cooperative but yelling out, follows some commands, but is slow    Data Reviewed: I have personally reviewed following labs and imaging studies.  CBC: Recent Labs  Lab 05/28/18 0423 05/29/18 0500 05/31/18 0500 06/01/18 0630 06/02/18 0525 06/03/18 0428  WBC 6.2 4.2 4.4 5.4 6.4 6.5  NEUTROABS 4.3  --  2.2 3.3  --   --   HGB 12.5* 11.9* 11.0* 10.6* 10.4* 10.2*  HCT 36.3* 35.0* 32.4* 32.1* 31.6* 31.8*  MCV  79.3* 79.2* 79.6* 80.0 81.7 83.2  PLT 142* 161 167 171 188 413   Basic Metabolic Panel: Recent Labs  Lab 05/28/18 0423  05/29/18 0500 05/29/18 0811 05/30/18 0500 05/31/18 0500 06/01/18 0630  NA 139   < > 137 138 139 142 142  K 2.8*   < > 5.6* 3.8 2.9* 3.3* 3.5  CL 111   < > 109 110 108 110 112*  CO2 19*   < > 21* 20* 22 23 25   GLUCOSE 114*   < > 94 90 121* 77 105*  BUN 10   < > 11 8 13 15 17   CREATININE 0.71   < > 0.80 0.73 0.86 0.83 0.98  CALCIUM 8.5*   < > 8.1* 8.3* 8.7* 8.5* 8.2*  MG 1.8  --  2.0  --  1.9 1.8  1.8  PHOS 1.1*  --   --   --  1.8* 2.5 3.2   < > = values in this interval not displayed.   GFR: Estimated Creatinine Clearance: 45.2 mL/min (by C-G formula based on SCr of 0.98 mg/dL). Liver Function Tests: Recent Labs  Lab 05/28/18 0423 05/30/18 0500 05/31/18 0500 06/01/18 0630  ALBUMIN 2.1* 1.9* 1.9* 1.9*   No results for input(s): LIPASE, AMYLASE in the last 168 hours. No results for input(s): AMMONIA in the last 168 hours. Coagulation Profile: No results for input(s): INR, PROTIME in the last 168 hours. Cardiac Enzymes: No results for input(s): CKTOTAL, CKMB, CKMBINDEX, TROPONINI in the last 168 hours. BNP (last 3 results) No results for input(s): PROBNP in the last 8760 hours. HbA1C: No results for input(s): HGBA1C in the last 72 hours. CBG: Recent Labs  Lab 06/02/18 1549 06/02/18 2106 06/03/18 0746 06/03/18 1106 06/03/18 1712  GLUCAP 114* 107* 80 97 92   Lipid Profile: No results for input(s): CHOL, HDL, LDLCALC, TRIG, CHOLHDL, LDLDIRECT in the last 72 hours. Thyroid Function Tests: No results for input(s): TSH, T4TOTAL, FREET4, T3FREE, THYROIDAB in the last 72 hours. Anemia Panel: No results for input(s): VITAMINB12, FOLATE, FERRITIN, TIBC, IRON, RETICCTPCT in the last 72 hours. Sepsis Labs: Recent Labs  Lab 05/28/18 0423  PROCALCITON 0.31    Recent Results (from the past 240 hour(s))  Gastrointestinal Panel by PCR , Stool     Status: None   Collection Time: 05/25/18  2:54 AM  Result Value Ref Range Status   Campylobacter species NOT DETECTED NOT DETECTED Final   Plesimonas shigelloides NOT DETECTED NOT DETECTED Final   Salmonella species NOT DETECTED NOT DETECTED Final   Yersinia enterocolitica NOT DETECTED NOT DETECTED Final   Vibrio species NOT DETECTED NOT DETECTED Final   Vibrio cholerae NOT DETECTED NOT DETECTED Final   Enteroaggregative E coli (EAEC) NOT DETECTED NOT DETECTED Final   Enteropathogenic E coli (EPEC) NOT DETECTED NOT DETECTED  Final   Enterotoxigenic E coli (ETEC) NOT DETECTED NOT DETECTED Final   Shiga like toxin producing E coli (STEC) NOT DETECTED NOT DETECTED Final   Shigella/Enteroinvasive E coli (EIEC) NOT DETECTED NOT DETECTED Final   Cryptosporidium NOT DETECTED NOT DETECTED Final   Cyclospora cayetanensis NOT DETECTED NOT DETECTED Final   Entamoeba histolytica NOT DETECTED NOT DETECTED Final   Giardia lamblia NOT DETECTED NOT DETECTED Final   Adenovirus F40/41 NOT DETECTED NOT DETECTED Final   Astrovirus NOT DETECTED NOT DETECTED Final   Norovirus GI/GII NOT DETECTED NOT DETECTED Final   Rotavirus A NOT DETECTED NOT DETECTED Final   Sapovirus (I, II, IV, and V)  NOT DETECTED NOT DETECTED Final    Comment: Performed at [facility], [address] [place], [place] [address]  C difficile quick scan w PCR reflex     Status: None   Collection Time: 05/25/18  2:54 AM  Result Value Ref Range Status   C Diff antigen NEGATIVE NEGATIVE Final   C Diff toxin NEGATIVE NEGATIVE Final   C Diff interpretation No C. difficile detected.  Final    Comment: Performed at [facility], [place] [address] [place], [place] [address]  SARS Coronavirus 2 (CEPHEID - Performed in [place] hospital lab), Hosp Order     Status: None   Collection Time: 06/01/18 11:18 AM  Result Value Ref Range Status   SARS Coronavirus 2 NEGATIVE NEGATIVE Final    Comment: (NOTE) If result is NEGATIVE SARS-CoV-2 target nucleic acids are NOT DETECTED. The SARS-CoV-2 RNA is generally detectable in upper and lower  respiratory specimens during the acute phase of infection. The lowest  concentration of SARS-CoV-2 viral copies this assay can detect is 250  copies / mL. A negative result does not preclude SARS-CoV-2 infection  and should not be used as the sole basis for treatment or other  patient management decisions.  A negative result may occur with  improper specimen collection / handling, submission of specimen other  than  nasopharyngeal swab, presence of viral mutation(s) within the  areas targeted by this assay, and inadequate number of viral copies  (<250 copies / mL). A negative result must be combined with clinical  observations, patient history, and epidemiological information. If result is POSITIVE SARS-CoV-2 target nucleic acids are DETECTED. The SARS-CoV-2 RNA is generally detectable in upper and lower  respiratory specimens dur ing the acute phase of infection.  Positive  results are indicative of active infection with SARS-CoV-2.  Clinical  correlation with patient history and other diagnostic information is  necessary to determine patient infection status.  Positive results do  not rule out bacterial infection or co-infection with other viruses. If result is PRESUMPTIVE POSTIVE SARS-CoV-2 nucleic acids MAY BE PRESENT.   A presumptive positive result was obtained on the submitted specimen  and confirmed on repeat testing.  While 2019 novel coronavirus  (SARS-CoV-2) nucleic acids may be present in the submitted sample  additional confirmatory testing may be necessary for epidemiological  and / or clinical management purposes  to differentiate between  SARS-CoV-2 and other Sarbecovirus currently known to infect humans.  If clinically indicated additional testing with an alternate test  methodology [phone]) is advised. The SARS-CoV-2 RNA is generally  detectable in upper and lower respiratory sp ecimens during the acute  phase of infection. The expected result is Negative. Fact Sheet for Patients:  StrictlyIdeas.no Fact Sheet for Healthcare Providers: BankingDealers.co.za This test is not yet approved or cleared by the [place] FDA and has been authorized for detection and/or diagnosis of SARS-CoV-2 by FDA under an Emergency Use Authorization (EUA).  This EUA will remain in effect (meaning this test can be used) for the duration of the  COVID-19 declaration under Section 564(b)(1) of the Act, 21 U.S.C. section 360bbb-3(b)(1), unless the authorization is terminated or revoked sooner. Performed at [facility], [place] [address] [place], [place] [address]          Radiology Studies: No results found.      Scheduled Meds: . aspirin  81 mg Oral Daily  . brimonidine  1 drop Right Eye BID  . chlorhexidine  15 mL Mouth Rinse BID  . enoxaparin (LOVENOX) injection  40 mg Subcutaneous Q24H  . insulin aspart  0-9 Units Subcutaneous TID AC & HS  . mouth rinse  15 mL Mouth Rinse q12n4p  . potassium & sodium phosphates  1 packet Oral TID WC & HS  . sodium chloride flush  10-40 mL Intracatheter Q12H  . sodium chloride flush  3 mL Intravenous Q12H  . timolol  1 drop Right Eye Daily   Continuous Infusions:   LOS: 10 days    Time spent: 25 minutes spent in the coordination of care today  Curtis Brock   If 7PM-7AM, please contact night-coverage [email or web address] Password [facility] 06/03/2018, 6:05 PM

## 2018-06-04 LAB — CBC
HCT: 33.3 % — ABNORMAL LOW (ref 39.0–52.0)
Hemoglobin: 10.5 g/dL — ABNORMAL LOW (ref 13.0–17.0)
MCH: 26.8 pg (ref 26.0–34.0)
MCHC: 31.5 g/dL (ref 30.0–36.0)
MCV: 84.9 fL (ref 80.0–100.0)
Platelets: 222 10*3/uL (ref 150–400)
RBC: 3.92 MIL/uL — ABNORMAL LOW (ref 4.22–5.81)
RDW: 15.3 % (ref 11.5–15.5)
WBC: 6.2 10*3/uL (ref 4.0–10.5)
nRBC: 0 % (ref 0.0–0.2)

## 2018-06-04 LAB — GLUCOSE, CAPILLARY
Glucose-Capillary: 84 mg/dL (ref 70–99)
Glucose-Capillary: 116 mg/dL — ABNORMAL HIGH (ref 70–99)
Glucose-Capillary: 117 mg/dL — ABNORMAL HIGH (ref 70–99)
Glucose-Capillary: 180 mg/dL — ABNORMAL HIGH (ref 70–99)

## 2018-06-04 LAB — NOVEL CORONAVIRUS, NAA (HOSP ORDER, SEND-OUT TO REF LAB; TAT 18-24 HRS): SARS-CoV-2, NAA: NOT DETECTED

## 2018-06-04 MED ORDER — FUROSEMIDE 10 MG/ML IJ SOLN
20.0000 mg | Freq: Once | INTRAMUSCULAR | Status: AC
  Administered 2018-06-04: 20 mg via INTRAVENOUS
  Filled 2018-06-04: qty 2

## 2018-06-04 NOTE — Progress Notes (Signed)
[name]  UXN:[id number] DOB: February 19, 1925 DOA: 05/24/2018 PCP: [facility]   Brief Narrative:   85 year old with prior h/o of DM, left eye blindness, PVD, hypertension, presents with AMS, he was found to be in AKI, with SIRS, and with right upper chest shingles.   Assessment & Plan: 1. Sepsis  - Unclear source. Lactate normalized.  - CXR does not show pneumonia.  - UCx negative - Blood cultures have been negative so far.  - Pt had diarrhea but GI pathogen PCR and c diff PCR is negative.  - was treated withIV vancomycin, cefepime and flagyl; but where stopped as there was no clear indication of what was being treated and a negative procal - from a sepsis standpoint, that has been treated is is most likely d/t his shingles -completed valtrex  2. Essential Hypertension:  - stable off meds, monitor   3. AKI: - resolved  4. Stage 2 sacral decubitus ulcer:  - Wound care consulted and recommendations given.   5. Shingles:  - Vesicular lesions on the right upper chest on admission, which had decreased in size, slightly crusted. They need to be covered.  - Currently on IV acyclovir. - rash now dried and crusting over - as PO intake improves, transition to PO valtrex  6. Hypokalemia/hypophosphatemia - continueK-phos; this will likely need to be a daily supplement     - resolved  7. Mild anemia of chronic disease:  - Monitor hemoglobin.  8. Type 2 DM:  - Diet controlled.  - Resume SSI while inpatient.  - Dietary consulted for recommendations. - Dysphagia 1 diet   DVT prophylaxis: lovenox Code Status: FULL   Disposition Plan: To SNF   Antimicrobials:  . valtrex    Subjective: No acute complains,   Objective: Vitals:   06/03/18 2008 06/04/18 0003 06/04/18 0812 06/04/18 1625  BP: (!) 142/79 116/68 (!) 127/104 (!) 134/92   Pulse: 93 96 97 90  Resp: 20 16    Temp: 98.5 F (36.9 C) 98 F (36.7 C) 97.9 F (36.6 C) 98.6 F (37 C)  TempSrc: Oral   Oral  SpO2: 100% 100% 100% 100%  Weight:      Height:        Intake/Output Summary (Last 24 hours) at 06/04/2018 1912 Last data filed at 06/04/2018 1742 Gross per 24 hour  Intake 236 ml  Output 603 ml  Net -367 ml   Filed Weights   05/30/18 0500 05/31/18 0624 06/01/18 0404  Weight: 71.4 kg 73.6 kg 72.8 kg    Examination:  General exam:85 y.o.maleAppears a little agitated, yelling out, but says nothing is wrong Respiratory system: Clear to auscultation. Respiratory effort normal. Cardiovascular system:S1 &S2 heard, RRR. No JVD, murmurs, rubs, gallops or clicks. No pedal edema. Gastrointestinal system:Abdomen is nondistended, soft and nontender. No organomegaly or masses felt. Normal bowel sounds heard.. Skin:right shoulder/upper chest rash c/w shingles dry at this time Neuro:  Cooperative but yelling out, follows some commands, but is slow    Data Reviewed: I have personally reviewed following labs and imaging studies.  CBC: Recent Labs  Lab 05/31/18 0500 06/01/18 0630 06/02/18 0525 06/03/18 0428 06/04/18 0612  WBC 4.4 5.4 6.4 6.5 6.2  NEUTROABS 2.2 3.3  --   --   --   HGB 11.0* 10.6* 10.4* 10.2* 10.5*  HCT 32.4* 32.1* 31.6* 31.8* 33.3*  MCV 79.6* 80.0 81.7 83.2 84.9  PLT 167 171  188 187 481   Basic Metabolic Panel: Recent Labs  Lab 05/29/18 0500 05/29/18 0811 05/30/18 0500 05/31/18 0500 06/01/18 0630  NA 137 138 139 142 142  K 5.6* 3.8 2.9* 3.3* 3.5  CL 109 110 108 110 112*  CO2 21* 20* 22 23 25   GLUCOSE 94 90 121* 77 105*  BUN 11 8 13 15 17   CREATININE 0.80 0.73 0.86 0.83 0.98  CALCIUM 8.1* 8.3* 8.7* 8.5* 8.2*  MG 2.0  --  1.9 1.8 1.8  PHOS  --   --  1.8* 2.5 3.2   GFR: Estimated Creatinine Clearance: 45.2 mL/min (by C-G formula based on SCr of 0.98 mg/dL). Liver Function Tests: Recent Labs  Lab 05/30/18 0500  05/31/18 0500 06/01/18 0630  ALBUMIN 1.9* 1.9* 1.9*   No results for input(s): LIPASE, AMYLASE in the last 168 hours. No results for input(s): AMMONIA in the last 168 hours. Coagulation Profile: No results for input(s): INR, PROTIME in the last 168 hours. Cardiac Enzymes: No results for input(s): CKTOTAL, CKMB, CKMBINDEX, TROPONINI in the last 168 hours. BNP (last 3 results) No results for input(s): PROBNP in the last 8760 hours. HbA1C: No results for input(s): HGBA1C in the last 72 hours. CBG: Recent Labs  Lab 06/03/18 1712 06/03/18 2143 06/04/18 0809 06/04/18 1103 06/04/18 1627  GLUCAP 92 101* 84 116* 117*   Lipid Profile: No results for input(s): CHOL, HDL, LDLCALC, TRIG, CHOLHDL, LDLDIRECT in the last 72 hours. Thyroid Function Tests: No results for input(s): TSH, T4TOTAL, FREET4, T3FREE, THYROIDAB in the last 72 hours. Anemia Panel: No results for input(s): VITAMINB12, FOLATE, FERRITIN, TIBC, IRON, RETICCTPCT in the last 72 hours. Sepsis Labs: No results for input(s): PROCALCITON, LATICACIDVEN in the last 168 hours.  Recent Results (from the past 240 hour(s))  SARS Coronavirus 2 (CEPHEID - Performed in [place] hospital lab), Hosp Order     Status: None   Collection Time: 06/01/18 11:18 AM  Result Value Ref Range Status   SARS Coronavirus 2 NEGATIVE NEGATIVE Final    Comment: (NOTE) If result is NEGATIVE SARS-CoV-2 target nucleic acids are NOT DETECTED. The SARS-CoV-2 RNA is generally detectable in upper and lower  respiratory specimens during the acute phase of infection. The lowest  concentration of SARS-CoV-2 viral copies this assay can detect is 250  copies / mL. A negative result does not preclude SARS-CoV-2 infection  and should not be used as the sole basis for treatment or other  patient management decisions.  A negative result may occur with  improper specimen collection / handling, submission of specimen other  than nasopharyngeal swab, presence of  viral mutation(s) within the  areas targeted by this assay, and inadequate number of viral copies  (<250 copies / mL). A negative result must be combined with clinical  observations, patient history, and epidemiological information. If result is POSITIVE SARS-CoV-2 target nucleic acids are DETECTED. The SARS-CoV-2 RNA is generally detectable in upper and lower  respiratory specimens dur ing the acute phase of infection.  Positive  results are indicative of active infection with SARS-CoV-2.  Clinical  correlation with patient history and other diagnostic information is  necessary to determine patient infection status.  Positive results do  not rule out bacterial infection or co-infection with other viruses. If result is PRESUMPTIVE POSTIVE SARS-CoV-2 nucleic acids MAY BE PRESENT.   A presumptive positive result was obtained on the submitted specimen  and confirmed on repeat testing.  While 2019 novel coronavirus  (SARS-CoV-2) nucleic acids may  be present in the submitted sample  additional confirmatory testing may be necessary for epidemiological  and / or clinical management purposes  to differentiate between  SARS-CoV-2 and other Sarbecovirus currently known to infect humans.  If clinically indicated additional testing with an alternate test  methodology [phone]) is advised. The SARS-CoV-2 RNA is generally  detectable in upper and lower respiratory sp ecimens during the acute  phase of infection. The expected result is Negative. Fact Sheet for Patients:  StrictlyIdeas.no Fact Sheet for Healthcare Providers: BankingDealers.co.za This test is not yet approved or cleared by the [place] FDA and has been authorized for detection and/or diagnosis of SARS-CoV-2 by FDA under an Emergency Use Authorization (EUA).  This EUA will remain in effect (meaning this test can be used) for the duration of the COVID-19 declaration under Section  564(b)(1) of the Act, 21 U.S.C. section 360bbb-3(b)(1), unless the authorization is terminated or revoked sooner. Performed at [facility], [place] [address], [place], [place] [address]   Novel Coronavirus, NAA (hospital order; send-out to ref lab)     Status: None   Collection Time: 06/03/18  5:21 PM  Result Value Ref Range Status   SARS-CoV-2, NAA NOT DETECTED NOT DETECTED Final    Comment: (NOTE) This test was developed and its performance characteristics determined by [employer]. This test has not been FDA cleared or approved. This test has been authorized by FDA under an Emergency Use Authorization (EUA). This test is only authorized for the duration of time the declaration that circumstances exist justifying the authorization of the emergency use of in vitro diagnostic tests for detection of SARS-CoV-2 virus and/or diagnosis of COVID-19 infection under section 564(b)(1) of the Act, 21 U.S.C. 254YHC-6(C)(3), unless the authorization is terminated or revoked sooner. When diagnostic testing is negative, the possibility of a false negative result should be considered in the context of a patient's recent exposures and the presence of clinical signs and symptoms consistent with COVID-19. An individual without symptoms of COVID-19 and who is not shedding SARS-CoV-2 virus would expect to have a negative (not detected) result in this assay. Performed  At: [facility] [address] [place], [place] [id number] Rush Farmer MD [place]:[id number]    [place]  Final    Comment: Performed at [facility], [place] [address], [place], [place] [address]         Radiology Studies: No results found.      Scheduled Meds: . aspirin  81 mg Oral Daily  . brimonidine  1 drop Right Eye BID  . chlorhexidine  15 mL Mouth Rinse BID  . enoxaparin (LOVENOX) injection  40 mg Subcutaneous Q24H  . insulin aspart  0-9 Units Subcutaneous TID AC &  HS  . mouth rinse  15 mL Mouth Rinse q12n4p  . potassium & sodium phosphates  1 packet Oral TID WC & HS  . sodium chloride flush  10-40 mL Intracatheter Q12H  . sodium chloride flush  3 mL Intravenous Q12H  . timolol  1 drop Right Eye Daily   Continuous Infusions:   LOS: 11 days    Time spent: 25 minutes spent in the coordination of care today  Berle Mull   If 7PM-7AM, please contact night-coverage [email or web address] Password Kahi [place] 06/04/2018, 7:12 PM

## 2018-06-04 NOTE — Progress Notes (Signed)
CBC drawn earlier. Received a call from Lab that there wasn't an order for a CBC. Noted order had expired. Sent text to Dr.Blount to see if they wanted the lab or not. Order DC'd for the CBC. Lab notified at this time that order had been [place] so specimen not needed.

## 2018-06-04 NOTE — Progress Notes (Signed)
Nutrition Follow-up  INTERVENTION:   -Continue Vital Cuisine shake daily, provides 520 kcal and 22g protein -is nectar thick  -Continue Magic cup BID with meals, each supplement provides 290 kcal and 9 grams of protein  NUTRITION DIAGNOSIS:   Inadequate oral intake related to dysphagia as evidenced by NPO status.  Now on dysphagia 1 diet.  GOAL:   Patient will meet greater than or equal to 90% of their needs  Progressing.  MONITOR:   PO intake, Supplement acceptance, Labs, Weight trends, I & O's  ASSESSMENT:   85 year old with prior h/o of DM, left eye blindness, PVD, hypertension, presents with AMS, he was found to be in AKI, with SIRS, and with right upper chest shingles. Admitted for sepsis.  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 Pt likes the Vital Cuisine shake he gets with breakfast and drank <50% of it. Pt enjoys Magic Cups on his trays. Pt with some coughing but does better with spoon feeding than sipping.   Per weight records, weights have trended upward. Now +12 lb since 5/12. Per I/O's: +6.7L since admission.  Medications: Phos-Nak packet QID Labs reviewed: CBGs: 84-116  Diet Order:   Diet Order            DIET - DYS 1 Room service appropriate? No; Fluid consistency: Nectar Thick  Diet effective now              EDUCATION NEEDS:   Not appropriate for education at this time  Skin:  Skin Assessment: Skin Integrity Issues: Skin Integrity Issues:: Stage II Stage II: sacrum  Last BM:  5/20  Height:   Ht Readings from Last 1 Encounters:  05/26/18 5\' 6"  (1.676 m)    Weight:   Wt Readings from Last 1 Encounters:  06/01/18 72.8 kg    Ideal Body Weight:  64.5 kg(based on ht from 2016)  BMI:  Body mass index is 25.9 kg/m.  Estimated Nutritional Needs:   Kcal:  1600-1800  Protein:  75-85g  Fluid:  1.8L/day  Clayton Bibles, MS, RD, LDN [place] Dietitian Pager:  [phone] After Hours Pager: [phone]

## 2018-06-05 LAB — GLUCOSE, CAPILLARY: Glucose-Capillary: 74 mg/dL (ref 70–99)

## 2018-06-05 MED ORDER — RESOURCE THICKENUP CLEAR PO POWD: 1.0000 g | ORAL | 0 refills | Status: AC | PRN

## 2018-06-05 NOTE — Progress Notes (Signed)
Physical Therapy Treatment Patient Details Name: Curtis Brock. MRN: [id number] DOB: 05/22/1925 Today's Date: 06/05/2018    History of Present Illness 85 y.o. male admitted on 05/24/18 for AMS, hypothermia.  Pt dx with sepsis of unclear etiology (urine cultures pending, CXR does not show PNA), diarrhea (c diff negative), stage 2 sacral decubitus, hypokalemia, rash on upper chest wall suspicious for shingles.  Pt with significant PMH of L eye blindness, prior DM, PVD, L great toe ulceration s/p baloon angioplasty.      PT Comments    Limited visit by [facility] arrival for transfer, Patient AOx1, total A for bed mobility and transfer to transport to SNF today.  Unable to follow some commands. Patient with painful sacral wound, at times yelling out in pain.  Follow Up Recommendations  SNF     Equipment Recommendations  (TBD next venue)    Recommendations for Other Services       Precautions / Restrictions Precautions Precautions: Fall Restrictions Weight Bearing Restrictions: No    Mobility  Bed Mobility Overal bed mobility: Needs Assistance Bed Mobility: Supine to Sit;Sit to Supine     Supine to sit: Total assist Sit to supine: Total assist   General bed mobility comments: Assist for all aspects  Transfers Overall transfer level: Needs assistance Equipment used: Rolling walker (2 wheeled) Transfers: Sit to/from Stand Sit to Stand: Total assist         General transfer comment: assisted [facility] with total A transfer to stretcher for transport to SNF  Ambulation/Gait                 Stairs             Wheelchair Mobility    Modified Rankin (Stroke Patients Only)       Balance Overall balance assessment: Needs assistance Sitting-balance support: Feet supported;Bilateral upper extremity supported Sitting balance-Leahy Scale: Poor Sitting balance - Comments: Sat EOB x 12 minutes with min guard assist       Standing balance comment: Attempting to  stand, but pt unable to attempt without dependence- and resistance felt when attempted.                             Cognition Arousal/Alertness: Lethargic Behavior During Therapy: Flat affect Overall Cognitive Status: No family/caregiver present to determine baseline cognitive functioning                                 General Comments: Pt responding to some questions with answers like "yea" or "okay". Pt following some commands.       Exercises      General Comments        Pertinent Vitals/Pain Faces Pain Scale: No hurt    Home Living                      Prior Function            PT Goals (current goals can now be found in the care plan section) Acute Rehab PT Goals Patient Stated Goal: unable to state PT Goal Formulation: Patient unable to participate in goal setting Time For Goal Achievement: 06/09/18 Potential to Achieve Goals: Good Progress towards PT goals: Progressing toward goals    Frequency    Min 2X/week      PT Plan Current plan remains appropriate    Co-evaluation  AM-PAC PT "6 Clicks" Mobility   Outcome Measure  Help needed turning from your back to your side while in a flat bed without using bedrails?: Total Help needed moving from lying on your back to sitting on the side of a flat bed without using bedrails?: Total Help needed moving to and from a bed to a chair (including a wheelchair)?: Total Help needed standing up from a chair using your arms (e.g., wheelchair or bedside chair)?: Total Help needed to walk in hospital room?: Total Help needed climbing 3-5 steps with a railing? : Total 6 Click Score: 6    End of Session Equipment Utilized During Treatment: Gait belt Activity Tolerance: Patient tolerated treatment well Patient left: in bed;with call bell/phone within reach;with bed alarm set Nurse Communication: Mobility status PT Visit Diagnosis: Muscle weakness (generalized)  (M62.81);Difficulty in walking, not elsewhere classified (R26.2)     Time: 9450-3888 PT Time Calculation (min) (ACUTE ONLY): 17 min  Charges:  $Therapeutic Activity: 8-22 mins                     Reinaldo Berber, PT, DPT Acute Rehabilitation Services Pager: [phone] Office: [place] 06/05/2018, 11:49 AM

## 2018-06-05 NOTE — TOC Transition Note (Signed)
Transition of Care [facility]) - CM/SW Discharge Note   Patient Details  Name: Jaedan Huttner. MRN: [id number] Date of Birth: 26-May-1925  [facility] CM/SW Contact:  Eileen Stanford, LCSW Phone Number: 06/05/2018, 10:59 AM   Clinical Narrative:   Clinical Social Worker facilitated patient discharge including contacting patient family and facility to confirm patient discharge plans.  Clinical information faxed to facility and family agreeable with plan.  CSW arranged ambulance transport via [facility] to [place].  RN to call [phone] for report prior to discharge.    Final next level of care: Skilled Nursing Facility Barriers to Discharge: Continued Medical Work up, Ship broker   Patient Goals and CMS Choice Patient states their goals for this hospitalization and ongoing recovery are:: Pt daughter would like her father to get some rehab before returning home CMS Medicare.gov Compare Post Acute Care list provided to:: Patient Represenative (must comment)(Pt daughter, Cherene Julian) Choice offered to / list presented to : Adult Children  Discharge Placement              Patient chooses bed at: ([facility]) Patient to be transferred to facility by: [facility] Name of family member notified: Cherene Julian is aware Patient and family notified of of transfer: 06/05/18  Discharge Plan and Services In-house Referral: Clinical Social Work Discharge Planning Services: NA Post Acute Care Choice: [place]          DME Arranged: N/A DME Agency: NA       HH Arranged: NA HH Agency: NA        Social Determinants of Health (SDOH) Interventions     Readmission Risk Interventions No flowsheet data found.

## 2018-06-05 NOTE — Discharge Summary (Signed)
[facility] Discharge Summary   Patient: Curtis Brock. HQP:[id number]   PCP: [facility] DOB: 03/08/1925   Date of admission: 05/24/2018   Date of discharge:  06/05/2018    Discharge Diagnoses:  Principal Problem:   Sepsis ([place]) Active Problems:   Essential hypertension, benign   Altered mental status   Diabetes type 2, controlled ([place])   Sacral decubitus ulcer   Acute renal failure syndrome ([place])   Shingles   Admitted From: home Disposition:  SNF  Recommendations for Outpatient Follow-up:  1. Please follow up with PCP in 1 week    Contact information for follow-up providers    Pa, [place]. Schedule an appointment as soon as possible for a visit in 1 week(s).   Specialty:  Family Medicine Contact information: [place] [address] [phone]            Contact information for after-discharge care    Destination    [facility] .   Service:  Skilled Nursing Contact information: [place] [phone]                 Diet recommendation: dysphagia 1 diet nector thick Liquids provided via: Straw Medication Administration: Crushed with puree Supervision: Staff to assist with self feeding;Full supervision/cueing for compensatory strategies Compensations: Minimize environmental distractions;Slow rate;Small sips/bites Postural Changes and/or Swallow Maneuvers: Seated upright 90 degrees  Activity: The patient is advised to gradually reintroduce usual activities.  Discharge Condition: good  Code Status: full code  History of present illness: As per the H and P dictated on admission, "Hyder Deman. is a 85 y.o. male with medical history significant of left eye blindness; prior DM; and PVD with L great toe ulceration s/p balloon angioplasty 9/18 presenting with AMS.  She went down to bathe him this AM; he wasn't responding and he  has "messed everywhere."  He would open his eyes and look at her but wasn't able to talk.  Normally, he can talk and answer questions and carry on a conversation.  His daughter notices a sacral wound - he was getting wound care for it but it looks like it has opened back up.  The rash on his chest has been there for a few days; it was there in the past and she put cortisone cream on it and it cleared up but now it is the same thing.  He has not had the shingles vaccine.   ED Course:   Minimally verbal.  Requires full care at baseline but able to converse.  Minimally responsive this AM with loose stools overnight. Hypothermic and hypotensive with EMS.  Temp 97.6.  BP >100 here.  Treated as sepsis, given 1L IVF and giving another and antibiotics.  Rash on right chest - ?vesicular.  Dark, loose stool - heme negative.  COVID negative."  Hospital Course:  Summary of his active problems in the hospital is as following. 1. Sepsis  - Unclear source. Lactate normalized.  - CXR does not show pneumonia.  - UCx negative - Blood cultures have been negative so far.  - Pt had diarrhea but GI pathogen PCR and c diff PCR is negative.  - was treated withIV vancomycin, cefepime and flagyl; but were stopped as there was no clear indication of what was being treated and a negative procal - from a sepsis standpoint, that has been treated is is most likely  d/t his shingles -completed valtrex  2. Essential Hypertension:  - stable off meds, monitor   3. AKI: - resolved  4. Stage 2 sacral decubitus ulcer:  - Wound care consulted and recommendations given.   5. Shingles:  - Vesicular lesions on the right upper chest on admission, which had decreased in size, slightly crusted. They need to be covered.  - Currently on IV acyclovir. - rash now dried and crusting over - as PO intake improves, transition to PO valtrex  6.  Hypokalemia/hypophosphatemia - continueK-phos; this will likely need to be a daily supplement - resolved  7. Mild anemia of chronic disease:  - Monitor hemoglobin.  8. Type 2 DM:  - Diet controlled.  - Resume SSI while inpatient.  - Dietary consulted for recommendations. - Dysphagia 1 diet  9. Pressure ulcer POA Sacrum  Pressure Injury 05/25/18 Stage II -  Partial thickness loss of dermis presenting as a shallow open ulcer with a red, pink wound bed without slough. (Active)  05/25/18   Location: Sacrum  Location Orientation:   Staging: Stage II -  Partial thickness loss of dermis presenting as a shallow open ulcer with a red, pink wound bed without slough.  Wound Description (Comments):   Present on Admission: Yes    Patient was seen by physical therapy, who recommended SNF, which was arranged by Education officer, museum. On the day of the discharge the patient's vitals were stable , and no other acute medical condition were reported by patient. the patient was felt safe to be discharge at SNF with therapy.  Consultants: none Procedures: none  DISCHARGE MEDICATION: Allergies as of 06/05/2018   No Known Allergies     Medication List    TAKE these medications   acetaminophen 325 MG tablet Commonly known as:  TYLENOL Take by mouth every 6 (six) hours as needed for mild pain.   aspirin 81 MG chewable tablet Chew 81 mg by mouth daily.   brimonidine 0.2 % ophthalmic solution Commonly known as:  ALPHAGAN Place 1 drop into the right eye 2 (two) times daily.   ENDOFORM DERMAL TEMPLATE EX Apply 1 patch topically daily.   ferrous sulfate 325 (65 FE) MG tablet Take 325 mg by mouth daily with breakfast.   Geritol Liqd Take 5 mLs by mouth every other day.   hydrocortisone cream 1 % Apply 1 application topically as needed (rash).   multivitamin with minerals Tabs tablet Take 1 tablet by mouth daily.   Resource ThickenUp Clear Powd Take 1 g by mouth  as needed.   timolol 0.5 % ophthalmic solution Commonly known as:  TIMOPTIC Place 1 drop into the right eye daily.      No Known Allergies Discharge Instructions    DIET - DYS 1   Complete by:  As directed    Fluid consistency:  Nectar Thick   Increase activity slowly   Complete by:  As directed      Discharge Exam: Filed Weights   05/30/18 0500 05/31/18 0624 06/01/18 0404  Weight: 71.4 kg 73.6 kg 72.8 kg   Vitals:   06/05/18 0008 06/05/18 0834  BP: (!) 90/53 122/61  Pulse: 77 86  Resp: 14   Temp: 98.2 F (36.8 C) 97.8 F (36.6 C)  SpO2: 99% 98%   General: Appear in no distress, healed crusted shingles Rash; Oral Mucosa moist Cardiovascular: S1 and S2 Present, no Murmur, no JVD Respiratory: Bilateral Air entry present and Clear to Auscultation, no Crackles, no wheezes Abdomen: Bowel  Sound present, Soft and no tenderness Extremities: no Pedal edema, no calf tenderness Neurology: Grossly no focal neuro deficit.  The results of significant diagnostics from this hospitalization (including imaging, microbiology, ancillary and laboratory) are listed below for reference.    Significant Diagnostic Studies: Ct Head Wo Contrast  Result Date: 05/24/2018 CLINICAL DATA:  Unresponsive EXAM: CT HEAD WITHOUT CONTRAST TECHNIQUE: Contiguous axial images were obtained from the base of the skull through the vertex without intravenous contrast. COMPARISON:  CT brain, 10/16/2016 FINDINGS: Brain: No evidence of acute infarction, hemorrhage, hydrocephalus, extra-axial collection or mass lesion/mass effect. Extensive periventricular white matter hypodensity. Vascular: No hyperdense vessel or unexpected calcification. Skull: Normal. Negative for fracture or focal lesion. Sinuses/Orbits: No acute finding. Other: None. IMPRESSION: No acute intracranial pathology.  Small-vessel white matter disease. Electronically Signed   By: Eddie Candle M.D.   On: 05/24/2018 15:53   Dg Chest Port 1 View  Result  Date: 05/24/2018 CLINICAL DATA:  Hypothermia and altered mental status. EXAM: PORTABLE CHEST 1 VIEW COMPARISON:  05/13/2014 and prior radiographs FINDINGS: The cardiomediastinal silhouette is unremarkable. Elevated RIGHT hemidiaphragm again noted. There is no evidence of focal airspace disease, pulmonary edema, suspicious pulmonary nodule/mass, pleural effusion, or pneumothorax. No acute bony abnormalities are identified. IMPRESSION: No active disease. Electronically Signed   By: Margarette Canada M.D.   On: 05/24/2018 14:49    Microbiology: Recent Results (from the past 240 hour(s))  SARS Coronavirus 2 (CEPHEID - Performed in [place] hospital lab), Hosp Order     Status: None   Collection Time: 06/01/18 11:18 AM  Result Value Ref Range Status   SARS Coronavirus 2 NEGATIVE NEGATIVE Final    Comment: (NOTE) If result is NEGATIVE SARS-CoV-2 target nucleic acids are NOT DETECTED. The SARS-CoV-2 RNA is generally detectable in upper and lower  respiratory specimens during the acute phase of infection. The lowest  concentration of SARS-CoV-2 viral copies this assay can detect is 250  copies / mL. A negative result does not preclude SARS-CoV-2 infection  and should not be used as the sole basis for treatment or other  patient management decisions.  A negative result may occur with  improper specimen collection / handling, submission of specimen other  than nasopharyngeal swab, presence of viral mutation(s) within the  areas targeted by this assay, and inadequate number of viral copies  (<250 copies / mL). A negative result must be combined with clinical  observations, patient history, and epidemiological information. If result is POSITIVE SARS-CoV-2 target nucleic acids are DETECTED. The SARS-CoV-2 RNA is generally detectable in upper and lower  respiratory specimens dur ing the acute phase of infection.  Positive  results are indicative of active infection with SARS-CoV-2.  Clinical  correlation  with patient history and other diagnostic information is  necessary to determine patient infection status.  Positive results do  not rule out bacterial infection or co-infection with other viruses. If result is PRESUMPTIVE POSTIVE SARS-CoV-2 nucleic acids MAY BE PRESENT.   A presumptive positive result was obtained on the submitted specimen  and confirmed on repeat testing.  While 2019 novel coronavirus  (SARS-CoV-2) nucleic acids may be present in the submitted sample  additional confirmatory testing may be necessary for epidemiological  and / or clinical management purposes  to differentiate between  SARS-CoV-2 and other Sarbecovirus currently known to infect humans.  If clinically indicated additional testing with an alternate test  methodology [phone]) is advised. The SARS-CoV-2 RNA is generally  detectable in upper and lower  respiratory sp ecimens during the acute  phase of infection. The expected result is Negative. Fact Sheet for Patients:  StrictlyIdeas.no Fact Sheet for Healthcare Providers: BankingDealers.co.za This test is not yet approved or cleared by the [place] FDA and has been authorized for detection and/or diagnosis of SARS-CoV-2 by FDA under an Emergency Use Authorization (EUA).  This EUA will remain in effect (meaning this test can be used) for the duration of the COVID-19 declaration under Section 564(b)(1) of the Act, 21 U.S.C. section 360bbb-3(b)(1), unless the authorization is terminated or revoked sooner. Performed at [facility], [place] [address], [place], [place] [address]   Novel Coronavirus, NAA (hospital order; send-out to ref lab)     Status: None   Collection Time: 06/03/18  5:21 PM  Result Value Ref Range Status   SARS-CoV-2, NAA NOT DETECTED NOT DETECTED Final    Comment: (NOTE) This test was developed and its performance characteristics determined by [employer]. This test has not  been FDA cleared or approved. This test has been authorized by FDA under an Emergency Use Authorization (EUA). This test is only authorized for the duration of time the declaration that circumstances exist justifying the authorization of the emergency use of in vitro diagnostic tests for detection of SARS-CoV-2 virus and/or diagnosis of COVID-19 infection under section 564(b)(1) of the Act, 21 U.S.C. 295MWU-1(L)(2), unless the authorization is terminated or revoked sooner. When diagnostic testing is negative, the possibility of a false negative result should be considered in the context of a patient's recent exposures and the presence of clinical signs and symptoms consistent with COVID-19. An individual without symptoms of COVID-19 and who is not shedding SARS-CoV-2 virus would expect to have a negative (not detected) result in this assay. Performed  At: [facility] [address] [place], [place] [id number] Rush Farmer MD DG:[id number]    [place]  Final    Comment: Performed at [facility], [place] [address] [place],  [address]     Labs: CBC: Recent Labs  Lab 05/31/18 0500 06/01/18 0630 06/02/18 0525 06/03/18 0428 06/04/18 0612  WBC 4.4 5.4 6.4 6.5 6.2  NEUTROABS 2.2 3.3  --   --   --   HGB 11.0* 10.6* 10.4* 10.2* 10.5*  HCT 32.4* 32.1* 31.6* 31.8* 33.3*  MCV 79.6* 80.0 81.7 83.2 84.9  PLT 167 171 188 187 756   Basic Metabolic Panel: Recent Labs  Lab 05/30/18 0500 05/31/18 0500 06/01/18 0630  NA 139 142 142  K 2.9* 3.3* 3.5  CL 108 110 112*  CO2 22 23 25   GLUCOSE 121* 77 105*  BUN 13 15 17   CREATININE 0.86 0.83 0.98  CALCIUM 8.7* 8.5* 8.2*  MG 1.9 1.8 1.8  PHOS 1.8* 2.5 3.2   Liver Function Tests: Recent Labs  Lab 05/30/18 0500 05/31/18 0500 06/01/18 0630  ALBUMIN 1.9* 1.9* 1.9*   No results for input(s): LIPASE, AMYLASE in the last 168 hours. No results for input(s): AMMONIA in the last 168 hours.  Cardiac Enzymes: No results for input(s): CKTOTAL, CKMB, CKMBINDEX, TROPONINI in the last 168 hours. BNP (last 3 results) No results for input(s): BNP in the last 8760 hours. CBG: Recent Labs  Lab 06/04/18 0809 06/04/18 1103 06/04/18 1627 06/04/18 2115 06/05/18 0829  GLUCAP 84 116* 117* 180* 74   Time spent: 35 minutes  Signed:  Berle Mull  [facility]  06/05/2018

## 2018-07-03 ENCOUNTER — Encounter (HOSPITAL_BASED_OUTPATIENT_CLINIC_OR_DEPARTMENT_OTHER): Payer: Medicare HMO

## 2018-07-13 ENCOUNTER — Observation Stay (HOSPITAL_COMMUNITY)
Admission: EM | Admit: 2018-07-13 | Discharge: 2018-07-15 | Disposition: A | Discharge status: Home / Self Care | Payer: Medicare HMO | Attending: Internal Medicine | Admitting: Internal Medicine

## 2018-07-13 ENCOUNTER — Emergency Department (HOSPITAL_COMMUNITY): Payer: Medicare HMO

## 2018-07-13 ENCOUNTER — Encounter (HOSPITAL_COMMUNITY): Payer: Self-pay | Admitting: Emergency Medicine

## 2018-07-13 ENCOUNTER — Other Ambulatory Visit: Payer: Self-pay

## 2018-07-13 DIAGNOSIS — F0390 Unspecified dementia without behavioral disturbance: Secondary | ICD-10-CM

## 2018-07-13 DIAGNOSIS — I10 Essential (primary) hypertension: Secondary | ICD-10-CM | POA: Diagnosis not present

## 2018-07-13 DIAGNOSIS — E1169 Type 2 diabetes mellitus with other specified complication: Secondary | ICD-10-CM | POA: Diagnosis not present

## 2018-07-13 DIAGNOSIS — Z8673 Personal history of transient ischemic attack (TIA), and cerebral infarction without residual deficits: Secondary | ICD-10-CM | POA: Diagnosis not present

## 2018-07-13 DIAGNOSIS — R4189 Other symptoms and signs involving cognitive functions and awareness: Secondary | ICD-10-CM

## 2018-07-13 DIAGNOSIS — E119 Type 2 diabetes mellitus without complications: Secondary | ICD-10-CM | POA: Insufficient documentation

## 2018-07-13 DIAGNOSIS — G9340 Encephalopathy, unspecified: Principal | ICD-10-CM | POA: Diagnosis present

## 2018-07-13 DIAGNOSIS — Z1159 Encounter for screening for other viral diseases: Secondary | ICD-10-CM | POA: Insufficient documentation

## 2018-07-13 DIAGNOSIS — L89159 Pressure ulcer of sacral region, unspecified stage: Secondary | ICD-10-CM | POA: Insufficient documentation

## 2018-07-13 DIAGNOSIS — L089 Local infection of the skin and subcutaneous tissue, unspecified: Secondary | ICD-10-CM

## 2018-07-13 DIAGNOSIS — R464 Slowness and poor responsiveness: Secondary | ICD-10-CM | POA: Diagnosis present

## 2018-07-13 HISTORY — DX: Hyperlipidemia, unspecified: E78.5

## 2018-07-13 HISTORY — DX: Cerebral infarction, unspecified: I63.9

## 2018-07-13 LAB — CBC WITH DIFFERENTIAL/PLATELET
Abs Immature Granulocytes: 0.07 10*3/uL (ref 0.00–0.07)
Basophils Absolute: 0 10*3/uL (ref 0.0–0.1)
Basophils Relative: 0 %
Eosinophils Absolute: 0.2 10*3/uL (ref 0.0–0.5)
Eosinophils Relative: 2 %
HCT: 33.7 % — ABNORMAL LOW (ref 39.0–52.0)
Hemoglobin: 10.3 g/dL — ABNORMAL LOW (ref 13.0–17.0)
Immature Granulocytes: 1 %
Lymphocytes Relative: 19 %
Lymphs Abs: 1.5 10*3/uL (ref 0.7–4.0)
MCH: 26.8 pg (ref 26.0–34.0)
MCHC: 30.6 g/dL (ref 30.0–36.0)
MCV: 87.5 fL (ref 80.0–100.0)
Monocytes Absolute: 0.5 10*3/uL (ref 0.1–1.0)
Monocytes Relative: 7 %
Neutro Abs: 5.6 10*3/uL (ref 1.7–7.7)
Neutrophils Relative %: 71 %
Platelets: 420 10*3/uL — ABNORMAL HIGH (ref 150–400)
RBC: 3.85 MIL/uL — ABNORMAL LOW (ref 4.22–5.81)
RDW: 15.6 % — ABNORMAL HIGH (ref 11.5–15.5)
WBC: 7.9 10*3/uL (ref 4.0–10.5)
nRBC: 0 % (ref 0.0–0.2)

## 2018-07-13 LAB — URINALYSIS, ROUTINE W REFLEX MICROSCOPIC
Bilirubin Urine: NEGATIVE
Glucose, UA: NEGATIVE mg/dL
Ketones, ur: NEGATIVE mg/dL
Nitrite: NEGATIVE
Protein, ur: 30 mg/dL — AB
Specific Gravity, Urine: 1.02 (ref 1.005–1.030)
pH: 5 (ref 5.0–8.0)

## 2018-07-13 LAB — VITAMIN B12: Vitamin B-12: 2332 pg/mL — ABNORMAL HIGH (ref 180–914)

## 2018-07-13 LAB — COMPREHENSIVE METABOLIC PANEL
ALT: 8 U/L (ref 0–44)
AST: 17 U/L (ref 15–41)
Albumin: 2 g/dL — ABNORMAL LOW (ref 3.5–5.0)
Alkaline Phosphatase: 72 U/L (ref 38–126)
Anion gap: 8 (ref 5–15)
BUN: 13 mg/dL (ref 8–23)
CO2: 24 mmol/L (ref 22–32)
Calcium: 8.2 mg/dL — ABNORMAL LOW (ref 8.9–10.3)
Chloride: 108 mmol/L (ref 98–111)
Creatinine, Ser: 0.75 mg/dL (ref 0.61–1.24)
GFR calc Af Amer: >60 mL/min (ref >60)
GFR calc non Af Amer: >60 mL/min (ref >60)
Glucose, Bld: 144 mg/dL — ABNORMAL HIGH (ref 70–99)
Potassium: 4.5 mmol/L (ref 3.5–5.1)
Sodium: 140 mmol/L (ref 135–145)
Total Bilirubin: 0.5 mg/dL (ref 0.3–1.2)
Total Protein: 6.1 g/dL — ABNORMAL LOW (ref 6.5–8.1)

## 2018-07-13 LAB — LACTIC ACID, PLASMA
Lactic Acid, Venous: 2.7 mmol/L (ref 0.5–1.9)
Lactic Acid, Venous: 1.3 mmol/L (ref 0.5–1.9)

## 2018-07-13 LAB — TSH: TSH: 2.387 u[IU]/mL (ref 0.350–4.500)

## 2018-07-13 LAB — TROPONIN I (HIGH SENSITIVITY): Troponin I (High Sensitivity): 9 ng/L (ref <18)

## 2018-07-13 LAB — LIPASE, BLOOD: Lipase: 22 U/L (ref 11–51)

## 2018-07-13 LAB — SARS CORONAVIRUS 2 BY RT PCR (HOSPITAL ORDER, PERFORMED IN ~~LOC~~ HOSPITAL LAB): SARS Coronavirus 2: NEGATIVE

## 2018-07-13 MED ORDER — ONDANSETRON HCL 4 MG/2ML IJ SOLN: 4.0000 mg | Freq: Four times a day (QID) | INTRAMUSCULAR | Status: DC | PRN

## 2018-07-13 MED ORDER — HYDROCORTISONE 1 % EX CREA
1.0000 "application " | TOPICAL_CREAM | CUTANEOUS | Status: DC | PRN
  Filled 2018-07-13: qty 28

## 2018-07-13 MED ORDER — BRIMONIDINE TARTRATE 0.2 % OP SOLN
1.0000 [drp] | Freq: Two times a day (BID) | OPHTHALMIC | Status: DC
  Administered 2018-07-13 – 2018-07-14 (×3): 1 [drp] via OPHTHALMIC
  Filled 2018-07-13: qty 5

## 2018-07-13 MED ORDER — METRONIDAZOLE IN NACL 5-0.79 MG/ML-% IV SOLN
500.0000 mg | Freq: Once | INTRAVENOUS | Status: AC
  Administered 2018-07-13: 500 mg via INTRAVENOUS
  Filled 2018-07-13: qty 100

## 2018-07-13 MED ORDER — SODIUM CHLORIDE 0.9 % IV SOLN
INTRAVENOUS | Status: DC
  Administered 2018-07-13 – 2018-07-14 (×3): via INTRAVENOUS

## 2018-07-13 MED ORDER — GERITOL TONIC PO LIQD: 5.0000 mL | ORAL | Status: DC

## 2018-07-13 MED ORDER — ACETAMINOPHEN 325 MG PO TABS: 650.0000 mg | ORAL_TABLET | Freq: Four times a day (QID) | ORAL | Status: DC | PRN

## 2018-07-13 MED ORDER — VANCOMYCIN HCL IN DEXTROSE 1-5 GM/200ML-% IV SOLN: 1000.0000 mg | Freq: Once | INTRAVENOUS | Status: DC

## 2018-07-13 MED ORDER — TIMOLOL MALEATE 0.5 % OP SOLN
1.0000 [drp] | Freq: Every day | OPHTHALMIC | Status: DC
  Administered 2018-07-13 – 2018-07-14 (×2): 1 [drp] via OPHTHALMIC
  Filled 2018-07-13: qty 5

## 2018-07-13 MED ORDER — SODIUM CHLORIDE 0.9 % IV SOLN
2.0000 g | Freq: Two times a day (BID) | INTRAVENOUS | Status: DC
  Administered 2018-07-14 (×2): 2 g via INTRAVENOUS
  Filled 2018-07-13 (×2): qty 2

## 2018-07-13 MED ORDER — ENOXAPARIN SODIUM 40 MG/0.4ML ~~LOC~~ SOLN
40.0000 mg | SUBCUTANEOUS | Status: DC
  Administered 2018-07-13 – 2018-07-14 (×2): 40 mg via SUBCUTANEOUS
  Filled 2018-07-13 (×2): qty 0.4

## 2018-07-13 MED ORDER — ADULT MULTIVITAMIN W/MINERALS CH
1.0000 | ORAL_TABLET | Freq: Every day | ORAL | Status: DC
  Administered 2018-07-13 – 2018-07-14 (×2): 1 via ORAL
  Filled 2018-07-13 (×3): qty 1

## 2018-07-13 MED ORDER — SODIUM CHLORIDE 0.9 % IV BOLUS
1000.0000 mL | Freq: Once | INTRAVENOUS | Status: AC
  Administered 2018-07-13: 1000 mL via INTRAVENOUS

## 2018-07-13 MED ORDER — VANCOMYCIN HCL 10 G IV SOLR
1500.0000 mg | Freq: Once | INTRAVENOUS | Status: AC
  Administered 2018-07-13: 1500 mg via INTRAVENOUS
  Filled 2018-07-13: qty 1500

## 2018-07-13 MED ORDER — SODIUM CHLORIDE 0.9 % IV SOLN
2.0000 g | Freq: Once | INTRAVENOUS | Status: AC
  Administered 2018-07-13: 2 g via INTRAVENOUS
  Filled 2018-07-13: qty 2

## 2018-07-13 MED ORDER — RESOURCE THICKENUP CLEAR PO POWD
1.0000 g | ORAL | Status: DC | PRN
  Filled 2018-07-13: qty 125

## 2018-07-13 MED ORDER — ASPIRIN 81 MG PO CHEW
81.0000 mg | CHEWABLE_TABLET | Freq: Every day | ORAL | Status: DC
  Administered 2018-07-13 – 2018-07-14 (×2): 81 mg via ORAL
  Filled 2018-07-13 (×3): qty 1

## 2018-07-13 MED ORDER — FERROUS SULFATE 325 (65 FE) MG PO TABS
325.0000 mg | ORAL_TABLET | Freq: Every day | ORAL | Status: DC
  Administered 2018-07-14: 325 mg via ORAL
  Filled 2018-07-13 (×2): qty 1

## 2018-07-13 MED ORDER — ONDANSETRON HCL 4 MG PO TABS: 4.0000 mg | ORAL_TABLET | Freq: Four times a day (QID) | ORAL | Status: DC | PRN

## 2018-07-13 MED ORDER — VANCOMYCIN HCL 10 G IV SOLR
1500.0000 mg | INTRAVENOUS | Status: DC
  Filled 2018-07-13: qty 1500

## 2018-07-13 MED ORDER — INSULIN ASPART 100 UNIT/ML ~~LOC~~ SOLN: 0.0000 [IU] | Freq: Three times a day (TID) | SUBCUTANEOUS | Status: DC

## 2018-07-13 NOTE — ED Provider Notes (Signed)
Emergency Department Provider Note   I have reviewed the triage vital signs and the nursing notes.   HISTORY  Chief Complaint unresponsive   HPI Curtis Brock. is a 85 y.o. male with PMH of CVA, left eye blindness, DM, and sacral decubitus ulcer presents to the emergency department for evaluation after becoming suddenly unresponsive while at his wound clinic appointment.  Patient was apparently accompanied by his daughter who is in route.  EMS report that they arrived on scene to find the patient cool, clammy, but with normal blood sugar in the 150s.  Initial blood pressure was in the low 798 systolic.  EMS reports that the patient became suddenly unresponsive in the wound clinic office but did not require assist ventilation or CPR.  At baseline, the patient will answer questions if asked and has baseline blindness in the left eye.   Level 5 caveat: AMS  Past Medical History:  Diagnosis Date  . Blind left eye   . Diabetes mellitus without complication ([place])   . Hyperlipemia   . Stroke [facility])     Patient Active Problem List   Diagnosis Date Noted  . Sepsis ([place]) 05/24/2018  . Shingles 05/24/2018  . Acute kidney injury ([place])   . Acute renal failure syndrome ([place])   . Sacral decubitus ulcer   . CVA (cerebral vascular accident) ([place])   . Pulmonary hypertension ([place])   . Metabolic acidosis   . Midline low back pain without sciatica   . Altered mental status   . Hypothermia   . Hyperkalemia   . Diabetes type 2, controlled ([place])   . SIRS (systemic inflammatory response syndrome) ([place]) 05/01/2014  . Acute encephalopathy 05/01/2014  . Fungal rash of trunk 05/01/2014  . Heel spur 05/04/2013  . Weakness 04/17/2013  . Anemia of other chronic disease 06/10/2012  . Essential hypertension, benign 06/03/2012  . Type II or unspecified type diabetes mellitus with neurological manifestations, not stated as uncontrolled(250.60) 06/03/2012  . Abnormality of gait 05/28/2012  . Type I  (juvenile type) diabetes mellitus with peripheral circulatory disorders, not stated as uncontrolled(250.71) 05/28/2012  . Unspecified hereditary and idiopathic peripheral neuropathy 05/28/2012  . Unspecified late effects of cerebrovascular disease 05/28/2012  . Weakness generalized 05/11/2012  . Blind left eye 12/11/2011  . Neuropathy 12/11/2011  . Elevated LFTs 11/12/2011  . Hyperlipidemia 03/26/2011  . CVA (cerebral infarction) 03/26/2011    Past Surgical History:  Procedure Laterality Date  . ABDOMINAL AORTOGRAM W/LOWER EXTREMITY N/A 10/02/2016   Procedure: ABDOMINAL AORTOGRAM W/LOWER EXTREMITY;  Surgeon: Waynetta Sandy, MD;  Location: [place] [facility];  Service: Cardiovascular;  Laterality: N/A;  . PERIPHERAL VASCULAR BALLOON ANGIOPLASTY Left 10/02/2016   Procedure: PERIPHERAL VASCULAR BALLOON ANGIOPLASTY;  Surgeon: Waynetta Sandy, MD;  Location: [place] [facility];  Service: Cardiovascular;  Laterality: Left;    Allergies Patient has no known allergies.  No family history on file.  Social History Social History   Tobacco Use  . Smoking status: Never Smoker  . Smokeless tobacco: Never Used  Substance Use Topics  . Alcohol use: No  . Drug use: No    Review of Systems  Level 5 caveat: AMS  ____________________________________________   PHYSICAL EXAM:  VITAL SIGNS: Vitals:   07/13/18 1800 07/13/18 1830  BP: (!) 142/69 (!) 144/90  Pulse: 78 95  Resp: (!) 8 19  Temp:    SpO2: 100% 100%   Constitutional: Eyes tightly clinched. No obvious seizure activity.  Eyes: Conjunctivae  are normal. Head: Atraumatic. Nose: No congestion/rhinnorhea. Mouth/Throat: Mucous membranes are moist.  Neck: No stridor.  Cardiovascular: Normal rate, regular rhythm. Good peripheral circulation. Grossly normal heart sounds.   Respiratory: Normal respiratory effort.  No retractions. Lungs CTAB. Gastrointestinal: Soft and nontender. No distention.   Musculoskeletal: Upper extremities somewhat contracted.  Neurologic: Eyes clinched. Not following commands. Moving hands/feet bilaterally.  Skin:  Skin is warm centrally and cool in the extremities. No cyanosis. Known stage 4 decubitus ulcer over the sacrum.   ____________________________________________   LABS (all labs ordered are listed, but only abnormal results are displayed)  Labs Reviewed  LACTIC ACID, PLASMA - Abnormal; Notable for the following components:      Result Value   Lactic Acid, Venous 2.7 (*)    All other components within normal limits  CBC WITH DIFFERENTIAL/PLATELET - Abnormal; Notable for the following components:   RBC 3.85 (*)    Hemoglobin 10.3 (*)    HCT 33.7 (*)    RDW 15.6 (*)    Platelets 420 (*)    All other components within normal limits  URINALYSIS, ROUTINE W REFLEX MICROSCOPIC - Abnormal; Notable for the following components:   Color, Urine AMBER (*)    APPearance HAZY (*)    Hgb urine dipstick MODERATE (*)    Protein, ur 30 (*)    Leukocytes,Ua LARGE (*)    Bacteria, UA RARE (*)    All other components within normal limits  COMPREHENSIVE METABOLIC PANEL - Abnormal; Notable for the following components:   Glucose, Bld 144 (*)    Calcium 8.2 (*)    Total Protein 6.1 (*)    Albumin 2.0 (*)    All other components within normal limits  SARS CORONAVIRUS 2 (HOSPITAL ORDER, [place] [facility])  CULTURE, BLOOD (ROUTINE X 2)  CULTURE, BLOOD (ROUTINE X 2)  URINE CULTURE  LACTIC ACID, PLASMA  LIPASE, BLOOD  TROPONIN I (HIGH SENSITIVITY)  TSH  VITAMIN B12  VITAMIN B1  FOLATE RBC  TSH  BASIC METABOLIC PANEL  CBC  URINALYSIS, ROUTINE W REFLEX MICROSCOPIC   ____________________________________________  EKG   EKG Interpretation  Date/Time:  Monday July 13 2018 11:24:09 EDT Ventricular Rate:  72 PR Interval:    QRS Duration: 116 QT Interval:  572 QTC Calculation: 627 R Axis:   51 Text Interpretation:  Sinus rhythm  Nonspecific intraventricular conduction delay Borderline low voltage, extremity leads No STEMI  Confirmed by Nanda Quinton [phone]) on 07/13/2018 11:25:58 AM       ____________________________________________  RADIOLOGY  Ct Head Wo Contrast  Result Date: 07/13/2018 CLINICAL DATA:  Altered level of consciousness. EXAM: CT HEAD WITHOUT CONTRAST TECHNIQUE: Contiguous axial images were obtained from the base of the skull through the vertex without intravenous contrast. COMPARISON:  05/24/2018 FINDINGS: Brain: Moderate atrophy and moderate chronic microvascular ischemic changes in the white matter similar to the prior study. Negative for acute infarct, hemorrhage, mass. Vascular: Negative for hyperdense vessel Skull: Negative skull.  Poor dentition. Sinuses/Orbits: Negative Other: None IMPRESSION: No acute abnormality.  Moderate atrophy and chronic ischemic change. Electronically Signed   By: Franchot Gallo M.D.   On: 07/13/2018 13:36   Dg Chest Portable 1 View  Result Date: 07/13/2018 CLINICAL DATA:  Altered mental status EXAM: PORTABLE CHEST 1 VIEW COMPARISON:  05/24/2018 FINDINGS: Cardiac shadow is within normal limits. Aortic calcifications are again identified. Elevation of the right hemidiaphragm is seen. Minimal blunting of the costophrenic angle on the right is noted likely  related to the elevated hemidiaphragm. No focal infiltrate or effusion is seen. No acute bony abnormality is noted. IMPRESSION: No acute abnormality noted. Electronically Signed   By: Inez Catalina M.D.   On: 07/13/2018 11:58    ____________________________________________   PROCEDURES  Procedure(s) performed:   Procedures  CRITICAL CARE Performed by: Margette Fast Total critical care time: 35 minutes Critical care time was exclusive of separately billable procedures and treating other patients. Critical care was necessary to treat or prevent imminent or life-threatening deterioration. Critical care was time spent  personally by me on the following activities: development of treatment plan with patient and/or surrogate as well as nursing, discussions with consultants, evaluation of patient's response to treatment, examination of patient, obtaining history from patient or surrogate, ordering and performing treatments and interventions, ordering and review of laboratory studies, ordering and review of radiographic studies, pulse oximetry and re-evaluation of patient's condition.  Nanda Quinton, MD Emergency Medicine  ____________________________________________   INITIAL IMPRESSION / ASSESSMENT AND PLAN / ED COURSE  Pertinent labs & imaging results that were available during my care of the patient were reviewed by me and considered in my medical decision making (see chart for details).   Patient presents to the emergency department from wound clinic with report of becoming suddenly unresponsive.  Patient not following commands but does appear to be protecting his airway.  No clear seizure activity.  Somewhat contracted upper extremities.  Awaiting daughter's arrival to provide additional history and description of his baseline health status.  According to EMS the patient is full code.  Blood sugar 150s with EMS.  Plan for screening labs including, chest x-ray, and CT head. No concern at this time for seizure. Attempted to reach patient's family without success.   Covering for sepsis with elevated lactate. Multiple hemolyzed CMP samples. Imaging reviewed. Plan for admit for abx and wound care. Care transferred to Dr. Melina Copa pending remaining labs and admit.  ____________________________________________  FINAL CLINICAL IMPRESSION(S) / ED DIAGNOSES  Final diagnoses:  Unresponsive episode  Wound infection     MEDICATIONS GIVEN DURING THIS VISIT:  Medications  acetaminophen (TYLENOL) tablet 650 mg (has no administration in time range)  aspirin chewable tablet 81 mg (has no administration in time range)   ferrous sulfate tablet 325 mg (has no administration in time range)  Resource ThickenUp Clear 1 g (has no administration in time range)  Geritol LIQD 5 mL (has no administration in time range)  multivitamin with minerals tablet 1 tablet (has no administration in time range)  brimonidine (ALPHAGAN) 0.2 % ophthalmic solution 1 drop (has no administration in time range)  timolol (TIMOPTIC) 0.5 % ophthalmic solution 1 drop (has no administration in time range)  hydrocortisone cream 1 % 1 application (has no administration in time range)  enoxaparin (LOVENOX) injection 40 mg (has no administration in time range)  0.9 %  sodium chloride infusion (has no administration in time range)  ondansetron (ZOFRAN) tablet 4 mg (has no administration in time range)    Or  ondansetron (ZOFRAN) injection 4 mg (has no administration in time range)  insulin aspart (novoLOG) injection 0-9 Units (has no administration in time range)  sodium chloride 0.9 % bolus 1,000 mL (0 mLs Intravenous Stopped 07/13/18 1400)  ceFEPIme (MAXIPIME) 2 g in sodium chloride 0.9 % 100 mL IVPB (0 g Intravenous Stopped 07/13/18 1402)  metroNIDAZOLE (FLAGYL) IVPB 500 mg (0 mg Intravenous Stopped 07/13/18 1511)  vancomycin (VANCOCIN) 1,500 mg in sodium chloride 0.9 % 500  mL IVPB (0 mg Intravenous Stopped 07/13/18 1537)    Note:  This document was prepared using Dragon voice recognition software and may include unintentional dictation errors.  Nanda Quinton, MD Emergency Medicine    Marcello Tuzzolino, [facility], MD 07/13/18 Lurline Hare

## 2018-07-13 NOTE — Progress Notes (Signed)
Pharmacy Antibiotic Note  Curtis Brock. is a 85 y.o. male admitted on 07/13/2018 with sepsis.  Pharmacy has been consulted for vancomycin/cefepime dosing. Afebrile, WBC wnl, LA 2.7>>1.3. SCr 0.75.  Plan: Continue Cefepime 2g IV every 12 hours Continue Vancomycin 1500mg  IV every 24 hours. Est AUC 514 using SCr 0.8  Monitor clinical progress, c/s, renal function F/u de-escalation plan/LOT, vancomycin levels as indicated F/u SCr on admit to enter antibiotic maintenance doses      Temp (24hrs), Avg:97.2 F (36.2 C), Min:97.2 F (36.2 C), Max:97.2 F (36.2 C)  Recent Labs  Lab 07/13/18 1135 07/13/18 1404 07/13/18 1500  WBC 7.9  --   --   CREATININE  --   --  0.75  LATICACIDVEN 2.7* 1.3  --     CrCl cannot be calculated (Unknown ideal weight.).    No Known Allergies  Antimicrobials this admission: 6/29 vancomycin >>  6/29 cefepime >>  6/29 Flagyl x 1  Dose adjustments this admission:   Microbiology results:   Sloan Leiter, PharmD, BCPS, BCCCP Clinical Pharmacist Please refer to [facility] for [place] numbers 07/13/2018 7:32 PM

## 2018-07-13 NOTE — ED Provider Notes (Signed)
85 year old male brought in from doctor's office where he had an unresponsive episode.  He initially had an elevated lactate which is cleared.  He is getting an infectious work-up and has noted to have a sacral wound.  He is received antibiotics-cefepime Flagyl Vanco. Physical Exam  BP (!) 162/99   Pulse 90   Temp (!) 97.2 F (36.2 C) (Rectal)   Resp (!) 25   SpO2 100%   Physical Exam  ED Course/Procedures   Clinical Course as of Jul 14 907  Mon Jul 13, 2018  1609 he has had multiple sticks trying to get a chemistry.  His heart rate and blood pressure remained stable.  He is not requiring additional oxygen.   [MB]    Clinical Course User Index [MB] Hayden Rasmussen, MD    Procedures  MDM  Plan is to follow-up on the patient's labs and infectious work-up and he will need to be admitted to the hospital for continued management.       Hayden Rasmussen, MD 07/14/18 [phone]

## 2018-07-13 NOTE — ED Triage Notes (Signed)
Pt arrives via EMS from doctor office where they reported pt went unresponsive. Pt has pressure ulcer to bottom, blind. Pt responds to name on exam.

## 2018-07-13 NOTE — ED Notes (Signed)
Patient transported to CT 

## 2018-07-13 NOTE — H&P (Signed)
[facility] History and Physical  Curtis Brock. VEL:[id number] DOB: Apr 26, 1925 DOA: 07/13/2018  PCP: Pa, [place]  Patient coming from: Home/wound clinic  Chief Complaint: Unresponsiveness  HPI: Curtis Brock. is a 85 y.o. male with a medical history of left eye blindness, diabetes mellitus, dementia, who presented to the emergency department with altered mental status.  Information detailed in this history and physical were obtained from the ED note as no family was available.  Patient currently only alert to himself.  It seems that patient was at a wound clinic appointment today and suddenly became unresponsive.  He was accompanied with his daughter.  EMS reported that they found him to be cool, clammy, with blood sugar of 150s upon arrival.  Blood pressure was 258 systolically.  Currently patient is awake and alert, only to self.  He has no complaints of shortness of breath, chest pain, abdominal pain, nausea or vomiting, diarrhea or constipation, dizziness or headache, with urination, recent illness or ill contacts.  ED Course: Thought to have sepsis given his lactic acid.  Was given Flagyl, vancomycin, cefepime.  Chest x-ray as well as CT head scan unrevealing.TRH called for admission.  Review of Systems:  All other systems reviewed and are negative.   Past Medical History:  Diagnosis Date  . Blind left eye   . Diabetes mellitus without complication ([place])   . Hyperlipemia   . Stroke [facility])     Past Surgical History:  Procedure Laterality Date  . ABDOMINAL AORTOGRAM W/LOWER EXTREMITY N/A 10/02/2016   Procedure: ABDOMINAL AORTOGRAM W/LOWER EXTREMITY;  Surgeon: Waynetta Sandy, MD;  Location: [place] [facility];  Service: Cardiovascular;  Laterality: N/A;  . PERIPHERAL VASCULAR BALLOON ANGIOPLASTY Left 10/02/2016   Procedure: PERIPHERAL VASCULAR BALLOON ANGIOPLASTY;  Surgeon: Waynetta Sandy, MD;  Location: [place] [facility];   Service: Cardiovascular;  Laterality: Left;    Social History:  reports that he has never smoked. He has never used smokeless tobacco. He reports that he does not drink alcohol or use drugs.  No Known Allergies  No family history on file.   Prior to Admission medications   Medication Sig Start Date End Date Taking? Authorizing Provider  acetaminophen (TYLENOL) 325 MG tablet Take by mouth every 6 (six) hours as needed for mild pain.    [provider]  aspirin 81 MG chewable tablet Chew 81 mg by mouth daily.    [provider]  brimonidine (ALPHAGAN) 0.2 % ophthalmic solution Place 1 drop into the right eye 2 (two) times daily.    [provider]  Collagen Matrix, [place], (ENDOFORM DERMAL TEMPLATE EX) Apply 1 patch topically daily.    [provider]  ferrous sulfate 325 (65 FE) MG tablet Take 325 mg by mouth daily with breakfast.    [provider]  hydrocortisone cream 1 % Apply 1 application topically as needed (rash).    [provider]  Iron-Vitamins (GERITOL) LIQD Take 5 mLs by mouth every other day.    [provider]  Maltodextrin-Xanthan Gum (RESOURCE THICKENUP CLEAR) POWD Take 1 g by mouth as needed. 06/05/18   Lavina Hamman, MD  Multiple Vitamin (MULTIVITAMIN WITH MINERALS) TABS tablet Take 1 tablet by mouth daily.    [provider]  timolol (TIMOPTIC) 0.5 % ophthalmic solution Place 1 drop into the right eye daily.  07/29/16   [provider]    Physical Exam: Vitals:   07/13/18 1400 07/13/18 1430  BP: (!) 162/99 (!) 146/77  Pulse: 90 83  Resp: (!) 25 19  Temp:    SpO2: 100% 100%     General: Well developed, well nourished, NAD, appears stated age  HEENT: NCAT, PERRLA, EOMI, Anicteic Sclera, mucous membranes mildly dry  Neck: Supple, no JVD, no masses   Cardiovascular: S1 S2 auscultated, no rubs, murmurs or gallops. Regular rate and rhythm.  Respiratory: Clear to auscultation  bilaterally with equal chest rise  Abdomen: Soft, nontender, nondistended, + bowel sounds  Extremities: warm dry without cyanosis clubbing or edema.   Neuro: AAOx1 (self only), answers questions appropriately but continues to repeat everything.  Patient with chronic left eye blindness, otherwise cranial nerves intact as tested.  Strength not tested  Skin: Without rashes exudates or nodules  Psych: Normal affect and demeanor with intact judgement and insight  Labs on Admission: I have personally reviewed following labs and imaging studies CBC: Recent Labs  Lab 07/13/18 1135  WBC 7.9  NEUTROABS 5.6  HGB 10.3*  HCT 33.7*  MCV 87.5  PLT 563*   Basic Metabolic Panel: Recent Labs  Lab 07/13/18 1500  NA 140  K 4.5  CL 108  CO2 24  GLUCOSE 144*  BUN 13  CREATININE 0.75  CALCIUM 8.2*   GFR: CrCl cannot be calculated (Unknown ideal weight.). Liver Function Tests: Recent Labs  Lab 07/13/18 1500  AST 17  ALT 8  ALKPHOS 72  BILITOT 0.5  PROT 6.1*  ALBUMIN 2.0*   Recent Labs  Lab 07/13/18 1500  LIPASE 22   No results for input(s): AMMONIA in the last 168 hours. Coagulation Profile: No results for input(s): INR, PROTIME in the last 168 hours. Cardiac Enzymes: No results for input(s): CKTOTAL, CKMB, CKMBINDEX, TROPONINI in the last 168 hours. BNP (last 3 results) No results for input(s): PROBNP in the last 8760 hours. HbA1C: No results for input(s): HGBA1C in the last 72 hours. CBG: No results for input(s): GLUCAP in the last 168 hours. Lipid Profile: No results for input(s): CHOL, HDL, LDLCALC, TRIG, CHOLHDL, LDLDIRECT in the last 72 hours. Thyroid Function Tests: No results for input(s): TSH, T4TOTAL, FREET4, T3FREE, THYROIDAB in the last 72 hours. Anemia Panel: No results for input(s): VITAMINB12, FOLATE, FERRITIN, TIBC, IRON, RETICCTPCT in the last 72 hours. Urine analysis:    Component Value Date/Time   COLORURINE AMBER (A) 05/24/2018 2351    APPEARANCEUR CLOUDY (A) 05/24/2018 2351   LABSPEC 1.028 05/24/2018 2351   PHURINE 5.0 05/24/2018 2351   GLUCOSEU NEGATIVE 05/24/2018 2351   HGBUR LARGE (A) 05/24/2018 2351   BILIRUBINUR NEGATIVE 05/24/2018 2351   KETONESUR NEGATIVE 05/24/2018 2351   PROTEINUR 30 (A) 05/24/2018 2351   UROBILINOGEN 0.2 05/01/2014 0618   NITRITE POSITIVE (A) 05/24/2018 2351   LEUKOCYTESUR LARGE (A) 05/24/2018 2351   Sepsis Labs: @LABRCNTIP (procalcitonin:4,lacticidven:4) ) Recent Results (from the past 240 hour(s))  SARS Coronavirus 2 (CEPHEID - Performed in [facility] hospital lab), Hosp Order     Status: None   Collection Time: 07/13/18 11:35 AM   Specimen: Nasopharyngeal Swab  Result Value Ref Range Status   SARS Coronavirus 2 NEGATIVE NEGATIVE Final    Comment: (NOTE) If result is NEGATIVE SARS-CoV-2 target nucleic acids are NOT DETECTED. The SARS-CoV-2 RNA is generally detectable in upper and lower  respiratory specimens during the acute phase of infection. The lowest  concentration of SARS-CoV-2 viral copies this assay can detect is 250  copies / mL. A negative result does not preclude SARS-CoV-2 infection  and should not be used as the sole basis for treatment or other  patient management decisions.  A negative result may occur with  improper specimen collection / handling, submission of specimen other  than nasopharyngeal swab, presence of viral mutation(s) within the  areas targeted by this assay, and inadequate number of viral copies  (<250 copies / mL). A negative result must be combined with clinical  observations, patient history, and epidemiological information. If result is POSITIVE SARS-CoV-2 target nucleic acids are DETECTED. The SARS-CoV-2 RNA is generally detectable in upper and lower  respiratory specimens dur ing the acute phase of infection.  Positive  results are indicative of active infection with SARS-CoV-2.  Clinical  correlation with patient history and other  diagnostic information is  necessary to determine patient infection status.  Positive results do  not rule out bacterial infection or co-infection with other viruses. If result is PRESUMPTIVE POSTIVE SARS-CoV-2 nucleic acids MAY BE PRESENT.   A presumptive positive result was obtained on the submitted specimen  and confirmed on repeat testing.  While 2019 novel coronavirus  (SARS-CoV-2) nucleic acids may be present in the submitted sample  additional confirmatory testing may be necessary for epidemiological  and / or clinical management purposes  to differentiate between  SARS-CoV-2 and other Sarbecovirus currently known to infect humans.  If clinically indicated additional testing with an alternate test  methodology [phone]) is advised. The SARS-CoV-2 RNA is generally  detectable in upper and lower respiratory sp ecimens during the acute  phase of infection. The expected result is Negative. Fact Sheet for Patients:  StrictlyIdeas.no Fact Sheet for Healthcare Providers: BankingDealers.co.za This test is not yet approved or cleared by the [place] FDA and has been authorized for detection and/or diagnosis of SARS-CoV-2 by FDA under an Emergency Use Authorization (EUA).  This EUA will remain in effect (meaning this test can be used) for the duration of the COVID-19 declaration under Section 564(b)(1) of the Act, 21 U.S.C. section 360bbb-3(b)(1), unless the authorization is terminated or revoked sooner. Performed at [facility], [name] [address] [place], [place] [address]      Radiological Exams on Admission: Ct Head Wo Contrast  Result Date: 07/13/2018 CLINICAL DATA:  Altered level of consciousness. EXAM: CT HEAD WITHOUT CONTRAST TECHNIQUE: Contiguous axial images were obtained from the base of the skull through the vertex without intravenous contrast. COMPARISON:  05/24/2018 FINDINGS: Brain: Moderate atrophy and moderate  chronic microvascular ischemic changes in the white matter similar to the prior study. Negative for acute infarct, hemorrhage, mass. Vascular: Negative for hyperdense vessel Skull: Negative skull.  Poor dentition. Sinuses/Orbits: Negative Other: None IMPRESSION: No acute abnormality.  Moderate atrophy and chronic ischemic change. Electronically Signed   By: Franchot Gallo M.D.   On: 07/13/2018 13:36   Dg Chest Portable 1 View  Result Date: 07/13/2018 CLINICAL DATA:  Altered mental status EXAM: PORTABLE CHEST 1 VIEW COMPARISON:  05/24/2018 FINDINGS: Cardiac shadow is within normal limits. Aortic calcifications are again identified. Elevation of the right hemidiaphragm is seen. Minimal blunting of the costophrenic angle on the right is noted likely related to the elevated hemidiaphragm. No focal infiltrate or effusion is seen. No acute bony abnormality is noted. IMPRESSION: No acute abnormality noted. Electronically Signed   By: Inez Catalina M.D.   On: 07/13/2018 11:58    EKG: Independently reviewed.  Sinus rhythm, rate 64  Assessment/Plan  Acute encephalopathy -Unknown etiology -Patient with advanced dementia at baseline-suspect this is likely secondary to  progression of his dementia -Chest x-ray unremarkable for infection -CT head no acute intracranial abnormality -COVID negative -Pending UA, blood cultures -Patient was given cefepime, Flagyl, vancomycin in the emergency department as he was found to have an elevated lactic acid which is now normalized -Continue IV fluids -Neurochecks -Will place on telemetry, will obtain orthostatic vitals, TSH, vitamin B12, vitamin B1, folate, EEG -Currently no metabolic derangements -Patient had recent admission in May 2020 with acute encephalopathy.  However at that time he had sepsis with a rash.  Diabetes mellitus, type II -Likely diet controlled as patient is on any home medications -On insulin sliding scale with CBG monitoring  Sacral pressure  ulcer -Present on admission -Wound care consulted  History of hypertension -BP was soft upon able to the ED, however now has stabilized -Currently on no home medications -Monitor and add IV medications as needed  DVT prophylaxis: lovenox  Code Status: Full  Family Communication: None at bedside. Admission, patients condition and plan of care including tests being ordered have been discussed with the patient and who indicate understanding and agree with the plan and Code Status. Attempted to reach daughter.  Disposition Plan: home   Consults called: None   Admission status: Observation.   Time spent: 70 minutes  Curtis Brock D.O. [facility]  Between 7am to 7pm - Please see pager noted on [email or web address]  After 7pm go to [email or web address] 07/13/2018, 6:05 PM

## 2018-07-13 NOTE — Progress Notes (Signed)
Pharmacy Antibiotic Note  Curtis Brock. is a 85 y.o. male admitted on 07/13/2018 with sepsis.  Pharmacy has been consulted for vancomycin/cefepime dosing. Afebrile, WBC wnl, LA 2.7. SCr pending.  Plan: Cefepime 2g IV x 1 Vancomycin 1500mg  IV x1 Flagyl 500mg  IV x 1 per EDP - f/u if needs to continue Monitor clinical progress, c/s, renal function F/u de-escalation plan/LOT, vancomycin levels as indicated F/u SCr on admit to enter antibiotic maintenance doses      Temp (24hrs), Avg:97.2 F (36.2 C), Min:97.2 F (36.2 C), Max:97.2 F (36.2 C)  Recent Labs  Lab 07/13/18 1135  WBC 7.9  LATICACIDVEN 2.7*    CrCl cannot be calculated (Patient's most recent lab result is older than the maximum 21 days allowed.).    No Known Allergies  Antimicrobials this admission: 6/29 vancomycin >>  6/29 cefepime >>  6/29 Flagyl x 1  Dose adjustments this admission:   Microbiology results:   Elicia Lamp, PharmD, BCPS Clinical Pharmacist 07/13/2018 12:48 PM

## 2018-07-13 NOTE — ED Notes (Signed)
ED TO INPATIENT HANDOFF REPORT  ED Nurse Name and Phone #: [phone]  S Name/Age/Gender Curtis Brock. 85 y.o. male Room/Bed: TRACC/TRACC  Code Status   Code Status: Full Code  Home/SNF/Other Home Patient oriented to: self and time Is this baseline? Yes   Triage Complete: Triage complete  Chief Complaint Unresponsive  Triage Note Pt arrives via EMS from doctor office where they reported pt went unresponsive. Pt has pressure ulcer to bottom, blind. Pt responds to name on exam.    Allergies No Known Allergies  Level of Care/Admitting Diagnosis ED Disposition    ED Disposition Condition [place]: [place] [[id number]]  Level of Care: Telemetry Medical [104]  I expect the patient will be discharged within 24 hours: Yes  LOW acuity---Tx typically complete <24 hrs---ACUTE conditions typically can be evaluated <24 hours---LABS likely to return to acceptable levels <24 hours---IS near functional baseline---EXPECTED to return to current living arrangement---NOT newly hypoxic: Meets criteria for 5C-Observation unit  Covid Evaluation: Confirmed COVID Negative  Diagnosis: Acute encephalopathy [664403]  Admitting Physician: Cristal Ford [phone]  Attending Physician: Cristal Ford [phone]  PT Class (Do Not Modify): Observation [104]  PT Acc Code (Do Not Modify): Observation [10022]       B Medical/Surgery History Past Medical History:  Diagnosis Date  . Blind left eye   . Diabetes mellitus without complication ([place])   . Hyperlipemia   . Stroke [facility])    Past Surgical History:  Procedure Laterality Date  . ABDOMINAL AORTOGRAM W/LOWER EXTREMITY N/A 10/02/2016   Procedure: ABDOMINAL AORTOGRAM W/LOWER EXTREMITY;  Surgeon: Waynetta Sandy, MD;  Location: [facility];  Service: Cardiovascular;  Laterality: N/A;  . PERIPHERAL VASCULAR BALLOON ANGIOPLASTY Left 10/02/2016   Procedure: PERIPHERAL VASCULAR  BALLOON ANGIOPLASTY;  Surgeon: Waynetta Sandy, MD;  Location: [place] [facility];  Service: Cardiovascular;  Laterality: Left;     A IV Location/Drains/Wounds Patient Lines/Drains/Airways Status   Active Line/Drains/Airways    Name:   Placement date:   Placement time:   Site:   Days:   Peripheral IV 07/13/18 Left Wrist   07/13/18    1149    Wrist   less than 1   Peripheral IV 07/13/18 Right Wrist   07/13/18    1149    Wrist   less than 1   Midline Single Lumen 05/26/18 Midline Right Brachial 8 cm 0 cm   05/26/18    1156    Brachial   48   External Urinary Catheter   05/24/18    1523    -   50   Pressure Injury 05/25/18 Stage II -  Partial thickness loss of dermis presenting as a shallow open ulcer with a red, pink wound bed without slough.   05/25/18    -     49   Wound / Incision (Open or Dehisced) 05/25/18 Arm Lower;Posterior;Right;Distal blisters   05/25/18    -    Arm   49   Wound / Incision (Open or Dehisced) 06/02/18 Hand Posterior;Right fluid filled blister   06/02/18    2040    Hand   41          Intake/Output Last 24 hours  Intake/Output Summary (Last 24 hours) at 07/13/2018 1835 Last data filed at 07/13/2018 1537 Gross per 24 hour  Intake 2100 ml  Output -  Net 2100 ml    Labs/Imaging Results for orders placed or  performed during the hospital encounter of 07/13/18 (from the past 48 hour(s))  Lactic acid, plasma     Status: Abnormal   Collection Time: 07/13/18 11:35 AM  Result Value Ref Range   Lactic Acid, Venous 2.7 (HH) 0.5 - 1.9 mmol/L    Comment: CRITICAL RESULT CALLED TO, READ BACK BY AND VERIFIED WITH: L.MEEKS,RN 07/13/2018 1239 DAVISB Performed at [facility] [address] [place], [place] [address]   CBC with Differential     Status: Abnormal   Collection Time: 07/13/18 11:35 AM  Result Value Ref Range   WBC 7.9 4.0 - 10.5 K/uL   RBC 3.85 (L) 4.22 - 5.81 MIL/uL   Hemoglobin 10.3 (L) 13.0 - 17.0 g/dL   HCT 33.7 (L) 39.0 - 52.0 %   MCV  87.5 80.0 - 100.0 fL   MCH 26.8 26.0 - 34.0 pg   MCHC 30.6 30.0 - 36.0 g/dL   RDW 15.6 (H) 11.5 - 15.5 %   Platelets 420 (H) 150 - 400 K/uL   nRBC 0.0 0.0 - 0.2 %   Neutrophils Relative % 71 %   Neutro Abs 5.6 1.7 - 7.7 K/uL   Lymphocytes Relative 19 %   Lymphs Abs 1.5 0.7 - 4.0 K/uL   Monocytes Relative 7 %   Monocytes Absolute 0.5 0.1 - 1.0 K/uL   Eosinophils Relative 2 %   Eosinophils Absolute 0.2 0.0 - 0.5 K/uL   Basophils Relative 0 %   Basophils Absolute 0.0 0.0 - 0.1 K/uL   Immature Granulocytes 1 %   Abs Immature Granulocytes 0.07 0.00 - 0.07 K/uL    Comment: Performed at [facility] [address], [place], [place] [address]  SARS Coronavirus 2 (CEPHEID - Performed in [place] hospital lab), Hosp Order     Status: None   Collection Time: 07/13/18 11:35 AM   Specimen: Nasopharyngeal Swab  Result Value Ref Range   SARS Coronavirus 2 NEGATIVE NEGATIVE    Comment: (NOTE) If result is NEGATIVE SARS-CoV-2 target nucleic acids are NOT DETECTED. The SARS-CoV-2 RNA is generally detectable in upper and lower  respiratory specimens during the acute phase of infection. The lowest  concentration of SARS-CoV-2 viral copies this assay can detect is 250  copies / mL. A negative result does not preclude SARS-CoV-2 infection  and should not be used as the sole basis for treatment or other  patient management decisions.  A negative result may occur with  improper specimen collection / handling, submission of specimen other  than nasopharyngeal swab, presence of viral mutation(s) within the  areas targeted by this assay, and inadequate number of viral copies  (<250 copies / mL). A negative result must be combined with clinical  observations, patient history, and epidemiological information. If result is POSITIVE SARS-CoV-2 target nucleic acids are DETECTED. The SARS-CoV-2 RNA is generally detectable in upper and lower  respiratory specimens dur ing the acute phase of  infection.  Positive  results are indicative of active infection with SARS-CoV-2.  Clinical  correlation with patient history and other diagnostic information is  necessary to determine patient infection status.  Positive results do  not rule out bacterial infection or co-infection with other viruses. If result is PRESUMPTIVE POSTIVE SARS-CoV-2 nucleic acids MAY BE PRESENT.   A presumptive positive result was obtained on the submitted specimen  and confirmed on repeat testing.  While 2019 novel coronavirus  (SARS-CoV-2) nucleic acids may be present in the submitted sample  additional confirmatory  testing may be necessary for epidemiological  and / or clinical management purposes  to differentiate between  SARS-CoV-2 and other Sarbecovirus currently known to infect humans.  If clinically indicated additional testing with an alternate test  methodology [phone]) is advised. The SARS-CoV-2 RNA is generally  detectable in upper and lower respiratory sp ecimens during the acute  phase of infection. The expected result is Negative. Fact Sheet for Patients:  StrictlyIdeas.no Fact Sheet for Healthcare Providers: BankingDealers.co.za This test is not yet approved or cleared by the [place] FDA and has been authorized for detection and/or diagnosis of SARS-CoV-2 by FDA under an Emergency Use Authorization (EUA).  This EUA will remain in effect (meaning this test can be used) for the duration of the COVID-19 declaration under Section 564(b)(1) of the Act, 21 U.S.C. section 360bbb-3(b)(1), unless the authorization is terminated or revoked sooner. Performed at [facility], [place] [address], [place], [place] [address]   Lactic acid, plasma     Status: None   Collection Time: 07/13/18  2:04 PM  Result Value Ref Range   Lactic Acid, Venous 1.3 0.5 - 1.9 mmol/L    Comment: Performed at [facility] [address] [place], [place]  [address]  Comprehensive metabolic panel     Status: Abnormal   Collection Time: 07/13/18  3:00 PM  Result Value Ref Range   Sodium 140 135 - 145 mmol/L   Potassium 4.5 3.5 - 5.1 mmol/L   Chloride 108 98 - 111 mmol/L   CO2 24 22 - 32 mmol/L   Glucose, Bld 144 (H) 70 - 99 mg/dL   BUN 13 8 - 23 mg/dL   Creatinine, Ser 0.75 0.61 - 1.24 mg/dL   Calcium 8.2 (L) 8.9 - 10.3 mg/dL   Total Protein 6.1 (L) 6.5 - 8.1 g/dL   Albumin 2.0 (L) 3.5 - 5.0 g/dL   AST 17 15 - 41 U/L   ALT 8 0 - 44 U/L   Alkaline Phosphatase 72 38 - 126 U/L   Total Bilirubin 0.5 0.3 - 1.2 mg/dL   GFR calc non Af Amer >60 >60 mL/min   GFR calc Af Amer >60 >60 mL/min   Anion gap 8 5 - 15    Comment: Performed at [facility] [address], [place], [place] [address]  Lipase, blood     Status: None   Collection Time: 07/13/18  3:00 PM  Result Value Ref Range   Lipase 22 11 - 51 U/L    Comment: Performed at [facility] [address] [place], [place] [address]  Troponin I (High Sensitivity)     Status: None   Collection Time: 07/13/18  3:00 PM  Result Value Ref Range   Troponin I (High Sensitivity) 9 <18 ng/L    Comment: (NOTE) Elevated high sensitivity troponin I (hsTnI) values and significant  changes across serial measurements may suggest ACS but many other  chronic and acute conditions are known to elevate hsTnI results.  Refer to the "Links" section for chest pain algorithms and additional  guidance. Performed at [facility], [place] [address] [place], [place] [address]    Ct Head Wo Contrast  Result Date: 07/13/2018 CLINICAL DATA:  Altered level of consciousness. EXAM: CT HEAD WITHOUT CONTRAST TECHNIQUE: Contiguous axial images were obtained from the base of the skull through the vertex without intravenous contrast. COMPARISON:  05/24/2018 FINDINGS: Brain: Moderate atrophy and moderate chronic microvascular ischemic changes in  the white matter similar to the prior study. Negative for acute  infarct, hemorrhage, mass. Vascular: Negative for hyperdense vessel Skull: Negative skull.  Poor dentition. Sinuses/Orbits: Negative Other: None IMPRESSION: No acute abnormality.  Moderate atrophy and chronic ischemic change. Electronically Signed   By: Franchot Gallo M.D.   On: 07/13/2018 13:36   Dg Chest Portable 1 View  Result Date: 07/13/2018 CLINICAL DATA:  Altered mental status EXAM: PORTABLE CHEST 1 VIEW COMPARISON:  05/24/2018 FINDINGS: Cardiac shadow is within normal limits. Aortic calcifications are again identified. Elevation of the right hemidiaphragm is seen. Minimal blunting of the costophrenic angle on the right is noted likely related to the elevated hemidiaphragm. No focal infiltrate or effusion is seen. No acute bony abnormality is noted. IMPRESSION: No acute abnormality noted. Electronically Signed   By: Inez Catalina M.D.   On: 07/13/2018 11:58    Pending Labs Unresulted Labs (From admission, onward)    Start     Ordered   07/20/18 0500  Creatinine, serum  (enoxaparin (LOVENOX)    CrCl >/= 30 ml/min)  Weekly,   R    Comments: while on enoxaparin therapy    07/13/18 1816   07/14/18 2330  Basic metabolic panel  Tomorrow morning,   R     07/13/18 1816   07/14/18 0500  CBC  Tomorrow morning,   R     07/13/18 1816   07/13/18 1817  Urinalysis, Routine w reflex microscopic  Once,   STAT     07/13/18 1816   07/13/18 1817  TSH  Once,   STAT     07/13/18 1816   07/13/18 1817  Vitamin B12  Once,   STAT     07/13/18 1816   07/13/18 1817  Vitamin B1  Once,   STAT     07/13/18 1816   07/13/18 1817  Folate RBC  Once,   STAT     07/13/18 1816   07/13/18 1817  TSH  Once,   STAT     07/13/18 1816   07/13/18 1124  Urinalysis, Routine w reflex microscopic  ONCE - STAT,   STAT     07/13/18 1124   07/13/18 1124  Urine culture  ONCE - STAT,   STAT     07/13/18 1124   07/13/18 1123  Culture, blood (routine x 2)  BLOOD CULTURE X 2,   STAT     07/13/18 1124           Vitals/Pain Today's Vitals   07/13/18 1430 07/13/18 1715 07/13/18 1745 07/13/18 1800  BP: (!) 146/77 136/78 (!) 142/69 (!) 142/69  Pulse: 83 84 80 78  Resp: 19 13 13  (!) 8  Temp:      TempSrc:      SpO2: 100% 99% 100% 100%    Isolation Precautions No active isolations  Medications Medications  acetaminophen (TYLENOL) tablet 650 mg (has no administration in time range)  aspirin chewable tablet 81 mg (has no administration in time range)  ferrous sulfate tablet 325 mg (has no administration in time range)  Resource ThickenUp Clear 1 g (has no administration in time range)  Geritol LIQD 5 mL (has no administration in time range)  multivitamin with minerals tablet 1 tablet (has no administration in time range)  brimonidine (ALPHAGAN) 0.2 % ophthalmic solution 1 drop (has no administration in time range)  timolol (TIMOPTIC) 0.5 % ophthalmic solution 1 drop (has no administration in time range)  hydrocortisone cream 1 % 1 application (has  no administration in time range)  enoxaparin (LOVENOX) injection 40 mg (has no administration in time range)  0.9 %  sodium chloride infusion (has no administration in time range)  ondansetron (ZOFRAN) tablet 4 mg (has no administration in time range)    Or  ondansetron (ZOFRAN) injection 4 mg (has no administration in time range)  insulin aspart (novoLOG) injection 0-9 Units (has no administration in time range)  sodium chloride 0.9 % bolus 1,000 mL (0 mLs Intravenous Stopped 07/13/18 1400)  ceFEPIme (MAXIPIME) 2 g in sodium chloride 0.9 % 100 mL IVPB (0 g Intravenous Stopped 07/13/18 1402)  metroNIDAZOLE (FLAGYL) IVPB 500 mg (0 mg Intravenous Stopped 07/13/18 1511)  vancomycin (VANCOCIN) 1,500 mg in sodium chloride 0.9 % 500 mL IVPB (0 mg Intravenous Stopped 07/13/18 1537)    Mobility non-ambulatory High fall risk   Focused Assessments Neuro Assessment Handoff:  Swallow screen pass? Yes          Neuro Assessment:   Neuro Checks:       Last Documented NIHSS Modified Score:   Has TPA been given? No If patient is a Neuro Trauma and patient is going to OR before floor call report to [facility] nurse: [phone] or [phone]     R Recommendations: See Admitting Provider Note  Report given to:   Additional Notes:

## 2018-07-13 NOTE — ED Notes (Signed)
Unsuccessful in & out catheter due to resistance met half way into insertion and small amount of blood.

## 2018-07-14 ENCOUNTER — Other Ambulatory Visit: Payer: Self-pay

## 2018-07-14 ENCOUNTER — Observation Stay (HOSPITAL_COMMUNITY): Payer: Medicare HMO

## 2018-07-14 ENCOUNTER — Encounter (HOSPITAL_COMMUNITY): Payer: Self-pay | Admitting: Radiology

## 2018-07-14 DIAGNOSIS — I10 Essential (primary) hypertension: Secondary | ICD-10-CM | POA: Diagnosis not present

## 2018-07-14 DIAGNOSIS — G9340 Encephalopathy, unspecified: Secondary | ICD-10-CM

## 2018-07-14 DIAGNOSIS — R4182 Altered mental status, unspecified: Secondary | ICD-10-CM | POA: Diagnosis not present

## 2018-07-14 DIAGNOSIS — E119 Type 2 diabetes mellitus without complications: Secondary | ICD-10-CM | POA: Diagnosis not present

## 2018-07-14 DIAGNOSIS — F0390 Unspecified dementia without behavioral disturbance: Secondary | ICD-10-CM | POA: Diagnosis not present

## 2018-07-14 LAB — CBC
HCT: 33.2 % — ABNORMAL LOW (ref 39.0–52.0)
Hemoglobin: 10.4 g/dL — ABNORMAL LOW (ref 13.0–17.0)
MCH: 26.3 pg (ref 26.0–34.0)
MCHC: 31.3 g/dL (ref 30.0–36.0)
MCV: 84.1 fL (ref 80.0–100.0)
Platelets: 429 10*3/uL — ABNORMAL HIGH (ref 150–400)
RBC: 3.95 MIL/uL — ABNORMAL LOW (ref 4.22–5.81)
RDW: 15.1 % (ref 11.5–15.5)
WBC: 7.5 10*3/uL (ref 4.0–10.5)
nRBC: 0 % (ref 0.0–0.2)

## 2018-07-14 LAB — BASIC METABOLIC PANEL
Anion gap: 7 (ref 5–15)
BUN: 13 mg/dL (ref 8–23)
CO2: 24 mmol/L (ref 22–32)
Calcium: 8.5 mg/dL — ABNORMAL LOW (ref 8.9–10.3)
Chloride: 108 mmol/L (ref 98–111)
Creatinine, Ser: 0.61 mg/dL (ref 0.61–1.24)
GFR calc Af Amer: >60 mL/min (ref >60)
GFR calc non Af Amer: >60 mL/min (ref >60)
Glucose, Bld: 110 mg/dL — ABNORMAL HIGH (ref 70–99)
Potassium: 4 mmol/L (ref 3.5–5.1)
Sodium: 139 mmol/L (ref 135–145)

## 2018-07-14 LAB — GLUCOSE, CAPILLARY
Glucose-Capillary: 128 mg/dL — ABNORMAL HIGH (ref 70–99)
Glucose-Capillary: 181 mg/dL — ABNORMAL HIGH (ref 70–99)

## 2018-07-14 LAB — BLOOD CULTURE ID PANEL (REFLEXED)

## 2018-07-14 LAB — URINE CULTURE: Culture: NO GROWTH

## 2018-07-14 LAB — MRSA PCR SCREENING: MRSA by PCR: POSITIVE — AB

## 2018-07-14 LAB — FOLATE: Folate: 5.8 ng/mL — ABNORMAL LOW (ref >5.9)

## 2018-07-14 MED ORDER — VANCOMYCIN HCL 10 G IV SOLR
1250.0000 mg | INTRAVENOUS | Status: DC
  Filled 2018-07-14: qty 1250

## 2018-07-14 MED ORDER — COLLAGENASE 250 UNIT/GM EX OINT
TOPICAL_OINTMENT | Freq: Every day | CUTANEOUS | Status: DC
  Administered 2018-07-14: 12:00:00 via TOPICAL
  Filled 2018-07-14: qty 30

## 2018-07-14 NOTE — Discharge Summary (Signed)
Physician Discharge Summary  Curtis Brock. YKZ:[id number] DOB: 1925/08/16 DOA: 07/13/2018  PCP: Caren Macadam, MD  Admit date: 07/13/2018 Discharge date: 07/14/2018  Time spent: 45 minutes  Recommendations for Outpatient Follow-up:  Patient will be discharged to home.  Patient will need to follow up with primary care provider within one week of discharge.  Patient should continue medications as prescribed.  Patient should follow a heart healthy/carb modified diet.   Discharge Diagnoses:  Acute encephalopathy Diabetes mellitus, type II Sacral pressure ulcer History of hypertension  Discharge Condition: Stable  Diet recommendation: heart healthy/carb modified  Filed Weights   07/13/18 1943  Weight: 65.1 kg    History of present illness:  Curtis Brock. is a 85 y.o. male with a medical history of left eye blindness, diabetes mellitus, dementia, who presented to the emergency department with altered mental status.  Information detailed in this history and physical were obtained from the ED note as no family was available.  Patient currently only alert to himself.  It seems that patient was at a wound clinic appointment today and suddenly became unresponsive.  He was accompanied with his daughter.  EMS reported that they found him to be cool, clammy, with blood sugar of 150s upon arrival.  Blood pressure was 939 systolically.  Currently patient is awake and alert, only to self.  He has no complaints of shortness of breath, chest pain, abdominal pain, nausea or vomiting, diarrhea or constipation, dizziness or headache, with urination, recent illness or ill contacts.  Hospital Course:  Acute encephalopathy -Unknown etiology -Patient with advanced dementia at baseline-suspect this is likely secondary to progression of his dementia -Chest x-ray unremarkable for infection -CT head no acute intracranial abnormality -COVID negative -UA: rare bacteria, large leukocytes, 11-20 WBC   -blood cultures show no growth to date -Patient was given cefepime, Flagyl, vancomycin in the emergency department as he was found to have an elevated lactic acid which is now normalized -was placed on IV fluid -TSH 2.387, Vitamin b12 2.387, B1 and folate pending  -EEG: This is a technically difficult recording due to the prominence of muscle and movement artifact.  Posterior background rhythm appears to be slow, consistent with dementia.  No epileptiform activity is noted.   -MRI brain: motion degraded examination without evidence of acute intracranial abnormality   -Currently no metabolic derangements  Diabetes mellitus, type II -Likely diet controlled as patient is on any home medications  Sacral pressure ulcer -Present on admission -Wound care consulted: Cleanse sacral wound with NS and pat dry.  Apply Santyl to wound bed. Cover with NS moist gauze.  Secure with ABD pad and tape.  Change daily.   History of hypertension -BP was soft upon able to the ED, however now has stabilized -Currently on no home medications  Procedures: EEG  Consultations: None  Discharge Exam: Vitals:   07/14/18 1130 07/14/18 1620  BP: (!) 113/47 (!) 146/86  Pulse: 99 89  Resp: (!) 23 13  Temp: 98.4 F (36.9 C) 98.4 F (36.9 C)  SpO2: 100% 100%     General: Well developed, chronically ill appearing, elderly, NAD  HEENT: NCAT, mucous membranes moist.  Cardiovascular: S1 S2 auscultated, RRR  Respiratory: Clear to auscultation bilaterally  Abdomen: Soft, nontender, nondistended, + bowel sounds  Extremities: warm dry without cyanosis clubbing or edema  Neuro: AAOx1 self only, dementia.   Discharge Instructions Discharge Instructions    Discharge instructions   Complete by: As directed    Patient  will be discharged to home.  Patient will need to follow up with primary care provider within one week of discharge.  Patient should continue medications as prescribed.  Patient should follow  a heart healthy/carb modified diet.     Allergies as of 07/14/2018   No Known Allergies     Medication List    TAKE these medications   acetaminophen 325 MG tablet Commonly known as: TYLENOL Take 650 mg by mouth daily.   aspirin 81 MG chewable tablet Chew 81 mg by mouth daily.   brimonidine 0.2 % ophthalmic solution Commonly known as: ALPHAGAN Place 1 drop into the right eye 2 (two) times daily.   ferrous sulfate 325 (65 FE) MG tablet Take 325 mg by mouth daily with breakfast.   multivitamin with minerals Tabs tablet Take 1 tablet by mouth daily.   Resource ThickenUp Clear Powd Take 1 g by mouth as needed. What changed: additional instructions   timolol 0.5 % ophthalmic solution Commonly known as: TIMOPTIC Place 1 drop into the right eye daily.            Durable Medical Equipment  (From admission, onward)         Start     Ordered   07/14/18 1655  For home use only DME lightweight manual wheelchair with seat cushion  Once    Comments: Patient suffers from weakness which impairs their ability to perform daily activities like dressing and bathing in the home.  A walker will not resolve  issue with performing activities of daily living. A wheelchair will allow patient to safely perform daily activities. Patient is not able to propel themselves in the home using a standard weight wheelchair due to weakness. Patient can self propel in the lightweight wheelchair. Length of need lifetime. Accessories: elevating leg rests (ELRs), wheel locks, extensions and anti-tippers. Needs to have a high back and needs to be able to recline back.   07/14/18 1657         No Known Allergies Follow-up Information    [facility], [facility]. Schedule an appointment as soon as possible for a visit in 1 week(s).   Specialty: Family Medicine Why: Hospital follow up Contact information: [place]  [phone]            The results of  significant diagnostics from this hospitalization (including imaging, microbiology, ancillary and laboratory) are listed below for reference.    Significant Diagnostic Studies: Ct Head Wo Contrast  Result Date: 07/13/2018 CLINICAL DATA:  Altered level of consciousness. EXAM: CT HEAD WITHOUT CONTRAST TECHNIQUE: Contiguous axial images were obtained from the base of the skull through the vertex without intravenous contrast. COMPARISON:  05/24/2018 FINDINGS: Brain: Moderate atrophy and moderate chronic microvascular ischemic changes in the white matter similar to the prior study. Negative for acute infarct, hemorrhage, mass. Vascular: Negative for hyperdense vessel Skull: Negative skull.  Poor dentition. Sinuses/Orbits: Negative Other: None IMPRESSION: No acute abnormality.  Moderate atrophy and chronic ischemic change. Electronically Signed   By: Franchot Gallo M.D.   On: 07/13/2018 13:36   Mr Brain Wo Contrast  Result Date: 07/14/2018 CLINICAL DATA:  Encephalopathy. EXAM: MRI HEAD WITHOUT CONTRAST TECHNIQUE: Multiplanar, multiecho pulse sequences of the brain and surrounding structures were obtained without intravenous contrast. COMPARISON:  Head CT 07/13/2018 and MRI 10/16/2016 FINDINGS: Multiple sequences are moderately motion degraded. Brain: There is no evidence of acute infarct, mass, midline shift, or extra-axial fluid collection. A chronic  microhemorrhage in the right cerebellum is new from the prior MRI and nonspecific in isolation. Small chronic infarcts are again seen in the cerebellum, right thalamus, right internal capsule, and bilateral deep cerebral white matter. Additional confluent T2 hyperintensity in the cerebral white matter bilaterally is similar to the prior MRI and nonspecific but compatible with severe chronic small vessel ischemic disease. Advanced cerebral atrophy is again noted. Vascular: Major intracranial vascular flow voids are preserved. Skull and upper cervical spine: No  suspicious marrow lesion. Sinuses/Orbits: Bilateral cataract extraction. Clear paranasal sinuses. Small mastoid effusions. Other: None. IMPRESSION: 1. Motion degraded examination without evidence of acute intracranial abnormality. 2. Advanced chronic small vessel ischemic disease and cerebral atrophy. Electronically Signed   By: Logan Bores M.D.   On: 07/14/2018 16:34   Dg Chest Portable 1 View  Result Date: 07/13/2018 CLINICAL DATA:  Altered mental status EXAM: PORTABLE CHEST 1 VIEW COMPARISON:  05/24/2018 FINDINGS: Cardiac shadow is within normal limits. Aortic calcifications are again identified. Elevation of the right hemidiaphragm is seen. Minimal blunting of the costophrenic angle on the right is noted likely related to the elevated hemidiaphragm. No focal infiltrate or effusion is seen. No acute bony abnormality is noted. IMPRESSION: No acute abnormality noted. Electronically Signed   By: Inez Catalina M.D.   On: 07/13/2018 11:58    Microbiology: Recent Results (from the past 240 hour(s))  SARS Coronavirus 2 (CEPHEID - Performed in [place] hospital lab), Hosp Order     Status: None   Collection Time: 07/13/18 11:35 AM   Specimen: Nasopharyngeal Swab  Result Value Ref Range Status   SARS Coronavirus 2 NEGATIVE NEGATIVE Final    Comment: (NOTE) If result is NEGATIVE SARS-CoV-2 target nucleic acids are NOT DETECTED. The SARS-CoV-2 RNA is generally detectable in upper and lower  respiratory specimens during the acute phase of infection. The lowest  concentration of SARS-CoV-2 viral copies this assay can detect is 250  copies / mL. A negative result does not preclude SARS-CoV-2 infection  and should not be used as the sole basis for treatment or other  patient management decisions.  A negative result may occur with  improper specimen collection / handling, submission of specimen other  than nasopharyngeal swab, presence of viral mutation(s) within the  areas targeted by this assay, and  inadequate number of viral copies  (<250 copies / mL). A negative result must be combined with clinical  observations, patient history, and epidemiological information. If result is POSITIVE SARS-CoV-2 target nucleic acids are DETECTED. The SARS-CoV-2 RNA is generally detectable in upper and lower  respiratory specimens dur ing the acute phase of infection.  Positive  results are indicative of active infection with SARS-CoV-2.  Clinical  correlation with patient history and other diagnostic information is  necessary to determine patient infection status.  Positive results do  not rule out bacterial infection or co-infection with other viruses. If result is PRESUMPTIVE POSTIVE SARS-CoV-2 nucleic acids MAY BE PRESENT.   A presumptive positive result was obtained on the submitted specimen  and confirmed on repeat testing.  While 2019 novel coronavirus  (SARS-CoV-2) nucleic acids may be present in the submitted sample  additional confirmatory testing may be necessary for epidemiological  and / or clinical management purposes  to differentiate between  SARS-CoV-2 and other Sarbecovirus currently known to infect humans.  If clinically indicated additional testing with an alternate test  methodology [phone]) is advised. The SARS-CoV-2 RNA is generally  detectable in upper and lower respiratory sp  ecimens during the acute  phase of infection. The expected result is Negative. Fact Sheet for Patients:  StrictlyIdeas.no Fact Sheet for Healthcare Providers: BankingDealers.co.za This test is not yet approved or cleared by the [place] FDA and has been authorized for detection and/or diagnosis of SARS-CoV-2 by FDA under an Emergency Use Authorization (EUA).  This EUA will remain in effect (meaning this test can be used) for the duration of the COVID-19 declaration under Section 564(b)(1) of the Act, 21 U.S.C. section 360bbb-3(b)(1), unless the  authorization is terminated or revoked sooner. Performed at [facility], [place] [address], [place], [place] [address]   Culture, blood (routine x 2)     Status: None (Preliminary result)   Collection Time: 07/13/18 12:37 PM   Specimen: BLOOD  Result Value Ref Range Status   Specimen Description BLOOD SITE NOT SPECIFIED  Final   Special Requests   Final    BOTTLES DRAWN AEROBIC AND ANAEROBIC Blood Culture results may not be optimal due to an inadequate volume of blood received in culture bottles   Culture   Final    NO GROWTH 1 DAY Performed at [facility], [place] [address], [place], [place] [address]    Report Status PENDING  Incomplete  Urine culture     Status: None   Collection Time: 07/13/18  6:32 PM   Specimen: Urine, Random  Result Value Ref Range Status   Specimen Description URINE, RANDOM  Final   Special Requests NONE  Final   Culture   Final    NO GROWTH Performed at [facility], [place] [address] [place], [place] [address]    Report Status 07/14/2018 FINAL  Final  MRSA PCR Screening     Status: Abnormal   Collection Time: 07/14/18  3:47 AM   Specimen: Nasal Mucosa; Nasopharyngeal  Result Value Ref Range Status   MRSA by PCR POSITIVE (A) NEGATIVE Final    Comment:        The GeneXpert MRSA Assay (FDA approved for NASAL specimens only), is one component of a comprehensive MRSA colonization surveillance program. It is not intended to diagnose MRSA infection nor to guide or monitor treatment for MRSA infections. CRITICAL RESULT CALLED TO, READ BACK BY AND VERIFIED WITH: RN B WAUGH [id number] AT 48 AM BY CM Performed at [facility], [place] [address], [place], [place] [address]      Labs: Basic Metabolic Panel: Recent Labs  Lab 07/13/18 1500 07/14/18 0430  NA 140 139  K 4.5 4.0  CL 108 108  CO2 24 24  GLUCOSE 144* 110*  BUN 13 13  CREATININE 0.75 0.61  CALCIUM 8.2* 8.5*   Liver Function Tests: Recent Labs  Lab 07/13/18 1500   AST 17  ALT 8  ALKPHOS 72  BILITOT 0.5  PROT 6.1*  ALBUMIN 2.0*   Recent Labs  Lab 07/13/18 1500  LIPASE 22   No results for input(s): AMMONIA in the last 168 hours. CBC: Recent Labs  Lab 07/13/18 1135 07/14/18 0430  WBC 7.9 7.5  NEUTROABS 5.6  --   HGB 10.3* 10.4*  HCT 33.7* 33.2*  MCV 87.5 84.1  PLT 420* 429*   Cardiac Enzymes: No results for input(s): CKTOTAL, CKMB, CKMBINDEX, TROPONINI in the last 168 hours. BNP: BNP (last 3 results) No results for input(s): BNP in the last 8760 hours.  ProBNP (last 3 results) No results for input(s): PROBNP in the last 8760 hours.  CBG: Recent Labs  Lab 07/14/18 1646  GLUCAP 128*       Signed:  Tripton Ned  [facility] 07/14/2018, 5:04 PM

## 2018-07-14 NOTE — Progress Notes (Signed)
Patient being discharged home with daughter.  [facility] to transport.  IV's removed with the catheters intact. Dischage instructions given and sent with patient.

## 2018-07-14 NOTE — Progress Notes (Signed)
PHARMACY - PHYSICIAN COMMUNICATION CRITICAL VALUE ALERT - BLOOD CULTURE IDENTIFICATION (BCID)  Curtis Brock. is an 85 y.o. male who presented to [facility] on 07/13/2018 with a chief complaint of unresponsiveness.   Assessment:  60 YOM on antibiotics for r/o sepsis of unknown etiology - the patient is noted to have a sacral wound. 1 of 3 blood cultures is now growing GPC with BCID showing MRSE - likely a contaminant.   Name of physician (or Provider) Contacted:   Current antibiotics: Vancomycin + Cefepime  Changes to prescribed antibiotics recommended:  None - noted plans for discharge   Results for orders placed or performed during the hospital encounter of 07/13/18  Blood Culture ID Panel (Reflexed) (Collected: 07/13/2018 12:37 PM)  Result Value Ref Range   Enterococcus species NOT DETECTED NOT DETECTED   Listeria monocytogenes NOT DETECTED NOT DETECTED   Staphylococcus species DETECTED (A) NOT DETECTED   Staphylococcus aureus (BCID) NOT DETECTED NOT DETECTED   Methicillin resistance DETECTED (A) NOT DETECTED   Streptococcus species NOT DETECTED NOT DETECTED   Streptococcus agalactiae NOT DETECTED NOT DETECTED   Streptococcus pneumoniae NOT DETECTED NOT DETECTED   Streptococcus pyogenes NOT DETECTED NOT DETECTED   Acinetobacter baumannii NOT DETECTED NOT DETECTED   Enterobacteriaceae species NOT DETECTED NOT DETECTED   Enterobacter cloacae complex NOT DETECTED NOT DETECTED   Escherichia coli NOT DETECTED NOT DETECTED   Klebsiella oxytoca NOT DETECTED NOT DETECTED   Klebsiella pneumoniae NOT DETECTED NOT DETECTED   Proteus species NOT DETECTED NOT DETECTED   Serratia marcescens NOT DETECTED NOT DETECTED   Haemophilus influenzae NOT DETECTED NOT DETECTED   Neisseria meningitidis NOT DETECTED NOT DETECTED   Pseudomonas aeruginosa NOT DETECTED NOT DETECTED   Candida albicans NOT DETECTED NOT DETECTED   Candida glabrata NOT DETECTED NOT DETECTED   Candida krusei NOT DETECTED NOT  DETECTED   Candida parapsilosis NOT DETECTED NOT DETECTED   Candida tropicalis NOT DETECTED NOT DETECTED    Alycia Rossetti, PharmD, BCPS 8:42 PM

## 2018-07-14 NOTE — Discharge Instructions (Signed)
Dementia Dementia is a condition that affects the way the brain works. It often affects memory and thinking. There are many types of dementia. Some types get worse with time and cannot be reversed. Some types of dementia include:  Alzheimer's disease. This is the most common type.  Vascular dementia. This type may happen due to a stroke.  Lewy body dementia. This type may happen to people who have Parkinson's disease.  Frontotemporal dementia. This type is caused by damage to nerve cells in certain parts of the brain. Some people may have more than one type, and this is called mixed dementia. What are the causes? This condition is caused by damage to cells in the brain. Some causes that cannot be reversed include:  Having a condition that affects the blood vessels of the brain, such as diabetes, heart disease, or blood vessel disease.  Changes to genes. Some causes that can be reversed or slowed include:  Injury to the brain.  Certain medicines.  Infection.  Not having enough vitamin B12 in the body, or thyroid problems.  A tumor or blood clot in the brain. What are the signs or symptoms? Symptoms depend on the type of dementia. This may include:  Problems remembering things.  Having trouble taking a bath or putting clothes on.  Forgetting appointments.  Forgetting to pay bills.  Trouble planning and making meals.  Having trouble speaking.  Getting lost easily. How is this treated? Treatment depends on the cause of the dementia. It might include taking medicines that help:  To control the dementia.  To slow down the dementia.  To manage symptoms. In some cases, treating the cause of your dementia can improve symptoms, reverse symptoms, or slow down how quickly it gets worse. Your doctor can help you find support groups and other doctors who can help with your care. Follow these instructions at home: Medicines  Take over-the-counter and prescription medicines  only as told by your doctor.  Use a pill organizer to help you manage your medicines.  Avoidtaking medicines for pain or for sleep. Lifestyle  Make healthy choices: ? Be active as told by your doctor. ? Do not use any products that contain nicotine or tobacco, such as cigarettes, e-cigarettes, and chewing tobacco. If you need help quitting, ask your doctor. ? Do not drink alcohol. ? When you get stressed, do something that will help you to relax. Your doctor can give you tips. ? Spend time with other people.  Make sure you get good sleep. To get good sleep: ? Try not to take naps during the day. ? Keep your bedroom dark and cool. ? In the few hours before you go to bed, try not to do any exercise. ? Do not have foods and drinks with caffeine at night. Eating and drinking  Drink enough fluid to keep your pee (urine) pale yellow.  Eat a healthy diet. General instructions   Talk with your doctor to figure out: ? What you need help with. ? What your safety needs are.  Ask your doctor if it is safe for you to drive.  If told, wear a bracelet that tracks where you are or shows that you are a person with memory loss.  Work with your family to make big decisions.  Keep all follow-up visits as told by your doctor. This is important. Contact a doctor if:  You have any new symptoms.  Your symptoms get worse.  You have problems with swallowing or choking. Get help  right away if:  You feel very sad, or feel that you want to harm yourself.  You or your family members are worried for your safety. If you ever feel like you may hurt yourself or others, or have thoughts about taking your own life, get help right away. You can go to your nearest emergency department or call:  Your local emergency services (911 in the [place]  A suicide crisis helpline, such as the [place] at 915-865-1203. This is open 24 hours a day. Summary  Dementia often affects  memory and thinking.  Some types of dementia get worse with time and cannot be reversed.  Treatment for this condition depends on the cause.  Talk with your doctor to figure out what you need help with.  Your doctor can help you find support groups and other doctors who can help with your care. This information is not intended to replace advice given to you by your health care provider. Make sure you discuss any questions you have with your health care provider. Document Released: 12/14/2007 Document Revised: 03/17/2018 Document Reviewed: 03/17/2018 Elsevier Patient Education  2020 [employer].

## 2018-07-14 NOTE — Progress Notes (Signed)
Patient suffers from weakness which impairs their ability to perform daily activities like dressing and bathing in the home. A walker will not resolve  issue with performing activities of daily living. A wheelchair will allow patient to safely perform daily activities. Patient is not able to propel themselves in the home using a standard weight wheelchair due to weakness. Patient can self propel in the lightweight wheelchair. Length of need lifetime.  Accessories: elevating leg rests (ELRs), wheel locks, extensions and anti-tippers.  Needs to have a high back and needs to be able to recline back.

## 2018-07-14 NOTE — TOC Initial Note (Signed)
Transition of Care (TOC) - Initial/Assessment Note    Patient Details  Name: Crosley Stejskal. MRN: [id number] Date of Birth: 06-Sep-1925  Transition of Care [facility]) CM/SW Contact:    Pollie Friar, RN Phone Number: 07/14/2018, 5:19 PM  Clinical Narrative:                   Expected Discharge Plan: Home/Self Care Barriers to Discharge: No Barriers Identified   Patient Goals and CMS Choice        Expected Discharge Plan and Services Expected Discharge Plan: Home/Self Care   Discharge Planning Services: CM Consult   Living arrangements for the past 2 months: Apartment Expected Discharge Date: 07/14/18               DME Arranged: Wheelchair manual DME Agency: [employer] Date DME Agency Contacted: 07/14/18   Representative spoke with at DME Agency: Zack            Prior Living Arrangements/Services Living arrangements for the past 2 months: Apartment Lives with:: Adult Children   Do you feel safe going back to the place where you live?: Yes      Need for Family Participation in Patient Care: Yes (Comment) Care giver support system in place?: Yes (comment)(daughter) Current home services: DME Criminal Activity/Legal Involvement Pertinent to Current Situation/Hospitalization: No - Comment as needed  Activities of Daily Living Home Assistive Devices/Equipment: Bedside commode/3-in-1, Wheelchair ADL Screening (condition at time of admission) Patient's cognitive ability adequate to safely complete daily activities?: No Is the patient deaf or have difficulty hearing?: No Does the patient have difficulty seeing, even when wearing glasses/contacts?: Yes Does the patient have difficulty concentrating, remembering, or making decisions?: Yes Patient able to express need for assistance with ADLs?: No Does the patient have difficulty dressing or bathing?: Yes Independently performs ADLs?: No Communication: Dependent Is this a change from baseline?: Pre-admission  baseline Dressing (OT): Dependent Is this a change from baseline?: Pre-admission baseline Grooming: Needs assistance Is this a change from baseline?: Pre-admission baseline Feeding: Dependent Is this a change from baseline?: Pre-admission baseline Bathing: Needs assistance Is this a change from baseline?: Pre-admission baseline Toileting: Dependent Is this a change from baseline?: Pre-admission baseline In/Out Bed: Dependent Is this a change from baseline?: Pre-admission baseline Walks in Home: Dependent Is this a change from baseline?: Pre-admission baseline Does the patient have difficulty walking or climbing stairs?: Yes Weakness of Legs: Both Weakness of Arms/Hands: Both  Permission Sought/Granted                  Emotional Assessment              Admission diagnosis:  Wound infection [T14.8XXA, L08.9] Unresponsive episode [R41.89] Patient Active Problem List   Diagnosis Date Noted  . Sepsis ([place]) 05/24/2018  . Shingles 05/24/2018  . Acute kidney injury ([place])   . Acute renal failure syndrome ([place])   . Sacral decubitus ulcer   . CVA (cerebral vascular accident) ([place])   . Pulmonary hypertension ([place])   . Metabolic acidosis   . Midline low back pain without sciatica   . Altered mental status   . Hypothermia   . Hyperkalemia   . Diabetes type 2, controlled ([place])   . SIRS (systemic inflammatory response syndrome) ([place]) 05/01/2014  . Acute encephalopathy 05/01/2014  . Fungal rash of trunk 05/01/2014  . Heel spur 05/04/2013  . Weakness 04/17/2013  . Anemia of other chronic disease 06/10/2012  . Essential hypertension, benign 06/03/2012  .  Type II or unspecified type diabetes mellitus with neurological manifestations, not stated as uncontrolled(250.60) 06/03/2012  . Abnormality of gait 05/28/2012  . Type I (juvenile type) diabetes mellitus with peripheral circulatory disorders, not stated as uncontrolled(250.71) 05/28/2012  . Unspecified hereditary and  idiopathic peripheral neuropathy 05/28/2012  . Unspecified late effects of cerebrovascular disease 05/28/2012  . Weakness generalized 05/11/2012  . Blind left eye 12/11/2011  . Neuropathy 12/11/2011  . Elevated LFTs 11/12/2011  . Hyperlipidemia 03/26/2011  . CVA (cerebral infarction) 03/26/2011   PCP:  Caren Macadam, MD Pharmacy:   [id number] - [place], [place] [address] Phone: [phone] Fax: [phone]     Social Determinants of Health (SDOH) Interventions    Readmission Risk Interventions No flowsheet data found.

## 2018-07-14 NOTE — Care Management Obs Status (Signed)
[place] NOTIFICATION   Patient Details  Name: Curtis Brock. MRN: [id number] Date of Birth: October 06, 1925   Medicare Observation Status Notification Given:  Yes    Pollie Friar, RN 07/14/2018, 1:47 PM

## 2018-07-14 NOTE — Progress Notes (Signed)
Pharmacy Antibiotic Note  Curtis Brock. is a 85 y.o. male admitted on 07/13/2018 with sepsis.  Pharmacy has been consulted for vancomycin/cefepime dosing - day #2. Afebrile, WBC wnl, LA 2.7>1.3. SCr 0.61 stable, weight updated to 65.1 kg.  Plan: Cefepime 2g IV q12h Change vancomycin to 1250 mg IV Q 24 hrs. Goal AUC 400-550. Expected AUC: 530 SCr used: 0.8 Monitor clinical progress, c/s, renal function F/u de-escalation plan/LOT, vancomycin levels as indicated    Weight: 143 lb 8.3 oz (65.1 kg)  Temp (24hrs), Avg:97.4 F (36.3 C), Min:97.2 F (36.2 C), Max:97.7 F (36.5 C)  Recent Labs  Lab 07/13/18 1135 07/13/18 1404 07/13/18 1500 07/14/18 0430  WBC 7.9  --   --  7.5  CREATININE  --   --  0.75 0.61  LATICACIDVEN 2.7* 1.3  --   --     Estimated Creatinine Clearance: 55.4 mL/min (by C-G formula based on SCr of 0.61 mg/dL).    No Known Allergies  Antimicrobials this admission: 6/29 vancomycin >>  6/29 cefepime >>  6/29 Flagyl x 1  Microbiology results: 6/30 mrsa pcr + 6/29 UC -  6/29 BCx - ngtd 6/29 covid - neg   Elicia Lamp, PharmD, BCPS Clinical Pharmacist 07/14/2018 8:52 AM

## 2018-07-14 NOTE — TOC Transition Note (Signed)
Transition of Care [facility]) - CM/SW Discharge Note   Patient Details  Name: Curtis Brock. MRN: [id number] Date of Birth: 24-Oct-1925  Transition of Care [facility]) CM/SW Contact:  Pollie Friar, RN Phone Number: 07/14/2018, 5:20 PM   Clinical Narrative:    TOC spoke to daughter and she asked about a wheelchair that was ordered when pt was d/ced from rehab through [facility] that they have not received. TOC called [employer] and they d/ced the order for the wheelchair since they never got the information from the rehab they needed.  Wheelchair ordered and [employer] to deliver to the home.  Pt discharging home with self care and his daughter. She had requested [facility] home when he is ready. [facility] notified and bedside RN aware. Transport forms at the desk.  Message left for daughter about [facility] being arranged.    Final next level of care: Home/Self Care Barriers to Discharge: No Barriers Identified   Patient Goals and CMS Choice        Discharge Placement                       Discharge Plan and Services   Discharge Planning Services: CM Consult            DME Arranged: Wheelchair manual DME Agency: [employer] Date DME Agency Contacted: 07/14/18   Representative spoke with at DME Agency: Zack            Social Determinants of Health ([place]) Interventions     Readmission Risk Interventions No flowsheet data found.

## 2018-07-14 NOTE — Progress Notes (Signed)
EEG complete - results pending 

## 2018-07-14 NOTE — Procedures (Signed)
ELECTROENCEPHALOGRAM REPORT   Patient: Curtis Brock.       Room #: 3W04C EEG No. ID: [id number] Age: 85 y.o.        Sex: male Referring Physician: Ree Kida Report Date:  07/14/2018        Interpreting Physician: Alexis Goodell  History: Curtis Brock. is an 85 y.o. male with altered mental status  Medications:  Cefepime, ASA, Insulin, MVI, Vancomycin  Conditions of Recording:  This is a 21 channel routine scalp EEG performed with bipolar and monopolar montages arranged in accordance to the international 10/20 system of electrode placement. One channel was dedicated to EKG recording.  The patient is in the awake and uncooperative state.  Description:  Artifact is prominent during the recording often obscuring the background rhythm. When able to be visualized the posterior background rhythm can only be evaluated briefly and is estimated at a 6-7 Hz theta activity.  A mixture of poorly organized frequencies are noted otherwise.  The patient does not drowse or sleep.   No epileptiform activity is noted.   Hyperventilation was not performed.  Intermittent photic stimulation was performed but failed to illicit any change in the tracing.   IMPRESSION: This is a technically difficult recording due to the prominence of muscle and movement artifact.  Posterior background rhythm appears to be slow, consistent with dementia.  No epileptiform activity is noted.     Alexis Goodell, MD Neurology [phone] 07/14/2018, 12:33 PM

## 2018-07-14 NOTE — Plan of Care (Signed)
Progressing towards goals

## 2018-07-14 NOTE — Consult Note (Signed)
[place] Nurse wound consult note Reason for Consult:Stage 3 pressure injury to sacrum. Moisture and pressure.  Wound type:moisture and pressure.  Pressure Injury POA: Yes Measurement: 2 cmx 2 cm x0.3 cm  Wound JKK:XFGHWE slough Drainage (amount, consistency, odor) minimal serosanguinous Periwound:intact Dressing procedure/placement/frequency:Cleanse sacral wound with NS and pat dry.  Apply Santyl to wound bed. Cover with NS moist gauze.  Secure with ABD pad and tape.  Change daily.  Will not follow at this time.  Please re-consult if needed.  Domenic Moras MSN, RN, FNP-BC CWON Wound, Ostomy, Continence Nurse Pager [phone]

## 2018-07-15 LAB — FOLATE RBC
Folate, Hemolysate: 309 ng/mL
Folate, RBC: 942 ng/mL (ref >498)
Hematocrit: 32.8 % — ABNORMAL LOW (ref 37.5–51.0)

## 2018-07-15 LAB — CULTURE, BLOOD (ROUTINE X 2)

## 2018-07-17 LAB — VITAMIN B1: Vitamin B1 (Thiamine): 142.5 nmol/L (ref 66.5–200.0)

## 2018-07-18 LAB — CULTURE, BLOOD (ROUTINE X 2): Culture: NO GROWTH

## 2018-08-03 ENCOUNTER — Encounter (HOSPITAL_BASED_OUTPATIENT_CLINIC_OR_DEPARTMENT_OTHER): Payer: Medicare HMO | Attending: Internal Medicine

## 2018-08-03 DIAGNOSIS — L89154 Pressure ulcer of sacral region, stage 4: Secondary | ICD-10-CM | POA: Diagnosis not present

## 2018-08-03 DIAGNOSIS — Z6823 Body mass index (BMI) 23.0-23.9, adult: Secondary | ICD-10-CM | POA: Diagnosis not present

## 2018-08-03 DIAGNOSIS — G609 Hereditary and idiopathic neuropathy, unspecified: Secondary | ICD-10-CM | POA: Diagnosis not present

## 2018-08-03 DIAGNOSIS — F0150 Vascular dementia without behavioral disturbance: Secondary | ICD-10-CM | POA: Insufficient documentation

## 2018-08-03 DIAGNOSIS — I10 Essential (primary) hypertension: Secondary | ICD-10-CM | POA: Diagnosis not present

## 2018-08-03 DIAGNOSIS — E43 Unspecified severe protein-calorie malnutrition: Secondary | ICD-10-CM | POA: Insufficient documentation

## 2018-08-31 ENCOUNTER — Other Ambulatory Visit (HOSPITAL_COMMUNITY)
Admission: RE | Admit: 2018-08-31 | Discharge: 2018-08-31 | Disposition: A | Discharge status: Home / Self Care | Payer: Medicare HMO | Source: Other Acute Inpatient Hospital | Attending: Internal Medicine | Admitting: Internal Medicine

## 2018-08-31 ENCOUNTER — Encounter (HOSPITAL_BASED_OUTPATIENT_CLINIC_OR_DEPARTMENT_OTHER): Payer: Medicare HMO | Attending: Internal Medicine

## 2018-08-31 ENCOUNTER — Other Ambulatory Visit (HOSPITAL_BASED_OUTPATIENT_CLINIC_OR_DEPARTMENT_OTHER): Payer: Self-pay | Admitting: Internal Medicine

## 2018-08-31 DIAGNOSIS — E1122 Type 2 diabetes mellitus with diabetic chronic kidney disease: Secondary | ICD-10-CM | POA: Insufficient documentation

## 2018-08-31 DIAGNOSIS — N186 End stage renal disease: Secondary | ICD-10-CM | POA: Insufficient documentation

## 2018-08-31 DIAGNOSIS — B9620 Unspecified Escherichia coli [E. coli] as the cause of diseases classified elsewhere: Secondary | ICD-10-CM | POA: Diagnosis not present

## 2018-08-31 DIAGNOSIS — B9562 Methicillin resistant Staphylococcus aureus infection as the cause of diseases classified elsewhere: Secondary | ICD-10-CM | POA: Insufficient documentation

## 2018-08-31 DIAGNOSIS — L89312 Pressure ulcer of right buttock, stage 2: Secondary | ICD-10-CM | POA: Insufficient documentation

## 2018-08-31 DIAGNOSIS — E1169 Type 2 diabetes mellitus with other specified complication: Secondary | ICD-10-CM | POA: Insufficient documentation

## 2018-08-31 DIAGNOSIS — L89154 Pressure ulcer of sacral region, stage 4: Secondary | ICD-10-CM | POA: Diagnosis not present

## 2018-08-31 DIAGNOSIS — E1140 Type 2 diabetes mellitus with diabetic neuropathy, unspecified: Secondary | ICD-10-CM | POA: Insufficient documentation

## 2018-08-31 DIAGNOSIS — M8618 Other acute osteomyelitis, other site: Secondary | ICD-10-CM | POA: Diagnosis not present

## 2018-08-31 DIAGNOSIS — E43 Unspecified severe protein-calorie malnutrition: Secondary | ICD-10-CM | POA: Insufficient documentation

## 2018-08-31 DIAGNOSIS — F0150 Vascular dementia without behavioral disturbance: Secondary | ICD-10-CM | POA: Diagnosis not present

## 2018-08-31 DIAGNOSIS — B964 Proteus (mirabilis) (morganii) as the cause of diseases classified elsewhere: Secondary | ICD-10-CM | POA: Diagnosis not present

## 2018-08-31 DIAGNOSIS — I120 Hypertensive chronic kidney disease with stage 5 chronic kidney disease or end stage renal disease: Secondary | ICD-10-CM | POA: Diagnosis not present

## 2018-09-05 LAB — AEROBIC CULTURE W GRAM STAIN (SUPERFICIAL SPECIMEN)

## 2018-09-07 DIAGNOSIS — L89154 Pressure ulcer of sacral region, stage 4: Secondary | ICD-10-CM | POA: Diagnosis not present

## 2018-10-15 DEATH — deceased

## 2020-07-01 IMAGING — CR PORTABLE CHEST - 1 VIEW
1 series · 1 of 1 positions shown · non-contrast
Comparison: 05/24/2018

CLINICAL DATA: Altered mental status

EXAM:
PORTABLE CHEST 1 VIEW

[AP]
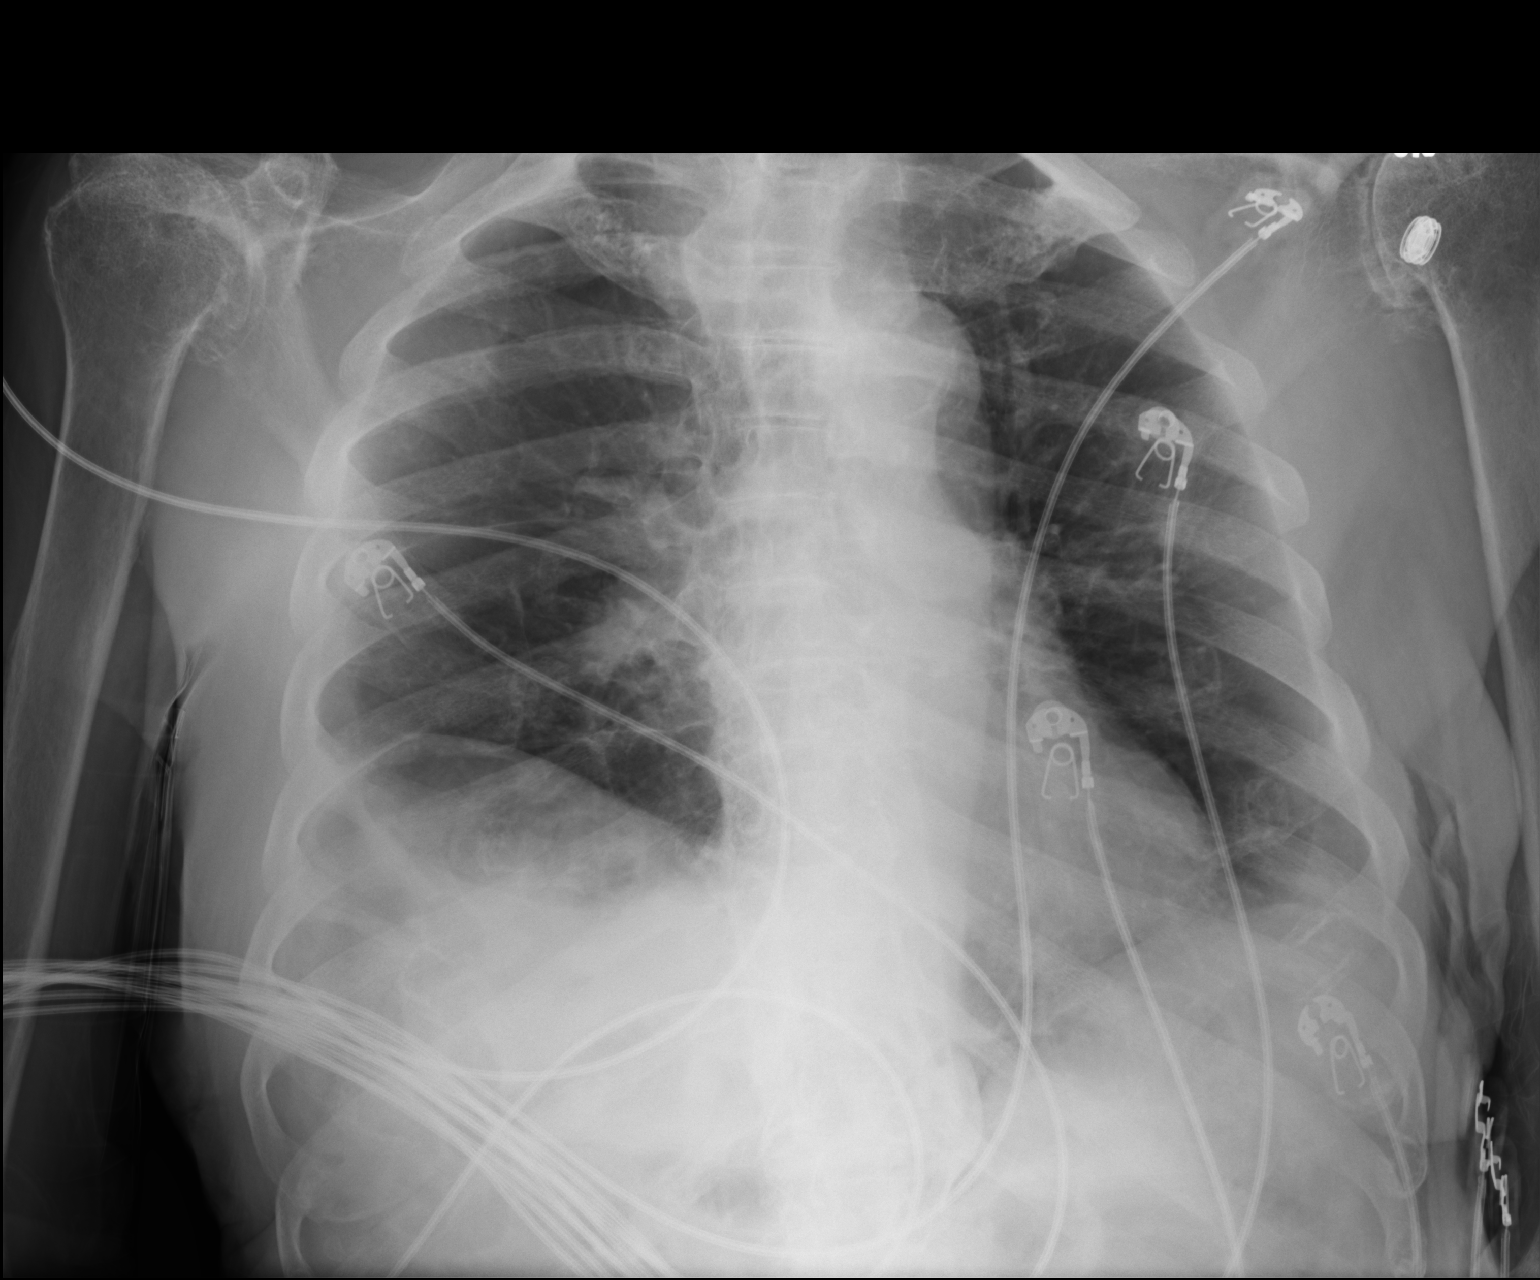

[1 of 1 positions shown; findings below may reference images not displayed]

FINDINGS: Cardiac shadow is within normal limits. Aortic calcifications are
again identified. Elevation of the right hemidiaphragm is seen.
Minimal blunting of the costophrenic angle on the right is noted
likely related to the elevated hemidiaphragm. No focal infiltrate or
effusion is seen. No acute bony abnormality is noted.
IMPRESSION: No acute abnormality noted.

## 2020-07-02 IMAGING — MR MRI HEAD WITHOUT CONTRAST
10 of 11 series · 42 of 48 positions shown · non-contrast
Comparison: Head CT 07/13/2018 and MRI 10/16/2016

CLINICAL DATA: Encephalopathy.

EXAM:
MRI HEAD WITHOUT CONTRAST
TECHNIQUE: Multiplanar, multiecho pulse sequences of the brain and surrounding
structures were obtained without intravenous contrast.

[Series 5: DWI · axial · 3.0mm · 0.88mm/px · z∈[-59,+78]mm · 9 of 94 slices shown (1 of 4)]
[im 1/94]
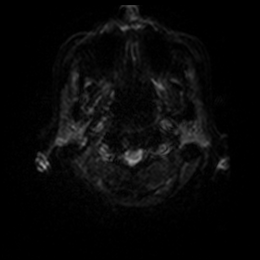
[im 12/94]
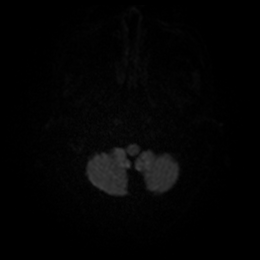
[im 24/94]
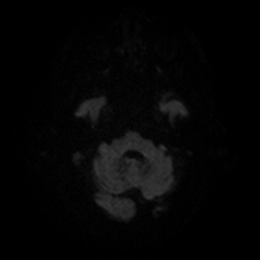
[im 35/94]
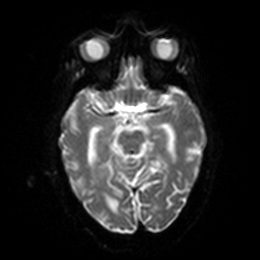
[im 47/94]
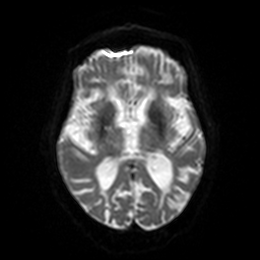
[im 59/94]
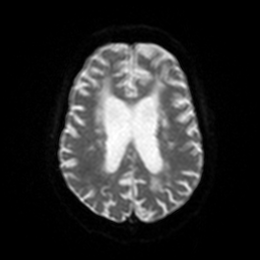
[im 70/94]
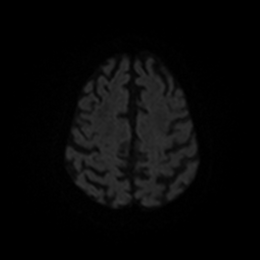
[im 82/94]
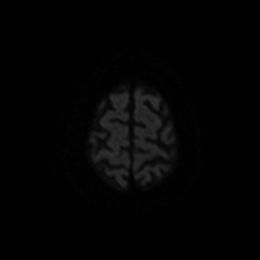
[im 94/94]
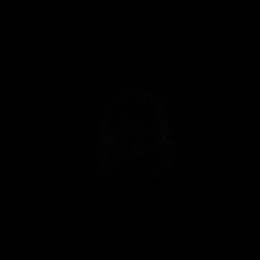

[Series 6: DWI · axial · 3.0mm · 0.88mm/px · z∈[-59,+78]mm · 4 of 47 slices shown (2 of 4)]
[im 1/47]
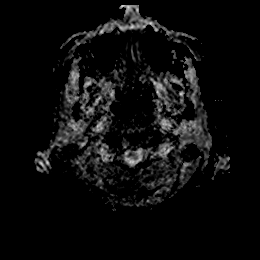
[im 16/47]
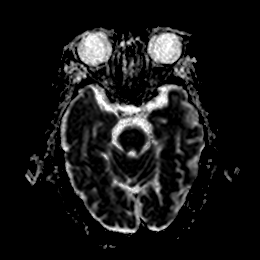
[im 31/47]
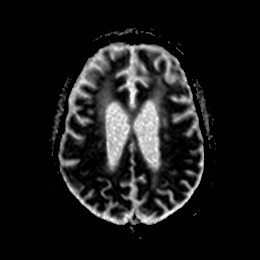
[im 47/47]
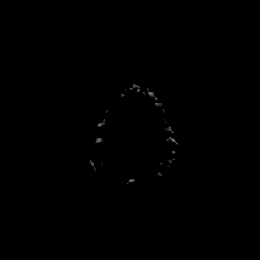

[Series 7: DWI · coronal · 4.0mm · 0.88mm/px · 6 of 64 slices shown (3 of 4)]
[im 1/64]
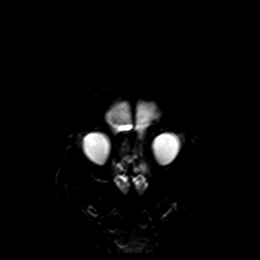
[im 13/64]
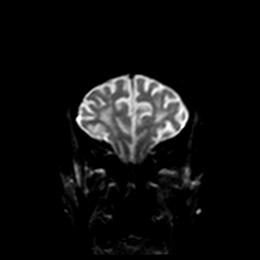
[im 26/64]
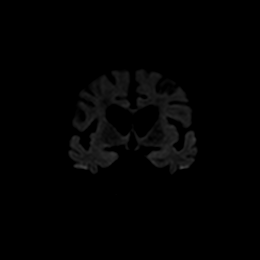
[im 38/64]
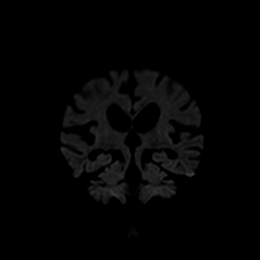
[im 51/64]
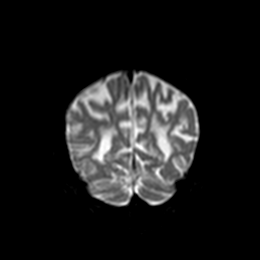
[im 64/64]
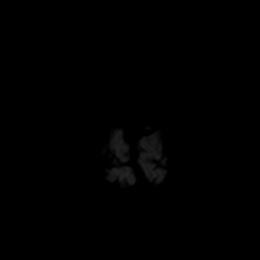

[Series 8: DWI · coronal · 4.0mm · 0.88mm/px · 3 of 32 slices shown (4 of 4)]
[im 1/32]
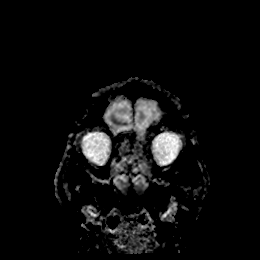
[im 16/32]
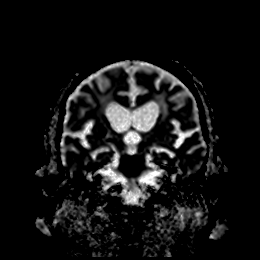
[im 32/32]
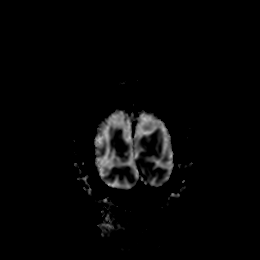

[Series 9: T2 · axial · 5.0mm · 0.72mm/px · z∈[-65,+78]mm · 2 of 25 slices shown (1 of 2)]
[im 1/25]
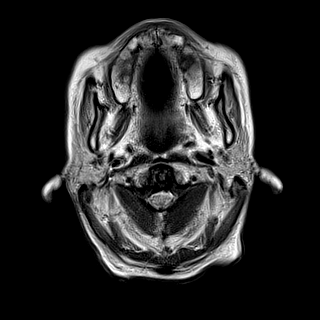
[im 25/25]
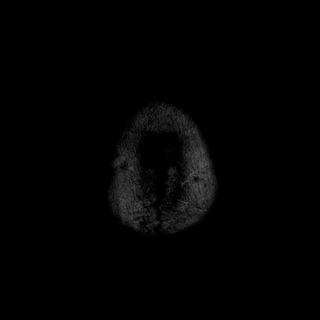

[Series 10: FLAIR · axial · 5.0mm · 0.45mm/px · z∈[-65,+79]mm · 2 of 25 slices shown]
[im 1/25]
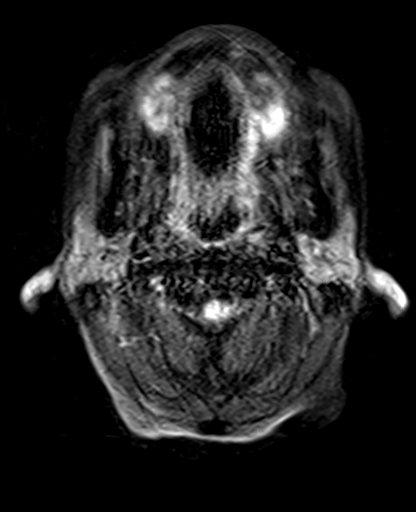
[im 25/25]
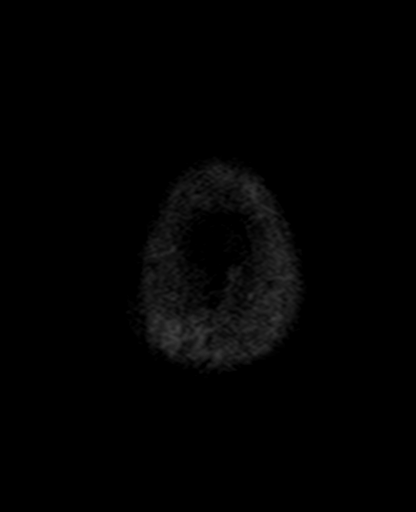

[Series 12: pha_images · axial · 3.0mm · 0.90mm/px · z∈[-65,+85]mm · 5 of 50 slices shown]
[im 1/50]
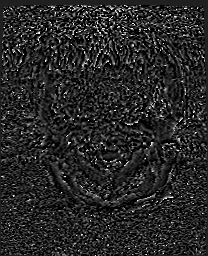
[im 13/50]
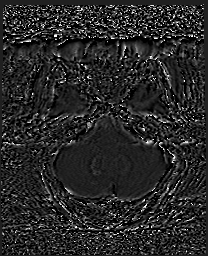
[im 25/50]
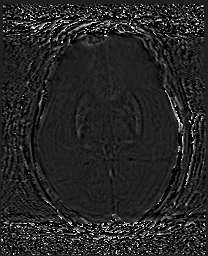
[im 37/50]
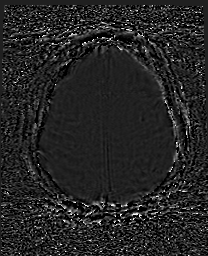
[im 50/50]
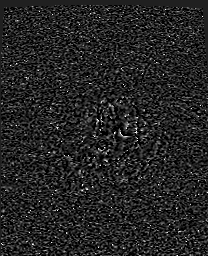

[Series 13: swi_images · axial · 3.0mm · 0.90mm/px · z∈[-74,+100]mm · 6 of 58 slices shown]
[im 1/58]
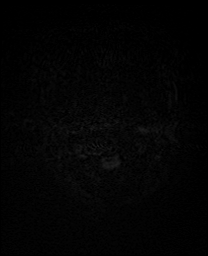
[im 12/58]
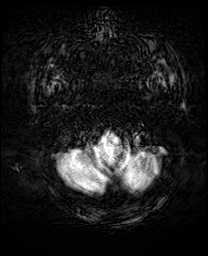
[im 23/58]
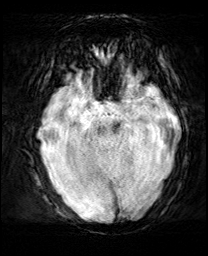
[im 35/58]
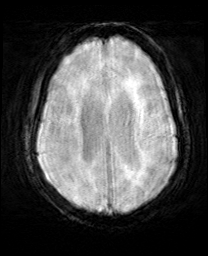
[im 46/58]
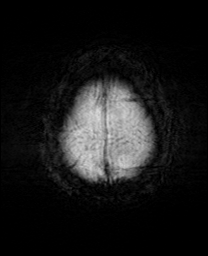
[im 58/58]
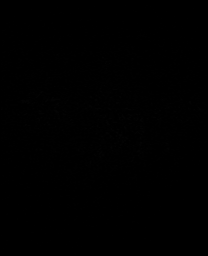

[Series 16: T2 · coronal · 5.0mm · 0.72mm/px · 3 of 28 slices shown (2 of 2)]
[im 1/28]
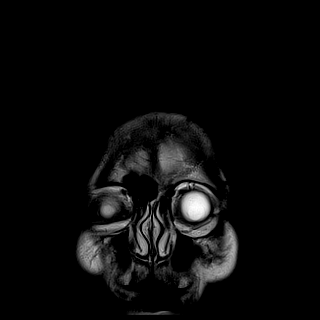
[im 14/28]
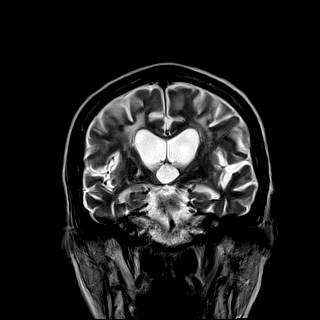
[im 28/28]
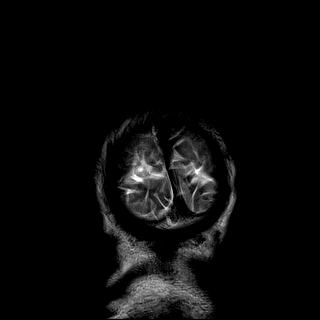

[Series 17: T1 · sagittal · 5.0mm · 0.75mm/px · 2 of 23 slices shown]
[im 1/23]
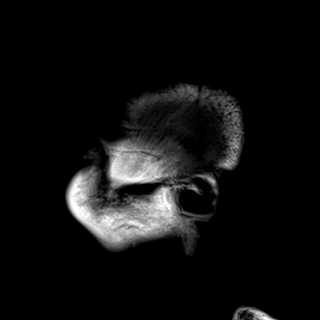
[im 23/23]
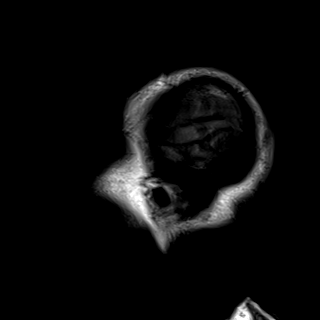

[42 of 48 positions shown; findings below may reference images not displayed]

FINDINGS: Multiple sequences are moderately motion degraded.

Brain: There is no evidence of acute infarct, mass, midline shift,
or extra-axial fluid collection. A chronic microhemorrhage in the
right cerebellum is new from the prior MRI and nonspecific in
isolation. Small chronic infarcts are again seen in the cerebellum,
right thalamus, right internal capsule, and bilateral deep cerebral
white matter. Additional confluent T2 hyperintensity in the cerebral
white matter bilaterally is similar to the prior MRI and nonspecific
but compatible with severe chronic small vessel ischemic disease.
Advanced cerebral atrophy is again noted.

Vascular: Major intracranial vascular flow voids are preserved.

Skull and upper cervical spine: No suspicious marrow lesion.

Sinuses/Orbits: Bilateral cataract extraction. Clear paranasal
sinuses. Small mastoid effusions.

Other: None.
IMPRESSION: 1. Motion degraded examination without evidence of acute
intracranial abnormality.
2. Advanced chronic small vessel ischemic disease and cerebral
atrophy.
